# Patient Record
Sex: Male | Born: 1935 | Race: White | Hispanic: No | Marital: Married | Smoking: Former smoker
Health system: Southern US, Community
[De-identification: ages and names within clinical notes are randomized; demographics above are authoritative.]

## PROBLEM LIST (undated history)

## (undated) DIAGNOSIS — K5790 Diverticulosis of intestine, part unspecified, without perforation or abscess without bleeding: Secondary | ICD-10-CM

## (undated) DIAGNOSIS — K635 Polyp of colon: Secondary | ICD-10-CM

## (undated) DIAGNOSIS — I4891 Unspecified atrial fibrillation: Secondary | ICD-10-CM

## (undated) DIAGNOSIS — I442 Atrioventricular block, complete: Secondary | ICD-10-CM

## (undated) DIAGNOSIS — R29818 Other symptoms and signs involving the nervous system: Secondary | ICD-10-CM

## (undated) DIAGNOSIS — I6529 Occlusion and stenosis of unspecified carotid artery: Secondary | ICD-10-CM

## (undated) DIAGNOSIS — R259 Unspecified abnormal involuntary movements: Secondary | ICD-10-CM

## (undated) DIAGNOSIS — R413 Other amnesia: Secondary | ICD-10-CM

## (undated) DIAGNOSIS — I2510 Atherosclerotic heart disease of native coronary artery without angina pectoris: Secondary | ICD-10-CM

## (undated) DIAGNOSIS — K449 Diaphragmatic hernia without obstruction or gangrene: Secondary | ICD-10-CM

## (undated) DIAGNOSIS — E7800 Pure hypercholesterolemia, unspecified: Secondary | ICD-10-CM

## (undated) DIAGNOSIS — N400 Enlarged prostate without lower urinary tract symptoms: Secondary | ICD-10-CM

## (undated) DIAGNOSIS — M1990 Unspecified osteoarthritis, unspecified site: Secondary | ICD-10-CM

## (undated) DIAGNOSIS — IMO0002 Reserved for concepts with insufficient information to code with codable children: Secondary | ICD-10-CM

## (undated) DIAGNOSIS — B029 Zoster without complications: Secondary | ICD-10-CM

## (undated) DIAGNOSIS — G4733 Obstructive sleep apnea (adult) (pediatric): Secondary | ICD-10-CM

## (undated) DIAGNOSIS — K2970 Gastritis, unspecified, without bleeding: Secondary | ICD-10-CM

## (undated) DIAGNOSIS — Z950 Presence of cardiac pacemaker: Secondary | ICD-10-CM

## (undated) DIAGNOSIS — H409 Unspecified glaucoma: Secondary | ICD-10-CM

## (undated) DIAGNOSIS — I255 Ischemic cardiomyopathy: Secondary | ICD-10-CM

## (undated) DIAGNOSIS — D649 Anemia, unspecified: Secondary | ICD-10-CM

## (undated) DIAGNOSIS — J189 Pneumonia, unspecified organism: Secondary | ICD-10-CM

## (undated) DIAGNOSIS — I10 Essential (primary) hypertension: Secondary | ICD-10-CM

## (undated) DIAGNOSIS — I679 Cerebrovascular disease, unspecified: Secondary | ICD-10-CM

## (undated) DIAGNOSIS — C07 Malignant neoplasm of parotid gland: Secondary | ICD-10-CM

## (undated) DIAGNOSIS — IMO0001 Reserved for inherently not codable concepts without codable children: Secondary | ICD-10-CM

## (undated) DIAGNOSIS — F419 Anxiety disorder, unspecified: Secondary | ICD-10-CM

## (undated) HISTORY — PX: HERNIA REPAIR: SHX51

## (undated) HISTORY — DX: Other symptoms and signs involving the nervous system: R29.818

## (undated) HISTORY — DX: Unspecified atrial fibrillation: I48.91

## (undated) HISTORY — PX: PACEMAKER INSERTION: SHX728

## (undated) HISTORY — DX: Cerebrovascular disease, unspecified: I67.9

## (undated) HISTORY — DX: Reserved for inherently not codable concepts without codable children: IMO0001

## (undated) HISTORY — DX: Anxiety disorder, unspecified: F41.9

## (undated) HISTORY — PX: INSERT / REPLACE / REMOVE PACEMAKER: SUR710

## (undated) HISTORY — DX: Obstructive sleep apnea (adult) (pediatric): G47.33

## (undated) HISTORY — DX: Occlusion and stenosis of unspecified carotid artery: I65.29

## (undated) HISTORY — DX: Diaphragmatic hernia without obstruction or gangrene: K44.9

## (undated) HISTORY — DX: Zoster without complications: B02.9

## (undated) HISTORY — DX: Benign prostatic hyperplasia without lower urinary tract symptoms: N40.0

## (undated) HISTORY — DX: Pure hypercholesterolemia, unspecified: E78.00

## (undated) HISTORY — DX: Unspecified abnormal involuntary movements: R25.9

## (undated) HISTORY — PX: HIATAL HERNIA REPAIR: SHX195

## (undated) HISTORY — DX: Anemia, unspecified: D64.9

## (undated) HISTORY — DX: Reserved for concepts with insufficient information to code with codable children: IMO0002

## (undated) HISTORY — DX: Diverticulosis of intestine, part unspecified, without perforation or abscess without bleeding: K57.90

## (undated) HISTORY — DX: Atrioventricular block, complete: I44.2

## (undated) HISTORY — DX: Gastritis, unspecified, without bleeding: K29.70

## (undated) HISTORY — DX: Atherosclerotic heart disease of native coronary artery without angina pectoris: I25.10

## (undated) HISTORY — DX: Ischemic cardiomyopathy: I25.5

## (undated) HISTORY — DX: Polyp of colon: K63.5

## (undated) HISTORY — DX: Other amnesia: R41.3

## (undated) HISTORY — DX: Presence of cardiac pacemaker: Z95.0

## (undated) HISTORY — DX: Unspecified glaucoma: H40.9

## (undated) HISTORY — DX: Unspecified osteoarthritis, unspecified site: M19.90

## (undated) HISTORY — PX: ANAL FISSURE REPAIR: SHX2312

---

## 1998-11-07 DIAGNOSIS — I2510 Atherosclerotic heart disease of native coronary artery without angina pectoris: Secondary | ICD-10-CM

## 1998-11-07 HISTORY — DX: Atherosclerotic heart disease of native coronary artery without angina pectoris: I25.10

## 1999-07-09 HISTORY — PX: CORONARY ARTERY BYPASS GRAFT: SHX141

## 1999-08-03 ENCOUNTER — Encounter: Payer: Self-pay | Admitting: Cardiology

## 1999-08-04 ENCOUNTER — Inpatient Hospital Stay (HOSPITAL_COMMUNITY): Admission: AD | Admit: 1999-08-04 | Discharge: 1999-08-12 | Payer: Self-pay | Admitting: Cardiology

## 1999-08-04 ENCOUNTER — Encounter: Payer: Self-pay | Admitting: Cardiology

## 1999-08-05 ENCOUNTER — Encounter: Payer: Self-pay | Admitting: Cardiology

## 1999-08-06 ENCOUNTER — Encounter: Payer: Self-pay | Admitting: Cardiothoracic Surgery

## 1999-08-07 ENCOUNTER — Encounter: Payer: Self-pay | Admitting: Cardiothoracic Surgery

## 1999-08-08 ENCOUNTER — Encounter: Payer: Self-pay | Admitting: Cardiothoracic Surgery

## 1999-10-25 ENCOUNTER — Inpatient Hospital Stay (HOSPITAL_COMMUNITY): Admission: RE | Admit: 1999-10-25 | Discharge: 1999-10-28 | Payer: Self-pay | Admitting: Internal Medicine

## 1999-10-26 ENCOUNTER — Encounter: Payer: Self-pay | Admitting: Internal Medicine

## 2000-02-02 ENCOUNTER — Other Ambulatory Visit: Admission: RE | Admit: 2000-02-02 | Discharge: 2000-02-02 | Payer: Self-pay | Admitting: Gastroenterology

## 2000-02-02 ENCOUNTER — Encounter (INDEPENDENT_AMBULATORY_CARE_PROVIDER_SITE_OTHER): Payer: Self-pay | Admitting: Specialist

## 2000-02-07 ENCOUNTER — Ambulatory Visit (HOSPITAL_COMMUNITY): Admission: RE | Admit: 2000-02-07 | Discharge: 2000-02-07 | Payer: Self-pay | Admitting: Gastroenterology

## 2000-02-07 ENCOUNTER — Encounter: Payer: Self-pay | Admitting: Gastroenterology

## 2000-05-11 ENCOUNTER — Ambulatory Visit (HOSPITAL_COMMUNITY): Admission: RE | Admit: 2000-05-11 | Discharge: 2000-05-11 | Payer: Self-pay | Admitting: Oncology

## 2002-04-04 ENCOUNTER — Ambulatory Visit (HOSPITAL_BASED_OUTPATIENT_CLINIC_OR_DEPARTMENT_OTHER): Admission: RE | Admit: 2002-04-04 | Discharge: 2002-04-04 | Payer: Self-pay | Admitting: Pulmonary Disease

## 2003-03-20 ENCOUNTER — Ambulatory Visit (HOSPITAL_COMMUNITY): Admission: RE | Admit: 2003-03-20 | Discharge: 2003-03-20 | Payer: Self-pay | Admitting: Pulmonary Disease

## 2003-03-20 ENCOUNTER — Encounter: Payer: Self-pay | Admitting: Pulmonary Disease

## 2003-04-16 ENCOUNTER — Encounter (HOSPITAL_COMMUNITY): Admission: RE | Admit: 2003-04-16 | Discharge: 2003-05-16 | Payer: Self-pay | Admitting: Neurosurgery

## 2003-06-19 ENCOUNTER — Ambulatory Visit (HOSPITAL_COMMUNITY): Admission: RE | Admit: 2003-06-19 | Discharge: 2003-06-19 | Payer: Self-pay | Admitting: *Deleted

## 2003-08-08 HISTORY — PX: CAROTID ENDARTERECTOMY: SUR193

## 2003-08-22 ENCOUNTER — Encounter: Payer: Self-pay | Admitting: Vascular Surgery

## 2003-08-26 ENCOUNTER — Inpatient Hospital Stay (HOSPITAL_COMMUNITY): Admission: RE | Admit: 2003-08-26 | Discharge: 2003-08-27 | Payer: Self-pay | Admitting: Vascular Surgery

## 2003-08-26 ENCOUNTER — Encounter (INDEPENDENT_AMBULATORY_CARE_PROVIDER_SITE_OTHER): Payer: Self-pay | Admitting: Specialist

## 2004-09-16 ENCOUNTER — Ambulatory Visit: Payer: Self-pay | Admitting: Cardiology

## 2004-10-06 ENCOUNTER — Ambulatory Visit: Payer: Self-pay

## 2004-10-14 ENCOUNTER — Ambulatory Visit: Payer: Self-pay | Admitting: Cardiology

## 2004-10-19 ENCOUNTER — Ambulatory Visit (HOSPITAL_COMMUNITY): Admission: RE | Admit: 2004-10-19 | Discharge: 2004-10-19 | Payer: Self-pay | Admitting: Internal Medicine

## 2004-10-19 ENCOUNTER — Ambulatory Visit: Payer: Self-pay | Admitting: Internal Medicine

## 2004-10-26 ENCOUNTER — Ambulatory Visit: Payer: Self-pay | Admitting: Cardiovascular Disease

## 2004-10-26 ENCOUNTER — Ambulatory Visit: Payer: Self-pay

## 2004-10-26 ENCOUNTER — Ambulatory Visit: Payer: Self-pay | Admitting: Internal Medicine

## 2004-11-09 ENCOUNTER — Ambulatory Visit: Payer: Self-pay | Admitting: Pulmonary Disease

## 2004-11-16 ENCOUNTER — Ambulatory Visit: Payer: Self-pay | Admitting: Cardiology

## 2004-12-07 ENCOUNTER — Ambulatory Visit: Payer: Self-pay | Admitting: Cardiology

## 2004-12-10 ENCOUNTER — Ambulatory Visit: Payer: Self-pay | Admitting: Oncology

## 2004-12-15 ENCOUNTER — Ambulatory Visit: Payer: Self-pay

## 2005-01-04 ENCOUNTER — Ambulatory Visit: Payer: Self-pay | Admitting: *Deleted

## 2005-01-12 ENCOUNTER — Ambulatory Visit: Payer: Self-pay | Admitting: Internal Medicine

## 2005-01-13 ENCOUNTER — Ambulatory Visit: Payer: Self-pay | Admitting: Pulmonary Disease

## 2005-01-18 ENCOUNTER — Ambulatory Visit: Payer: Self-pay | Admitting: Pulmonary Disease

## 2005-02-01 ENCOUNTER — Ambulatory Visit: Payer: Self-pay | Admitting: Cardiology

## 2005-03-02 ENCOUNTER — Ambulatory Visit: Payer: Self-pay | Admitting: Cardiology

## 2005-03-30 ENCOUNTER — Ambulatory Visit: Payer: Self-pay | Admitting: *Deleted

## 2005-04-08 ENCOUNTER — Ambulatory Visit: Payer: Self-pay | Admitting: Oncology

## 2005-04-27 ENCOUNTER — Ambulatory Visit: Payer: Self-pay | Admitting: *Deleted

## 2005-05-19 ENCOUNTER — Ambulatory Visit: Payer: Self-pay | Admitting: Cardiology

## 2005-06-16 ENCOUNTER — Ambulatory Visit: Payer: Self-pay | Admitting: Internal Medicine

## 2005-07-07 ENCOUNTER — Ambulatory Visit: Payer: Self-pay | Admitting: Internal Medicine

## 2005-07-12 ENCOUNTER — Ambulatory Visit: Payer: Self-pay | Admitting: Pulmonary Disease

## 2005-07-21 ENCOUNTER — Ambulatory Visit: Payer: Self-pay | Admitting: Internal Medicine

## 2005-07-26 ENCOUNTER — Ambulatory Visit: Payer: Self-pay | Admitting: Gastroenterology

## 2005-08-03 ENCOUNTER — Ambulatory Visit: Payer: Self-pay | Admitting: Gastroenterology

## 2005-08-05 ENCOUNTER — Ambulatory Visit: Payer: Self-pay | Admitting: Oncology

## 2005-08-11 ENCOUNTER — Ambulatory Visit: Payer: Self-pay | Admitting: Gastroenterology

## 2005-08-11 ENCOUNTER — Ambulatory Visit (HOSPITAL_COMMUNITY): Admission: RE | Admit: 2005-08-11 | Discharge: 2005-08-11 | Payer: Self-pay | Admitting: Gastroenterology

## 2005-08-18 ENCOUNTER — Ambulatory Visit: Payer: Self-pay | Admitting: Cardiology

## 2005-08-30 ENCOUNTER — Ambulatory Visit: Payer: Self-pay | Admitting: Internal Medicine

## 2005-09-20 ENCOUNTER — Ambulatory Visit: Payer: Self-pay | Admitting: Internal Medicine

## 2005-09-27 ENCOUNTER — Ambulatory Visit (HOSPITAL_COMMUNITY): Admission: RE | Admit: 2005-09-27 | Discharge: 2005-09-27 | Payer: Self-pay | Admitting: Adult Health

## 2005-09-27 ENCOUNTER — Ambulatory Visit: Payer: Self-pay | Admitting: Pulmonary Disease

## 2005-10-11 ENCOUNTER — Ambulatory Visit: Payer: Self-pay | Admitting: Cardiology

## 2005-11-08 ENCOUNTER — Ambulatory Visit: Payer: Self-pay | Admitting: *Deleted

## 2005-12-06 ENCOUNTER — Ambulatory Visit: Payer: Self-pay | Admitting: Cardiology

## 2005-12-27 ENCOUNTER — Ambulatory Visit: Payer: Self-pay | Admitting: Cardiovascular Disease

## 2006-01-11 ENCOUNTER — Ambulatory Visit: Payer: Self-pay | Admitting: Pulmonary Disease

## 2006-01-12 ENCOUNTER — Ambulatory Visit: Payer: Self-pay | Admitting: Internal Medicine

## 2006-01-24 ENCOUNTER — Ambulatory Visit: Payer: Self-pay | Admitting: Cardiology

## 2006-02-03 ENCOUNTER — Ambulatory Visit: Payer: Self-pay | Admitting: Oncology

## 2006-02-08 ENCOUNTER — Ambulatory Visit: Payer: Self-pay | Admitting: Cardiology

## 2006-02-13 ENCOUNTER — Ambulatory Visit: Payer: Self-pay | Admitting: Gastroenterology

## 2006-02-16 ENCOUNTER — Ambulatory Visit: Admission: RE | Admit: 2006-02-16 | Discharge: 2006-02-16 | Payer: Self-pay | Admitting: Gastroenterology

## 2006-02-21 ENCOUNTER — Ambulatory Visit: Payer: Self-pay | Admitting: Cardiology

## 2006-03-09 ENCOUNTER — Encounter (INDEPENDENT_AMBULATORY_CARE_PROVIDER_SITE_OTHER): Payer: Self-pay | Admitting: *Deleted

## 2006-03-09 ENCOUNTER — Ambulatory Visit (HOSPITAL_COMMUNITY): Admission: RE | Admit: 2006-03-09 | Discharge: 2006-03-09 | Payer: Self-pay | Admitting: Gastroenterology

## 2006-03-15 ENCOUNTER — Ambulatory Visit: Payer: Self-pay | Admitting: Gastroenterology

## 2006-03-21 ENCOUNTER — Ambulatory Visit: Payer: Self-pay | Admitting: Cardiology

## 2006-03-28 ENCOUNTER — Ambulatory Visit: Payer: Self-pay | Admitting: *Deleted

## 2006-04-04 ENCOUNTER — Ambulatory Visit: Payer: Self-pay | Admitting: Gastroenterology

## 2006-04-07 ENCOUNTER — Ambulatory Visit: Payer: Self-pay | Admitting: Cardiology

## 2006-04-14 ENCOUNTER — Ambulatory Visit: Payer: Self-pay | Admitting: Internal Medicine

## 2006-04-28 ENCOUNTER — Ambulatory Visit: Payer: Self-pay | Admitting: Cardiovascular Disease

## 2006-05-17 ENCOUNTER — Ambulatory Visit: Payer: Self-pay | Admitting: Pulmonary Disease

## 2006-05-26 ENCOUNTER — Ambulatory Visit: Payer: Self-pay | Admitting: Cardiology

## 2006-06-07 ENCOUNTER — Ambulatory Visit: Payer: Self-pay | Admitting: Internal Medicine

## 2006-06-15 ENCOUNTER — Ambulatory Visit: Payer: Self-pay

## 2006-06-23 ENCOUNTER — Ambulatory Visit: Payer: Self-pay | Admitting: *Deleted

## 2006-06-28 ENCOUNTER — Ambulatory Visit (HOSPITAL_COMMUNITY): Admission: RE | Admit: 2006-06-28 | Discharge: 2006-06-28 | Payer: Self-pay | Admitting: Internal Medicine

## 2006-06-28 ENCOUNTER — Ambulatory Visit: Payer: Self-pay | Admitting: Internal Medicine

## 2006-07-07 ENCOUNTER — Ambulatory Visit: Payer: Self-pay

## 2006-07-27 ENCOUNTER — Ambulatory Visit: Payer: Self-pay | Admitting: Oncology

## 2006-07-28 ENCOUNTER — Ambulatory Visit: Payer: Self-pay | Admitting: Cardiology

## 2006-07-31 LAB — CBC WITH DIFFERENTIAL/PLATELET
BASO%: 0.4 % (ref 0.0–2.0)
LYMPH%: 25.9 % (ref 14.0–48.0)
MCHC: 34.5 g/dL (ref 32.0–35.9)
MONO#: 0.4 10*3/uL (ref 0.1–0.9)
MONO%: 7.3 % (ref 0.0–13.0)
NEUT#: 3.5 10*3/uL (ref 1.5–6.5)
Platelets: 179 10*3/uL (ref 145–400)
RBC: 4.49 10*6/uL (ref 4.20–5.71)
RDW: 12.8 % (ref 11.2–14.6)
WBC: 5.4 10*3/uL (ref 4.0–10.0)

## 2006-07-31 LAB — FERRITIN: Ferritin: 244 ng/mL (ref 22–322)

## 2006-07-31 LAB — IRON AND TIBC
Iron: 119 ug/dL (ref 42–165)
TIBC: 314 ug/dL (ref 215–435)

## 2006-08-28 ENCOUNTER — Ambulatory Visit: Payer: Self-pay | Admitting: Cardiology

## 2006-09-15 ENCOUNTER — Ambulatory Visit: Payer: Self-pay | Admitting: Cardiology

## 2006-09-20 ENCOUNTER — Ambulatory Visit: Payer: Self-pay | Admitting: Pulmonary Disease

## 2006-10-05 ENCOUNTER — Ambulatory Visit: Payer: Self-pay | Admitting: Pulmonary Disease

## 2006-10-11 ENCOUNTER — Ambulatory Visit: Payer: Self-pay | Admitting: Pulmonary Disease

## 2006-11-06 ENCOUNTER — Ambulatory Visit: Payer: Self-pay | Admitting: Internal Medicine

## 2007-01-01 ENCOUNTER — Ambulatory Visit: Payer: Self-pay | Admitting: Internal Medicine

## 2007-01-19 ENCOUNTER — Ambulatory Visit: Payer: Self-pay | Admitting: Pulmonary Disease

## 2007-01-19 LAB — CONVERTED CEMR LAB
Albumin: 3.9 g/dL (ref 3.5–5.2)
Alkaline Phosphatase: 91 units/L (ref 39–117)
BUN: 14 mg/dL (ref 6–23)
Basophils Absolute: 0 10*3/uL (ref 0.0–0.1)
Cholesterol: 175 mg/dL (ref 0–200)
GFR calc Af Amer: 106 mL/min
GFR calc non Af Amer: 88 mL/min
HDL: 44 mg/dL (ref >39.0)
Hemoglobin: 14.5 g/dL (ref 13.0–17.0)
LDL Cholesterol: 95 mg/dL (ref 0–99)
Lymphocytes Relative: 30.4 % (ref 12.0–46.0)
MCHC: 33.6 g/dL (ref 30.0–36.0)
Monocytes Absolute: 0.7 10*3/uL (ref 0.2–0.7)
Monocytes Relative: 9.7 % (ref 3.0–11.0)
Neutro Abs: 3.9 10*3/uL (ref 1.4–7.7)
Potassium: 4.1 meq/L (ref 3.5–5.1)
Triglycerides: 182 mg/dL — ABNORMAL HIGH (ref 0–149)

## 2007-02-07 ENCOUNTER — Ambulatory Visit: Payer: Self-pay | Admitting: Oncology

## 2007-02-12 LAB — CBC WITH DIFFERENTIAL/PLATELET
BASO%: 1 % (ref 0.0–2.0)
Basophils Absolute: 0.1 10*3/uL (ref 0.0–0.1)
EOS%: 1.3 % (ref 0.0–7.0)
HCT: 43.2 % (ref 38.7–49.9)
HGB: 15 g/dL (ref 13.0–17.1)
LYMPH%: 28.8 % (ref 14.0–48.0)
MCH: 33.1 pg (ref 28.0–33.4)
MCHC: 34.8 g/dL (ref 32.0–35.9)
MCV: 95 fL (ref 81.6–98.0)
MONO%: 8.6 % (ref 0.0–13.0)
NEUT%: 60.3 % (ref 40.0–75.0)

## 2007-02-12 LAB — COMPREHENSIVE METABOLIC PANEL
ALT: 18 U/L (ref 0–53)
AST: 19 U/L (ref 0–37)
CO2: 26 mEq/L (ref 19–32)
Chloride: 102 mEq/L (ref 96–112)
Creatinine, Ser: 0.93 mg/dL (ref 0.40–1.50)
Sodium: 140 mEq/L (ref 135–145)
Total Bilirubin: 0.6 mg/dL (ref 0.3–1.2)
Total Protein: 6.8 g/dL (ref 6.0–8.3)

## 2007-02-12 LAB — IRON AND TIBC: %SAT: 47 % (ref 20–55)

## 2007-02-12 LAB — FERRITIN: Ferritin: 175 ng/mL (ref 22–322)

## 2007-02-16 ENCOUNTER — Ambulatory Visit: Payer: Self-pay | Admitting: Cardiology

## 2007-02-23 ENCOUNTER — Ambulatory Visit: Payer: Self-pay | Admitting: Internal Medicine

## 2007-04-09 ENCOUNTER — Ambulatory Visit: Payer: Self-pay | Admitting: Gastroenterology

## 2007-04-10 ENCOUNTER — Ambulatory Visit: Payer: Self-pay | Admitting: Gastroenterology

## 2007-04-15 ENCOUNTER — Emergency Department (HOSPITAL_COMMUNITY): Admission: EM | Admit: 2007-04-15 | Discharge: 2007-04-15 | Payer: Self-pay | Admitting: Emergency Medicine

## 2007-04-23 ENCOUNTER — Ambulatory Visit: Payer: Self-pay | Admitting: Internal Medicine

## 2007-04-30 ENCOUNTER — Ambulatory Visit: Payer: Self-pay | Admitting: Gastroenterology

## 2007-05-09 ENCOUNTER — Ambulatory Visit (HOSPITAL_COMMUNITY): Admission: RE | Admit: 2007-05-09 | Discharge: 2007-05-09 | Payer: Self-pay | Admitting: Gastroenterology

## 2007-05-15 ENCOUNTER — Ambulatory Visit: Payer: Self-pay | Admitting: Gastroenterology

## 2007-05-23 ENCOUNTER — Ambulatory Visit: Payer: Self-pay | Admitting: Pulmonary Disease

## 2007-05-23 LAB — CONVERTED CEMR LAB
AST: 25 units/L (ref 0–37)
Bilirubin, Direct: 0.1 mg/dL (ref 0.0–0.3)
Chloride: 103 meq/L (ref 96–112)
Cholesterol: 143 mg/dL (ref 0–200)
GFR calc Af Amer: 122 mL/min
GFR calc non Af Amer: 101 mL/min
Glucose, Bld: 113 mg/dL — ABNORMAL HIGH (ref 70–99)
HDL: 39.8 mg/dL (ref >39.0)
LDL Cholesterol: 69 mg/dL (ref 0–99)
Sodium: 141 meq/L (ref 135–145)
Total CHOL/HDL Ratio: 3.6
Triglycerides: 173 mg/dL — ABNORMAL HIGH (ref 0–149)

## 2007-05-28 ENCOUNTER — Ambulatory Visit: Payer: Self-pay | Admitting: Internal Medicine

## 2007-06-05 ENCOUNTER — Ambulatory Visit: Payer: Self-pay | Admitting: Gastroenterology

## 2007-06-12 ENCOUNTER — Ambulatory Visit: Payer: Self-pay | Admitting: Cardiology

## 2007-06-18 ENCOUNTER — Ambulatory Visit: Payer: Self-pay | Admitting: Internal Medicine

## 2007-08-09 ENCOUNTER — Ambulatory Visit: Payer: Self-pay | Admitting: Oncology

## 2007-08-13 ENCOUNTER — Ambulatory Visit: Payer: Self-pay | Admitting: Internal Medicine

## 2007-08-13 LAB — IRON AND TIBC
%SAT: 50 % (ref 20–55)
TIBC: 315 ug/dL (ref 215–435)
UIBC: 159 ug/dL

## 2007-08-13 LAB — FOLATE: Folate: >20 ng/mL

## 2007-08-13 LAB — CBC WITH DIFFERENTIAL/PLATELET
Basophils Absolute: 0 10*3/uL (ref 0.0–0.1)
EOS%: 1.1 % (ref 0.0–7.0)
Eosinophils Absolute: 0.1 10*3/uL (ref 0.0–0.5)
HCT: 40.7 % (ref 38.7–49.9)
HGB: 14.3 g/dL (ref 13.0–17.1)
MONO#: 0.6 10*3/uL (ref 0.1–0.9)
NEUT#: 3.9 10*3/uL (ref 1.5–6.5)
NEUT%: 60.3 % (ref 40.0–75.0)
RDW: 10.8 % — ABNORMAL LOW (ref 11.2–14.6)
WBC: 6.4 10*3/uL (ref 4.0–10.0)
lymph#: 1.9 10*3/uL (ref 0.9–3.3)

## 2007-08-13 LAB — FERRITIN: Ferritin: 192 ng/mL (ref 22–322)

## 2007-09-10 ENCOUNTER — Ambulatory Visit: Payer: Self-pay | Admitting: Pulmonary Disease

## 2007-09-10 LAB — CONVERTED CEMR LAB
Bilirubin Urine: NEGATIVE
Specific Gravity, Urine: 1.01 (ref 1.000–1.03)
Total Protein, Urine: 30 mg/dL — AB
Urine Glucose: NEGATIVE mg/dL
pH: 6.5 (ref 5.0–8.0)

## 2007-09-11 ENCOUNTER — Ambulatory Visit: Payer: Self-pay | Admitting: Cardiology

## 2007-09-24 DIAGNOSIS — I2510 Atherosclerotic heart disease of native coronary artery without angina pectoris: Secondary | ICD-10-CM | POA: Insufficient documentation

## 2007-09-24 DIAGNOSIS — M1990 Unspecified osteoarthritis, unspecified site: Secondary | ICD-10-CM | POA: Insufficient documentation

## 2007-09-24 DIAGNOSIS — E7800 Pure hypercholesterolemia, unspecified: Secondary | ICD-10-CM

## 2007-09-24 DIAGNOSIS — G4733 Obstructive sleep apnea (adult) (pediatric): Secondary | ICD-10-CM

## 2007-09-24 DIAGNOSIS — F411 Generalized anxiety disorder: Secondary | ICD-10-CM | POA: Insufficient documentation

## 2007-09-25 ENCOUNTER — Ambulatory Visit: Payer: Self-pay | Admitting: Pulmonary Disease

## 2007-10-08 ENCOUNTER — Ambulatory Visit: Payer: Self-pay | Admitting: Internal Medicine

## 2007-10-17 ENCOUNTER — Ambulatory Visit: Payer: Self-pay | Admitting: Internal Medicine

## 2007-10-17 ENCOUNTER — Ambulatory Visit: Payer: Self-pay | Admitting: Cardiovascular Disease

## 2007-10-23 ENCOUNTER — Ambulatory Visit: Payer: Self-pay | Admitting: Internal Medicine

## 2007-11-05 ENCOUNTER — Ambulatory Visit: Payer: Self-pay | Admitting: Internal Medicine

## 2007-11-06 ENCOUNTER — Ambulatory Visit: Payer: Self-pay | Admitting: Internal Medicine

## 2007-11-06 ENCOUNTER — Ambulatory Visit: Payer: Self-pay | Admitting: Pulmonary Disease

## 2007-11-07 ENCOUNTER — Ambulatory Visit: Payer: Self-pay | Admitting: Oncology

## 2007-11-08 DIAGNOSIS — K2990 Gastroduodenitis, unspecified, without bleeding: Secondary | ICD-10-CM

## 2007-11-08 DIAGNOSIS — D649 Anemia, unspecified: Secondary | ICD-10-CM

## 2007-11-08 DIAGNOSIS — K2970 Gastritis, unspecified, without bleeding: Secondary | ICD-10-CM | POA: Insufficient documentation

## 2007-11-08 DIAGNOSIS — K5730 Diverticulosis of large intestine without perforation or abscess without bleeding: Secondary | ICD-10-CM | POA: Insufficient documentation

## 2007-11-08 DIAGNOSIS — D126 Benign neoplasm of colon, unspecified: Secondary | ICD-10-CM | POA: Insufficient documentation

## 2007-11-12 LAB — CBC WITH DIFFERENTIAL/PLATELET
BASO%: 0.5 % (ref 0.0–2.0)
EOS%: 1.8 % (ref 0.0–7.0)
HCT: 43.5 % (ref 38.7–49.9)
LYMPH%: 31.6 % (ref 14.0–48.0)
MCH: 32.5 pg (ref 28.0–33.4)
MCHC: 34.2 g/dL (ref 32.0–35.9)
MONO#: 0.6 10*3/uL (ref 0.1–0.9)
NEUT%: 58.7 % (ref 40.0–75.0)
RBC: 4.58 10*6/uL (ref 4.20–5.71)
WBC: 7.7 10*3/uL (ref 4.0–10.0)
lymph#: 2.4 10*3/uL (ref 0.9–3.3)

## 2007-11-12 LAB — FERRITIN: Ferritin: 156 ng/mL (ref 22–322)

## 2007-11-15 ENCOUNTER — Encounter: Payer: Self-pay | Admitting: Pulmonary Disease

## 2007-11-20 ENCOUNTER — Ambulatory Visit: Payer: Self-pay | Admitting: Cardiovascular Disease

## 2007-11-22 ENCOUNTER — Ambulatory Visit: Payer: Self-pay | Admitting: Pulmonary Disease

## 2007-11-26 ENCOUNTER — Telehealth (INDEPENDENT_AMBULATORY_CARE_PROVIDER_SITE_OTHER): Payer: Self-pay | Admitting: *Deleted

## 2007-11-30 ENCOUNTER — Encounter: Payer: Self-pay | Admitting: Pulmonary Disease

## 2007-12-04 ENCOUNTER — Ambulatory Visit: Payer: Self-pay | Admitting: Vascular Surgery

## 2007-12-09 DIAGNOSIS — K449 Diaphragmatic hernia without obstruction or gangrene: Secondary | ICD-10-CM | POA: Insufficient documentation

## 2007-12-09 LAB — CONVERTED CEMR LAB
AST: 26 units/L (ref 0–37)
Bilirubin, Direct: 0.2 mg/dL (ref 0.0–0.3)
CO2: 32 meq/L (ref 19–32)
Eosinophils Absolute: 0.1 10*3/uL (ref 0.0–0.6)
Eosinophils Relative: 1.6 % (ref 0.0–5.0)
GFR calc Af Amer: 106 mL/min
GFR calc non Af Amer: 88 mL/min
Glucose, Bld: 116 mg/dL — ABNORMAL HIGH (ref 70–99)
HCT: 45.6 % (ref 39.0–52.0)
Hemoglobin: 15.4 g/dL (ref 13.0–17.0)
Lymphocytes Relative: 30.7 % (ref 12.0–46.0)
MCV: 97.9 fL (ref 78.0–100.0)
Neutro Abs: 4.2 10*3/uL (ref 1.4–7.7)
Neutrophils Relative %: 57.7 % (ref 43.0–77.0)
PSA: 1.78 ng/mL (ref 0.10–4.00)
Sodium: 139 meq/L (ref 135–145)
Total Protein: 7.6 g/dL (ref 6.0–8.3)
WBC: 7.4 10*3/uL (ref 4.5–10.5)

## 2007-12-18 ENCOUNTER — Ambulatory Visit: Payer: Self-pay | Admitting: Cardiovascular Disease

## 2007-12-21 ENCOUNTER — Ambulatory Visit: Payer: Self-pay | Admitting: Cardiology

## 2007-12-25 ENCOUNTER — Ambulatory Visit: Payer: Self-pay

## 2008-01-15 ENCOUNTER — Ambulatory Visit: Payer: Self-pay | Admitting: Cardiology

## 2008-01-31 ENCOUNTER — Ambulatory Visit: Payer: Self-pay | Admitting: Oncology

## 2008-02-04 LAB — IRON AND TIBC
Iron: 84 ug/dL (ref 42–165)
TIBC: 289 ug/dL (ref 215–435)
UIBC: 205 ug/dL

## 2008-02-04 LAB — CBC WITH DIFFERENTIAL/PLATELET
Basophils Absolute: 0 10*3/uL (ref 0.0–0.1)
EOS%: 1.4 % (ref 0.0–7.0)
HGB: 15.5 g/dL (ref 13.0–17.1)
MCH: 32.6 pg (ref 28.0–33.4)
MCHC: 35.1 g/dL (ref 32.0–35.9)
MCV: 92.9 fL (ref 81.6–98.0)
MONO%: 11 % (ref 0.0–13.0)
RBC: 4.76 10*6/uL (ref 4.20–5.71)
RDW: 13.3 % (ref 11.2–14.6)

## 2008-02-05 ENCOUNTER — Ambulatory Visit: Payer: Self-pay | Admitting: Internal Medicine

## 2008-02-08 ENCOUNTER — Inpatient Hospital Stay (HOSPITAL_COMMUNITY): Admission: EM | Admit: 2008-02-08 | Discharge: 2008-02-09 | Payer: Self-pay | Admitting: Orthopedic Surgery

## 2008-02-09 ENCOUNTER — Encounter: Payer: Self-pay | Admitting: Pulmonary Disease

## 2008-02-09 ENCOUNTER — Encounter: Payer: Self-pay | Admitting: Internal Medicine

## 2008-02-11 ENCOUNTER — Encounter: Payer: Self-pay | Admitting: Pulmonary Disease

## 2008-02-11 ENCOUNTER — Ambulatory Visit: Payer: Self-pay | Admitting: Gastroenterology

## 2008-02-13 ENCOUNTER — Ambulatory Visit (HOSPITAL_COMMUNITY): Admission: RE | Admit: 2008-02-13 | Discharge: 2008-02-13 | Payer: Self-pay | Admitting: Gastroenterology

## 2008-02-13 ENCOUNTER — Encounter: Payer: Self-pay | Admitting: Gastroenterology

## 2008-02-14 ENCOUNTER — Ambulatory Visit: Payer: Self-pay | Admitting: Internal Medicine

## 2008-02-22 ENCOUNTER — Encounter: Payer: Self-pay | Admitting: Pulmonary Disease

## 2008-02-22 ENCOUNTER — Ambulatory Visit: Payer: Self-pay | Admitting: Internal Medicine

## 2008-02-26 ENCOUNTER — Ambulatory Visit: Payer: Self-pay | Admitting: Gastroenterology

## 2008-02-29 ENCOUNTER — Encounter: Payer: Self-pay | Admitting: Gastroenterology

## 2008-02-29 ENCOUNTER — Ambulatory Visit (HOSPITAL_COMMUNITY): Admission: RE | Admit: 2008-02-29 | Discharge: 2008-02-29 | Payer: Self-pay | Admitting: Gastroenterology

## 2008-03-03 ENCOUNTER — Ambulatory Visit: Payer: Self-pay | Admitting: Internal Medicine

## 2008-03-04 ENCOUNTER — Ambulatory Visit: Payer: Self-pay | Admitting: Cardiology

## 2008-03-12 ENCOUNTER — Ambulatory Visit: Payer: Self-pay | Admitting: Internal Medicine

## 2008-04-01 ENCOUNTER — Ambulatory Visit: Payer: Self-pay | Admitting: Cardiology

## 2008-04-15 ENCOUNTER — Ambulatory Visit: Payer: Self-pay | Admitting: Internal Medicine

## 2008-05-05 ENCOUNTER — Telehealth (INDEPENDENT_AMBULATORY_CARE_PROVIDER_SITE_OTHER): Payer: Self-pay | Admitting: *Deleted

## 2008-05-08 ENCOUNTER — Ambulatory Visit: Payer: Self-pay | Admitting: Oncology

## 2008-05-08 ENCOUNTER — Ambulatory Visit: Payer: Self-pay | Admitting: Cardiology

## 2008-05-08 ENCOUNTER — Ambulatory Visit: Payer: Self-pay

## 2008-05-13 ENCOUNTER — Encounter: Payer: Self-pay | Admitting: Pulmonary Disease

## 2008-05-13 LAB — IRON AND TIBC
%SAT: 38 % (ref 20–55)
Iron: 116 ug/dL (ref 42–165)
TIBC: 303 ug/dL (ref 215–435)
UIBC: 187 ug/dL

## 2008-05-13 LAB — CBC WITH DIFFERENTIAL/PLATELET
BASO%: 0.7 % (ref 0.0–2.0)
MCHC: 34.5 g/dL (ref 32.0–35.9)
MONO#: 0.6 10*3/uL (ref 0.1–0.9)
NEUT#: 4.3 10*3/uL (ref 1.5–6.5)
RBC: 4.65 10*6/uL (ref 4.20–5.71)
RDW: 12.1 % (ref 11.2–14.6)
WBC: 6.9 10*3/uL (ref 4.0–10.0)
lymph#: 1.8 10*3/uL (ref 0.9–3.3)

## 2008-05-20 ENCOUNTER — Ambulatory Visit: Payer: Self-pay | Admitting: Vascular Surgery

## 2008-05-21 ENCOUNTER — Ambulatory Visit: Payer: Self-pay | Admitting: Pulmonary Disease

## 2008-05-25 DIAGNOSIS — I679 Cerebrovascular disease, unspecified: Secondary | ICD-10-CM

## 2008-05-25 LAB — CONVERTED CEMR LAB
ALT: 19 units/L (ref 0–53)
Albumin: 3.9 g/dL (ref 3.5–5.2)
BUN: 11 mg/dL (ref 6–23)
Basophils Relative: 0.8 % (ref 0.0–1.0)
CO2: 31 meq/L (ref 19–32)
Calcium: 9.4 mg/dL (ref 8.4–10.5)
Cholesterol: 120 mg/dL (ref 0–200)
Creatinine, Ser: 0.9 mg/dL (ref 0.4–1.5)
GFR calc Af Amer: 106 mL/min
Glucose, Bld: 117 mg/dL — ABNORMAL HIGH (ref 70–99)
HCT: 45 % (ref 39.0–52.0)
Hemoglobin: 15.1 g/dL (ref 13.0–17.0)
Lymphocytes Relative: 25.1 % (ref 12.0–46.0)
Monocytes Absolute: 0.5 10*3/uL (ref 0.1–1.0)
Monocytes Relative: 7.4 % (ref 3.0–12.0)
Neutro Abs: 4.5 10*3/uL (ref 1.4–7.7)
PSA: 1.76 ng/mL (ref 0.10–4.00)
RBC: 4.69 M/uL (ref 4.22–5.81)
RDW: 13 % (ref 11.5–14.6)
Sodium: 141 meq/L (ref 135–145)
TSH: 1.44 microintl units/mL (ref 0.35–5.50)
Total CHOL/HDL Ratio: 2.9
Total Protein: 7 g/dL (ref 6.0–8.3)
Triglycerides: 150 mg/dL — ABNORMAL HIGH (ref 0–149)

## 2008-05-27 ENCOUNTER — Ambulatory Visit (HOSPITAL_COMMUNITY): Admission: RE | Admit: 2008-05-27 | Discharge: 2008-05-27 | Payer: Self-pay | Admitting: Surgery

## 2008-05-29 ENCOUNTER — Ambulatory Visit: Payer: Self-pay | Admitting: Cardiology

## 2008-06-23 ENCOUNTER — Ambulatory Visit: Payer: Self-pay | Admitting: Cardiology

## 2008-06-26 ENCOUNTER — Inpatient Hospital Stay (HOSPITAL_COMMUNITY): Admission: RE | Admit: 2008-06-26 | Discharge: 2008-06-29 | Payer: Self-pay | Admitting: Surgery

## 2008-07-08 ENCOUNTER — Ambulatory Visit: Payer: Self-pay | Admitting: Cardiology

## 2008-07-09 ENCOUNTER — Encounter: Payer: Self-pay | Admitting: Pulmonary Disease

## 2008-07-29 ENCOUNTER — Ambulatory Visit: Payer: Self-pay | Admitting: Cardiology

## 2008-07-31 ENCOUNTER — Ambulatory Visit: Payer: Self-pay | Admitting: Oncology

## 2008-08-07 ENCOUNTER — Encounter: Payer: Self-pay | Admitting: Pulmonary Disease

## 2008-08-07 LAB — FERRITIN: Ferritin: 127 ng/mL (ref 22–322)

## 2008-08-07 LAB — CBC WITH DIFFERENTIAL/PLATELET
Basophils Absolute: 0 10*3/uL (ref 0.0–0.1)
HCT: 41 % (ref 38.7–49.9)
HGB: 14 g/dL (ref 13.0–17.1)
MONO#: 0.6 10*3/uL (ref 0.1–0.9)
NEUT#: 5.4 10*3/uL (ref 1.5–6.5)
NEUT%: 69.5 % (ref 40.0–75.0)
WBC: 7.7 10*3/uL (ref 4.0–10.0)
lymph#: 1.7 10*3/uL (ref 0.9–3.3)

## 2008-08-07 LAB — IRON AND TIBC
%SAT: 41 % (ref 20–55)
TIBC: 282 ug/dL (ref 215–435)
UIBC: 167 ug/dL

## 2008-08-12 ENCOUNTER — Encounter: Payer: Self-pay | Admitting: Pulmonary Disease

## 2008-08-12 ENCOUNTER — Ambulatory Visit: Payer: Self-pay | Admitting: Internal Medicine

## 2008-08-15 ENCOUNTER — Encounter: Payer: Self-pay | Admitting: Pulmonary Disease

## 2008-08-15 ENCOUNTER — Encounter: Payer: Self-pay | Admitting: Gastroenterology

## 2008-08-26 ENCOUNTER — Ambulatory Visit: Payer: Self-pay | Admitting: Internal Medicine

## 2008-09-23 ENCOUNTER — Ambulatory Visit: Payer: Self-pay | Admitting: Cardiovascular Disease

## 2008-10-14 ENCOUNTER — Ambulatory Visit: Payer: Self-pay | Admitting: Cardiology

## 2008-10-14 LAB — CONVERTED CEMR LAB: Prothrombin Time: 77.2 s (ref 10.9–13.3)

## 2008-10-17 ENCOUNTER — Ambulatory Visit: Payer: Self-pay | Admitting: Cardiovascular Disease

## 2008-10-27 ENCOUNTER — Ambulatory Visit: Payer: Self-pay | Admitting: Cardiology

## 2008-11-06 ENCOUNTER — Ambulatory Visit: Payer: Self-pay | Admitting: Oncology

## 2008-11-11 ENCOUNTER — Encounter: Payer: Self-pay | Admitting: Pulmonary Disease

## 2008-11-11 ENCOUNTER — Ambulatory Visit: Payer: Self-pay | Admitting: Internal Medicine

## 2008-11-11 LAB — CBC WITH DIFFERENTIAL/PLATELET
Eosinophils Absolute: 0.1 10*3/uL (ref 0.0–0.5)
MONO#: 0.5 10*3/uL (ref 0.1–0.9)
NEUT#: 4.5 10*3/uL (ref 1.5–6.5)
Platelets: 187 10*3/uL (ref 145–400)
RBC: 4.6 10*6/uL (ref 4.20–5.71)
RDW: 13.2 % (ref 11.2–14.6)
WBC: 7.1 10*3/uL (ref 4.0–10.0)
lymph#: 2 10*3/uL (ref 0.9–3.3)

## 2008-11-11 LAB — FERRITIN: Ferritin: 93 ng/mL (ref 22–322)

## 2008-11-17 ENCOUNTER — Ambulatory Visit: Payer: Self-pay | Admitting: Cardiology

## 2008-11-20 ENCOUNTER — Ambulatory Visit: Payer: Self-pay | Admitting: Pulmonary Disease

## 2008-11-25 ENCOUNTER — Ambulatory Visit: Payer: Self-pay | Admitting: Vascular Surgery

## 2008-11-27 LAB — CONVERTED CEMR LAB
ALT: 20 U/L
AST: 27 U/L
Albumin: 4.1 g/dL
Alkaline Phosphatase: 83 U/L
BUN: 15 mg/dL
Bilirubin, Direct: 0.2 mg/dL
CO2: 32 meq/L
Calcium: 9.6 mg/dL
Chloride: 103 meq/L
Cholesterol: 149 mg/dL
Creatinine, Ser: 1 mg/dL
GFR calc Af Amer: 94 mL/min
GFR calc non Af Amer: 77 mL/min
Glucose, Bld: 102 mg/dL — ABNORMAL HIGH
HDL: 43.6 mg/dL
LDL Cholesterol: 77 mg/dL
PSA: 2.27 ng/mL
Potassium: 4.8 meq/L
Sodium: 141 meq/L
TSH: 1.84 u[IU]/mL
Total Bilirubin: 1.1 mg/dL
Total CHOL/HDL Ratio: 3.4
Total Protein: 7.8 g/dL
Triglycerides: 143 mg/dL
VLDL: 29 mg/dL

## 2008-12-15 ENCOUNTER — Ambulatory Visit: Payer: Self-pay | Admitting: Cardiology

## 2008-12-17 ENCOUNTER — Ambulatory Visit: Payer: Self-pay | Admitting: Pulmonary Disease

## 2008-12-17 LAB — CONVERTED CEMR LAB
Fecal Occult Blood: NEGATIVE
OCCULT 2: NEGATIVE
OCCULT 3: NEGATIVE
OCCULT 4: NEGATIVE

## 2008-12-29 ENCOUNTER — Ambulatory Visit: Payer: Self-pay | Admitting: Cardiology

## 2008-12-29 ENCOUNTER — Encounter: Payer: Self-pay | Admitting: Cardiology

## 2008-12-29 LAB — CONVERTED CEMR LAB
Chloride: 102 meq/L (ref 96–112)
Eosinophils Absolute: 0.1 10*3/uL (ref 0.0–0.7)
GFR calc Af Amer: 106 mL/min
GFR calc non Af Amer: 87 mL/min
HCT: 42.9 % (ref 39.0–52.0)
MCV: 94.3 fL (ref 78.0–100.0)
Monocytes Absolute: 0.6 10*3/uL (ref 0.1–1.0)
Neutrophils Relative %: 60.7 % (ref 43.0–77.0)
Platelets: 176 10*3/uL (ref 150–400)
Potassium: 4 meq/L (ref 3.5–5.1)
RDW: 12.6 % (ref 11.5–14.6)
Sodium: 139 meq/L (ref 135–145)
WBC: 7.5 10*3/uL (ref 4.5–10.5)

## 2008-12-31 ENCOUNTER — Encounter: Payer: Self-pay | Admitting: Pulmonary Disease

## 2009-01-12 ENCOUNTER — Ambulatory Visit: Payer: Self-pay | Admitting: Cardiology

## 2009-01-26 ENCOUNTER — Ambulatory Visit: Payer: Self-pay | Admitting: Cardiology

## 2009-02-05 ENCOUNTER — Ambulatory Visit: Payer: Self-pay | Admitting: Oncology

## 2009-02-10 ENCOUNTER — Encounter: Payer: Self-pay | Admitting: Pulmonary Disease

## 2009-02-10 ENCOUNTER — Ambulatory Visit: Payer: Self-pay | Admitting: Internal Medicine

## 2009-02-10 LAB — CBC WITH DIFFERENTIAL/PLATELET
BASO%: 0.5 % (ref 0.0–2.0)
HCT: 42.7 % (ref 38.4–49.9)
LYMPH%: 30.2 % (ref 14.0–49.0)
MCH: 32.5 pg (ref 27.2–33.4)
MCHC: 34.1 g/dL (ref 32.0–36.0)
MCV: 95.3 fL (ref 79.3–98.0)
MONO#: 0.5 10*3/uL (ref 0.1–0.9)
NEUT%: 60.5 % (ref 39.0–75.0)
Platelets: 183 10*3/uL (ref 140–400)
WBC: 6.8 10*3/uL (ref 4.0–10.3)

## 2009-02-10 LAB — IRON AND TIBC
%SAT: 35 % (ref 20–55)
TIBC: 283 ug/dL (ref 215–435)

## 2009-02-11 ENCOUNTER — Encounter: Payer: Self-pay | Admitting: Pulmonary Disease

## 2009-02-12 ENCOUNTER — Encounter: Payer: Self-pay | Admitting: Pulmonary Disease

## 2009-02-12 ENCOUNTER — Encounter: Payer: Self-pay | Admitting: Cardiology

## 2009-02-20 ENCOUNTER — Ambulatory Visit: Payer: Self-pay | Admitting: Internal Medicine

## 2009-03-20 ENCOUNTER — Ambulatory Visit: Payer: Self-pay | Admitting: Cardiology

## 2009-03-26 ENCOUNTER — Encounter: Payer: Self-pay | Admitting: Pulmonary Disease

## 2009-04-07 ENCOUNTER — Encounter: Payer: Self-pay | Admitting: *Deleted

## 2009-04-17 ENCOUNTER — Ambulatory Visit: Payer: Self-pay | Admitting: Cardiovascular Disease

## 2009-04-17 LAB — CONVERTED CEMR LAB
POC INR: 2.4
Protime: 18.8

## 2009-05-06 ENCOUNTER — Ambulatory Visit: Payer: Self-pay | Admitting: Oncology

## 2009-05-12 ENCOUNTER — Ambulatory Visit: Payer: Self-pay | Admitting: Cardiology

## 2009-05-12 ENCOUNTER — Ambulatory Visit: Payer: Self-pay | Admitting: Internal Medicine

## 2009-05-12 ENCOUNTER — Encounter: Payer: Self-pay | Admitting: Internal Medicine

## 2009-05-12 DIAGNOSIS — I442 Atrioventricular block, complete: Secondary | ICD-10-CM

## 2009-05-12 DIAGNOSIS — I4891 Unspecified atrial fibrillation: Secondary | ICD-10-CM

## 2009-05-12 LAB — CONVERTED CEMR LAB
POC INR: 3.1
Prothrombin Time: 21.4 s

## 2009-05-13 ENCOUNTER — Encounter: Payer: Self-pay | Admitting: *Deleted

## 2009-05-19 ENCOUNTER — Encounter: Payer: Self-pay | Admitting: Pulmonary Disease

## 2009-05-19 LAB — CBC & DIFF AND RETIC
Eosinophils Absolute: 0.1 10*3/uL (ref 0.0–0.5)
HGB: 14.3 g/dL (ref 13.0–17.1)
Immature Retic Fract: 2.5 % (ref 0.00–13.40)
MONO#: 0.5 10*3/uL (ref 0.1–0.9)
NEUT#: 4.6 10*3/uL (ref 1.5–6.5)
RBC: 4.47 10*6/uL (ref 4.20–5.82)
RDW: 12.9 % (ref 11.0–14.6)
Retic %: 1.01 % (ref 0.50–1.60)
Retic Ct Abs: 45.15 10*3/uL (ref 24.10–77.50)
WBC: 7.3 10*3/uL (ref 4.0–10.3)
lymph#: 2.1 10*3/uL (ref 0.9–3.3)

## 2009-05-20 LAB — IRON AND TIBC
Iron: 70 ug/dL (ref 42–165)
UIBC: 204 ug/dL

## 2009-05-26 ENCOUNTER — Ambulatory Visit: Payer: Self-pay | Admitting: Vascular Surgery

## 2009-06-02 ENCOUNTER — Ambulatory Visit: Payer: Self-pay | Admitting: Internal Medicine

## 2009-06-16 ENCOUNTER — Encounter: Payer: Self-pay | Admitting: Cardiology

## 2009-06-18 ENCOUNTER — Ambulatory Visit: Payer: Self-pay | Admitting: Internal Medicine

## 2009-06-18 LAB — CONVERTED CEMR LAB: POC INR: 3

## 2009-07-20 ENCOUNTER — Ambulatory Visit: Payer: Self-pay | Admitting: Cardiology

## 2009-07-20 ENCOUNTER — Ambulatory Visit: Payer: Self-pay | Admitting: Cardiovascular Disease

## 2009-08-07 ENCOUNTER — Ambulatory Visit: Payer: Self-pay | Admitting: Oncology

## 2009-08-11 ENCOUNTER — Ambulatory Visit: Payer: Self-pay | Admitting: Gastroenterology

## 2009-08-11 ENCOUNTER — Encounter: Payer: Self-pay | Admitting: Pulmonary Disease

## 2009-08-11 LAB — CBC WITH DIFFERENTIAL/PLATELET
Basophils Absolute: 0 10*3/uL (ref 0.0–0.1)
EOS%: 1.6 % (ref 0.0–7.0)
HCT: 42.2 % (ref 38.4–49.9)
HGB: 14.6 g/dL (ref 13.0–17.1)
MCH: 33 pg (ref 27.2–33.4)
MONO#: 0.5 10*3/uL (ref 0.1–0.9)
NEUT%: 62.8 % (ref 39.0–75.0)
lymph#: 1.8 10*3/uL (ref 0.9–3.3)

## 2009-08-11 LAB — VITAMIN B12: Vitamin B-12: 666 pg/mL (ref 211–911)

## 2009-08-11 LAB — IRON AND TIBC
%SAT: 39 % (ref 20–55)
TIBC: 277 ug/dL (ref 215–435)

## 2009-08-14 ENCOUNTER — Ambulatory Visit: Payer: Self-pay | Admitting: Pulmonary Disease

## 2009-08-14 DIAGNOSIS — R413 Other amnesia: Secondary | ICD-10-CM | POA: Insufficient documentation

## 2009-08-14 DIAGNOSIS — N401 Enlarged prostate with lower urinary tract symptoms: Secondary | ICD-10-CM | POA: Insufficient documentation

## 2009-08-17 ENCOUNTER — Ambulatory Visit: Payer: Self-pay | Admitting: Internal Medicine

## 2009-08-18 ENCOUNTER — Encounter: Payer: Self-pay | Admitting: Pulmonary Disease

## 2009-08-18 LAB — CONVERTED CEMR LAB
ALT: 22 units/L (ref 0–53)
BUN: 16 mg/dL (ref 6–23)
Bilirubin, Direct: 0.2 mg/dL (ref 0.0–0.3)
Calcium: 9.8 mg/dL (ref 8.4–10.5)
Chloride: 100 meq/L (ref 96–112)
Cholesterol: 183 mg/dL (ref 0–200)
Creatinine, Ser: 1 mg/dL (ref 0.4–1.5)
Eosinophils Absolute: 0.1 10*3/uL (ref 0.0–0.7)
Eosinophils Relative: 1.5 % (ref 0.0–5.0)
HDL: 44.7 mg/dL (ref >39.00)
LDL Cholesterol: 99 mg/dL (ref 0–99)
Lymphocytes Relative: 27 % (ref 12.0–46.0)
MCV: 97.3 fL (ref 78.0–100.0)
Monocytes Absolute: 0.6 10*3/uL (ref 0.1–1.0)
Neutrophils Relative %: 62.8 % (ref 43.0–77.0)
Platelets: 184 10*3/uL (ref 150.0–400.0)
Pro B Natriuretic peptide (BNP): 99 pg/mL (ref 0.0–100.0)
Total Bilirubin: 0.9 mg/dL (ref 0.3–1.2)
Triglycerides: 199 mg/dL — ABNORMAL HIGH (ref 0.0–149.0)
VLDL: 39.8 mg/dL (ref 0.0–40.0)
WBC: 7.7 10*3/uL (ref 4.5–10.5)

## 2009-09-14 ENCOUNTER — Ambulatory Visit: Payer: Self-pay | Admitting: Internal Medicine

## 2009-09-14 LAB — CONVERTED CEMR LAB: POC INR: 2.8

## 2009-10-12 ENCOUNTER — Encounter (INDEPENDENT_AMBULATORY_CARE_PROVIDER_SITE_OTHER): Payer: Self-pay | Admitting: Cardiology

## 2009-10-12 ENCOUNTER — Ambulatory Visit: Payer: Self-pay | Admitting: Cardiology

## 2009-11-04 ENCOUNTER — Ambulatory Visit: Payer: Self-pay | Admitting: Cardiology

## 2009-11-04 ENCOUNTER — Encounter: Payer: Self-pay | Admitting: Internal Medicine

## 2009-11-09 ENCOUNTER — Ambulatory Visit: Payer: Self-pay | Admitting: Cardiology

## 2009-11-09 LAB — CONVERTED CEMR LAB: POC INR: 1.4

## 2009-11-10 ENCOUNTER — Telehealth (INDEPENDENT_AMBULATORY_CARE_PROVIDER_SITE_OTHER): Payer: Self-pay | Admitting: *Deleted

## 2009-11-23 ENCOUNTER — Ambulatory Visit: Payer: Self-pay | Admitting: Cardiology

## 2009-11-23 LAB — CONVERTED CEMR LAB: POC INR: 2

## 2009-12-02 ENCOUNTER — Ambulatory Visit: Payer: Self-pay | Admitting: Vascular Surgery

## 2009-12-16 ENCOUNTER — Ambulatory Visit: Payer: Self-pay | Admitting: Pulmonary Disease

## 2009-12-21 ENCOUNTER — Ambulatory Visit: Payer: Self-pay | Admitting: Cardiology

## 2010-01-18 ENCOUNTER — Ambulatory Visit: Payer: Self-pay | Admitting: Internal Medicine

## 2010-02-02 ENCOUNTER — Ambulatory Visit: Payer: Self-pay | Admitting: Internal Medicine

## 2010-02-09 ENCOUNTER — Telehealth: Payer: Self-pay | Admitting: Pulmonary Disease

## 2010-02-11 ENCOUNTER — Ambulatory Visit: Payer: Self-pay | Admitting: Pulmonary Disease

## 2010-02-11 DIAGNOSIS — B029 Zoster without complications: Secondary | ICD-10-CM | POA: Insufficient documentation

## 2010-02-15 ENCOUNTER — Ambulatory Visit: Payer: Self-pay | Admitting: Cardiovascular Disease

## 2010-02-18 ENCOUNTER — Ambulatory Visit: Payer: Self-pay | Admitting: Oncology

## 2010-02-18 ENCOUNTER — Encounter: Payer: Self-pay | Admitting: Pulmonary Disease

## 2010-02-18 LAB — CBC & DIFF AND RETIC
Basophils Absolute: 0 10*3/uL (ref 0.0–0.1)
Eosinophils Absolute: 0.1 10*3/uL (ref 0.0–0.5)
HCT: 45.3 % (ref 38.4–49.9)
LYMPH%: 17.6 % (ref 14.0–49.0)
MONO#: 0.8 10*3/uL (ref 0.1–0.9)
NEUT#: 9 10*3/uL — ABNORMAL HIGH (ref 1.5–6.5)
NEUT%: 74.4 % (ref 39.0–75.0)
Platelets: 247 10*3/uL (ref 140–400)
WBC: 12.1 10*3/uL — ABNORMAL HIGH (ref 4.0–10.3)

## 2010-02-18 LAB — FERRITIN: Ferritin: 232 ng/mL (ref 22–322)

## 2010-02-18 LAB — IRON AND TIBC
%SAT: 32 % (ref 20–55)
Iron: 89 ug/dL (ref 42–165)
UIBC: 193 ug/dL

## 2010-02-19 ENCOUNTER — Telehealth: Payer: Self-pay | Admitting: Pulmonary Disease

## 2010-03-09 ENCOUNTER — Ambulatory Visit: Payer: Self-pay | Admitting: Cardiology

## 2010-04-06 ENCOUNTER — Ambulatory Visit: Payer: Self-pay | Admitting: Internal Medicine

## 2010-04-08 ENCOUNTER — Ambulatory Visit: Payer: Self-pay | Admitting: Pulmonary Disease

## 2010-04-09 DIAGNOSIS — H6120 Impacted cerumen, unspecified ear: Secondary | ICD-10-CM

## 2010-05-04 ENCOUNTER — Ambulatory Visit: Payer: Self-pay | Admitting: Internal Medicine

## 2010-05-05 ENCOUNTER — Ambulatory Visit: Payer: Self-pay | Admitting: Internal Medicine

## 2010-05-11 ENCOUNTER — Telehealth: Payer: Self-pay | Admitting: Pulmonary Disease

## 2010-05-12 ENCOUNTER — Ambulatory Visit: Payer: Self-pay | Admitting: Internal Medicine

## 2010-05-12 LAB — CONVERTED CEMR LAB: POC INR: 1.9

## 2010-05-31 ENCOUNTER — Ambulatory Visit: Payer: Self-pay | Admitting: Cardiology

## 2010-05-31 LAB — CONVERTED CEMR LAB: POC INR: 2.5

## 2010-06-29 ENCOUNTER — Ambulatory Visit: Payer: Self-pay | Admitting: Internal Medicine

## 2010-06-29 ENCOUNTER — Ambulatory Visit: Payer: Self-pay | Admitting: Cardiovascular Disease

## 2010-06-29 DIAGNOSIS — F341 Dysthymic disorder: Secondary | ICD-10-CM

## 2010-07-01 ENCOUNTER — Ambulatory Visit: Payer: Self-pay | Admitting: Cardiology

## 2010-07-13 ENCOUNTER — Telehealth: Payer: Self-pay | Admitting: Cardiology

## 2010-07-27 ENCOUNTER — Ambulatory Visit: Payer: Self-pay | Admitting: Cardiology

## 2010-07-27 LAB — CONVERTED CEMR LAB: POC INR: 2.7

## 2010-07-28 ENCOUNTER — Ambulatory Visit (HOSPITAL_COMMUNITY): Admission: RE | Admit: 2010-07-28 | Discharge: 2010-07-28 | Payer: Self-pay | Admitting: Cardiology

## 2010-07-28 ENCOUNTER — Encounter: Payer: Self-pay | Admitting: Cardiology

## 2010-07-28 ENCOUNTER — Ambulatory Visit: Payer: Self-pay

## 2010-07-28 ENCOUNTER — Ambulatory Visit: Payer: Self-pay | Admitting: Cardiology

## 2010-08-04 ENCOUNTER — Ambulatory Visit: Payer: Self-pay | Admitting: Internal Medicine

## 2010-08-06 ENCOUNTER — Ambulatory Visit: Payer: Self-pay | Admitting: Oncology

## 2010-08-10 ENCOUNTER — Encounter: Payer: Self-pay | Admitting: Pulmonary Disease

## 2010-08-10 LAB — COMPREHENSIVE METABOLIC PANEL
ALT: 13 U/L (ref 0–53)
Albumin: 4.4 g/dL (ref 3.5–5.2)
CO2: 26 mEq/L (ref 19–32)
Calcium: 9.6 mg/dL (ref 8.4–10.5)
Chloride: 99 mEq/L (ref 96–112)
Potassium: 4.1 mEq/L (ref 3.5–5.3)
Sodium: 137 mEq/L (ref 135–145)
Total Protein: 6.9 g/dL (ref 6.0–8.3)

## 2010-08-10 LAB — CBC & DIFF AND RETIC
Basophils Absolute: 0 10*3/uL (ref 0.0–0.1)
EOS%: 0.8 % (ref 0.0–7.0)
Eosinophils Absolute: 0.1 10*3/uL (ref 0.0–0.5)
HCT: 43.1 % (ref 38.4–49.9)
HGB: 14.5 g/dL (ref 13.0–17.1)
Immature Retic Fract: 4.4 % (ref 0.00–13.40)
MCH: 32.1 pg (ref 27.2–33.4)
NEUT%: 67.8 % (ref 39.0–75.0)
lymph#: 1.8 10*3/uL (ref 0.9–3.3)

## 2010-08-10 LAB — FERRITIN: Ferritin: 136 ng/mL (ref 22–322)

## 2010-08-13 ENCOUNTER — Ambulatory Visit: Payer: Self-pay | Admitting: Pulmonary Disease

## 2010-08-13 LAB — CONVERTED CEMR LAB
Alkaline Phosphatase: 82 units/L (ref 39–117)
Basophils Absolute: 0 10*3/uL (ref 0.0–0.1)
Bilirubin, Direct: 0.2 mg/dL (ref 0.0–0.3)
Calcium: 9.4 mg/dL (ref 8.4–10.5)
GFR calc non Af Amer: 79.81 mL/min (ref >60)
HCT: 44 % (ref 39.0–52.0)
Hemoglobin: 15.2 g/dL (ref 13.0–17.0)
Lymphs Abs: 2.3 10*3/uL (ref 0.7–4.0)
MCHC: 34.5 g/dL (ref 30.0–36.0)
Monocytes Relative: 7.5 % (ref 3.0–12.0)
Neutro Abs: 5.8 10*3/uL (ref 1.4–7.7)
PSA: 1 ng/mL (ref 0.10–4.00)
RDW: 13.2 % (ref 11.5–14.6)
Sodium: 141 meq/L (ref 135–145)
TSH: 2.25 microintl units/mL (ref 0.35–5.50)
Total CHOL/HDL Ratio: 3
VLDL: 39.2 mg/dL (ref 0.0–40.0)

## 2010-08-17 ENCOUNTER — Encounter: Payer: Self-pay | Admitting: Pulmonary Disease

## 2010-08-24 ENCOUNTER — Ambulatory Visit: Payer: Self-pay | Admitting: Cardiology

## 2010-09-07 ENCOUNTER — Telehealth: Payer: Self-pay | Admitting: Pulmonary Disease

## 2010-09-21 ENCOUNTER — Ambulatory Visit: Payer: Self-pay | Admitting: Cardiovascular Disease

## 2010-10-14 ENCOUNTER — Ambulatory Visit: Payer: Self-pay | Admitting: Pulmonary Disease

## 2010-10-19 ENCOUNTER — Ambulatory Visit: Payer: Self-pay | Admitting: Internal Medicine

## 2010-10-29 ENCOUNTER — Encounter: Payer: Self-pay | Admitting: Pulmonary Disease

## 2010-11-03 ENCOUNTER — Encounter: Payer: Self-pay | Admitting: Internal Medicine

## 2010-11-10 ENCOUNTER — Ambulatory Visit: Payer: Self-pay | Admitting: Internal Medicine

## 2010-11-16 ENCOUNTER — Ambulatory Visit: Admission: RE | Admit: 2010-11-16 | Discharge: 2010-11-16 | Payer: Self-pay | Source: Home / Self Care

## 2010-11-23 ENCOUNTER — Encounter (INDEPENDENT_AMBULATORY_CARE_PROVIDER_SITE_OTHER): Payer: Self-pay | Admitting: *Deleted

## 2010-11-27 ENCOUNTER — Encounter: Payer: Self-pay | Admitting: Gastroenterology

## 2010-12-06 ENCOUNTER — Ambulatory Visit
Admission: RE | Admit: 2010-12-06 | Discharge: 2010-12-06 | Payer: Self-pay | Source: Home / Self Care | Attending: Vascular Surgery | Admitting: Vascular Surgery

## 2010-12-06 ENCOUNTER — Ambulatory Visit: Admit: 2010-12-06 | Payer: Self-pay | Admitting: Vascular Surgery

## 2010-12-09 NOTE — Progress Notes (Signed)
Summary: rx  Phone Note Call from Patient Call back at Home Phone [phone]   Caller: Ricky [name] Call For: nadel Reason for Call: Talk to Nurse Summary of Call: Shingles on the right side top to bottom behind ear.  Head swollen there, swelling still there today and really sore.  Shingles drying up bu pt still feels really bad.   [facility] in [place] Initial call taken by: Eugene Gavia,  February 19, 2010 8:46 AM  Follow-up for Phone Call        The pt's wife says the shingles are drying up but she thinks there may be more swelling behind the right ear. The pt did say the pain medication is working for any pain he is having but the area is very sore to the touch. Please advise of any additional recs for this pt. Michel Bickers [facility]  February 19, 2010 9:11 AM  Additional Follow-up for Phone Call Additional follow up Details #1::        per SN---ok for pt to have neurontin 100mg 1 by mouth at bedtime x 10 days then increase to 2 tab at bedtime thereafter.  this has been sent to the pharmacy and pts wife is aware Ricky [name] CMA  February 19, 2010 5:24 PM     New/Updated Medications: NEURONTIN 100 MG CAPS (GABAPENTIN) take one tablet by mouth at bedtime x 10 days then take 2 tabs by mouth at bedtime thereafter Prescriptions: NEURONTIN 100 MG CAPS (GABAPENTIN) take one tablet by mouth at bedtime x 10 days then take 2 tabs by mouth at bedtime thereafter  #60 x 2   Entered by:   Ricky [name] CMA   Authorized by:   Michele Mcalpine MD   Signed by:   Ricky [name] CMA on 02/19/2010   Method used:   Electronically to        [facility] [phone]* (retail)       [address] [place], [place]  [address]       Ph: [phone] or [phone]       Fax: [phone]   RxID:   [id number]

## 2010-12-09 NOTE — Medication Information (Signed)
Summary: rov/tm  Anticoagulant Therapy  Managed by: Bethena Midget, RN, BSN Referring MD: Shawnie Pons MD PCP: [name], MD Supervising MD: Excell Seltzer MD, Casimiro Needle Indication 1: Atrial Fibrillation Indication 2: Gastrointestinal Hemorrhage (ICD-578) Lab Used: [facility] Site: [facility] INR POC 2.6 INR RANGE 2 - 3  Dietary changes: yes       Details: Eating less in general  Health status changes: yes       Details: Has had cold S&S , runny nose and coughing  Bleeding/hemorrhagic complications: yes       Details: Scant amt when he blows his nose   Recent/future hospitalizations: no    Any changes in medication regimen? yes       Details: taking Coricidin   Recent/future dental: no  Any missed doses?: no       Is patient compliant with meds? yes       Allergies: No Known Drug Allergies  Anticoagulation Management History:      The patient is taking warfarin and comes in today for a routine follow up visit.  Positive risk factors for bleeding include an age of 75 years or older.  The bleeding index is 'intermediate risk'.  Positive CHADS2 values include Age > 75 years old.  The start date was 08/08/1999.  His last INR was 8.0 RATIO.  Anticoagulation responsible provider: Excell Seltzer MD, Casimiro Needle.  INR POC: 2.6.  Cuvette Lot#: [id number].  Exp: 10/2011.    Anticoagulation Management Assessment/Plan:      The patient's current anticoagulation dose is Warfarin sodium 5 mg  tabs: as directed by the [facility]....  The target INR is 2.0-3.0.  The next INR is due 10/19/2010.  Anticoagulation instructions were given to patient.  Results were reviewed/authorized by Bethena Midget, RN, BSN.  He was notified by Bethena Midget, RN, BSN.         Prior Anticoagulation Instructions: INR 2.9 Continue 5mg s daily except 2.5mg s on Mondays and Fridays. Recheck in 4 weeks.   Current Anticoagulation Instructions: INR 2.6 Continue 5mg s everyday except 2.5mg s on  Mondays and Fridays. Recheck in 4  weeks.

## 2010-12-09 NOTE — Assessment & Plan Note (Signed)
Summary: Acute NP office visit - bronchitis   Primary Provider/Referring Provider:  [name], MD  CC:  prod cough with white/yellow mucus, wheezing, and DOE x5days - denies f/c/s.  History of Present Illness: 77 y/o WM   ~  [facility] 4/09 w/ [name] related to a partial gastric volvulus- he has a large HH w/ most of the stomach in his chest and [name] plans surgery> 8/09 had laparosopically reduced HH w/ repair of the hiatus & gastropexy to keep it reduced (nissen not done)... he has done well since then...   ~  [name]:  he has done well since his gastric surg... takes Prilosec daily... no new complaints or concerns... he had a follow up w/ [name] Oct09 and his iron malabsorption problem appears very stable- in fact labs from DrG (done Q71mo?) show Hg 14-15 and Fe levels 84-116 (stable)...   ~  Oct10:  wife is very concerned and tearful over memory loss, belligerent/ argumentative behavior, & mood swings... we did MMSE, ordered CT Brain, & will start Aricept... he has had several follow up visits: 1) [name] 7/10- AFib, s/p AV junct ablation for rate control, & pacer- doing satis.Marland KitchenMarland Kitchen 2) [name] 9/10 w/ ASHD, s/p CABG, Cardiomyopathy, & Chol- he decreased the Simvastatin to 40mg /d.Marland KitchenMarland Kitchen  3) [name] 10/10 w/ HH s/p repair, colon polyps, etc- doing well & f/u colon due 2013.   ~  Feb11:  he has mult minor somatic c/o- crick in neck, cramps in legs, etc... Aricept is helping some but wife still having a hard time coping... he had f/u pacer check w/ [name]- stable, and a f/u w/ [name] 12/10- stable, no changes made... he continues to f/u w/ [name]- seen 10/10 for Fe malabsorption- Hg= 14.6, Fe= 107, Ferritin= 120...   ~  February 11, 2010:  called w/ rash right post scalp & neck x several days- some itching, some pain... sounded like Shingles & we called in  ACYCLOVIR 800mg  Qid & Medrol dosepak... exam confirms C2 shingles on the right > given Depo80 & Hydrocodone for Prn use... otherw stable-  wife still very frustrated, he is difficult to manage, but he is asymptomatic- not c/o CP, palpit, dyspnea, etc...     04/08/10--Presents for follow up for shingles. Seen 2 months ago w/ C2 shingles outbreak , tx w/ Acyclovir and medrol dose pack. Rash has resolved. Him and his wife are concerned that the area where the rash was has some noticed puffiness . Has some residual tingling pain is mild and managemble. We talked about possible PHN and tx options however he does not feel this is bad enough for this.  Denies chest pain, , orthopnea, hemoptysis, fever, n/v/d, edema, headache. Ears are stopped up.   October 14, 2010 --Presents for an acute office visit. Complains of prod cough with white/yellow mucus, wheezing, DOE [id number]. OTC not helping. Denies chest pain,  orthopnea, hemoptysis, fever, n/v/d, edema, headache. Wife has same  symptoms. Cough is getting worse.    Medications Prior to Update: 1)  Warfarin Sodium 5 Mg  Tabs (Warfarin Sodium) .... As Directed By [facility].Marland KitchenMarland Kitchen 2)  Lisinopril 10 Mg  Tabs (Lisinopril) .... Take 1 Tab By Mouth Daily 3)  Simvastatin 40 Mg Tabs (Simvastatin) .... Take One Tablet By Mouth Daily At Bedtime 4)  Finasteride 5 Mg Tabs (Finasteride) .... Take 1 Tablet By Mouth Daily 5)  Aricept 10 Mg Tabs (Donepezil Hcl) .... Take 1 Tab By Mouth Once Daily.Marland KitchenMarland Kitchen 6)  Alprazolam 0.25 Mg  Tabs (Alprazolam) .... Take 1/2 To 1 Tab By Mouth Three Times A Day As Needed For Nerves.Marland KitchenMarland Kitchen 7)  Multivitamins   Tabs (Multiple Vitamin) .... Take 1 Tablet By Mouth Once A Day 8)  Mag-Ox 400 400 Mg Tabs (Magnesium Oxide) .Marland Kitchen.. 1 Two Times A Day 9)  Seroquel 25 Mg Tabs (Quetiapine Fumarate) .... Take 1-2 Tabs By Mouth At Bedtime As Directed... 10)  Mucinex Dm 30-600 Mg  Tb12 (Dextromethorphan-Guaifenesin) .... Take 1-2 Tabs By Mouth Two Times A Day As Needed For Congestion...  Allergies (verified): No Known Drug Allergies  Past History:  Past Medical History: Last updated:  08/13/2010 OBSTRUCTIVE SLEEP APNEA (ICD-327.23) ATHEROSCLEROTIC HEART DISEASE (ICD-414.00) CARDIOMYOPATHY, ISCHEMIC S/P CABG (ICD-414.8) ATRIAL FIBRILLATION (ICD-427.31) AV BLOCK, COMPLETE (ICD-426.0) PACEMAKER, PERMANENT DDD STJ (ICD-V45.01) CEREBROVASCULAR DISEASE (ICD-437.9) HYPERCHOLESTEROLEMIA (ICD-272.0) HIATAL HERNIA (ICD-553.3) GASTRITIS (ICD-535.50) DIVERTICULOSIS OF COLON (ICD-562.10) COLONIC POLYPS (ICD-211.3) BENIGN PROSTATIC HYPERTROPHY, WITH OBSTRUCTION (ICD-600.01) DEGENERATIVE JOINT DISEASE (ICD-715.90) MEMORY LOSS (ICD-780.93) ANXIETY (ICD-300.00) ANEMIA (ICD-285.9) SHINGLES (ICD-053.9)  Past Surgical History: Last updated: 08/13/2010 status post pacemaker-[employer]'s S/P CABG x 4 by DrGearhardt 9/00 S/P right carotid endarterectomy 10/04 by DrCDickson S/P Surg for Rectal Fissure Incarcerated stomach with takedown and repair of hiatus and gastropexy  Family History: Last updated: 12/12/2008 Family History of Coronary Artery Disease:   Social History: Last updated: 08/13/2010 Married, wife= [name], 46yrs... 1 Daughter from 1st marriage lives nearby... Tobacco Use - No.  Alcohol Use - no Occupation: Retired Son died 03/2010-accident on tractor  Risk Factors: Smoking Status: quit (08/13/2010)  Review of Systems      See HPI  Vital Signs:  Patient profile:   77 year old male Height:      69 inches Weight:      203.38 pounds BMI:     30.14 O2 Sat:      99 % on Room air Temp:     99.5 degrees F oral Pulse rate:   67 / minute BP sitting:   134 / 60  (left arm) Cuff size:   regular  Vitals Entered By: Boone Master CNA/MA (October 14, 2010 10:33 AM)  O2 Flow:  Room air CC: prod cough with white/yellow mucus, wheezing, DOE x5days - denies f/c/s Is Patient Diabetic? No Comments Medications reviewed with patient Daytime contact number verified with patient. Boone Master CNA/MA  October 14, 2010 10:33 AM    Physical Exam  Additional Exam:   WD, Overwt, 77 y/o WM in NAD... GENERAL:  Alert, pleasant, & cooperative... HEENT:  [place]/AT, EOM-wnl, PERRLA, EACs-clear,  NOSE-clear, THROAT-clear & wnl. NECK:  Supple w/ fairROM; no JVD; right carotid scar, norm impulses w/o bruits; no thyromegaly or nodules palpated; no lymphadenopathy. CHEST:  Coarse BS w/ few exp wheezing  HEART:  Median sternotomy scar, regular rhythm; without murmurs/ rubs/ or gallops heard... ABDOMEN:  Soft & nontender; normal bowel sounds; no organomegaly or masses detected. EXT:  mild arthritic changes; no varicose veins/ +venous insuffic/ tr edema. NEURO:  CN's intact;  no focal neuro deficits detected... DERM:  no rash present along scalp, mild hyperpigmentation, no significant edema noted.        Impression & Recommendations:  Problem # 1:  BRONCHITIS, ACUTE (ICD-466.0) Plan:  Augmentin 875mg  two times a day for 7 days Mucinex DM two times a day as needed cough/congestion Increase fluids and rest  Prednisone taper over next week.  Please contact office for sooner follow up if symptoms do not improve or worsen  follow up. Dr. Kriste Basque  as scheduled and as needed   His updated medication list for this problem includes:    Mucinex Dm 30-600 Mg Tb12 (Dextromethorphan-guaifenesin) .Marland Kitchen... Take 1-2 tabs by mouth two times a day as needed for congestion...    Augmentin 875-125 Mg Tabs (Amoxicillin-pot clavulanate) .Marland Kitchen... 1 by mouth two times a day  Medications Added to Medication List This Visit: 1)  Augmentin 875-125 Mg Tabs (Amoxicillin-pot clavulanate) .Marland Kitchen.. 1 by mouth two times a day 2)  Prednisone 10 Mg Tabs (Prednisone) .... 4 tabs for 2 days, then 3 tabs for 2 days, 2 tabs for 2 days, then 1 tab for 2 days, then stop  Complete Medication List: 1)  Warfarin Sodium 5 Mg Tabs (Warfarin sodium) .... As directed by the coumadin clinic.Marland KitchenMarland Kitchen 2)  Lisinopril 10 Mg Tabs (Lisinopril) .... Take 1 tab by mouth daily 3)  Simvastatin 40 Mg Tabs (Simvastatin) .... Take one  tablet by mouth daily at bedtime 4)  Finasteride 5 Mg Tabs (Finasteride) .... Take 1 tablet by mouth daily 5)  Aricept 10 Mg Tabs (Donepezil hcl) .... Take 1 tab by mouth once daily.Marland KitchenMarland Kitchen 6)  Alprazolam 0.25 Mg Tabs (Alprazolam) .... Take 1/2 to 1 tab by mouth three times a day as needed for nerves.Marland KitchenMarland Kitchen 7)  Multivitamins Tabs (Multiple vitamin) .... Take 1 tablet by mouth once a day 8)  Mag-ox 400 400 Mg Tabs (Magnesium oxide) .Marland Kitchen.. 1 two times a day 9)  Seroquel 25 Mg Tabs (Quetiapine fumarate) .... Take 1-2 tabs by mouth at bedtime as directed... 10)  Mucinex Dm 30-600 Mg Tb12 (Dextromethorphan-guaifenesin) .... Take 1-2 tabs by mouth two times a day as needed for congestion... 11)  Augmentin 875-125 Mg Tabs (Amoxicillin-pot clavulanate) .Marland Kitchen.. 1 by mouth two times a day 12)  Prednisone 10 Mg Tabs (Prednisone) .... 4 tabs for 2 days, then 3 tabs for 2 days, 2 tabs for 2 days, then 1 tab for 2 days, then stop  Patient Instructions: 1)  Augmentin 875mg  two times a day for 7 days 2)  Mucinex DM two times a day as needed cough/congestion 3)  Increase fluids and rest  4)  Prednisone taper over next week.  5)  Please contact office for sooner follow up if symptoms do not improve or worsen  6)  follow up. Dr. Kriste Basque as scheduled and as needed  Prescriptions: PREDNISONE 10 MG TABS (PREDNISONE) 4 tabs for 2 days, then 3 tabs for 2 days, 2 tabs for 2 days, then 1 tab for 2 days, then stop  #20 x 0   Entered and Authorized by:   Rubye Oaks NP   Signed by:   Rubye Oaks NP on 10/14/2010   Method used:   Electronically to        [facility] [phone]* (retail)       [address] [place], [place]  [address]       Ph: [phone] or [phone]       Fax: [phone]   RxID:   [id number] AUGMENTIN 875-125 MG TABS (AMOXICILLIN-POT CLAVULANATE) 1 by mouth two times a day  #14 x 0   Entered and Authorized by:   Rubye Oaks NP   Signed by:   Tammy Parrett NP on  10/14/2010   Method used:   Electronically to        [facility] [phone]* (retail)       [address] [place], [place]  [address]       Ph: [phone] or [phone]       Fax: [phone]   RxID:   [id number]     Appended Document: Orders Update-charge update     Clinical Lists Changes  Orders: Added new Service order of Est. Patient Level IV (57322) - Signed

## 2010-12-09 NOTE — Medication Information (Signed)
Summary: rov/eac  Anticoagulant Therapy  Managed by: Bethena Midget, RN, BSN Referring MD: Shawnie Pons MD PCP: [name], MD Supervising MD: Ladona Ridgel MD, Sharlot Gowda Indication 1: Atrial Fibrillation Indication 2: Gastrointestinal Hemorrhage (ICD-578) Lab Used: [facility]  Site: [facility] INR POC 2.4 INR RANGE 2 - 3  Dietary changes: no    Health status changes: yes       Details: Schillinges flare up  Bleeding/hemorrhagic complications: no    Recent/future hospitalizations: no    Any changes in medication regimen? no    Recent/future dental: no  Any missed doses?: no       Is patient compliant with meds? yes       Allergies: No Known Drug Allergies  Anticoagulation Management History:      The patient is taking warfarin and comes in today for a routine follow up visit.  Positive risk factors for bleeding include an age of 75 years or older.  The bleeding index is 'intermediate risk'.  Positive CHADS2 values include Age > 68 years old.  The start date was 08/08/1999.  His last INR was 8.0 RATIO.  Anticoagulation responsible provider: Ladona Ridgel MD, Sharlot Gowda.  INR POC: 2.4.  Cuvette Lot#: [id number].  Exp: 06/2011.    Anticoagulation Management Assessment/Plan:      The patient's current anticoagulation dose is Warfarin sodium 5 mg  tabs: as directed by the [facility]....  The target INR is 2.0-3.0.  The next INR is due 05/04/2010.  Anticoagulation instructions were given to patient.  Results were reviewed/authorized by Bethena Midget, RN, BSN.  He was notified by Bethena Midget, RN, BSN.         Prior Anticoagulation Instructions: INR 2.1  Continue taking 1/2 tablet on Monday and Friday and 1 tablet all other days.  Return to clinic in 4 weeks.   Current Anticoagulation Instructions: INR 2.4 Continue 5mg s everyday except 2.5mg s on Mondays and Fridays. Recheck in 4 weeks.

## 2010-12-09 NOTE — Progress Notes (Signed)
Summary: Schedule Echo  Phone Note Outgoing Call   Call placed by: Julieta Gutting, RN, BSN,  July 13, 2010 1:43 PM Call placed to: Patient Summary of Call: Lauren Can we schedule this gentleman for a 2D echo.  Tell him his last one was 2007, and I want to assess his valves and function after reviewing his chart.   Pt scheduled for ECHO on 07/28/10 at 1:00. Pt aware of test.  Initial call taken by: Julieta Gutting, RN, BSN,  July 13, 2010 1:49 PM

## 2010-12-09 NOTE — Medication Information (Signed)
Summary: rov/ewj  Anticoagulant Therapy  Managed by: Cloyde Reams, RN, BSN Referring MD: Shawnie Pons MD PCP: [name], MD Supervising MD: [name] MD, Maisie Fus Indication 1: Atrial Fibrillation Indication 2: Gastrointestinal Hemorrhage (ICD-578) Lab Used: [facility] Site: [facility] INR POC 2.5 INR RANGE 2 - 3  Dietary changes: no    Health status changes: no    Bleeding/hemorrhagic complications: no    Recent/future hospitalizations: no    Any changes in medication regimen? no    Recent/future dental: no  Any missed doses?: no       Is patient compliant with meds? yes       Allergies: No Known Drug Allergies  Anticoagulation Management History:      The patient is taking warfarin and comes in today for a routine follow up visit.  Positive risk factors for bleeding include an age of 75 years or older.  The bleeding index is 'intermediate risk'.  Positive CHADS2 values include Age > 51 years old.  The start date was 08/08/1999.  His last INR was 8.0 RATIO.  Anticoagulation responsible provider: [name] MD, Maisie Fus.  INR POC: 2.5.  Cuvette Lot#: [id number].  Exp: 08/2011.    Anticoagulation Management Assessment/Plan:      The patient's current anticoagulation dose is Warfarin sodium 5 mg  tabs: as directed by the [facility]....  The target INR is 2.0-3.0.  The next INR is due 06/29/2010.  Anticoagulation instructions were given to patient.  Results were reviewed/authorized by Cloyde Reams, RN, BSN.  He was notified by Cloyde Reams RN.         Prior Anticoagulation Instructions: INR 1.9  Take 1.5 tablets today, then resume same dosage 1 tablet daily except 1/2 tablet on Mondays and Fridays.  Recheck in 3 weeks.    Current Anticoagulation Instructions: INR 2.5  Continue on same dosage 1 tablet daily except 1/2 tablet on Mondays and Fridays.  Recheck in 4 weeks.

## 2010-12-09 NOTE — Progress Notes (Signed)
Summary: refill on aricept  Phone Note Call from Patient Call back at Home Phone [phone]   Caller: Patient Call For: Keyleigh Manninen Reason for Call: Refill Medication, Talk to Nurse Summary of Call: Patient calling to check status of donepezil refill.  Please call to inform patient. Initial call taken by: Lehman Prom,  September 07, 2010 2:54 PM  Follow-up for Phone Call        refill sent. pt aware.Carron Curie CMA  September 07, 2010 3:42 PM]    Prescriptions: ARICEPT 10 MG TABS (DONEPEZIL HCL) take 1 tab by mouth once daily...  #30 x 6   Entered by:   Carron Curie CMA   Authorized by:   Michele Mcalpine MD   Signed by:   Carron Curie CMA on 09/07/2010   Method used:   Electronically to        [facility] [phone]* (retail)       [address] [place], [place]  [address]       Ph: [phone] or [phone]       Fax: [phone]   RxID:   [id number]

## 2010-12-09 NOTE — Medication Information (Signed)
Summary: rov.cmp  Anticoagulant Therapy  Managed by: Cloyde Reams, RN, BSN Referring MD: Shawnie Pons MD PCP: [name], MD Supervising MD: [name] MD, Maisie Fus Indication 1: Atrial Fibrillation Indication 2: Gastrointestinal Hemorrhage (ICD-578) Lab Used: [facility] Site: [facility] INR POC 1.4 INR RANGE 2 - 3  Dietary changes: yes       Details: Collard greens for New Year.    Health status changes: no    Bleeding/hemorrhagic complications: no    Recent/future hospitalizations: no    Any changes in medication regimen? no    Recent/future dental: no  Any missed doses?: no       Is patient compliant with meds? yes       Current Medications (verified): 1)  Mucinex Dm 30-600 Mg  Tb12 (Dextromethorphan-Guaifenesin) .... Take 1-2 Tabs By Mouth Two Times A Day As Needed For Congestion.Marland KitchenMarland Kitchen 2)  Warfarin Sodium 5 Mg  Tabs (Warfarin Sodium) .... As Directed By [facility].Marland KitchenMarland Kitchen 3)  Lisinopril 10 Mg  Tabs (Lisinopril) .... Take 1 Tab By Mouth Daily 4)  Simvastatin 40 Mg Tabs (Simvastatin) .... Take One Tablet By Mouth Daily At Bedtime 5)  Finasteride 5 Mg Tabs (Finasteride) .... Take 1 Tablet By Mouth Daily 6)  Alprazolam 0.25 Mg  Tabs (Alprazolam) .... Take 1/2 To 1 Tab By Mouth Three Times A Day As Needed For Nerves.Marland KitchenMarland Kitchen 7)  Multivitamins   Tabs (Multiple Vitamin) .... Take 1 Tablet By Mouth Once A Day 8)  Aricept 10 Mg Tabs (Donepezil Hcl) .... Take 1/2 Tab By Mouth Once Daily For 1 Month, Then 1 Tab Daily Thereafter...  Allergies (verified): No Known Drug Allergies  Anticoagulation Management History:      The patient is taking warfarin and comes in today for a routine follow up visit.  Positive risk factors for bleeding include an age of 13 years or older.  The bleeding index is 'intermediate risk'.  Positive CHADS2 values include Age > 75 years old.  The start date was 08/08/1999.  His last INR was 8.0 RATIO.  Anticoagulation responsible provider: [name] MD, Maisie Fus.   INR POC: 1.4.  Cuvette Lot#: [id number].  Exp: 12/2010.    Anticoagulation Management Assessment/Plan:      The patient's current anticoagulation dose is Warfarin sodium 5 mg  tabs: as directed by the [facility]....  The target INR is 2.0-3.0.  The next INR is due 11/23/2009.  Anticoagulation instructions were given to patient.  Results were reviewed/authorized by Cloyde Reams, RN, BSN.  He was notified by Cloyde Reams RN.         Prior Anticoagulation Instructions: INR 2.4  Continue 0.5 tab on each Monday, Wednesday, Friday and 1 tab on all other days.  Recheck in 4 weeks.    Current Anticoagulation Instructions: INR 1.4  Take 1 tablet today and 1.5 tablets tomorrow, then resume same dosage 1 tablet daily except 1/2 tablet on Mondays, Wednesdays, and Fridays.  Recheck in 2 weeks.

## 2010-12-09 NOTE — Assessment & Plan Note (Signed)
Summary: Ricky [name]   Visit Type:  6 months follow up Primary Provider:  [name], MD  CC:  No cardiac complains.  History of Present Illness: He thinks he is doing quite well.  No chest pain.   No shortness of breath.  Shingles bother him.  They have some conflict related to her husbands dementia.  Son recently died in a four wheeler accident.    Current Medications (verified): 1)  Mucinex Dm 30-600 Mg  Tb12 (Dextromethorphan-Guaifenesin) .... Take 1-2 Tabs By Mouth Two Times A Day As Needed For Congestion.Marland KitchenMarland Kitchen 2)  Warfarin Sodium 5 Mg  Tabs (Warfarin Sodium) .... As Directed By [facility].Marland KitchenMarland Kitchen 3)  Lisinopril 10 Mg  Tabs (Lisinopril) .... Take 1 Tab By Mouth Daily 4)  Simvastatin 40 Mg Tabs (Simvastatin) .... Take One Tablet By Mouth Daily At Bedtime 5)  Finasteride 5 Mg Tabs (Finasteride) .... Take 1 Tablet By Mouth Daily 6)  Alprazolam 0.25 Mg  Tabs (Alprazolam) .... Take 1/2 To 1 Tab By Mouth Three Times A Day As Needed For Nerves.Marland KitchenMarland Kitchen 7)  Multivitamins   Tabs (Multiple Vitamin) .... Take 1 Tablet By Mouth Once A Day 8)  Aricept 10 Mg Tabs (Donepezil Hcl) .... Take 1 Tab By Mouth Once Daily.Marland KitchenMarland Kitchen 9)  Mag-Ox 400 400 Mg Tabs (Magnesium Oxide) .Marland Kitchen.. 1 Two Times A Day  Allergies (verified): No Known Drug Allergies  Vital Signs:  Patient profile:   77 year old male Height:      69 inches Weight:      201 pounds BMI:     29.79 Pulse rate:   70 / minute Pulse rhythm:   regular Resp:     18 per minute BP sitting:   112 / 64  (left arm) Cuff size:   large  Vitals Entered By: Vikki Ports (July 01, 2010 10:30 AM)  Physical Exam  General:  Well developed, well nourished, in no acute distress. Head:  normocephalic and atraumatic Eyes:  PERRLA/EOM intact; conjunctiva and lids normal. Lungs:  lungs clear Heart:  paradoxical splitting of S2.  SEM, minimal. Abdomen:  Bowel sounds positive; abdomen soft and non-tender without masses, organomegaly, or hernias noted. No  hepatosplenomegaly.   EKG  Procedure date:  07/01/2010  Findings:      v paced.  Permanent atrial fibrillation.  Echocardiogram  Procedure date:  06/15/2006  Findings:      SUMMARY   -  Overall left ventricular systolic function was mildly decreased.         Left ventricular wall thickness was at the upper limits of         normal. There was mild flattening of the interventricular         septum during systole and diastole. There was mild         dyssynergic motion of the interventricular septum, consistent         with a conduction abnormality or paced rhythm.   -  The aortic valve was mildly calcified. There was lower normal         aortic valve leaflet excursion.   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 There was mild to moderate mitral annular  calcification. There was lower normal mitral valve leaflet         excursion. Mitral regurgitation grade was 1+ on a scale of 0         to 4+.   -  Right ventricular size was at the upper limits of normal. The         estimated peak right ventricular systolic pressure was mild         to moderately increased. There was the appearance of a         catheter or pacing wire in the right ventricle.   -  Tricuspid regurgitation grade was 2+ on a scale of 0 to 4+.    ---------------------------------------------------------------   Prepared and Electronically Authenticated by   Dionicio Stall M.D.  [facility] Specifications Following MD:  Sherryl Manges, MD     PPM Vendor:  [facility]     PPM Model Number:  [phone]     PPM Serial Number:  [id number] PPM DOI:  10/19/2004     PPM Implanting MD:  Everardo Beals. Juanda Chance, MD  Lead 1    Location: RA     DOI: 02/20/1997     Model #: 1342T     Serial #: [id number]     Status: active Lead 2    Location: RV     DOI: 02/20/1997     Model  #: 1342T     Serial #: [id number]     Status: active  Magnet Response Rate:  BOL 98.6 ERI  86.3  Indications:  PAF; CHB AV node ablation   Explantation Comments:  TTM's with Mednet  PPM Follow Up Pacer Dependent:  Yes      Episodes Coumadin:  Yes  Parameters Mode:  DDIR     Lower Rate Limit:  70     Upper Rate Limit:  130 Paced AV Delay:  170     Sensed AV Delay:  150  Impression & Recommendations:  Problem # 1:  CARDIOMYOPATHY, ISCHEMIC S/P CABG (ICD-414.8) Stable at present.  Prior CABG.  See most recent echo study.  Stable at present.  On minimal meds. Will repeat echo if patient agreeable.  His updated medication list for this problem includes:    Warfarin Sodium 5 Mg Tabs (Warfarin sodium) .Marland Kitchen... As directed by the coumadin clinic...    Lisinopril 10 Mg Tabs (Lisinopril) .Marland Kitchen... Take 1 tab by mouth daily  Orders: EKG w/ Interpretation (93000)  His updated medication list for this problem includes:    Warfarin Sodium 5 Mg Tabs (Warfarin sodium) .Marland Kitchen... As directed by the coumadin clinic...    Lisinopril 10 Mg Tabs (Lisinopril) .Marland Kitchen... Take 1 tab by mouth daily  Problem # 2:  ATRIAL FIBRILLATION (ICD-427.31)  appropriate anticoagulation.  Permanent.  Pacer per SK.  His updated medication list for this problem includes:    Warfarin Sodium 5 Mg Tabs (Warfarin sodium) .Marland Kitchen... As directed by the coumadin clinic...  Orders: EKG w/ Interpretation (93000)  His updated medication list for this problem includes:    Warfarin Sodium 5 Mg Tabs (Warfarin sodium) .Marland Kitchen... As directed by the coumadin clinic...  Problem # 3:  AV BLOCK, COMPLETE (ICD-426.0)  status post PPM.  His updated medication list for this problem includes:    Warfarin Sodium 5 Mg Tabs (Warfarin sodium) .Marland Kitchen... As directed by the coumadin clinic...    Lisinopril 10 Mg Tabs (Lisinopril) .Marland Kitchen... Take 1 tab by mouth daily  His updated medication list for this problem includes:  Warfarin Sodium 5 Mg Tabs (Warfarin sodium)  .Marland Kitchen... As directed by the coumadin clinic...    Lisinopril 10 Mg Tabs (Lisinopril) .Marland Kitchen... Take 1 tab by mouth daily  Problem # 4:  HYPERCHOLESTEROLEMIA (ICD-272.0) Followed by Dr. Kriste Basque.  His updated medication list for this problem includes:    Simvastatin 40 Mg Tabs (Simvastatin) .Marland Kitchen... Take one tablet by mouth daily at bedtime  Patient Instructions: 1)  Your physician recommends that you continue on your current medications as directed. Please refer to the Current Medication list given to you today. 2)  Your physician wants you to follow-up in: 6 months  You will receive a reminder letter in the mail two months in advance. If you don't receive a letter, please call our office to schedule the follow-up appointment.

## 2010-12-09 NOTE — Letter (Signed)
Summary: Appointment - Reschedule  [facility]  [address] [place] [address]   Phone: [phone]  Fax: [phone]     November 23, 2010 MRN: [id number]   Ricky [name] [address] [place], [place]  [address]   Dear Ricky [name],   Due to a change in our office schedule, your appointment on                       at                has been changed to                                          .  It is very important that we reach you to reschedule this appointment. We look forward to participating in your health care needs. Please contact us at the number listed above at your earliest convenience to reschedule this appointment.     Sincerely,  Glass blower/designer

## 2010-12-09 NOTE — Cardiovascular Report (Signed)
Summary: TTM   TTM   Imported By: Roderic Ovens 06/01/2010 15:58:24  _____________________________________________________________________  External Attachment:    Type:   Image     Comment:   External Document
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
## 2010-12-09 NOTE — Progress Notes (Signed)
Summary: headache/ rash/ chills x 1 wk  Phone Note Call from Patient Call back at Home Phone [phone]   Caller: Spouse Call For: nadel Summary of Call: pt's spouse states that pt has a rash on back of his head- also has had a headache for "about a week" . has a fever (not known how high) and chills as well w/ runny nose. i asked if he had been outside in the weeds, eetc. spouse states that pt has been "out in the garden". spouse says that the rash is red/ bumpy (not wet or oozing". spouse has been out of town and is just now hearing about all of this from pt. pt requests to be seen by sn only. note: spouse elmon shader has an appt w/ sn this thursday am. Ilda Basset is [facility] in [place] Initial call taken by: Tivis Ringer, CNA,  February 09, 2010 2:28 PM  Follow-up for Phone Call        Marliss Czar, SN has an opening tomorrow at [facility] and 1030 on Thursday.  which date would you prefer? Boone Master CNA  February 09, 2010 3:14 PM    called and spoke with pts wife---she stated that she has been giving him tylenol and his temp was 100 right before she called.  i made pt appt for thursday at 10:30 but his wife would like to know what to do for him in the meantime.  please advisel  thanks Randell Loop CMA  February 09, 2010 3:49 PM   Additional Follow-up for Phone Call Additional follow up Details #1::        per SN---acyclovir 500mg   #28  1 by mouth four times daily---ok for medrol dosepak  #1  take as directed--clean with mild soapy water and use the tylenol advil as needed for pain.---keep appt for thursday Randell Loop CMA  February 09, 2010 4:28 PM   pt advised and rx sent. Carron Curie CMA  February 09, 2010 4:41 PM     New/Updated Medications: ACYCLOVIR 800 MG TABS (ACYCLOVIR) Take 1 tablet by mouth four times a day MEDROL (PAK) 4 MG TABS (METHYLPREDNISOLONE) as directed Prescriptions: MEDROL (PAK) 4 MG TABS (METHYLPREDNISOLONE) as directed  #1 pak x 0   Entered by:   Carron Curie CMA  Authorized by:   Michele Mcalpine MD   Signed by:   Carron Curie CMA on 02/09/2010   Method used:   Electronically to        [id number] (retail)       [address] [place], [place]  [address]       Ph: [phone] or [phone]       Fax: [phone]   RxID:   [id number] ACYCLOVIR 800 MG TABS (ACYCLOVIR) Take 1 tablet by mouth four times a day  #28 x 0   Entered by:   Carron Curie CMA   Authorized by:   Michele Mcalpine MD   Signed by:   Carron Curie CMA on 02/09/2010   Method used:   Electronically to        [phone]* (retail)       [address] [place] [address] [place], [place]  [address]       Ph: [phone] or [phone]  Fax: [phone]   RxID:   [id number]

## 2010-12-09 NOTE — Medication Information (Signed)
Summary: rov/ewj  Anticoagulant Therapy  Managed by: Cloyde Reams, RN, BSN Referring MD: Shawnie Pons MD PCP: [name], MD Supervising MD: Jens Som MD, Arlys Ramere Indication 1: Atrial Fibrillation Indication 2: Gastrointestinal Hemorrhage (ICD-578) Lab Used: [facility] Site: [facility] INR POC 2.0 INR RANGE 2 - 3  Dietary changes: no    Health status changes: no    Bleeding/hemorrhagic complications: no    Recent/future hospitalizations: no    Any changes in medication regimen? no    Recent/future dental: no  Any missed doses?: no       Is patient compliant with meds? yes       Allergies (verified): No Known Drug Allergies  Anticoagulation Management History:      The patient is taking warfarin and comes in today for a routine follow up visit.  Positive risk factors for bleeding include an age of 75 years or older.  The bleeding index is 'intermediate risk'.  Positive CHADS2 values include Age > 75 years old.  The start date was 08/08/1999.  His last INR was 8.0 RATIO.  Anticoagulation responsible provider: Jens Som MD, Arlys Rosaire.  INR POC: 2.0.  Cuvette Lot#: [id number].  Exp: 02/2011.    Anticoagulation Management Assessment/Plan:      The patient's current anticoagulation dose is Warfarin sodium 5 mg  tabs: as directed by the [facility]....  The target INR is 2.0-3.0.  The next INR is due 12/21/2009.  Anticoagulation instructions were given to patient.  Results were reviewed/authorized by Cloyde Reams, RN, BSN.  He was notified by Cloyde Reams RN.         Prior Anticoagulation Instructions: INR 1.4  Take 1 tablet today and 1.5 tablets tomorrow, then resume same dosage 1 tablet daily except 1/2 tablet on Mondays, Wednesdays, and Fridays.  Recheck in 2 weeks.      Current Anticoagulation Instructions: INR 2.0  Take 1 tablet tonight then resume same dosage 1 tablet daily except 1/2 tablet on Mondays, Wednesdays, and Fridays.  Recheck in 4 weeks.

## 2010-12-09 NOTE — Medication Information (Signed)
Summary: rov/tm  Anticoagulant Therapy  Managed by: Elaina Pattee, PharmD Referring MD: Shawnie Pons MD PCP: [name], MD Supervising MD: Gala Romney MD, Reuel Boom Indication 1: Atrial Fibrillation Indication 2: Gastrointestinal Hemorrhage (ICD-578) Lab Used: [facility] Site: [facility] INR POC 1.2 INR RANGE 2 - 3  Dietary changes: no    Health status changes: yes       Details: Still has the shingles.  Bleeding/hemorrhagic complications: no    Recent/future hospitalizations: no    Any changes in medication regimen? yes       Details: Has been taking an OTC for cramps. Will call back with the name today.  Recent/future dental: no  Any missed doses?: no       Is patient compliant with meds? yes       Allergies: No Known Drug Allergies  Anticoagulation Management History:      The patient is taking warfarin and comes in today for a routine follow up visit.  Positive risk factors for bleeding include an age of 33 years or older.  The bleeding index is 'intermediate risk'.  Positive CHADS2 values include Age > 22 years old.  The start date was 08/08/1999.  His last INR was 8.0 RATIO.  Anticoagulation responsible provider: Reuben Knoblock MD, Reuel Boom.  INR POC: 1.2.  Cuvette Lot#: [id number].  Exp: 06/2011.    Anticoagulation Management Assessment/Plan:      The patient's current anticoagulation dose is Warfarin sodium 5 mg  tabs: as directed by the [facility]....  The target INR is 2.0-3.0.  The next INR is due 05/12/2010.  Anticoagulation instructions were given to patient.  Results were reviewed/authorized by Elaina Pattee, PharmD.  He was notified by Elaina Pattee, PharmD.         Prior Anticoagulation Instructions: INR 2.4 Continue 5mg s everyday except 2.5mg s on Mondays and Fridays. Recheck in 4 weeks.   Current Anticoagulation Instructions: INR 1.2. Take 1.5 tablets today and tomorrow, then take 1 tablet daily except 0.5 tablet Mon and Fri. Recheck in 7-10 days.

## 2010-12-09 NOTE — Medication Information (Signed)
Summary: Ricky [name]  Anticoagulant Therapy  Managed by: Cloyde Reams, RN, BSN Referring MD: Shawnie Pons MD PCP: [name], MD Supervising MD: Clifton James MD, Cristal Deer Indication 1: Atrial Fibrillation Indication 2: Gastrointestinal Hemorrhage (ICD-578) Lab Used: [facility]  Site: [facility] INR POC 1.9 INR RANGE 2 - 3  Dietary changes: yes       Details: Eaten less salads.    Health status changes: yes       Details: Dx with shingles last week.    Bleeding/hemorrhagic complications: no    Recent/future hospitalizations: no    Any changes in medication regimen? yes       Details: Methylprednisolone dose pak completed today.Continue on Acyclovir 800mg  qid, tomorrow is last dose.  Vicodin 5/500 prn pain.   Recent/future dental: no  Any missed doses?: no       Is patient compliant with meds? yes       Allergies (verified): No Known Drug Allergies  Anticoagulation Management History:      The patient is taking warfarin and comes in today for a routine follow up visit.  Positive risk factors for bleeding include an age of 35 years or older.  The bleeding index is 'intermediate risk'.  Positive CHADS2 values include Age > 75 years old.  The start date was 08/08/1999.  His last INR was 8.0 RATIO.  Anticoagulation responsible provider: Clifton James MD, Cristal Deer.  INR POC: 1.9.  Cuvette Lot#: [id number].  Exp: 03/2011.    Anticoagulation Management Assessment/Plan:      The patient's current anticoagulation dose is Warfarin sodium 5 mg  tabs: as directed by the [facility]....  The target INR is 2.0-3.0.  The next INR is due 03/08/2010.  Anticoagulation instructions were given to patient.  Results were reviewed/authorized by Cloyde Reams, RN, BSN.  He was notified by Cloyde Reams RN.         Prior Anticoagulation Instructions: INR 1.8  Take 1 tablet today, then resume same dosage 1 tablet daily except 1/2 tablet on Mondays, Wednesdays, and Fridays.  Recheck in 3-4 weeks.     Current Anticoagulation Instructions: INR 1.9  Start taking 1 tablet daily except 1/2 tablet on Mondays and Fridays. Recheck in 3 weeks.

## 2010-12-09 NOTE — Letter (Signed)
Summary: [facility]  [facility]   Imported By: Sherian Rein 09/10/2010 10:08:32  _____________________________________________________________________  External Attachment:    Type:   Image     Comment:   External Document

## 2010-12-09 NOTE — Medication Information (Signed)
Summary: Ricky [name]  Anticoagulant Therapy  Managed by: Cloyde Reams, RN, BSN Referring MD: Shawnie Pons MD PCP: [name], MD Supervising MD: Eden Emms MD, Theron Arista Indication 1: Atrial Fibrillation Indication 2: Gastrointestinal Hemorrhage (ICD-578) Lab Used: [facility] Site: [facility] INR POC 2.3 INR RANGE 2 - 3  Dietary changes: no    Health status changes: no    Bleeding/hemorrhagic complications: no    Recent/future hospitalizations: no    Any changes in medication regimen? no    Recent/future dental: no  Any missed doses?: no       Is patient compliant with meds? yes       Allergies: No Known Drug Allergies  Anticoagulation Management History:      The patient is taking warfarin and comes in today for a routine follow up visit.  Positive risk factors for bleeding include an age of 75 years or older.  The bleeding index is 'intermediate risk'.  Positive CHADS2 values include Age > 75 years old.  The start date was 08/08/1999.  His last INR was 8.0 RATIO.  Anticoagulation responsible Dannika Hilgeman: Eden Emms MD, Theron Arista.  INR POC: 2.3.  Cuvette Lot#: [id number].  Exp: 08/2011.    Anticoagulation Management Assessment/Plan:      The patient's current anticoagulation dose is Warfarin sodium 5 mg  tabs: as directed by the Coumadin Clinic....  The target INR is 2.0-3.0.  The next INR is due 07/27/2010.  Anticoagulation instructions were given to patient.  Results were reviewed/authorized by Cloyde Reams, RN, BSN.  He was notified by Liana Gerold, PharmD Candidate.         Prior Anticoagulation Instructions: INR 2.5  Continue on same dosage 1 tablet daily except 1/2 tablet on Mondays and Fridays.  Recheck in 4 weeks.    Current Anticoagulation Instructions: INR 2.3  Continue 1 tablet daily except 1/2 tablet Mon and Fri.  Return to clinic in 4 weeks.

## 2010-12-09 NOTE — Assessment & Plan Note (Signed)
Summary: 4 months/apc   Primary Care Provider:  [name], MD  CC:  4 month ROV & review of mult medical problems....  History of Present Illness: 77 y/o WM here for a follow up visit... he has multiple medical problems as noted below...    ~  [facility] 02/08/08 w/ [name] related to a partial gastric volvulus- he has a large HH w/ most of the stomach in his chest and [name] plans surgery> 8/09 had laparosopically reduced HH w/ repair of the hiatus & gastropexy to keep it reduced (nissen not done)... he has done well since then...   ~  November 20, 2008:  he has done well since his gastric surg... takes Prilosec daily... no new complaints or concerns... he had a follow up w/ [name] Oct09 and his iron malabsorption problem appears very stable- in fact labs from DrG (done Q79mo?) show Hg 14-15 and Fe levels 84-116 (stable)...    ~  August 14, 2009:  wife is very concerned and tearful over memory loss, belligerent/ argumentative behavior, & mood swings... we did MMSE, ordered CT Brain, & will start Aricept... he has had several follow up visits: 1) [name] 7/10- AFib, s/p AV junct ablation for rate control, & pacer- doing satis.Marland KitchenMarland Kitchen 2) [name] 9/10 w/ ASHD, s/p CABG, Cardiomyopathy, & Chol- he decreased the Simvastatin to 40mg /d.Marland KitchenMarland Kitchen  3) [name] 10/10 w/ HH s/p repair, colon polyps, etc- doing well & f/u colon due 2013.   ~  December 17, 2009:  he has mult minor somatic c/o- crick in neck, cramps in legs, etc... Aricept is helping some but wife still having a hard time coping... he had f/u pacer check w/ [name]- stable, and a f/u w/ [name] 12/10- stable, no changes made... he continues to f/u w/ [name]- seen 10/10 for Fe malabsorption- Hg= 14.6, Fe= 107, Ferritin= 120...    Current Problem List:  OBSTRUCTIVE SLEEP APNEA (ICD-327.23) - sleep study 5/03 showed RDI=27 w/ desat to 86% & mod snoring... he refused CPAP Rx, and denies daytime hypersomnolence... apparently snoring doesn't  disturb wife & pt rests adeq but wakes daily at 4-5AM, "I sleep better in my recliner in front of the TV"... he does not want further intervention.  ATHEROSCLEROTIC HEART DISEASE (ICD-414.00) - on LISINOPRIL 10mg /d... followed by [name] Graciela Husbands for Cardiology... s/p CABG 9/00 and AFlutter ablation w/ pacer in 1998...  ~  12/10:  f/u by [name]- stable, no changes made...  ATRIAL FIBRILLATION, HX OF (ICD-V12.59) - on COUMADIN and followed in the Coumadin Clinic... he has a pacemaker for complete heart block & continues w/ regular pacer checks per Cards.  CEREBROVASCULAR DISEASE (ICD-437.9) - on ASA 81mg /d... he had a TIA in 2004 w/ carotid eval showing high grade right carotid obstruction... s/p right CAE by DrCDickson 10/04... CDopplers per [facility]...  ~  CDoppler 1/09 showed 60-79% left ICA stenosis, & normal right ICA- s/p surg.  ~  CDoppler 1/10 showed  patent right ICA with no evidence of restenosis, 60-79% stenosis of the left ICA.  ~  CT Brain 10/10 showed atrophy & sm vessel dis, NAD.  ~  CDoppler 1/11 showed patent right CAE site with no ICA stenosis, 60%-79% stenosis of the left ICA (no change from 7/10).  HYPERCHOLESTEROLEMIA (ICD-272.0) - on SIMVASTATIN 80mg /d + low chol diet...  ~  FLP 7/08 on Lip40 showed TChol 143, TG 173, HDL 40, LDL 69  ~  FLP 1/09 showed TChol 176, TG 155, HDL 42, LDL 103... Lipitor changed to  Simva80 for $$$.  ~  FLP 7/09 on Simva80 showed TChol 120, TG 150, HDL 42, LDL 48... rec- keep same.  ~  FLP 1/10 on Simva80 showed TChol 149, TG 143, HDL 44, LDL 77  ~  9/10: [name] decr his Simva80 to Simva40- wife states "the government cut it back"...  ~  FLP 10/10 on Simva40 showed TChol 183, TG 199, HDL 45, LDL 99  GASTRITIS (ICD-535.50) - on OMEPRAZOLE 20mg /d... prev treated for HPylori in 2007...he has a large HH and last EGD 6/08 by Dorris Singh revealed an esoph stricture- dilated... hosp 4/09 w/ part gastric volvulus and most of stomach in chest... EGD showed  massive HH, no lesions seen... Aug09 [name] did laparosopically reduced HH w/ repair of the hiatus & gastropexy to keep it reduced (Nissen not done).  DIVERTICULOSIS OF COLON (ICD-562.10) & COLONIC POLYPS (ICD-211.3) - 3mm adenomatous polyp removed from ascending colon in 2001... last colonoscopy by [facility] 4/04 w/ divertics + polyp... he's been eval for subseq rectal bleeding w/ fissure dx'd and Rx w/ botox 7/08 by [name]... he uses PROCTOCORT Cream Prn...  BENIGN PROSTATIC HYPERTROPHY, WITH OBSTRUCTION (ICD-600.01) - eval by DrPeterson 2010 for obstructive symptoms and hematuria (related to his coumadin)... neg cysto & started on AVODART 5mg /d...   DEGENERATIVE JOINT DISEASE (ICD-715.90)  MEMORY LOSS (ICD-780.93) - 10/10 OV wife voiced concern regarding memory loss & personality change- more argumentative, belligerent etc... MMSE showed changes and we decided to Rx w/ ARICEPT 10mg /d...  ANXIETY (ICD-300.00) - he's on ALPRAZOLAM 0.25mg  as needed.  Hx of ANEMIA (ICD-285.9) - he has been eval by [name] w/ an iron malabsorption anemia (bone marrow in 2001 w/ absent iron stores)... prev Rx w/ parenteral iron infusions... Blood counts, Fe, Ferritin levels checked frequently by [name] and all good.  ~  labs 10/10 by [name] showed Hg= 14.6, Fe= 107, Ferritin= 120...  Health Maintenance  ~  Immunizations:  had PNEUMOVAX 2007... received 2010 Flu shot 10/8...   Allergies (verified): No Known Drug Allergies  Comments:  Nurse/Medical Assistant: The patient's medications and allergies were reviewed with the patient and were updated in the Medication and Allergy Lists.  Past History:  Past Medical History:  OBSTRUCTIVE SLEEP APNEA (ICD-327.23) ATHEROSCLEROTIC HEART DISEASE (ICD-414.00) CARDIOMYOPATHY, ISCHEMIC S/P CABG (ICD-414.8) ATRIAL FIBRILLATION (ICD-427.31) Atrial flutter status post ablation AV BLOCK, COMPLETE (ICD-426.0) PACEMAKER, PERMANENT DDD STJ  (ICD-V45.01) CEREBROVASCULAR DISEASE (ICD-437.9) HYPERCHOLESTEROLEMIA (ICD-272.0) HIATAL HERNIA (ICD-553.3) GASTRITIS (ICD-535.50) DIVERTICULOSIS OF COLON (ICD-562.10) COLONIC POLYPS (ICD-211.3) BENIGN PROSTATIC HYPERTROPHY, WITH OBSTRUCTION (ICD-600.01) DEGENERATIVE JOINT DISEASE (ICD-715.90) MEMORY LOSS (ICD-780.93) ANXIETY (ICD-300.00) ANEMIA (ICD-285.9)  Past Surgical History: status post pacemaker-[employer] S/P CABG x 4 by DrGearhardt 9/00 S/P right carotid endarterectomy 10/04 by DrCDickson S/P Surg for Rectal Fissure Incarcerated stomach with takedown and repair of hiatus and gastropexy  Family History: Reviewed history from 12/12/2008 and no changes required. Family History of Coronary Artery Disease:   Social History: Reviewed history from 08/14/2009 and no changes required. Married, wife= [name], 24yrs... 1 Daughter from 1st marriage lives nearby... Tobacco Use - No.  Alcohol Use - no Occupation: Retired  Review of Systems      See HPI       The patient complains of dyspnea on exertion.  The patient denies anorexia, fever, weight loss, weight gain, vision loss, decreased hearing, hoarseness, chest pain, syncope, peripheral edema, prolonged cough, headaches, hemoptysis, abdominal pain, melena, hematochezia, severe indigestion/heartburn, hematuria, incontinence, muscle weakness, suspicious skin lesions, transient blindness, difficulty walking, depression, unusual weight change, abnormal  bleeding, enlarged lymph nodes, and angioedema.    Vital Signs:  Patient profile:   77 year old male Height:      69 inches Weight:      209.50 pounds O2 Sat:      97 % on Room air Temp:     98.2 degrees F oral Pulse rate:   82 / minute BP sitting:   140 / 80  (left arm) Cuff size:   regular  Vitals Entered By: Randell Loop CMA (December 16, 2009 10:37 AM)  O2 Sat at Rest %:  97 O2 Flow:  Room air CC: 4 month ROV & review of mult medical problems... Is Patient Diabetic?  No Pain Assessment Patient in pain? no      Comments no changes in meds today   Physical Exam  Additional Exam:  WD, Overwt, 77 y/o WM in NAD... GENERAL:  Alert, pleasant & cooperative... HEENT:  [place]/AT, EOM-wnl, PERRLA, EACs-clear, TMs-wnl, NOSE-clear, THROAT-clear & wnl. NECK:  Supple w/ fairROM; no JVD; right carotid scar, norm impulses w/o bruits; no thyromegaly or nodules palpated; no lymphadenopathy. CHEST:  Clear to P & A; without wheezes/ rales/ or rhonchi... HEART:  Median sternotomy scar, regular rhythm; without murmurs/ rubs/ or gallops heard... ABDOMEN:  Soft & nontender; normal bowel sounds; no organomegaly or masses detected. EXT:  mild arthritic changes; no varicose veins/ +venous insuffic/ tr edema. NEURO:  CN's intact;  no focal neuro deficits detected... DERM:  No lesions noted; no rash etc...  MMSE (08/14/09):  Date= Oct 8th, 2010; oriented x3;  Pres= Obama, VP= ?, prev Pres= ?;  3/3 objects immed & 3/3 after distraction;  serial 7's= 100 93 86 ?;  WORLD backwards= ?;  proverbs= concrete interpret.  MMSE (12/16/09):  Improved> VP= Biden, PrevPres= Bush, Serial 7's= 100, 93, 59no79, 71; WORLD backwards= DLROD     MISC. Report  Procedure date:  12/16/2009  Findings:      DATA REVIEWED:   ~  Notes from [name] 11/04/09 & previous...  ~  Note from [name] 08/18/09...  ~  Prev EMR notes and lab summary...    Impression & Recommendations:  Problem # 1:  OBSTRUCTIVE SLEEP APNEA (ICD-327.23) Denies problems...  Problem # 2:  ATHEROSCLEROTIC HEART DISEASE (ICD-414.00) Followed by [name]... continue his meds... His updated medication list for this problem includes:    Lisinopril 10 Mg Tabs (Lisinopril) .Marland Kitchen... Take 1 tab by mouth daily  Problem # 3:  CEREBROVASCULAR DISEASE (ICD-437.9) Followed by [facility] w/ CDopplers 1/11- no change... continue Coumadin, ASA 81mg ...  Problem # 4:  HYPERCHOLESTEROLEMIA (ICD-272.0) Stable on Simva40 per  [name]... His updated medication list for this problem includes:    Simvastatin 40 Mg Tabs (Simvastatin) .Marland Kitchen... Take one tablet by mouth daily at bedtime  Problem # 5:  GASTRITIS (ICD-535.50) GI is stable-  continue Rx.  Problem # 6:  BENIGN PROSTATIC HYPERTROPHY, WITH OBSTRUCTION (ICD-600.01) Followed by drPeterson-  stable... His updated medication list for this problem includes:    Finasteride 5 Mg Tabs (Finasteride) .Marland Kitchen... Take 1 tablet by mouth daily  Problem # 7:  MEMORY LOSS (ICD-780.93) Improved on the Aricept... contuinue Rx...  Problem # 8:  ANEMIA (ICD-285.9) Blood counts have been stable...  Complete Medication List: 1)  Mucinex Dm 30-600 Mg Tb12 (Dextromethorphan-guaifenesin) .... Take 1-2 tabs by mouth two times a day as needed for congestion.Marland KitchenMarland Kitchen 2)  Warfarin Sodium 5 Mg Tabs (Warfarin sodium) .... As directed by the coumadin clinic.Marland KitchenMarland Kitchen 3)  Lisinopril 10  Mg Tabs (Lisinopril) .... Take 1 tab by mouth daily 4)  Simvastatin 40 Mg Tabs (Simvastatin) .... Take one tablet by mouth daily at bedtime 5)  Finasteride 5 Mg Tabs (Finasteride) .... Take 1 tablet by mouth daily 6)  Alprazolam 0.25 Mg Tabs (Alprazolam) .... Take 1/2 to 1 tab by mouth three times a day as needed for nerves.Marland KitchenMarland Kitchen 7)  Multivitamins Tabs (Multiple vitamin) .... Take 1 tablet by mouth once a day 8)  Aricept 10 Mg Tabs (Donepezil hcl) .... Take 1/2 tab by mouth once daily for 1 month, then 1 tab daily thereafter...  Patient Instructions: 1)  Today we updated your med list- see below.... 2)  Continue your current meds the same for now... 3)  For your Cricks and Cramps- try the Bananas, Mustard, Tonic water as discussed... 4)  Call for any problems.Marland KitchenMarland Kitchen 5)  Please schedule a follow-up appointment in 6 months, & we will plan FASTINg blood work at that time.Marland KitchenMarland Kitchen

## 2010-12-09 NOTE — Medication Information (Signed)
Summary: rov/ewj  Anticoagulant Therapy  Managed by: Bethena Midget, RN, BSN Referring MD: Shawnie Pons MD PCP: [name], MD Supervising MD: Myrtis Ser MD, Tinnie Gens Indication 1: Atrial Fibrillation Indication 2: Gastrointestinal Hemorrhage (ICD-578) Lab Used: [facility] Site: [facility] INR POC 2.9 INR RANGE 2 - 3  Dietary changes: no    Health status changes: no    Bleeding/hemorrhagic complications: no    Recent/future hospitalizations: no    Any changes in medication regimen? yes       Details: new med Seroquel 25mg s 1-2 pills Qhs   Recent/future dental: no  Any missed doses?: no       Is patient compliant with meds? yes       Allergies: No Known Drug Allergies  Anticoagulation Management History:      The patient is taking warfarin and comes in today for a routine follow up visit.  Positive risk factors for bleeding include an age of 37 years or older.  The bleeding index is 'intermediate risk'.  Positive CHADS2 values include Age > 72 years old.  The start date was 08/08/1999.  His last INR was 8.0 RATIO.  Anticoagulation responsible provider: Myrtis Ser MD, Tinnie Gens.  INR POC: 2.9.  Cuvette Lot#: [id number].  Exp: 09/2011.    Anticoagulation Management Assessment/Plan:      The patient's current anticoagulation dose is Warfarin sodium 5 mg  tabs: as directed by the Coumadin Clinic....  The target INR is 2.0-3.0.  The next INR is due 09/21/2010.  Anticoagulation instructions were given to patient.  Results were reviewed/authorized by Bethena Midget, RN, BSN.  He was notified by Bethena Midget, RN, BSN.         Prior Anticoagulation Instructions: INR 2.7  Continue 1 tablet daily except 1/2 tablet on Mondays and Fridays.  Recheck in 4 weeks.    Current Anticoagulation Instructions: INR 2.9 Continue 5mg s daily except 2.5mg s on Mondays and Fridays. Recheck in 4 weeks.

## 2010-12-09 NOTE — Medication Information (Signed)
Summary: rov/sp  Anticoagulant Therapy  Managed by: Cloyde Reams, RN, BSN Referring MD: Shawnie Pons MD PCP: [name], MD Supervising MD: Daleen Squibb MD, Maisie Fus Indication 1: Atrial Fibrillation Indication 2: Gastrointestinal Hemorrhage (ICD-578) Lab Used: [facility] Site: [facility] INR POC 2.7 INR RANGE 2 - 3  Dietary changes: no    Health status changes: no    Bleeding/hemorrhagic complications: no    Recent/future hospitalizations: no    Any changes in medication regimen? yes       Details: OTC allergy med.   Recent/future dental: no  Any missed doses?: no       Is patient compliant with meds? yes       Allergies: No Known Drug Allergies  Anticoagulation Management History:      The patient is taking warfarin and comes in today for a routine follow up visit.  Positive risk factors for bleeding include an age of 75 years or older.  The bleeding index is 'intermediate risk'.  Positive CHADS2 values include Age > 68 years old.  The start date was 08/08/1999.  His last INR was 8.0 RATIO.  Anticoagulation responsible provider: Daleen Squibb MD, Maisie Fus.  INR POC: 2.7.  Cuvette Lot#: [id number].  Exp: 09/2011.    Anticoagulation Management Assessment/Plan:      The patient's current anticoagulation dose is Warfarin sodium 5 mg  tabs: as directed by the Coumadin Clinic....  The target INR is 2.0-3.0.  The next INR is due 08/24/2010.  Anticoagulation instructions were given to patient.  Results were reviewed/authorized by Cloyde Reams, RN, BSN.  He was notified by Cloyde Reams RN.         Prior Anticoagulation Instructions: INR 2.3  Continue 1 tablet daily except 1/2 tablet Mon and Fri.  Return to clinic in 4 weeks.  Current Anticoagulation Instructions: INR 2.7  Continue 1 tablet daily except 1/2 tablet on Mondays and Fridays.  Recheck in 4 weeks.

## 2010-12-09 NOTE — Cardiovascular Report (Signed)
Summary: TTM   TTM   Imported By: Roderic Ovens 02/19/2010 16:02:23  _____________________________________________________________________  External Attachment:    Type:   Image     Comment:   External Document
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
## 2010-12-09 NOTE — Cardiovascular Report (Signed)
Summary: TTM   TTM   Imported By: Roderic Ovens 12/01/2010 13:36:23  _____________________________________________________________________  External Attachment:    Type:   Image     Comment:   External Document

## 2010-12-09 NOTE — Progress Notes (Signed)
Summary: PACER CHECK QUESTION  Phone Note Call from Patient   Caller: Patient Reason for Call: Talk to Nurse Summary of Call: REQUEST TO SPEAK TO SOMEONE IN PACER CLINIC, GOT A CALL FROM [employer] STATING THEY WOULD CALL TOMORROW FOR PACER CHECK AND SHE STATES WE WERE TO CANCEL Initial call taken by: Migdalia Dk,  November 10, 2009 12:20 PM  Follow-up for Phone Call        [employer] notified to reschedule TTM for April, wife aware. Follow-up by: Altha Harm, LPN,  November 11, 2009 8:01 AM

## 2010-12-09 NOTE — Medication Information (Signed)
Summary: rov/mwb  Anticoagulant Therapy  Managed by: Cloyde Reams, RN, BSN Referring MD: Shawnie Pons MD PCP: [name], MD Supervising MD: Gala Romney MD, Reuel Boom Indication 1: Atrial Fibrillation Indication 2: Gastrointestinal Hemorrhage (ICD-578) Lab Used: [facility] Site: [facility] INR POC 3.0 INR RANGE 2 - 3  Dietary changes: no    Health status changes: no    Bleeding/hemorrhagic complications: no    Recent/future hospitalizations: no    Any changes in medication regimen? no    Recent/future dental: no  Any missed doses?: no       Is patient compliant with meds? yes       Allergies: No Known Drug Allergies  Anticoagulation Management History:      Positive risk factors for bleeding include an age of 75 years or older.  The bleeding index is 'intermediate risk'.  Positive CHADS2 values include Age > 7 years old.  The start date was 08/08/1999.  His last INR was 8.0 RATIO.  Anticoagulation responsible provider: Joelie Schou MD, Reuel Boom.  INR POC: 3.0.  Cuvette Lot#: [id number].  Exp: 12/2011.    Anticoagulation Management Assessment/Plan:      The patient's current anticoagulation dose is Warfarin sodium 5 mg  tabs: as directed by the Coumadin Clinic....  The target INR is 2.0-3.0.  The next INR is due 12/14/2010.  Anticoagulation instructions were given to patient.  Results were reviewed/authorized by Cloyde Reams, RN, BSN.  He was notified by Stephannie Peters, PharmD Candidate .         Prior Anticoagulation Instructions: INR 3.0 The patient is to continue with the same dose of coumadin.  This dosage includes:  Take 1/2 tablet (2.5mg ) on Mon and Fri Take 1 tabblet (5mg ) on the rest of the days Recheck INR in 4 weeks   Current Anticoagulation Instructions: INR 3.0  Coumadin 5 mg tablets - Continue 1 tablet every day except 1/2 tablet on Mondays and Fridays

## 2010-12-09 NOTE — Assessment & Plan Note (Signed)
Summary: rash/fever/headache/la   Primary Care Provider:  [name], MD  CC:  2 month ROV & add-on for rash....  History of Present Illness: 77 y/o WM here for a follow up visit... he has multiple medical problems as noted below...    ~  [facility] 4/09 w/ [name] related to a partial gastric volvulus- he has a large HH w/ most of the stomach in his chest and [name] plans surgery> 8/09 had laparosopically reduced HH w/ repair of the hiatus & gastropexy to keep it reduced (nissen not done)... he has done well since then...   ~  [name]:  he has done well since his gastric surg... takes Prilosec daily... no new complaints or concerns... he had a follow up w/ [name] Oct09 and his iron malabsorption problem appears very stable- in fact labs from DrG (done Q36mo?) show Hg 14-15 and Fe levels 84-116 (stable)...   ~  Oct10:  wife is very concerned and tearful over memory loss, belligerent/ argumentative behavior, & mood swings... we did MMSE, ordered CT Brain, & will start Aricept... he has had several follow up visits: 1) [name] 7/10- AFib, s/p AV junct ablation for rate control, & pacer- doing satis.Marland KitchenMarland Kitchen 2) [name] 9/10 w/ ASHD, s/p CABG, Cardiomyopathy, & Chol- he decreased the Simvastatin to 40mg /d.Marland KitchenMarland Kitchen  3) [name] 10/10 w/ HH s/p repair, colon polyps, etc- doing well & f/u colon due 2013.   ~  Feb11:  he has mult minor somatic c/o- crick in neck, cramps in legs, etc... Aricept is helping some but wife still having a hard time coping... he had f/u pacer check w/ [name]- stable, and a f/u w/ [name] 12/10- stable, no changes made... he continues to f/u w/ [name]- seen 10/10 for Fe malabsorption- Hg= 14.6, Fe= 107, Ferritin= 120...   ~  February 11, 2010:  called w/ rash right post scalp & neck x several days- some itching, some pain... sounded like Shingles & we called in  ACYCLOVIR 800mg  Qid & Medrol dosepak... exam confirms C2 shingles on the right > given Depo80 & Hydrocodone for Prn use...  otherw stable- wife still very frustrated, he is difficult to manage, but he is asymptomatic- not c/o CP, palpit, dyspnea, etc...   Current Problem List:  OBSTRUCTIVE SLEEP APNEA (ICD-327.23) - sleep study 5/03 showed RDI=27 w/ desat to 86% & mod snoring... he refused CPAP Rx, and denies daytime hypersomnolence... apparently snoring doesn't disturb wife & pt rests adeq but wakes daily at 4-5AM, "I sleep better in my recliner in front of the TV"... he does not want further intervention.  ATHEROSCLEROTIC HEART DISEASE (ICD-414.00) - on LISINOPRIL 10mg /d... followed by [name] Graciela Husbands for Cardiology... s/p CABG 9/00 and AFlutter ablation w/ pacer in 1998...  ~  12/10:  f/u by [name]- stable, no changes made...  ATRIAL FIBRILLATION, HX OF (ICD-V12.59) - on COUMADIN and followed in the [facility]madin [facility]... he has a pacemaker for complete heart block & continues w/ regular pacer checks per Cards.  CEREBROVASCULAR DISEASE (ICD-437.9) - on ASA 81mg /d... he had a TIA in 2004 w/ carotid eval showing high grade right carotid obstruction... s/p right CAE by DrCDickson 10/04... CDopplers per [facility]...  ~  CDoppler 1/09 showed 60-79% left ICA stenosis, & normal right ICA- s/p surg.  ~  CDoppler 1/10 showed  patent right ICA with no evidence of restenosis, 60-79% stenosis of the left ICA.  ~  CT Brain 10/10 showed atrophy & sm vessel dis, NAD.  ~  CDoppler 1/11 showed  patent right CAE site with no ICA stenosis, 60%-79% stenosis of the left ICA (no change from 7/10).  HYPERCHOLESTEROLEMIA (ICD-272.0) - on SIMVASTATIN 80mg /d + low chol diet...  ~  FLP 7/08 on Lip40 showed TChol 143, TG 173, HDL 40, LDL 69  ~  FLP 1/09 showed TChol 176, TG 155, HDL 42, LDL 103... Lipitor changed to Simva80 for $$$.  ~  FLP 7/09 on Simva80 showed TChol 120, TG 150, HDL 42, LDL 48... rec- keep same.  ~  FLP 1/10 on Simva80 showed TChol 149, TG 143, HDL 44, LDL 77  ~  9/10: [name] decr his Simva80 to Simva40- wife states "the  government cut it back"...  ~  FLP 10/10 on Simva40 showed TChol 183, TG 199, HDL 45, LDL 99  GASTRITIS (ICD-535.50) - on OMEPRAZOLE 20mg /d... prev treated for HPylori in 2007...he has a large HH and last EGD 6/08 by Dorris Singh revealed an esoph stricture- dilated... hosp 4/09 w/ part gastric volvulus and most of stomach in chest... EGD showed massive HH, no lesions seen... Aug09 [name] did laparosopically reduced HH w/ repair of the hiatus & gastropexy to keep it reduced (Nissen not done).  DIVERTICULOSIS OF COLON (ICD-562.10) & COLONIC POLYPS (ICD-211.3) - 3mm adenomatous polyp removed from ascending colon in 2001... last colonoscopy by [facility] 4/04 w/ divertics + polyp... he's been eval for subseq rectal bleeding w/ fissure dx'd and Rx w/ botox 7/08 by [name]... he uses PROCTOCORT Cream Prn...  BENIGN PROSTATIC HYPERTROPHY, WITH OBSTRUCTION (ICD-600.01) - eval by DrPeterson 2010 for obstructive symptoms and hematuria (related to his coumadin)... neg cysto & started on AVODART 5mg /d...   DEGENERATIVE JOINT DISEASE (ICD-715.90)  MEMORY LOSS (ICD-780.93) - 10/10 OV wife voiced concern regarding memory loss & personality change- more argumentative, belligerent etc... MMSE showed changes and we decided to Rx w/ ARICEPT 10mg /d...  ANXIETY (ICD-300.00) - he's on ALPRAZOLAM 0.25mg  as needed.  Hx of ANEMIA (ICD-285.9) - he has been eval by [name] w/ an iron malabsorption anemia (bone marrow in 2001 w/ absent iron stores)... prev Rx w/ parenteral iron infusions... Blood counts, Fe, Ferritin levels checked frequently by [name] and all good.  ~  labs 10/10 by [name] showed Hg= 14.6, Fe= 107, Ferritin= 120...  SHINGLES (ICD-053.9) - presented 4/11 w/ right C2 shingles> Rx w/ ACYCLOVIR, MEDROL, VICODIN... we discussed Shingles Vaccine as well & they will think about it...  Health Maintenance  ~  Immunizations:  had PNEUMOVAX 2007... received 2010 Flu shot 10/8...   Allergies  (verified): No Known Drug Allergies  Past History:  Past Medical History:  OBSTRUCTIVE SLEEP APNEA (ICD-327.23) ATHEROSCLEROTIC HEART DISEASE (ICD-414.00) CARDIOMYOPATHY, ISCHEMIC S/P CABG (ICD-414.8) ATRIAL FIBRILLATION (ICD-427.31) AV BLOCK, COMPLETE (ICD-426.0) PACEMAKER, PERMANENT DDD STJ (ICD-V45.01) CEREBROVASCULAR DISEASE (ICD-437.9) HYPERCHOLESTEROLEMIA (ICD-272.0) HIATAL HERNIA (ICD-553.3) GASTRITIS (ICD-535.50) DIVERTICULOSIS OF COLON (ICD-562.10) COLONIC POLYPS (ICD-211.3) BENIGN PROSTATIC HYPERTROPHY, WITH OBSTRUCTION (ICD-600.01) DEGENERATIVE JOINT DISEASE (ICD-715.90) MEMORY LOSS (ICD-780.93) ANXIETY (ICD-300.00) ANEMIA (ICD-285.9) SHINGLES (ICD-053.9)  Past Surgical History: status post pacemaker-[employer] S/P CABG x 4 by DrGearhardt 9/00 S/P right carotid endarterectomy 10/04 by DrCDickson S/P Surg for Rectal Fissure Incarcerated stomach with takedown and repair of hiatus and gastropexy  Family History: Reviewed history from 12/12/2008 and no changes required. Family History of Coronary Artery Disease:   Social History: Reviewed history from 08/14/2009 and no changes required. Married, wife= [name], 28yrs... 1 Daughter from 1st marriage lives nearby... Tobacco Use - No.  Alcohol Use - no Occupation: Retired  Review of Systems  See HPI       The patient complains of dyspnea on exertion.  The patient denies anorexia, fever, weight loss, weight gain, vision loss, decreased hearing, hoarseness, chest pain, syncope, peripheral edema, prolonged cough, headaches, hemoptysis, abdominal pain, melena, hematochezia, severe indigestion/heartburn, hematuria, incontinence, muscle weakness, suspicious skin lesions, transient blindness, difficulty walking, depression, unusual weight change, abnormal bleeding, enlarged lymph nodes, and angioedema.    Vital Signs:  Patient profile:   77 year old male Height:      69 inches Weight:      205.13 pounds BMI:      30.40 O2 Sat:      96 % on Room air Temp:     99.3 degrees F oral Pulse rate:   75 / minute BP sitting:   140 / 68  (left arm) Cuff size:   regular  Vitals Entered By: Randell Loop CMA (February 11, 2010 10:31 AM)  O2 Sat at Rest %:  96 O2 Flow:  Room air CC: 2 month ROV & add-on for rash... Is Patient Diabetic? No Pain Assessment Patient in pain? yes      Comments pt brought all meds today--no changes noted   Physical Exam  Additional Exam:  WD, Overwt, 77 y/o WM in NAD... GENERAL:  Alert, pleasant, & cooperative... HEENT:  [place]/AT, EOM-wnl, PERRLA, EACs-clear, TMs-wnl, NOSE-clear, THROAT-clear & wnl. NECK:  Supple w/ fairROM; no JVD; right carotid scar, norm impulses w/o bruits; no thyromegaly or nodules palpated; no lymphadenopathy. CHEST:  Clear to P & A; without wheezes/ rales/ or rhonchi... HEART:  Median sternotomy scar, regular rhythm; without murmurs/ rubs/ or gallops heard... ABDOMEN:  Soft & nontender; normal bowel sounds; no organomegaly or masses detected. EXT:  mild arthritic changes; no varicose veins/ +venous insuffic/ tr edema. NEURO:  CN's intact;  no focal neuro deficits detected... DERM:  excoriated blisters c/o shingles over the right post scapl & neck> C2 distrib...  MMSE (08/14/09):  Date= Oct 8th, 2010; oriented x3;  Pres= Obama, VP= ?, prev Pres= ?;  3/3 objects immed & 3/3 after distraction;  serial 7's= 100 93 86 ?;  WORLD backwards= ?;  proverbs= concrete interpret.  MMSE (12/16/09):  Improved> VP= Biden, PrevPres= Bush, Serial 7's= 100, 93, 59no79, 71; WORLD backwards= DLROD.    Impression & Recommendations:  Problem # 1:  SHINGLES (ICD-053.9) Right C2 shingles>> Rx w/ Acyclovir 800mg  Qid, Depo80, Medrol Dosepak, Vicodin 1/2 to 1 Q4-6H as needed for pain. Orders: Depo- Medrol 80mg  (J1040) Admin of Therapeutic Inj  intramuscular or subcutaneous ([id number])  Problem # 2:  CARDIOMYOPATHY, ISCHEMIC S/P CABG (ICD-414.8) Followed by [name]... no angina,  continue meds. His updated medication list for this problem includes:    Lisinopril 10 Mg Tabs (Lisinopril) .Marland Kitchen... Take 1 tab by mouth daily  Problem # 3:  AV BLOCK, COMPLETE (ICD-426.0) He has AFib on Coumadin, AVBlock w/ Pacer- followed by [name]...  Problem # 4:  CEREBROVASCULAR DISEASE (ICD-437.9) S/P right CAE w/ dopplers followed by [facility]- DrDickson...  Problem # 5:  HYPERCHOLESTEROLEMIA (ICD-272.0) Stable on the Simva40... His updated medication list for this problem includes:    Simvastatin 40 Mg Tabs (Simvastatin) .Marland Kitchen... Take one tablet by mouth daily at bedtime  Problem # 6:  HIATAL HERNIA (ICD-553.3) GI followed by Dorris Singh-  continue current meds.  Problem # 7:  OTHER MEDICAL PROBLEMS AS NOTED>>>  Complete Medication List: 1)  Mucinex Dm 30-600 Mg Tb12 (Dextromethorphan-guaifenesin) .... Take 1-2 tabs by mouth two times a day  as needed for congestion.Marland KitchenMarland Kitchen 2)  Warfarin Sodium 5 Mg Tabs (Warfarin sodium) .... As directed by the coumadin clinic.Marland KitchenMarland Kitchen 3)  Lisinopril 10 Mg Tabs (Lisinopril) .... Take 1 tab by mouth daily 4)  Simvastatin 40 Mg Tabs (Simvastatin) .... Take one tablet by mouth daily at bedtime 5)  Finasteride 5 Mg Tabs (Finasteride) .... Take 1 tablet by mouth daily 6)  Alprazolam 0.25 Mg Tabs (Alprazolam) .... Take 1/2 to 1 tab by mouth three times a day as needed for nerves.Marland KitchenMarland Kitchen 7)  Multivitamins Tabs (Multiple vitamin) .... Take 1 tablet by mouth once a day 8)  Aricept 10 Mg Tabs (Donepezil hcl) .... Take 1 tab by mouth once daily.Marland KitchenMarland Kitchen 9)  Acyclovir 800 Mg Tabs (Acyclovir) .... Take 1 tablet by mouth four times a day 10)  Medrol (pak) 4 Mg Tabs (Methylprednisolone) .... As directed 11)  Vicodin 5-500 Mg Tabs (Hydrocodone-acetaminophen) .... Take 1/2 to 1 tab by mouth every 4-6 h as needed for pain...  Other Orders: Prescription Created Electronically [phone])  Patient Instructions: 1)  Today we updated your med list- see below.... 2)  Continue your current meds the  same... 3)  Today we wrote a new perscription for generic Vicodin (Hydrocodone) to use for the post-shingles pain.Marland KitchenMarland Kitchen 4)  This should dry-up & resolve over the next several weeks.Marland KitchenMarland Kitchen 5)  Call for any problems.Marland KitchenMarland Kitchen 6)  Please schedule a follow-up appointment in 6 months, sooner as needed... Prescriptions: VICODIN 5-500 MG TABS (HYDROCODONE-ACETAMINOPHEN) take 1/2 to 1 tab by mouth every 4-6 H as needed for PAIN...  #50 x 2   Entered and Authorized by:   Michele Mcalpine MD   Signed by:   Michele Mcalpine MD on 02/11/2010   Method used:   Print then Give to Patient   RxID:   [id number]    Medication Administration  Injection # 1:    Medication: Depo- Medrol 80mg     Diagnosis: SHINGLES (ICD-053.9)    Route: IM    Site: RUOQ gluteus    Exp Date: 09/2012    Lot #: [id number]    Mfr: Pharmacia    Patient tolerated injection without complications    Given by: Randell Loop CMA (February 11, 2010 12:10 PM)  Orders Added: 1)  Prescription Created Electronically [G8553] 2)  Est. Patient Level IV [02725] 3)  Depo- Medrol 80mg  [J1040] 4)  Admin of Therapeutic Inj  intramuscular or subcutaneous [36644]

## 2010-12-09 NOTE — Medication Information (Signed)
Summary: rov/ewj  Anticoagulant Therapy  Managed by: Eda Keys, PharmD Referring MD: Shawnie Pons MD PCP: [name], MD Supervising MD: Shirlee Latch MD, Neiko Trivedi Indication 1: Atrial Fibrillation Indication 2: Gastrointestinal Hemorrhage (ICD-578) Lab Used: [facility] Site: [facility] INR POC 2.2 INR RANGE 2 - 3  Dietary changes: no    Health status changes: no    Bleeding/hemorrhagic complications: no    Recent/future hospitalizations: no    Any changes in medication regimen? no    Recent/future dental: no  Any missed doses?: no       Is patient compliant with meds? yes       Allergies: No Known Drug Allergies  Anticoagulation Management History:      The patient is taking warfarin and comes in today for a routine follow up visit.  Positive risk factors for bleeding include an age of 70 years or older.  The bleeding index is 'intermediate risk'.  Positive CHADS2 values include Age > 106 years old.  The start date was 08/08/1999.  His last INR was 8.0 RATIO.  Anticoagulation responsible provider: Shirlee Latch MD, Rylynne Schicker.  INR POC: 2.2.  Cuvette Lot#: [id number].  Exp: 02/2011.    Anticoagulation Management Assessment/Plan:      The patient's current anticoagulation dose is Warfarin sodium 5 mg  tabs: as directed by the Coumadin Clinic....  The target INR is 2.0-3.0.  The next INR is due 01/18/2010.  Anticoagulation instructions were given to patient.  Results were reviewed/authorized by Eda Keys, PharmD.  He was notified by Eda Keys.         Prior Anticoagulation Instructions: INR 2.0  Take 1 tablet tonight then resume same dosage 1 tablet daily except 1/2 tablet on Mondays, Wednesdays, and Fridays.  Recheck in 4 weeks.    Current Anticoagulation Instructions: INR 2.2  Continue current dosing schedule.  Take 1/2 tablet on Monday, Wednesday, and Friday, and take 1 tablet all other days. Return to clinic in 4 weeks.

## 2010-12-09 NOTE — Medication Information (Signed)
Summary: rov/tm  Anticoagulant Therapy  Managed by: Bethena Midget, RN, BSN Referring MD: Shawnie Pons MD PCP: [name], MD Supervising MD: Excell Seltzer MD, Casimiro Needle Indication 1: Atrial Fibrillation Indication 2: Gastrointestinal Hemorrhage (ICD-578) Lab Used: [facility]  Site: [facility] INR RANGE 2 - 3  Dietary changes: no    Health status changes: no    Bleeding/hemorrhagic complications: no    Recent/future hospitalizations: no    Any changes in medication regimen? yes       Details: Augmentin, prednison for 7 days  Recent/future dental: no  Any missed doses?: no       Is patient compliant with meds? yes       Allergies: No Known Drug Allergies  Anticoagulation Management History:      Positive risk factors for bleeding include an age of 75 years or older.  The bleeding index is 'intermediate risk'.  Positive CHADS2 values include Age > 88 years old.  The start date was 08/08/1999.  His last INR was 8.0 RATIO.  Anticoagulation responsible provider: Excell Seltzer MD, Casimiro Needle.  Cuvette Lot#: [id number].  Exp: 11/2011.    Anticoagulation Management Assessment/Plan:      The patient's current anticoagulation dose is Warfarin sodium 5 mg  tabs: as directed by the Coumadin Clinic....  The target INR is 2.0-3.0.  The next INR is due 11/16/2010.  Anticoagulation instructions were given to patient.  Results were reviewed/authorized by Bethena Midget, RN, BSN.         Prior Anticoagulation Instructions: INR 2.6 Continue 5mg s everyday except 2.5mg s on  Mondays and Fridays. Recheck in 4 weeks.   Current Anticoagulation Instructions: INR 3.0 The patient is to continue with the same dose of coumadin.  This dosage includes:  Take 1/2 tablet (2.5mg ) on Mon and Fri Take 1 tabblet (5mg ) on the rest of the days Recheck INR in 4 weeks

## 2010-12-09 NOTE — Consult Note (Signed)
Summary: [facility]  [facility]   Imported By: Sherian Rein 11/19/2010 13:46:52  _____________________________________________________________________  External Attachment:    Type:   Image     Comment:   External Document

## 2010-12-09 NOTE — Assessment & Plan Note (Signed)
Summary: 6 months/fasting/apc   Primary Care Provider:  [name], MD  CC:  6 month ROV & review of mult medical problems....  History of Present Illness: 77 y/o WM here for a follow up visit... he has multiple medical problems as noted below...    ~  [facility] 4/09 w/ [name] related to a partial gastric volvulus- he has a large HH w/ most of the stomach in his chest and [name] plans surgery> 8/09 had laparosopically reduced HH w/ repair of the hiatus & gastropexy to keep it reduced (nissen not done)...   ~  [name]:  he has done well since his gastric surg... takes Prilosec daily... no new complaints or concerns... he had a follow up w/ [name] Oct09 and his iron malabsorption problem appears very stable- in fact labs from DrG (done Q21mo?) show Hg 14-15 and Fe levels 84-116 (stable)...   ~  Oct10:  wife is very concerned and tearful over memory loss, belligerent/ argumentative behavior, & mood swings... we did MMSE, ordered CT Brain, & will start Aricept... he has had several follow up visits: 1) [name] 7/10- AFib, s/p AV junct ablation for rate control, & pacer- doing satis.Marland KitchenMarland Kitchen 2) [name] 9/10 w/ ASHD, s/p CABG, Cardiomyopathy, & Chol- he decreased the Simvastatin to 40mg /d.Marland KitchenMarland Kitchen  3) [name] 10/10 w/ HH s/p repair, colon polyps, etc- doing well & f/u colon due 2013.   ~  Feb11:  he has mult minor somatic c/o- crick in neck, cramps in legs, etc... Aricept is helping some but wife still having a hard time coping... he had f/u pacer check w/ [name]- stable, and a f/u w/ [name] 12/10- stable, no changes made... he continues to f/u w/ [name]- seen 10/10 for Fe malabsorption- Hg= 14.6, Fe= 107, Ferritin= 120...   ~  February 11, 2010:  called w/ rash right post scalp & neck x several days- some itching, some pain... sounded like Shingles & we called in  ACYCLOVIR 800mg  Qid & Medrol dosepak... exam confirms C2 shingles on the right > given Depo80 & Hydrocodone for Prn use... otherw stable- wife  still very frustrated, he is difficult to manage, but he is asymptomatic- not c/o CP, palpit, dyspnea, etc...   ~  August 13, 2010:  he had f/u appts w/ [name] & [name]- stable cardiac, 2DEcho w/ mild LVH (see report), [name] had lengthy discussion & rec antidepressant Rx but clearly he is demented & wife's afraid of his aggression & belligerence> rec- Neuro eval & trial Seroquel in the interim... f/u fasting labs (all essent WNL), & OK Flu shot...   Current Problem List:  OBSTRUCTIVE SLEEP APNEA (ICD-327.23) - sleep study 5/03 showed RDI=27 w/ desat to 86% & mod snoring... he refused CPAP Rx, and denies daytime hypersomnolence... apparently snoring doesn't disturb wife & pt rests adeq but wakes daily at 4-5AM, "I sleep better in my recliner in front of the TV"... he does not want further intervention.  ATHEROSCLEROTIC HEART DISEASE (ICD-414.00) & CARDIOMYOPATHY, ISCHEMIC S/P CABG (ICD-414.8) - on LISINOPRIL 10mg /d... followed by [name] Graciela Husbands for Cardiology... s/p CABG 9/00 and AFlutter ablation w/ pacer in 1998...  ~  8/11:  f/u by [name]- stable, no changes made...  ~  2DEcho 9/11 showed mild LVH, EF=55% w/ dyssynergy c/w RV pacing, biatrial enlargem, mild RV dil & pulm HTN.  ATRIAL FIBRILLATION, HX OF (ICD-V12.59), AV BLOCK, COMPLETE (ICD-426.0), & PACEMAKER, PERMANENT DDD STJ (ICD-V45.01) - on COUMADIN and followed in the Coumadin [facility]... he has a pacemaker  for complete heart block & continues w/ regular pacer checks per Cards.  CEREBROVASCULAR DISEASE (ICD-437.9) - on ASA 81mg /d... he had a TIA in 2004 w/ carotid eval showing high grade right carotid obstruction... s/p right CAE by DrCDickson 10/04... CDopplers per [facility]...  ~  CDoppler 1/09 showed 60-79% left ICA stenosis, & normal right ICA- s/p surg.  ~  CDoppler 1/10 showed  patent right ICA with no evidence of restenosis, 60-79% stenosis of the left ICA.  ~  CT Brain 10/10 showed atrophy & sm vessel dis, NAD.  ~  CDoppler  1/11 showed patent right CAE site with no ICA stenosis, 60%-79% stenosis of the left ICA (no change from 7/10).  HYPERCHOLESTEROLEMIA (ICD-272.0) - on SIMVASTATIN 80mg /d + low chol diet...  ~  FLP 7/08 on Lip40 showed TChol 143, TG 173, HDL 40, LDL 69  ~  FLP 1/09 showed TChol 176, TG 155, HDL 42, LDL 103... Lipitor changed to Simva80 for $$$.  ~  FLP 7/09 on Simva80 showed TChol 120, TG 150, HDL 42, LDL 48... rec- keep same.  ~  FLP 1/10 on Simva80 showed TChol 149, TG 143, HDL 44, LDL 77  ~  9/10: [name] decr his Simva80 to Simva40- wife states "the government cut it back"...  ~  FLP 10/10 on Simva40 showed TChol 183, TG 199, HDL 45, LDL 99  ~  FLP 10/11 on Simva40 showed TChol 160, TG 196, HDL 54, LDL 67  GASTRITIS (ICD-535.50) - on OMEPRAZOLE 20mg /d... prev treated for HPylori in 2007...he has a large HH and last EGD 6/08 by Dorris Singh revealed an esoph stricture- dilated... hosp 4/09 w/ part gastric volvulus and most of stomach in chest... EGD showed massive HH, no lesions seen... Aug09 [name] did laparosopically reduced HH w/ repair of the hiatus & gastropexy to keep it reduced (Nissen not done).  DIVERTICULOSIS OF COLON (ICD-562.10) & COLONIC POLYPS (ICD-211.3) - 3mm adenomatous polyp removed from ascending colon in 2001... last colonoscopy by [facility] 4/04 w/ divertics + polyp... he's been eval for subseq rectal bleeding w/ fissure dx'd and Rx w/ botox 7/08 by [name]... he uses PROCTOCORT Cream Prn...  BENIGN PROSTATIC HYPERTROPHY, WITH OBSTRUCTION (ICD-600.01) - eval by DrPeterson 2010 for obstructive symptoms and hematuria (related to his coumadin)... neg cysto & started on AVODART 5mg /d...  ~  labs 10/11 showed PSA= 1.00  DEGENERATIVE JOINT DISEASE (ICD-715.90)  MEMORY LOSS (ICD-780.93) - 10/10 OV wife voiced concern regarding memory loss & personality change- more argumentative, belligerent etc... MMSE showed changes and we decided to Rx w/ ARICEPT 10mg /d...  ANXIETY  (ICD-300.00) - he's on ALPRAZOLAM 0.25mg  as needed.  Hx of ANEMIA (ICD-285.9) - he has been eval by [name] w/ an iron malabsorption anemia (bone marrow in 2001 w/ absent iron stores)... prev Rx w/ parenteral iron infusions... Blood counts, Fe, Ferritin levels checked frequently by [name] and all good.  ~  labs 10/10 by [name] showed Hg= 14.6, Fe= 107, Ferritin= 120...  ~  labs here 10/11 showed Hg= 15.2, MCV= 97  SHINGLES (ICD-053.9) - presented 4/11 w/ right C2 shingles> Rx w/ ACYCLOVIR, MEDROL, VICODIN... we discussed Shingles Vaccine as well & they will think about it...  Health Maintenance  ~  Immunizations:  had PNEUMOVAX 2007... received 2010 Flu shot 10/8...   Preventive Screening-Counseling & Management  Alcohol-Tobacco     Smoking Status: quit     Year Quit: 02/1997  Comments: smoked 1-2 packs per day  Allergies (verified): No Known Drug Allergies  Comments:  Nurse/Medical Assistant: The patient's medications and allergies were reviewed with the patient and were updated in the Medication and Allergy Lists.  Past History:  Past Medical History: OBSTRUCTIVE SLEEP APNEA (ICD-327.23) ATHEROSCLEROTIC HEART DISEASE (ICD-414.00) CARDIOMYOPATHY, ISCHEMIC S/P CABG (ICD-414.8) ATRIAL FIBRILLATION (ICD-427.31) AV BLOCK, COMPLETE (ICD-426.0) PACEMAKER, PERMANENT DDD STJ (ICD-V45.01) CEREBROVASCULAR DISEASE (ICD-437.9) HYPERCHOLESTEROLEMIA (ICD-272.0) HIATAL HERNIA (ICD-553.3) GASTRITIS (ICD-535.50) DIVERTICULOSIS OF COLON (ICD-562.10) COLONIC POLYPS (ICD-211.3) BENIGN PROSTATIC HYPERTROPHY, WITH OBSTRUCTION (ICD-600.01) DEGENERATIVE JOINT DISEASE (ICD-715.90) MEMORY LOSS (ICD-780.93) ANXIETY (ICD-300.00) ANEMIA (ICD-285.9) SHINGLES (ICD-053.9)  Past Surgical History: status post pacemaker-[employer]'s S/P CABG x 4 by DrGearhardt 9/00 S/P right carotid endarterectomy 10/04 by DrCDickson S/P Surg for Rectal Fissure Incarcerated stomach with  takedown and repair of hiatus and gastropexy  Family History: Reviewed history from 12/12/2008 and no changes required. Family History of Coronary Artery Disease:   Social History: Reviewed history from 04/08/2010 and no changes required. Married, wife= [name], 51yrs... 1 Daughter from 1st marriage lives nearby... Tobacco Use - No.  Alcohol Use - no Occupation: Retired Son died 03/2010-accident on tractor Smoking Status:  quit  Review of Systems      See HPI       For his part, Weidinger is mostly asymptomatic; but as noted he has a mild dementia...  Vital Signs:  Patient profile:   77 year old male Height:      69 inches Weight:      203.25 pounds BMI:     30.12 O2 Sat:      97 % on Room air Temp:     98.3 degrees F oral Pulse rate:   74 / minute BP sitting:   150 / 82  (left arm) Cuff size:   regular  Vitals Entered By: Randell Loop CMA (August 13, 2010 9:16 AM)  O2 Sat at Rest %:  97 O2 Flow:  Room air CC: 6 month ROV & review of mult medical problems... Is Patient Diabetic? No Pain Assessment Patient in pain? no      Comments no changes in meds today   Physical Exam  Additional Exam:  WD, Overwt, 77 y/o WM in NAD... GENERAL:  Alert, pleasant, & cooperative... HEENT:  [place]/AT, EOM-wnl, PERRLA, EACs-clear, TMs-wnl, NOSE-clear, THROAT-clear & wnl. NECK:  Supple w/ fairROM; no JVD; right carotid scar, norm impulses w/o bruits; no thyromegaly or nodules palpated; no lymphadenopathy. CHEST:  Clear to P & A; without wheezes/ rales/ or rhonchi... HEART:  Median sternotomy scar, regular rhythm; without murmurs/ rubs/ or gallops heard... ABDOMEN:  Soft & nontender; normal bowel sounds; no organomegaly or masses detected. EXT:  mild arthritic changes; no varicose veins/ +venous insuffic/ tr edema. NEURO:  CN's intact;  no focal neuro deficits detected... DERM:  excoriated blisters c/o shingles over the right post scapl & neck> C2 distrib...  (MMSE (08/14/09):  Date= Oct  8th, 2010; oriented x3;  Pres= Obama, VP= ?, prev Pres= ?;  3/3 objects immed & 3/3 after distraction;  serial 7's= 100 93 86 ?;  WORLD backwards= ?;  proverbs= concrete interpret) (MMSE (12/16/09):  Improved> VP= Biden, PrevPres= Bush, Serial 7's= 100, 93, 59no79, 71; WORLD backwards= DLROD)    MISC. Report  Procedure date:  08/13/2010  Findings:      BMP (METABOL)   Sodium                    141 mEq/L  135-145   Potassium                 4.7 mEq/L                   3.5-5.1   Chloride                  102 mEq/L                   96-112   Carbon Dioxide            32 mEq/L                    19-32   Glucose              [H]  100 mg/dL                   14-78   BUN                       17 mg/dL                    2-95   Creatinine                1.0 mg/dL                   6.2-1.3   Calcium                   9.4 mg/dL                   0.8-65.7   GFR                       79.81 mL/min                >60  Hepatic/Liver Function Panel (HEPATIC)   Total Bilirubin           0.8 mg/dL                   8.4-6.9   Direct Bilirubin          0.2 mg/dL                   6.2-9.5   Alkaline Phosphatase      82 U/L                      39-117   AST                       25 U/L                      0-37   ALT                       18 U/L                      0-53   Total Protein             7.1 g/dL                    2.8-4.1   Albumin                   4.0 g/dL  3.5-5.2  CBC Platelet w/Diff (CBCD)   White Cell Count          8.8 K/uL                    4.5-10.5   Red Cell Count            4.56 Mil/uL                 4.22-5.81   Hemoglobin                15.2 g/dL                   04.5-40.9   Hematocrit                44.0 %                      39.0-52.0   MCV                       96.7 fl                     78.0-100.0   Platelet Count            185.0 K/uL                  150.0-400.0   Neutrophil %              65.2 %                      43.0-77.0    Lymphocyte %              25.5 %                      12.0-46.0   Monocyte %                7.5 %                       3.0-12.0   Eosinophils%              1.4 %                       0.0-5.0   Basophils %               0.4 %                       0.0-3.0  Comments:      Lipid Panel (LIPID)   Cholesterol               160 mg/dL                   8-119   Triglycerides        [H]  196.0 mg/dL                 1.4-782.9   HDL                       56.21 mg/dL                 >30.86   LDL Cholesterol           67 mg/dL  0-99   TSH (TSH)   FastTSH                   2.25 uIU/mL                 0.35-5.50  Prostate Specific Antigen (PSA)   PSA-Hyb                   1.00 ng/mL                  0.10-4.00   Impression & Recommendations:  Problem # 1:  ATHEROSCLEROTIC HEART DISEASE (ICD-414.00) Followed by [name] for Cards>  stable, same meds. His updated medication list for this problem includes:    Lisinopril 10 Mg Tabs (Lisinopril) .Marland Kitchen... Take 1 tab by mouth daily  Orders: TLB-BMP (Basic Metabolic Panel-BMET) (80048-METABOL) TLB-Hepatic/Liver Function Pnl (80076-HEPATIC) TLB-CBC Platelet - w/Differential (85025-CBCD) TLB-Lipid Panel (80061-LIPID) TLB-TSH (Thyroid Stimulating Hormone) (84443-TSH) TLB-PSA (Prostate Specific Antigen) (84153-PSA)  Problem # 2:  ATRIAL FIBRILLATION (ICD-427.31) Followed by [name] for EP>  stable, same meds. His updated medication list for this problem includes:    Warfarin Sodium 5 Mg Tabs (Warfarin sodium) .Marland Kitchen... As directed by the coumadin clinic...  Problem # 3:  CEREBROVASCULAR DISEASE (ICD-437.9) Followed by DrCDickson>  stable, same meds.  Problem # 4:  HYPERCHOLESTEROLEMIA (ICD-272.0) Stable on Simva80 & tol well... His updated medication list for this problem includes:    Simvastatin 40 Mg Tabs (Simvastatin) .Marland Kitchen... Take one tablet by mouth daily at bedtime  Problem # 5:  HIATAL HERNIA (ICD-553.3) GI is stable>  same  meds. His updated medication list for this problem includes:    Mag-ox 400 400 Mg Tabs (Magnesium oxide) .Marland Kitchen... 1 two times a day  Problem # 6:  MEMORY LOSS (ICD-780.93) He has a dementia & wife has prob coping w/ his behavioral changes> belligerant, argumentative, & angry- she is scared & no help from other family members... we discussed Rx w/ Seroquel 25mg  1-2 Qhs & appt w/ Neurology. Orders: Neurology Referral (Neuro)  Problem # 7:  OTHER MEDICAL ISSUES AS NOTED>>>  Complete Medication List: 1)  Mucinex Dm 30-600 Mg Tb12 (Dextromethorphan-guaifenesin) .... Take 1-2 tabs by mouth two times a day as needed for congestion.Marland KitchenMarland Kitchen 2)  Warfarin Sodium 5 Mg Tabs (Warfarin sodium) .... As directed by the coumadin clinic.Marland KitchenMarland Kitchen 3)  Lisinopril 10 Mg Tabs (Lisinopril) .... Take 1 tab by mouth daily 4)  Simvastatin 40 Mg Tabs (Simvastatin) .... Take one tablet by mouth daily at bedtime 5)  Finasteride 5 Mg Tabs (Finasteride) .... Take 1 tablet by mouth daily 6)  Aricept 10 Mg Tabs (Donepezil hcl) .... Take 1 tab by mouth once daily.Marland KitchenMarland Kitchen 7)  Alprazolam 0.25 Mg Tabs (Alprazolam) .... Take 1/2 to 1 tab by mouth three times a day as needed for nerves... 8)  Multivitamins Tabs (Multiple vitamin) .... Take 1 tablet by mouth once a day 9)  Mag-ox 400 400 Mg Tabs (Magnesium oxide) .Marland Kitchen.. 1 two times a day 10)  Seroquel 25 Mg Tabs (Quetiapine fumarate) .... Take 1-2 tabs by mouth at bedtime as directed...  Other Orders: Flu Vaccine 6yrs + MEDICARE PATIENTS (U9811) Administration Flu vaccine - MCR (B1478)  Patient Instructions: 1)  Today we updated your med list- see below.... 2)  We decided to add SEROQUEL 1-2 tabs at bedtime to help you rest better... 3)  We will arrange for a NEUROLOGY appt to check your memory.Marland KitchenMarland Kitchen 4)  Today we did your follow up  FASTING blood work... please call the "phone tree" in a few days for your lab results.Marland KitchenMarland Kitchen 5)  We also gave you the 2011 Flu shot ... 6)  Call for any questions.Marland KitchenMarland Kitchen 7)   Please schedule a follow-up appointment in 6 months. Prescriptions: SEROQUEL 25 MG TABS (QUETIAPINE FUMARATE) take 1-2 tabs by mouth at bedtime as directed...  #60 x 6   Entered and Authorized by:   Michele Mcalpine MD   Signed by:   Michele Mcalpine MD on 08/13/2010   Method used:   Print then Give to Patient   RxID:   [id number]    Flu Vaccine Consent Questions     Do you have a history of severe allergic reactions to this vaccine? no    Any prior history of allergic reactions to egg and/or gelatin? no    Do you have a sensitivity to the preservative Thimersol? no    Do you have a past history of Guillan-Barre Syndrome? no    Do you currently have an acute febrile illness? no    Have you ever had a severe reaction to latex? no    Vaccine information given and explained to patient? yes    Are you currently pregnant? no    Lot Number:[id number]   Exp Date:05/07/2011   Site Given  Left Deltoid IMflu   Randell Loop CMA  August 13, 2010 10:36 AM

## 2010-12-09 NOTE — Medication Information (Signed)
Summary: rov/ewj  Anticoagulant Therapy  Managed by: Eda Keys, PharmD Referring MD: Shawnie Pons MD PCP: [name], MD Supervising MD: Daleen Squibb MD, Maisie Fus Indication 1: Atrial Fibrillation Indication 2: Gastrointestinal Hemorrhage (ICD-578) Lab Used: [facility] Site: [facility] INR POC 2.1 INR RANGE 2 - 3  Dietary changes: no    Health status changes: no    Bleeding/hemorrhagic complications: no    Recent/future hospitalizations: no    Any changes in medication regimen? yes       Details: Pt has been on a course of meds for shingles meds  Recent/future dental: no  Any missed doses?: no       Is patient compliant with meds? yes       Current Medications (verified): 1)  Mucinex Dm 30-600 Mg  Tb12 (Dextromethorphan-Guaifenesin) .... Take 1-2 Tabs By Mouth Two Times A Day As Needed For Congestion.Marland KitchenMarland Kitchen 2)  Warfarin Sodium 5 Mg  Tabs (Warfarin Sodium) .... As Directed By The Coumadin Clinic.Marland KitchenMarland Kitchen 3)  Lisinopril 10 Mg  Tabs (Lisinopril) .... Take 1 Tab By Mouth Daily 4)  Simvastatin 40 Mg Tabs (Simvastatin) .... Take One Tablet By Mouth Daily At Bedtime 5)  Finasteride 5 Mg Tabs (Finasteride) .... Take 1 Tablet By Mouth Daily 6)  Alprazolam 0.25 Mg  Tabs (Alprazolam) .... Take 1/2 To 1 Tab By Mouth Three Times A Day As Needed For Nerves.Marland KitchenMarland Kitchen 7)  Multivitamins   Tabs (Multiple Vitamin) .... Take 1 Tablet By Mouth Once A Day 8)  Aricept 10 Mg Tabs (Donepezil Hcl) .... Take 1 Tab By Mouth Once Daily.Marland KitchenMarland Kitchen 9)  Acyclovir 800 Mg Tabs (Acyclovir) .... Take 1 Tablet By Mouth Four Times A Day 10)  Medrol (Pak) 4 Mg Tabs (Methylprednisolone) .... As Directed 11)  Vicodin 5-500 Mg Tabs (Hydrocodone-Acetaminophen) .... Take 1/2 To 1 Tab By Mouth Every 4-6 H As Needed For Pain... 12)  Neurontin 100 Mg Caps (Gabapentin) .... Take One Tablet By Mouth At Bedtime X 10 Days Then Take 2 Tabs By Mouth At Bedtime Thereafter  Allergies (verified): No Known Drug Allergies  Anticoagulation  Management History:      The patient is taking warfarin and comes in today for a routine follow up visit.  Positive risk factors for bleeding include an age of 75 years or older.  The bleeding index is 'intermediate risk'.  Positive CHADS2 values include Age > 75 years old.  The start date was 08/08/1999.  His last INR was 8.0 RATIO.  Anticoagulation responsible provider: Daleen Squibb MD, Maisie Fus.  INR POC: 2.1.  Cuvette Lot#: [id number].  Exp: 04/2011.    Anticoagulation Management Assessment/Plan:      The patient's current anticoagulation dose is Warfarin sodium 5 mg  tabs: as directed by the Coumadin Clinic....  The target INR is 2.0-3.0.  The next INR is due 04/06/2010.  Anticoagulation instructions were given to patient.  Results were reviewed/authorized by Eda Keys, PharmD.  He was notified by Eda Keys.         Prior Anticoagulation Instructions: INR 1.9  Start taking 1 tablet daily except 1/2 tablet on Mondays and Fridays. Recheck in 3 weeks.      Current Anticoagulation Instructions: INR 2.1  Continue taking 1/2 tablet on Monday and Friday and 1 tablet all other days.  Return to clinic in 4 weeks.

## 2010-12-09 NOTE — Medication Information (Signed)
Summary: rov/cb  Anticoagulant Therapy  Managed by: Cloyde Reams, RN, BSN Referring MD: Shawnie Pons MD PCP: [name], MD Supervising MD: Johney Frame MD, Fayrene Fearing Indication 1: Atrial Fibrillation Indication 2: Gastrointestinal Hemorrhage (ICD-578) Lab Used: [facility] Site: [facility] INR POC 1.9 INR RANGE 2 - 3  Dietary changes: no    Health status changes: no    Bleeding/hemorrhagic complications: no    Recent/future hospitalizations: no    Any changes in medication regimen? yes       Details: Magnesium oxide 400mg  bid.  Recent/future dental: no  Any missed doses?: no       Is patient compliant with meds? yes       Allergies: No Known Drug Allergies  Anticoagulation Management History:      The patient is taking warfarin and comes in today for a routine follow up visit.  Positive risk factors for bleeding include an age of 75 years or older.  The bleeding index is 'intermediate risk'.  Positive CHADS2 values include Age > 93 years old.  The start date was 08/08/1999.  His last INR was 8.0 RATIO.  Anticoagulation responsible provider: Shah Insley MD, Fayrene Fearing.  INR POC: 1.9.  Exp: 06/2011.    Anticoagulation Management Assessment/Plan:      The patient's current anticoagulation dose is Warfarin sodium 5 mg  tabs: as directed by the Coumadin Clinic....  The target INR is 2.0-3.0.  The next INR is due 05/31/2010.  Anticoagulation instructions were given to patient.  Results were reviewed/authorized by Cloyde Reams, RN, BSN.  He was notified by Cloyde Reams RN.         Prior Anticoagulation Instructions: INR 1.2. Take 1.5 tablets today and tomorrow, then take 1 tablet daily except 0.5 tablet Mon and Fri. Recheck in 7-10 days.  Current Anticoagulation Instructions: INR 1.9  Take 1.5 tablets today, then resume same dosage 1 tablet daily except 1/2 tablet on Mondays and Fridays.  Recheck in 3 weeks.

## 2010-12-09 NOTE — Assessment & Plan Note (Signed)
Summary: NP follow up - shingles   Primary Provider/Referring Provider:  [name], MD  CC:  f/u shingles. .  History of Present Illness: 77 y/o WM here for a follow up visit... he has multiple medical problems as noted below...    ~  [name] 4/09 w/ [name] related to a partial gastric volvulus- he has a large HH w/ most of the stomach in his chest and [name] plans surgery> 8/09 had laparosopically reduced HH w/ repair of the hiatus & gastropexy to keep it reduced (nissen not done)... he has done well since then...   ~  [name]:  he has done well since his gastric surg... takes Prilosec daily... no new complaints or concerns... he had a follow up w/ [name] Oct09 and his iron malabsorption problem appears very stable- in fact labs from DrG (done Q53mo?) show Hg 14-15 and Fe levels 84-116 (stable)...   ~  Oct10:  wife is very concerned and tearful over memory loss, belligerent/ argumentative behavior, & mood swings... we did MMSE, ordered CT Brain, & will start Aricept... he has had several follow up visits: 1) [name] 7/10- AFib, s/p AV junct ablation for rate control, & pacer- doing satis.Marland KitchenMarland Kitchen 2) [name] 9/10 w/ ASHD, s/p CABG, Cardiomyopathy, & Chol- he decreased the Simvastatin to 40mg /d..Marland Kitchen  3) [name] 10/10 w/ HH s/p repair, colon polyps, etc- doing well & f/u colon due 2013.   ~  Feb11:  he has mult minor somatic c/o- crick in neck, cramps in legs, etc... Aricept is helping some but wife still having a hard time coping... he had f/u pacer check w/ [name]- stable, and a f/u w/ [name] 12/10- stable, no changes made... he continues to f/u w/ [name]- seen 10/10 for Fe malabsorption- Hg= 14.6, Fe= 107, Ferritin= 120...   ~  February 11, 2010:  called w/ rash right post scalp & neck x several days- some itching, some pain... sounded like Shingles & we called in  ACYCLOVIR 800mg  Qid & Medrol dosepak... exam confirms C2 shingles on the right > given Depo80 & Hydrocodone for Prn use... otherw  stable- wife still very frustrated, he is difficult to manage, but he is asymptomatic- not c/o CP, palpit, dyspnea, etc...     04/08/10--Presents for follow up for shingles. Seen 2 months ago w/ C2 shingles outbreak , tx w/ Acyclovir and medrol dose pack. Rash has resolved. Him and his wife are concerned that the area where the rash was has some noticed puffiness . Has some residual tingling pain is mild and managemble. We talked about possible PHN and tx options however he does not feel this is bad enough for this.  Denies chest pain, , orthopnea, hemoptysis, fever, n/v/d, edema, headache. Ears are stopped up.   Preventive Screening-Counseling & Management  Alcohol-Tobacco     Smoking Status: never  Medications Prior to Update: 1)  Mucinex Dm 30-600 Mg  Tb12 (Dextromethorphan-Guaifenesin) .... Take 1-2 Tabs By Mouth Two Times A Day As Needed For Congestion.Marland KitchenMarland Kitchen 2)  Warfarin Sodium 5 Mg  Tabs (Warfarin Sodium) .... As Directed By [facility].Marland KitchenMarland Kitchen 3)  Lisinopril 10 Mg  Tabs (Lisinopril) .... Take 1 Tab By Mouth Daily 4)  Simvastatin 40 Mg Tabs (Simvastatin) .... Take One Tablet By Mouth Daily At Bedtime 5)  Finasteride 5 Mg Tabs (Finasteride) .... Take 1 Tablet By Mouth Daily 6)  Alprazolam 0.25 Mg  Tabs (Alprazolam) .... Take 1/2 To 1 Tab By Mouth Three Times A Day As Needed For Nerves.Marland KitchenMarland Kitchen  7)  Multivitamins   Tabs (Multiple Vitamin) .... Take 1 Tablet By Mouth Once A Day 8)  Aricept 10 Mg Tabs (Donepezil Hcl) .... Take 1 Tab By Mouth Once Daily.Marland KitchenMarland Kitchen 9)  Acyclovir 800 Mg Tabs (Acyclovir) .... Take 1 Tablet By Mouth Four Times A Day 10)  Medrol (Pak) 4 Mg Tabs (Methylprednisolone) .... As Directed 11)  Vicodin 5-500 Mg Tabs (Hydrocodone-Acetaminophen) .... Take 1/2 To 1 Tab By Mouth Every 4-6 H As Needed For Pain... 12)  Neurontin 100 Mg Caps (Gabapentin) .... Take One Tablet By Mouth At Bedtime X 10 Days Then Take 2 Tabs By Mouth At Bedtime Thereafter  Allergies (verified): No Known Drug  Allergies  Past History:  Past Medical History: Last updated: 02/11/2010  OBSTRUCTIVE SLEEP APNEA (ICD-327.23) ATHEROSCLEROTIC HEART DISEASE (ICD-414.00) CARDIOMYOPATHY, ISCHEMIC S/P CABG (ICD-414.8) ATRIAL FIBRILLATION (ICD-427.31) AV BLOCK, COMPLETE (ICD-426.0) PACEMAKER, PERMANENT DDD STJ (ICD-V45.01) CEREBROVASCULAR DISEASE (ICD-437.9) HYPERCHOLESTEROLEMIA (ICD-272.0) HIATAL HERNIA (ICD-553.3) GASTRITIS (ICD-535.50) DIVERTICULOSIS OF COLON (ICD-562.10) COLONIC POLYPS (ICD-211.3) BENIGN PROSTATIC HYPERTROPHY, WITH OBSTRUCTION (ICD-600.01) DEGENERATIVE JOINT DISEASE (ICD-715.90) MEMORY LOSS (ICD-780.93) ANXIETY (ICD-300.00) ANEMIA (ICD-285.9) SHINGLES (ICD-053.9)  Past Surgical History: Last updated: 02/11/2010 status post pacemaker-[employer]'s S/P CABG x 4 by DrGearhardt 9/00 S/P right carotid endarterectomy 10/04 by DrCDickson S/P Surg for Rectal Fissure Incarcerated stomach with takedown and repair of hiatus and gastropexy  Family History: Last updated: 12/12/2008 Family History of Coronary Artery Disease:   Social History: Last updated: 04/08/2010 Married, wife= [name], 19yrs... 1 Daughter from 1st marriage lives nearby... Tobacco Use - No.  Alcohol Use - no Occupation: Retired Son died 03/2010-accident on tractor.   Risk Factors: Smoking Status: never (04/08/2010)  Social History: Married, wife= [name], 29yrs... 1 Daughter from 1st marriage lives nearby... Tobacco Use - No.  Alcohol Use - no Occupation: Retired Son died 03/2010-accident on tractor.   Review of Systems      See HPI  Vital Signs:  Patient profile:   77 year old male Height:      69 inches Weight:      197 pounds BMI:     29.20 O2 Sat:      98 % on Room air Temp:     99.9 degrees F oral Pulse rate:   73 / minute BP sitting:   108 / 64  (left arm) Cuff size:   regular  Vitals Entered By: Boone Master CNA/MA (April 08, 2010 12:10 PM)  O2 Flow:  Room air CC: f/u shingles.  Is  Patient Diabetic? No Comments Medications reviewed with patient Daytime contact number verified with patient. Boone Master CNA/MA  April 08, 2010 12:10 PM    Physical Exam  Additional Exam:  WD, Overwt, 77 y/o WM in NAD... GENERAL:  Alert, pleasant, & cooperative... HEENT:  [place]/AT, EOM-wnl, PERRLA, EACs-clear, TMs-mild bilateral cerumen impactions, NOSE-clear, THROAT-clear & wnl. NECK:  Supple w/ fairROM; no JVD; right carotid scar, norm impulses w/o bruits; no thyromegaly or nodules palpated; no lymphadenopathy. CHEST:  Clear to P & A; without wheezes/ rales/ or rhonchi... HEART:  Median sternotomy scar, regular rhythm; without murmurs/ rubs/ or gallops heard... ABDOMEN:  Soft & nontender; normal bowel sounds; no organomegaly or masses detected. EXT:  mild arthritic changes; no varicose veins/ +venous insuffic/ tr edema. NEURO:  CN's intact;  no focal neuro deficits detected... DERM:  no rash present along scalp, mild hyperpigmentation, no significant edema noted.        Impression & Recommendations:  Problem # 1:  SHINGLES (ICD-053.9)  Resolving shingles w/ no significant pain.  REC:  tylenol as needed  no further tx needed.  if pain increases consider lyrica  Orders: Est. Patient Level III (50539)  Problem # 2:  CERUMEN IMPACTION, BILATERAL (ICD-380.4)  bilateral ear irrigation w/ cerumen extraction tolerated well.   Orders: Est. Patient Level III (76734)  Complete Medication List: 1)  Mucinex Dm 30-600 Mg Tb12 (Dextromethorphan-guaifenesin) .... Take 1-2 tabs by mouth two times a day as needed for congestion.Marland KitchenMarland Kitchen 2)  Warfarin Sodium 5 Mg Tabs (Warfarin sodium) .... As directed by the coumadin clinic.Marland KitchenMarland Kitchen 3)  Lisinopril 10 Mg Tabs (Lisinopril) .... Take 1 tab by mouth daily 4)  Simvastatin 40 Mg Tabs (Simvastatin) .... Take one tablet by mouth daily at bedtime 5)  Finasteride 5 Mg Tabs (Finasteride) .... Take 1 tablet by mouth daily 6)  Alprazolam 0.25 Mg Tabs  (Alprazolam) .... Take 1/2 to 1 tab by mouth three times a day as needed for nerves.Marland KitchenMarland Kitchen 7)  Multivitamins Tabs (Multiple vitamin) .... Take 1 tablet by mouth once a day 8)  Aricept 10 Mg Tabs (Donepezil hcl) .... Take 1 tab by mouth once daily.Marland KitchenMarland Kitchen 9)  Acyclovir 800 Mg Tabs (Acyclovir) .... Take 1 tablet by mouth four times a day 10)  Vicodin 5-500 Mg Tabs (Hydrocodone-acetaminophen) .... Take 1/2 to 1 tab by mouth every 4-6 h as needed for pain... 11)  Neurontin 100 Mg Caps (Gabapentin) .... Take one tablet by mouth at bedtime x 10 days then take 2 tabs by mouth at bedtime thereafter  Patient Instructions: 1)  Continue on current regimen.  2)  Debrox as needed for earwax.  3)   Please contact office for sooner follow up if symptoms do not improve or worsen

## 2010-12-09 NOTE — Medication Information (Signed)
Summary: rov/eac  Anticoagulant Therapy  Managed by: Cloyde Reams, RN, BSN Referring MD: Shawnie Pons MD PCP: [name], MD Supervising MD: Tenny Craw MD, Gunnar Fusi Indication 1: Atrial Fibrillation Indication 2: Gastrointestinal Hemorrhage (ICD-578) Lab Used: [facility] Site: [facility] INR POC 1.8 INR RANGE 2 - 3  Dietary changes: yes       Details: Incr intake of salads.  Health status changes: no    Bleeding/hemorrhagic complications: no    Recent/future hospitalizations: no    Any changes in medication regimen? no    Recent/future dental: no  Any missed doses?: no       Is patient compliant with meds? yes       Allergies: No Known Drug Allergies  Anticoagulation Management History:      The patient is taking warfarin and comes in today for a routine follow up visit.  Positive risk factors for bleeding include an age of 75 years or older.  The bleeding index is 'intermediate risk'.  Positive CHADS2 values include Age > 45 years old.  The start date was 08/08/1999.  His last INR was 8.0 RATIO.  Anticoagulation responsible provider: Tenny Craw MD, Gunnar Fusi.  INR POC: 1.8.  Cuvette Lot#: [id number].  Exp: 03/2011.    Anticoagulation Management Assessment/Plan:      The patient's current anticoagulation dose is Warfarin sodium 5 mg  tabs: as directed by the [facility]....  The target INR is 2.0-3.0.  The next INR is due 02/15/2010.  Anticoagulation instructions were given to patient.  Results were reviewed/authorized by Cloyde Reams, RN, BSN.  He was notified by Cloyde Reams RN.         Prior Anticoagulation Instructions: INR 2.2  Continue current dosing schedule.  Take 1/2 tablet on Monday, Wednesday, and Friday, and take 1 tablet all other days. Return to clinic in 4 weeks.  Current Anticoagulation Instructions: INR 1.8  Take 1 tablet today, then resume same dosage 1 tablet daily except 1/2 tablet on Mondays, Wednesdays, and Fridays.  Recheck in 3-4 weeks.

## 2010-12-09 NOTE — Cardiovascular Report (Signed)
Summary: Office Visit   Office Visit   Imported By: Roderic Ovens 11/19/2009 16:13:35  _____________________________________________________________________  External Attachment:    Type:   Image     Comment:   External Document

## 2010-12-09 NOTE — Cardiovascular Report (Signed)
Summary: Office Visit   Office Visit   Imported By: Roderic Ovens 06/30/2010 15:48:10  _____________________________________________________________________  External Attachment:    Type:   Image     Comment:   External Document

## 2010-12-09 NOTE — Cardiovascular Report (Signed)
Summary: TTM   TTM   Imported By: Roderic Ovens 09/03/2010 15:18:45  _____________________________________________________________________  External Attachment:    Type:   Image     Comment:   External Document

## 2010-12-14 ENCOUNTER — Encounter: Payer: Self-pay | Admitting: Cardiovascular Disease

## 2010-12-14 ENCOUNTER — Encounter (INDEPENDENT_AMBULATORY_CARE_PROVIDER_SITE_OTHER): Payer: MEDICARE

## 2010-12-14 DIAGNOSIS — I4891 Unspecified atrial fibrillation: Secondary | ICD-10-CM

## 2010-12-14 DIAGNOSIS — Z7901 Long term (current) use of anticoagulants: Secondary | ICD-10-CM

## 2010-12-15 NOTE — Procedures (Unsigned)
CAROTID DUPLEX EXAM  INDICATION:  Followup carotid disease.  HISTORY: Diabetes:  No. Cardiac:  No. Hypertension:  Yes. Smoking:  Quit. Previous Surgery:  Right CEA 2004. CV History: Amaurosis Fugax No, Paresthesias No, Hemiparesis No                                      RIGHT             LEFT Brachial systolic pressure:         122               130 Brachial Doppler waveforms:         WNL               WNL Vertebral direction of flow:        Antegrade         Abnormal antegrade DUPLEX VELOCITIES (cm/sec) CCA peak systolic                   97                102 ECA peak systolic                   157               112 ICA peak systolic                   55                313 ICA end diastolic                   13                74 PLAQUE MORPHOLOGY:                                    Heterogeneous PLAQUE AMOUNT:                      NA                Moderate PLAQUE LOCATION:                                      CCA/ICA/ECA  IMPRESSION: 1. Widely patent right carotid endarterectomy. 2. 60% to 79% left internal carotid artery stenosis. 3. Bilateral antegrade vertebral arteries, however, the left is highly     resistant. 4. Stable findings when compared to the exam of 12/02/2009.  ___________________________________________ Di Kindle. Edilia Bo, M.D.  LT/MEDQ  D:  12/06/2010  T:  12/06/2010  Job:  [id number]

## 2010-12-20 DIAGNOSIS — I4891 Unspecified atrial fibrillation: Secondary | ICD-10-CM

## 2010-12-20 DIAGNOSIS — Z7901 Long term (current) use of anticoagulants: Secondary | ICD-10-CM | POA: Insufficient documentation

## 2010-12-20 DIAGNOSIS — I679 Cerebrovascular disease, unspecified: Secondary | ICD-10-CM

## 2010-12-21 ENCOUNTER — Encounter: Payer: Self-pay | Admitting: Pulmonary Disease

## 2010-12-23 NOTE — Medication Information (Signed)
Summary: [facility]  Anticoagulant Therapy  Managed by: Cloyde Reams, RN, BSN Referring MD: Shawnie Pons MD PCP: [name], MD Supervising MD: Gala Romney MD, Reuel Boom Indication 1: Atrial Fibrillation Indication 2: Gastrointestinal Hemorrhage (ICD-578) Lab Used: [facility] Site: [facility] INR POC 2.4 INR RANGE 2 - 3  Dietary changes: no    Health status changes: no    Bleeding/hemorrhagic complications: no    Recent/future hospitalizations: no    Any changes in medication regimen? no    Recent/future dental: no  Any missed doses?: no       Is patient compliant with meds? yes       Allergies: No Known Drug Allergies  Anticoagulation Management History:      The patient is taking warfarin and comes in today for a routine follow up visit.  Positive risk factors for bleeding include an age of 79 years or older.  The bleeding index is 'intermediate risk'.  Positive CHADS2 values include Age > 93 years old.  The start date was 08/08/1999.  His last INR was 8.0 RATIO.  Anticoagulation responsible provider: Bensimhon MD, Reuel Boom.  INR POC: 2.4.  Cuvette Lot#: [id number].  Exp: 12/2011.    Anticoagulation Management Assessment/Plan:      The patient's current anticoagulation dose is Warfarin sodium 5 mg  tabs: as directed by the [facility]....  The target INR is 2.0-3.0.  The next INR is due 01/11/2011.  Anticoagulation instructions were given to patient.  Results were reviewed/authorized by Cloyde Reams, RN, BSN.  He was notified by Cloyde Reams, RN, BSN.         Prior Anticoagulation Instructions: INR 3.0  Coumadin 5 mg tablets - Continue 1 tablet every day except 1/2 tablet on Mondays and Fridays   Current Anticoagulation Instructions: INR 2.4  Continue on same dosage 1 tablet daily except 1/2 tablet on Mondays and Fridays.  Recheck in 4 weeks.

## 2011-01-04 NOTE — Consult Note (Signed)
Summary: [facility]  [facility]   Imported By: Sherian Rein 12/28/2010 11:35:39  _____________________________________________________________________  External Attachment:    Type:   Image     Comment:   External Document

## 2011-01-11 ENCOUNTER — Encounter (INDEPENDENT_AMBULATORY_CARE_PROVIDER_SITE_OTHER): Payer: Medicare Other

## 2011-01-11 ENCOUNTER — Encounter: Payer: Self-pay | Admitting: Cardiology

## 2011-01-11 DIAGNOSIS — Z7901 Long term (current) use of anticoagulants: Secondary | ICD-10-CM

## 2011-01-11 DIAGNOSIS — I4891 Unspecified atrial fibrillation: Secondary | ICD-10-CM

## 2011-01-17 ENCOUNTER — Encounter (INDEPENDENT_AMBULATORY_CARE_PROVIDER_SITE_OTHER): Payer: Medicare Other

## 2011-01-17 ENCOUNTER — Encounter: Payer: Self-pay | Admitting: Internal Medicine

## 2011-01-17 DIAGNOSIS — I442 Atrioventricular block, complete: Secondary | ICD-10-CM

## 2011-01-18 NOTE — Medication Information (Signed)
Summary: rov/tm  Anticoagulant Therapy  Managed by: Cloyde Reams, RN, BSN Referring MD: Shawnie Pons MD PCP: [name], MD Supervising MD: Antoine Poche MD, Fayrene Fearing Indication 1: Atrial Fibrillation Indication 2: Gastrointestinal Hemorrhage (ICD-578) Lab Used: [facility] Site: [facility] INR POC 2.8 INR RANGE 2 - 3  Dietary changes: no    Health status changes: no    Bleeding/hemorrhagic complications: no    Recent/future hospitalizations: no    Any changes in medication regimen? no    Recent/future dental: no  Any missed doses?: no       Is patient compliant with meds? yes       Allergies: No Known Drug Allergies  Anticoagulation Management History:      The patient is taking warfarin and comes in today for a routine follow up visit.  Positive risk factors for bleeding include an age of 75 years or older.  The bleeding index is 'intermediate risk'.  Positive CHADS2 values include Age > 25 years old.  The start date was 08/08/1999.  His last INR was 8.0 RATIO.  Anticoagulation responsible provider: Antoine Poche MD, Fayrene Fearing.  INR POC: 2.8.  Cuvette Lot#: [id number].  Exp: 11/2011.    Anticoagulation Management Assessment/Plan:      The patient's current anticoagulation dose is Warfarin sodium 5 mg  tabs: as directed by the [facility] Clinic....  The target INR is 2.0-3.0.  The next INR is due 02/08/2011.  Anticoagulation instructions were given to patient.  Results were reviewed/authorized by Cloyde Reams, RN, BSN.  He was notified by Cloyde Reams RN.         Prior Anticoagulation Instructions: INR 2.4  Continue on same dosage 1 tablet daily except 1/2 tablet on Mondays and Fridays.  Recheck in 4 weeks.    Current Anticoagulation Instructions: INR 2.8  Continue on same dosage 1 tablet daily except 1/2 tablet on Mondays and Fridays.  Recheck in 4 weeks.

## 2011-01-25 NOTE — Cardiovascular Report (Signed)
Summary: Office Visit   Office Visit   Imported By: Roderic Ovens 01/21/2011 16:06:00  _____________________________________________________________________  External Attachment:    Type:   Image     Comment:   External Document

## 2011-01-25 NOTE — Procedures (Signed)
Summary: PACER CHECK/ST.JUDE / R/S DUE TO EPIC/TMJ/nw   Current Medications (verified): 1)  Warfarin Sodium 5 Mg  Tabs (Warfarin Sodium) .... As Directed By The [facility].Marland KitchenMarland Kitchen 2)  Lisinopril 10 Mg  Tabs (Lisinopril) .... Take 1 Tab By Mouth Daily 3)  Simvastatin 40 Mg Tabs (Simvastatin) .... Take One Tablet By Mouth Daily At Bedtime 4)  Finasteride 5 Mg Tabs (Finasteride) .... Take 1 Tablet By Mouth Daily 5)  Aricept 10 Mg Tabs (Donepezil Hcl) .... Take 1 Tab By Mouth Once Daily.Marland KitchenMarland Kitchen 6)  Alprazolam 0.25 Mg  Tabs (Alprazolam) .... Take 1/2 To 1 Tab By Mouth Three Times A Day As Needed For Nerves.Marland KitchenMarland Kitchen 7)  Multivitamins   Tabs (Multiple Vitamin) .... Take 1 Tablet By Mouth Once A Day 8)  Mag-Ox 400 400 Mg Tabs (Magnesium Oxide) .Marland Kitchen.. 1 Two Times A Day 9)  Seroquel 25 Mg Tabs (Quetiapine Fumarate) .... Take 1-2 Tabs By Mouth At Bedtime As Directed... 10)  Mucinex Dm 30-600 Mg  Tb12 (Dextromethorphan-Guaifenesin) .... Take 1-2 Tabs By Mouth Two Times A Day As Needed For Congestion...  Allergies (verified): No Known Drug Allergies   PPM Specifications Following MD:  Sherryl Manges, MD     PPM Vendor:  [employer] Jude     PPM Model Number:  [id number]     PPM Serial Number:  [id number] PPM DOI:  10/19/2004     PPM Implanting MD:  Everardo Beals. Juanda Chance, MD  Lead 1    Location: [facility]     DOI: 02/20/1997     Model #: [id number]     Serial #: [id number]     Status: active Lead 2    Location: [facility]     DOI: 02/20/1997     Model #: 1342T     Serial #: [id number]     Status: active  Magnet Response Rate:  BOL 98.6 ERI  86.3  Indications:  PAF; CHB AV node ablation   Explantation Comments:  TTM's with [employer]  PPM Follow Up Pacer Dependent:  Yes      Episodes Coumadin:  Yes  Parameters Mode:  DDIR     Lower Rate Limit:  70     Upper Rate Limit:  130 Paced AV Delay:  170     Sensed AV Delay:  150 Tech Comments:  See PaceArt

## 2011-02-08 ENCOUNTER — Ambulatory Visit (INDEPENDENT_AMBULATORY_CARE_PROVIDER_SITE_OTHER): Payer: Medicare Other | Admitting: *Deleted

## 2011-02-08 DIAGNOSIS — I4891 Unspecified atrial fibrillation: Secondary | ICD-10-CM

## 2011-02-08 DIAGNOSIS — Z7901 Long term (current) use of anticoagulants: Secondary | ICD-10-CM

## 2011-02-08 DIAGNOSIS — I679 Cerebrovascular disease, unspecified: Secondary | ICD-10-CM

## 2011-02-08 NOTE — Patient Instructions (Signed)
Continue on same dosage 1 tablet daily except 1/2 tablet on Mondays and Fridays.  Recheck in 4 weeks 

## 2011-02-10 ENCOUNTER — Encounter: Payer: Self-pay | Admitting: Pulmonary Disease

## 2011-02-14 ENCOUNTER — Other Ambulatory Visit (INDEPENDENT_AMBULATORY_CARE_PROVIDER_SITE_OTHER): Payer: Medicare Other

## 2011-02-14 ENCOUNTER — Encounter: Payer: Self-pay | Admitting: Pulmonary Disease

## 2011-02-14 ENCOUNTER — Ambulatory Visit (INDEPENDENT_AMBULATORY_CARE_PROVIDER_SITE_OTHER): Payer: Medicare Other | Admitting: Pulmonary Disease

## 2011-02-14 DIAGNOSIS — I2510 Atherosclerotic heart disease of native coronary artery without angina pectoris: Secondary | ICD-10-CM

## 2011-02-14 DIAGNOSIS — D649 Anemia, unspecified: Secondary | ICD-10-CM

## 2011-02-14 DIAGNOSIS — E7800 Pure hypercholesterolemia, unspecified: Secondary | ICD-10-CM

## 2011-02-14 DIAGNOSIS — I679 Cerebrovascular disease, unspecified: Secondary | ICD-10-CM

## 2011-02-14 DIAGNOSIS — I4891 Unspecified atrial fibrillation: Secondary | ICD-10-CM

## 2011-02-14 DIAGNOSIS — R413 Other amnesia: Secondary | ICD-10-CM

## 2011-02-14 DIAGNOSIS — N401 Enlarged prostate with lower urinary tract symptoms: Secondary | ICD-10-CM

## 2011-02-14 LAB — LIPID PANEL
LDL Cholesterol: 76 mg/dL (ref 0–99)
Total CHOL/HDL Ratio: 3
Triglycerides: 156 mg/dL — ABNORMAL HIGH (ref 0.0–149.0)

## 2011-02-14 LAB — CBC WITH DIFFERENTIAL/PLATELET
Basophils Relative: 0.2 % (ref 0.0–3.0)
Eosinophils Relative: 1.4 % (ref 0.0–5.0)
Lymphocytes Relative: 21.8 % (ref 12.0–46.0)
MCV: 97.1 fl (ref 78.0–100.0)
Monocytes Absolute: 0.6 10*3/uL (ref 0.1–1.0)
Monocytes Relative: 6.2 % (ref 3.0–12.0)
Neutrophils Relative %: 70.4 % (ref 43.0–77.0)
RBC: 4.39 Mil/uL (ref 4.22–5.81)
WBC: 9.5 10*3/uL (ref 4.5–10.5)

## 2011-02-14 LAB — HEPATIC FUNCTION PANEL
ALT: 16 U/L (ref 0–53)
AST: 21 U/L (ref 0–37)
Alkaline Phosphatase: 67 U/L (ref 39–117)
Bilirubin, Direct: 0.1 mg/dL (ref 0.0–0.3)
Total Protein: 6.8 g/dL (ref 6.0–8.3)

## 2011-02-14 LAB — BASIC METABOLIC PANEL
Chloride: 100 mEq/L (ref 96–112)
Creatinine, Ser: 1 mg/dL (ref 0.4–1.5)
Potassium: 4.5 mEq/L (ref 3.5–5.1)

## 2011-02-14 MED ORDER — DARIFENACIN HYDROBROMIDE ER 7.5 MG PO TB24: 7.5000 mg | ORAL_TABLET | Freq: Every day | ORAL | Status: DC

## 2011-02-14 MED ORDER — LISINOPRIL 10 MG PO TABS: 10.0000 mg | ORAL_TABLET | Freq: Every day | ORAL | Status: DC

## 2011-02-14 MED ORDER — AZELASTINE HCL 0.15 % NA SOLN: NASAL | Status: DC

## 2011-02-14 MED ORDER — QUETIAPINE FUMARATE 50 MG PO TABS: 50.0000 mg | ORAL_TABLET | Freq: Every day | ORAL | Status: DC

## 2011-02-14 MED ORDER — DONEPEZIL HCL 10 MG PO TABS: 10.0000 mg | ORAL_TABLET | Freq: Every evening | ORAL | Status: DC | PRN

## 2011-02-14 NOTE — Patient Instructions (Signed)
Today we up dated your meds in EPIC...    We refilled the meds you requested & we increased the Seroquel to 50mg  at bedtime...    We decided to try ASTEPRO nasal spray for your runny nose, and ENABLEX one tab at bedtime for your bladder symptoms to see if we can keep you "dry"...  Today we did your follow up fasting blood work...    Please call the [facility] in a few days for your results...    Dial [id number] & when prompted enter your patient number followed by the # symbol...    Your patient number is:  [id number]  Call for any questions... Let's plan a follow up visit in 6 months.Marland KitchenMarland Kitchen

## 2011-02-14 NOTE — Progress Notes (Signed)
Subjective:    Patient ID: Ricky [name], male    DOB: [birth year], 77 y.o.   MRN: [id number]  HPI 77 y/o WM here for a follow up visit... he has multiple medical problems including:  OSA;  CAD- s/p CABG;  Hx AFib/ CHB/ Pacer;  Cerebrovasc dis- s/p RtCAE;  Hyperchol;  HH/ Gastritis/ hx gastric volvulus w/ surg2009;  Divertics/ colon polyps;  BPH w/ boo;  DJD;  Early dementia w/ personality changes;  Hx anemia w/ Fe malaborption- resolved...  ~  August 13, 2010:  he had f/u appts w/ [name] & [name]- stable cardiac, 2DEcho w/ mild LVH (see report), [name] had lengthy discussion & rec antidepressant Rx but clearly he is demented & wife's afraid of his aggression & belligerence> rec- Neuro eval & trial Seroquel in the interim... f/u fasting labs (all essent WNL), & OK Flu shot...  ~  February 14, 2011:  55mo ROV & doing satis- he has no real complaints mostly due to his dementia, wife notes that he has some incontinence esp in AM & he is freq "wet" so we discussed trial of Enablex 7.5mg  to OB;  Also not resting at night & wife is still having a rough time w/ his behavior & requesting incr Seroquel to 50mg  Qhs;  He had decr hearing assoc w/ bilat cerumen impactions- removed by Gayla Medicus 2/12 & improved;  Finally he notes runny nose as well & while wife thinks it's not that bad- he wants Rx & we wrote for Astepro Prn use...      Problem List:  OBSTRUCTIVE SLEEP APNEA (ICD-327.23) - sleep study 5/03 showed RDI=27 w/ desat to 86% & mod snoring... he refused CPAP Rx, and denies daytime hypersomnolence... apparently snoring doesn't disturb wife & pt rests adeq but wakes daily at 4-5AM, "I sleep better in my recliner in front of the TV"... he does not want further intervention.  ATHEROSCLEROTIC HEART DISEASE (ICD-414.00) & CARDIOMYOPATHY, ISCHEMIC S/P CABG (ICD-414.8) - on LISINOPRIL 10mg /d; followed by [name] Graciela Husbands for Cardiology...  ~  s/p CABG 9/00 and AFlutter ablation w/ pacer in 1998... ~  8/11:  f/u  by [name]- stable, no changes made... ~  2DEcho 9/11 showed mild LVH, EF=55% w/ dyssynergy c/w RV pacing, biatrial enlargem, mild RV dil & pulm HTN.  ATRIAL FIBRILLATION, HX OF (ICD-V12.59), AV BLOCK, COMPLETE (ICD-426.0), & PACEMAKER, PERMANENT DDD STJ (ICD-V45.01) - on COUMADIN and followed [facility]... he has a pacemaker for complete heart block & continues w/ regular pacer checks per Cards.  CEREBROVASCULAR DISEASE (ICD-437.9) - on ASA 81mg /d... he had a TIA in 2004 w/ carotid eval showing high grade right carotid obstruction... s/p right CAE by DrCDickson 10/04... CDopplers per [facility]... ~  CDoppler 1/09 showed 60-79% left ICA stenosis, & normal right ICA- s/p surg. ~  CDoppler 1/10 showed  patent right ICA with no evidence of restenosis, 60-79% stenosis of the left ICA. ~  CT Brain 10/10 showed atrophy & sm vessel dis, NAD. ~  CDoppler 1/11 showed patent right CAE site with no ICA stenosis, 60%-79% stenosis of the left ICA (no change from 7/10).  HYPERCHOLESTEROLEMIA (ICD-272.0) - on SIMVASTATIN 40mg /d + low chol diet... ~  FLP 7/08 on Lip40 showed TChol 143, TG 173, HDL 40, LDL 69 ~  FLP 1/09 showed TChol 176, TG 155, HDL 42, LDL 103... Lipitor changed to Simva80 for $$$. ~  FLP 7/09 on Simva80 showed TChol 120, TG 150, HDL 42, LDL 48... rec- keep  same. ~  FLP 1/10 on Simva80 showed TChol 149, TG 143, HDL 44, LDL 77 ~  9/10: [name] decr his Simva80 to Simva40- wife states "the government cut it back"... ~  FLP 10/10 on Simva40 showed TChol 183, TG 199, HDL 45, LDL 99 ~  FLP 10/11 on Simva40 showed TChol 160, TG 196, HDL 54, LDL 67 ~  FLP 4/12 on Simva40 showed TChol 153, TG 156, HDL 46, LDL 76  GASTRITIS (ICD-535.50) - on OMEPRAZOLE 20mg /d... prev treated for HPylori in 2007...he has a large HH and last EGD 6/08 by Dorris Singh revealed an esoph stricture- dilated... hosp 4/09 w/ part gastric volvulus and most of stomach in chest... EGD showed massive HH, no lesions seen...  Aug09 [name] did laparosopically reduced HH w/ repair of the hiatus & gastropexy to keep it reduced (Nissen not done).  DIVERTICULOSIS OF COLON (ICD-562.10) & COLONIC POLYPS (ICD-211.3) - 3mm adenomatous polyp removed from ascending colon in 2001... last colonoscopy by [facility] 4/04 w/ divertics + polyp... he's been eval for subseq rectal bleeding w/ fissure dx'd and Rx w/ botox 7/08 by [name]... he uses PROCTOCORT Cream Prn...  BENIGN PROSTATIC HYPERTROPHY, WITH OBSTRUCTION (ICD-600.01) - eval by DrPeterson 2010 for obstructive symptoms and hematuria (related to his coumadin)... neg cysto & started on AVODART 5mg /d... ~  labs 10/11 showed PSA= 1.00 ~   4/12:  new overactive bladder symptoms & we decided to try Enablex Qhs...   DEGENERATIVE JOINT DISEASE (ICD-715.90)  MEMORY LOSS (ICD-780.93) - 10/10 OV wife voiced concern regarding memory loss & personality change- more argumentative, belligerent etc... MMSE showed changes and we decided to Rx w/ ARICEPT 10mg /d... ~  12/11:  He had eval DrDohmeier> note reviewed, she agreed w/ Aricept, consider Namenda; she checked EEG- neg, non focal...  ANXIETY (ICD-300.00) - he's on ALPRAZOLAM 0.25mg  as needed.  Hx of ANEMIA (ICD-285.9) - he has been eval by [name] w/ an iron malabsorption anemia (bone marrow in 2001 w/ absent iron stores)... prev Rx w/ parenteral iron infusions... ~  labs 10/10 by [name] showed Hg= 14.6, Fe= 107, Ferritin= 120... ~  labs here 10/11 showed Hg= 15.2, MCV= 97 > hasn't required IV Fe in over 65yrs & [name] signed off. ~  Labs here 4/12 showed Hg= 14.4  SHINGLES (ICD-053.9) - presented 4/11 w/ right C2 shingles> Rx w/ ACYCLOVIR, MEDROL, VICODIN... we discussed Shingles Vaccine as well & they will think about it...  Health Maintenance ~  Immunizations:  had PNEUMOVAX 2007... received 2010 Flu shot 10/8...   Past Surgical History  Procedure Date  . Pacemaker insertion   . Coronary artery bypass  graft     x4 by Dr. Sena Hitch 9/00  . Carotid endarterectomy     right 10/04 Dr. Edilia Bo  . Anal fissure repair   . Hiatal hernia repair     incarcerated stomach with takedown and repair of hiatus and gastropexy    Outpatient Encounter Prescriptions as of 02/14/2011  Medication Sig Dispense Refill  . acetaminophen (TYLENOL) 500 MG tablet Take 500 mg by mouth daily.        Marland Kitchen ALPRAZolam (XANAX) 0.25 MG tablet 1/2 tablet to 1 tablet by mouth three times a day as needed for nerves.       Marland Kitchen dextromethorphan-guaiFENesin (MUCINEX DM) 30-600 MG per 12 hr tablet Take 1 tablet by mouth every 12 (twelve) hours.        . Diphenhydramine-APAP, sleep, (TYLENOL PM EXTRA STRENGTH PO) Take 1 tablet by mouth at bedtime as  needed.        . donepezil (ARICEPT) 10 MG tablet Take 10 mg by mouth at bedtime as needed.        . finasteride (PROSCAR) 5 MG tablet Take 5 mg by mouth daily.        Marland Kitchen lisinopril (PRINIVIL,ZESTRIL) 10 MG tablet Take 10 mg by mouth daily.        . magnesium oxide (MAG-OX) 400 MG tablet Take 400 mg by mouth 2 (two) times daily.        . Multiple Vitamins-Minerals (MULTIVITAMIN WITH MINERALS) tablet Take 1 tablet by mouth daily.        . QUEtiapine (SEROQUEL) 25 MG tablet Take 25 mg by mouth at bedtime.        . simvastatin (ZOCOR) 40 MG tablet Take 40 mg by mouth at bedtime.        Marland Kitchen warfarin (COUMADIN) 5 MG tablet Take by mouth as directed.          No Known Allergies   Review of Systems       See HPI - all other systems neg except as noted...       For his part, Ricky [name] is mostly asymptomatic; but as noted he has a mild dementia...  (MMSE (08/14/09):  Date= Oct 8th, 2010; oriented x3;  Pres= Obama, VP= ?, prev Pres= ?;  3/3 objects immed & 3/3 after distraction;  serial 7's= 100 93 86 ?;  WORLD backwards= ?;  proverbs= concrete interpret)  (MMSE (12/16/09):  Improved> VP= Biden, PrevPres= Bush, Serial 7's= 100, 93, 59no79, 71; WORLD backwards= DLROD)   Objective:   Physical  Exam     WD, Overwt, 77 y/o WM in NAD... GENERAL:  Alert, pleasant, & cooperative... HEENT:  [place]/AT, EOM-wnl, PERRLA, EACs-clear, TMs-wnl, NOSE-clear, THROAT-clear & wnl. NECK:  Supple w/ fairROM; no JVD; right carotid scar, norm impulses w/o bruits; no thyromegaly or nodules palpated; no lymphadenopathy. CHEST:  Clear to P & A; without wheezes/ rales/ or rhonchi... HEART:  Median sternotomy scar, regular rhythm; without murmurs/ rubs/ or gallops heard... ABDOMEN:  Soft & nontender; normal bowel sounds; no organomegaly or masses detected. EXT:  mild arthritic changes; no varicose veins/ +venous insuffic/ tr edema. NEURO:  CN's intact;  no focal neuro deficits detected... DERM:  excoriated blisters c/o shingles over the right post scapl & neck> C2 distrib...   Assessment & Plan:            CAD>  Followed by Dr [name] & stable;  He denies angina, palpit, SOB, dizzy, syncope, edmea, etc...  PACER>  He CHB & PAF;  Followed by [name] & stable, doing well...  CEREBROVASC Dis>  On ASA 81mg /d & Coumadin via the CC;  He denies all cerebral ischemic symptoms...  CHOL>  He remains stable on the Simva40...  GI>  Stable w/o new complaints or concerns...  GU>  As noted- he has some symptoms suggesting overactive bladder & we decided to try Enablex 7.5mg /d...  Dementia>  eval by DrDohmeier reviewed... Continue current therapy Aricept, Seroquel, Alpraz...  Anemia>  Prev Fe malabsorption prob but Hg stable x yrs & hasn't required any parenteral Fe.Marland KitchenMarland Kitchen

## 2011-02-19 ENCOUNTER — Encounter: Payer: Self-pay | Admitting: Pulmonary Disease

## 2011-03-08 ENCOUNTER — Ambulatory Visit (INDEPENDENT_AMBULATORY_CARE_PROVIDER_SITE_OTHER): Payer: Medicare Other | Admitting: *Deleted

## 2011-03-08 DIAGNOSIS — I679 Cerebrovascular disease, unspecified: Secondary | ICD-10-CM

## 2011-03-08 DIAGNOSIS — Z7901 Long term (current) use of anticoagulants: Secondary | ICD-10-CM

## 2011-03-08 DIAGNOSIS — I4891 Unspecified atrial fibrillation: Secondary | ICD-10-CM

## 2011-03-22 NOTE — H&P (Signed)
NAME:  EBIN, PALAZZI NO.:  [id number]   MEDICAL RECORD NO.:  [id number]          PATIENT TYPE:  EMS   LOCATION:  [facility]JO                         FACILITY:  [facility]   PHYSICIAN:  Iva Boop, MD,FACGDATE OF BIRTH:  1931-07-04   DATE OF ADMISSION:  02/08/2008  DATE OF DISCHARGE:                              HISTORY & PHYSICAL   ADDENDUM TO HISTORY AND PHYSICAL   Regarding his left lower extremity lesion, this will need to be looked  at as an outpatient most likely.  We will defer any referrals there to  Dr. Kriste Basque.  I am not sure what it is, it could be some type of an early  skin malignancy, it is firm, it is not tender.      Iva Boop, MD,FACG  Electronically Signed     CEG/MEDQ  D:  02/08/2008  T:  02/08/2008  Job:  [id number]   cc:   [name], MD  Dr. Arlyce Dice (which one)?

## 2011-03-22 NOTE — Assessment & Plan Note (Signed)
[facility]                             PULMONARY OFFICE NOTE   BRACK, SHADDOCK                         MRN:          [id number]  DATE:09/10/2007                            DOB:          12-14-1930    HISTORY OF PRESENT ILLNESS:  The patient is a 77 year old white male  patient of Dr. Jodelle Green who has a known history of hypertension,  hyperlipidemia, and gastroesophageal reflux who presents today for an  acute office visit.  The patient complains that over the past 2 weeks he  has had lower back pain that has increased with movement, is quite sore  to touch on the right lower lumbosacral region.  The patient denies any  known injury, radicular symptoms, abdominal pain, chest pain, shortness  of breath.  The patient reports that he has had previous episodes  similar to this in the past with muscle spasms.  The patient also has  noticed over the last several weeks he has had some difficulty with  urination.  This does seem to be slowly progressive in nature with some  mild intermittent dysuria.  The patient did notice that he has a small  irritation along the distal tip of his penis.  The patient is  uncircumcised.  The patient denies any hematuria, discharge, testicle  pain, or swelling.  The patient is married; however, he is not sexually  active.  He has a history of erectile dysfunction due to underlying  cardiac issues.  This has been chronic for 12 years.   PAST MEDICAL HISTORY:  Reviewed.   CURRENT MEDICATIONS:  Reviewed.   PHYSICAL EXAMINATION:  GENERAL:  The patient is a pleasant male in no  acute distress.  VITAL SIGNS:  He is afebrile with stable vital signs.  O2 saturation is  96% on room air.  HEENT:  Unremarkable.  NECK:  Supple without cervical adenopathy.  No JVD.  LUNGS:  Lungs sounds are diminished at the bases.  Otherwise clear.  CARDIAC:  Regular rate.  ABDOMEN:  Soft and nontender.  EXTREMITIES:  Warm without any calf  tenderness, cyanosis, clubbing, or  edema.  Moves all extremities well.  Equal strength of the upper and  lower extremities.  Negative straight leg raises.  DTRs are 2+  throughout.  Along the right lumbosacral area, the patient has  significant tenderness with reproducible symptoms with movement.  No  ecchymosis, deformity is noted.  SKIN:  Without rash.  GENITALIA:  Well-descended testes.  No palpable hernia noted.  The  patient has a small excoriation around the distal tip of the penis along  skin folds.  It seems to be without any discharge noted.  Note, the  patient is uncircumcised.   IMPRESSION AND PLAN:  1. Right lumbosacral back pain.  Suspect is musculoskeletal in nature.      The patient is to begin Motrin 600 mg b.i.d. with food x5 days.      Note, the patient is on chronic Coumadin therapy.  We will need to      monitor closely, and the patient is  to report if has any episodes      of bleeding.  The patient is to use Skelaxin 800 mg up to 3 times a      day as needed.  Vicodin one-half to whole up to every 6 hours as      needed.  The patient aware of sedating effect.  The patient is      alternating ice and heat.  If his symptoms do not improve in the      next 1-2 weeks, he may need referral to orthopedist.  The patient      will follow back up at that time or sooner if symptoms do not      improve or worsen.  2. Mild dysuria and urine hesitancy.  Will check a urinalysis today to      rule out possible underlying infection.  Will followup clinically.  3. Small excoriation along the distal tip of the penis.  Suspect      excoriation from irritation from skin fold from uncircumcision.      The patient is advised on genital hygiene and is advised to use      some Neosporin along the site.  If symptoms do not improve, will      need referral to a urologist.      Rubye Oaks, NP  Electronically Signed      [name], MD  Electronically Signed   TP/MedQ   DD: 09/10/2007  DT: 09/11/2007  Job #: [id number]

## 2011-03-22 NOTE — Consult Note (Signed)
NAME:  Ricky [name], Ricky [name] NO.:  [id number]   MEDICAL RECORD NO.:  [id number]          PATIENT TYPE:  INP   LOCATION:  5151                         FACILITY:  [facility]   PHYSICIAN:  Sandria Bales. Ezzard Standing, M.D.  DATE OF BIRTH:  [birth year]   DATE OF CONSULTATION:  DATE OF DISCHARGE:                                 CONSULTATION   Date of Consultation??   REFERRING PHYSICIAN:  Iva Boop, MD,FACG   REASON FOR CONSULTATION:  Large hiatal hernia.   HISTORY OF ILLNESS:  Ricky [name] is a 77 year old white male who is a  patient of Dr. [name] and has seen Dr. Shawnie Pons from the  cardiac standpoint in the past.   He had 2 bouts of epigastric pain, nausea and vomiting on Tuesday, the  31st of March, 2009, the second one on Friday, the 3rd of April, 2009.  He came to the [facility] Emergency Room where he was admitted by Dr.  Stan Head.  He may have had some vague epigastric symptoms over the  last several months, but it is hard to tell how much these are related  to the hiatel henia.  There was concern when Dr. Leone Payor was initially  consulted for the coffee ground emesis with a risk of him bleeding.  Dr. Leone Payor did an upper endoscopy and saw a majority of the patient's  stomach in his chest.  On endoscopy, he saw no reason for blood loss.  He was no ulcer, no ischemia, and no bleeding.   However, I spoke with Dr. Leone Payor later and he thought probably it was  just old food, not actually blood in his vomitus.   Ricky [name] was seen by Dr. Sheron Nightingale around 2000 for the hiatel hernia.  But at that time the patient had just had a CABG and the decision was  made to do nothing.  The patient has done well for 8 to 9 years.  He  denied any history of peptic ulcer disease though apparently he has had  some esophageal dilatations in the past by Dr. Arlyce Dice.  He has had no  history of liver disease, pancreatic disease, or colon disease.  He  actually has an appointment I think  to see Dr. Arlyce Dice in the next week  or so to consider another colonoscopy.   ALLERGIES:  He has an allergy to PENICILLIN.   CURRENT MEDICATIONS:  1. Hydrobromide 20 mg  daily.  2. Alprazolam 0.5 mg daily.  3. Prilosec over-the-counter.  4. Mucinex.  5. Lisinopril 10 mg daily.  6. Simvastatin 80 mg daily.  7. Coumadin 5 mg daily.   REVIEW OF SYSTEMS:  NEUROLOGIC:  He denies any history of stroke or  seizure, but he did have a left carotid endarterectomy by Dr. Edilia Bo.  CARDIAC:  He has had a 4 vessel CABG by Dr. Tyrone Sage in 2000, most  recently revised in 2005 by Dr. Graciela Husbands.  He apparently has atrial  fibrillation. This is why he is on chronic Coumadin.  His initial PT,  however, showed an INR of 1.5  and a PT of 18.  PULMONARY:  He has a history of obstructive sleep apnea, but apparently  does not want to wear a mask.  He was tested about 3 years ago for this.  He quit smoking about 1998.  GASTROINTESTINAL:  See History of Present Illness.  He saw Dr. Wenda Low in approximately 2000-20001 for the hiatal hernia.  I think  because he was recovering from his cardiac and his long term prognosis  was so unknown at that time, there was no decision for surgery, but he  is not going back to see Dr. Daphine Deutscher with any further discussion of a  hiatal hernia that was known of at least in 2000.  UROLOGIC:  No evidence of kidney stones or kidney infections.  MUSCULOSKELETAL:  He has had bilateral inguinal hernia repairs.  He says  he thinks he has a recurrence on his left side.  He had an appendectomy  as a child.   His wife is at his bedside.   PHYSICAL EXAM:  His temperature is 97.6, pulse 76, blood pressure  175/102.  He is a well-nourished, pleasant white male, alert and cooperative with  physical exam.  HEENT:  Unremarkable.  He has a scar on his left neck consistent with  his carotid endarterectomy.  I felt no thyroid mass.  He has a pacemaker in his subclavian area.  LUNGS:  Clear  to auscultation and symmetric.  HEART:  Regular, rate and rhythm without murmur or rub.  ABDOMEN:  Soft.  He is moderately obese.  He has no tenderness, no  guarding, no rebound, no abdominal mass.  He may have an early left inguinal hernia.  I did do an exam over this  in supine position only.  EXTREMITIES:  He has a little inflamed or cyst on his left calf  laterally.  He has good strength in the upper and lower extremities.   LABS:  Hemoglobin 12, hematocrit 38, white blood count 8200.  Again, his  PT was 18, INR of 1.5.  His sodium was 141, potassium 3.8, chloride of  98.  His creatinine is 0.93.  His albumin is 4.2.  His lipase is 41.   IMPRESSION:  1. The patient has a large hiatal hernia seen both on upper endoscopy      by Dr. Leone Payor and by chest x-ray.  I spoke with Dr. Leone Payor and I      think the patient is safe was to be discharged home.  There is no evidence of any ischemia or ulcer within the hiatal hernia.  I think that the decision for surgery with require a long term  discussion with Ricky [name] about the pros and cons of hiatal hernia  repair.  He is somewhat ambivalent about the surgery.  The risks include  bleeding, infection, perforation of the bowel, failure of the hiatal  hernia repair and some persistence of symptoms even after hiatal hernia  repair.  The benefits would be a probable improvement in the symptoms  that he has had and possibly reducing the risk of some type of acute  obstruction.  I think he would be best served with an upper GI to document the  anatomy, a CT scan to make sure there is no other extraluminal problem  to worry about, and to have Dr. [name] or one of his Cardiology  partners see him for cardiac clearance for major abdominal surgery.  1. Atrial fibrillation, known coronary artery disease:  He needs  to      obtain cardiac clearance prior to any major surgery.  2. On Coumadin for atrial fibrillation.  3. Status post left carotid  endarterectomy that he has done well with.  4. Probable recurrent left inguinal hernia.  5. Infected cyst, left calf.  6. Obstructive sleep apnea, but not on CPAP or refuses CPAP.  7. History of hypercholesterolemia.  8. History of anxiety.      Sandria Bales. Ezzard Standing, M.D.  Electronically Signed     DHN/MEDQ  D:  02/09/2008  T:  02/09/2008  Job:  [id number]   cc:   Iva Boop, MD,FACG  Barbette Hair. Arlyce Dice, MD,FACG  [name], MD  [name]. [name], MD, [facility]  [place]. Daphine Deutscher, MD

## 2011-03-22 NOTE — Procedures (Signed)
CAROTID DUPLEX EXAM   INDICATION:  Follow up carotid artery disease.   HISTORY:  Diabetes:  No  Cardiac:  CAD  Hypertension:  Yes  Smoking:  Quit  Previous Surgery:  Right carotid endarterectomy in 2004.  CV History:  No  Amaurosis Fugax:  No.  Paresthesias:  No.  Hemiparesis:  No.                                       RIGHT             LEFT  Brachial systolic pressure:         136               142  Brachial Doppler waveforms:         Within normal limits.               Within normal limits.  Vertebral direction of flow:        Antegrade.        Antegrade.  DUPLEX VELOCITIES (cm/sec)  CCA peak systolic                   111               104  ECA peak systolic                   161               133  ICA peak systolic                   66                252  ICA end diastolic                   17                63  PLAQUE MORPHOLOGY:                  None              Heterogeneous.  PLAQUE AMOUNT:                      None              Moderate to  severe.  PLAQUE LOCATION:                    None              CCA, BIF, ICA,  ECA.   IMPRESSION:  Patent right internal carotid artery with no evidence of  restenosis.  60-79% stenosis of the left internal carotid artery.       ___________________________________________  Quita Skye Hart [place], M.D.   AC/MEDQ  D:  11/25/2008  T:  11/25/2008  Job:  [id number]

## 2011-03-22 NOTE — Procedures (Signed)
CAROTID DUPLEX EXAM   INDICATION:  Followup known carotid artery disease.   HISTORY:  Diabetes:  No.  Cardiac:  Coronary artery disease.  Hypertension:  Yes.  Smoking:  No.  Previous Surgery:  Right carotid endarterectomy in 2004.  CV History:  No.  Amaurosis Fugax No, Paresthesias No, Hemiparesis No                                       RIGHT             LEFT  Brachial systolic pressure:         140               130  Brachial Doppler waveforms:         Biphasic          Biphasic  Vertebral direction of flow:        Antegrade         Antegrade  DUPLEX VELOCITIES (cm/sec)  CCA peak systolic                   99                100  ECA peak systolic                   170               157  ICA peak systolic                   59                220  ICA end diastolic                   None              47  PLAQUE MORPHOLOGY:                  None              Calcified  PLAQUE AMOUNT:                      None              Moderate  PLAQUE LOCATION:                    None              ICA, ECA and CCA   IMPRESSION:  60-79% stenosis noted in the left ICA.  Normal carotid  duplex noted in the right ICA.  Status post right carotid  endarterectomy.  Antegrade bilateral vertebral arteries.   ___________________________________________  Di Kindle. Edilia Bo, M.D.   MG/MEDQ  D:  12/04/2007  T:  12/05/2007  Job:  [id number]

## 2011-03-22 NOTE — Assessment & Plan Note (Signed)
 HEALTHCARE                         GASTROENTEROLOGY OFFICE NOTE   TIGHE, GITTO                         MRN:          [id number]  DATE:02/11/2008                            DOB:          11-12-1930    PROBLEM:  Nausea and vomiting.   Ricky [name] was discharged 2 days ago from [facility] after  getting admitted for protracted nausea and vomiting.  He underwent upper  endoscopy and it was felt that he has a partial gastric volvulus.  Most  of his stomach is in his chest.  No bleeding was seen.  Ricky [name] has  had several episodes of vomiting.  He has minimal to no chest pain.   OTHER GASTROINTESTINAL ISSUES:  1. Include iron - deficiency anemia.  2. Adenomatous colon polyps.  Last colonoscopy was in 2004.   OTHER MEDICAL PROBLEMS:  1. Coronary artery disease - status post CABG.  2. Atrial fibrillation.  Patient is on Coumadin.  3. CVA with a carotid artery stenosis - status post carotid      endarterectomy.  4. Sleep apnea.   PHYSICAL EXAMINATION:  Pulse 72.  Blood pressure 136/70.  Weight 214.  He has 1+ pedal edema.  HEENT:  EOMI.  PERRLA.  Sclerae are anicteric.  Conjunctivae are pink.  NECK:  Supple without thyromegaly, adenopathy or carotid bruits.  CHEST:  Clear to auscultation and percussion without adventitious  sounds.  CARDIAC:  Regular rhythm; normal S1 S2.  There are no murmurs, gallops  or rubs.  ABDOMEN:  Bowel sounds are normoactive.  Abdomen is soft, nontender and  nondistended.  There are no abdominal masses, tenderness, splenic  enlargement or hepatomegaly.  EXTREMITIES:  Full range of motion.  No cyanosis, clubbing or edema.  RECTAL:  Deferred   IMPRESSION:  1. Intermittent nausea and vomiting.  I suspect that he may have a      transient partial gastric volvulus.  2. History of colon polyps.  3. Iron-deficiency anemia.  4. Coronary artery disease.  5. Status post cerebrovascular accident.  6. Atrial  fibrillation - on Coumadin   RECOMMENDATION:  Upper GI series.     Barbette Hair. Arlyce Dice, MD,FACG  Electronically Signed    RDK/MedQ  DD: 02/11/2008  DT: 02/11/2008  Job #: [id number]

## 2011-03-22 NOTE — Assessment & Plan Note (Signed)
[facility]                            CARDIOLOGY OFFICE NOTE   Ricky, [name]                         MRN:          [id number]  DATE:12/29/2008                            DOB:          1930-12-10    Ricky [name] is in for a year followup.  In general, he is doing pretty  well.  He has no specific complaints at the present time.  He denies any  ongoing chest pain.  The patient has had some recent laboratories with  an LDL of 77 with a total cholesterol 149 and triglycerides of 143.  His  liver functions have been unremarkable, and his renal function has been  intact.  He has also had followup with the vascular surgeons and has 60-  79% stenosis of the left internal carotid and patent right internal  carotid without evidence of restenosis.   Current medications include:  1. Alprazolam 0.25 daily.  2. Multivitamin.  3. Mucinex.  4. Lisinopril 10 daily.  5. Simvastatin 80 mg daily.  6. Coumadin 5 daily.  7. Metoclopramide daily.   On physical examination, he is alert and oriented in no distress.  In  general, he is his usual relatively jovial self.  Blood pressure is  126/80, the pulse is 69.  The lung fields actually are quite clear.  The  cardiac rhythm currently is regular.  He has trace lower extremity  edema.   His underlying rhythm appears to be atrial fibrillation with ventricular  pacing.  He has complete heart block and is pacer dependent.  He is in  DDDR mode with low and high rates of 70 and 130.  He is ventricular  pacing 100% of the time and atrial pacing about 67% of the time.   IMPRESSION:  1. Coronary artery disease status post coronary artery bypass surgery      in September 2000.  2. Hypercholesterolemia on lipid-lowering therapy.  3. History of atrial fibrillation, paroxysmal, on chronic Coumadin      anticoagulation.  Carotid surgery status post carotid      endarterectomy with residual stenosis followed by vein and  vascular      specialists.   PLAN:  1. Return to clinic in 1 year.  2. CBC, basic metabolic profile.  3. Continued followup in [facility].     [name]. [name], MD, [facility]  Electronically Signed    TDS/MedQ  DD: 01/18/2009  DT: 01/19/2009  Job #: [id number]   cc:   [name], MD

## 2011-03-22 NOTE — Procedures (Signed)
CAROTID DUPLEX EXAM   INDICATION:  Followup evaluation of known carotid artery disease.   HISTORY:  Diabetes:  No.  Cardiac:  CAD.  Hypertension:  Yes.  Smoking:  Quit.  Previous Surgery:  Right carotid endarterectomy in 2004.  CV History:  No.  Amaurosis Fugax No, Paresthesias No, Hemiparesis No                                       RIGHT             LEFT  Brachial systolic pressure:         156               152  Brachial Doppler waveforms:         WNL               WNL  Vertebral direction of flow:        Antegrade         Antegrade  DUPLEX VELOCITIES (cm/sec)  CCA peak systolic                   132               118  ECA peak systolic                   168               128  ICA peak systolic                   86                274  ICA end diastolic                   24                56  PLAQUE MORPHOLOGY:                  None              Calcified  PLAQUE AMOUNT:                      None              Moderate  PLAQUE LOCATION:                    None              Bif/ICA/ECA   IMPRESSION:  1. Patent right internal carotid artery with no evidence of      restenosis.  2. 60-79% left internal carotid artery stenosis.        ___________________________________________  Quita Skye Hart [place], M.D.   AC/MEDQ  D:  05/26/2009  T:  05/27/2009  Job:  [id number]

## 2011-03-22 NOTE — Assessment & Plan Note (Signed)
[facility]                         GASTROENTEROLOGY OFFICE NOTE   GARNET, OVERFIELD                         MRN:          [id number]  DATE:04/30/2007                            DOB:          Jul 28, 1931    PROBLEMS:  1. Vomiting.  2. Rectal pain.   Mr. Brickle has returned for reevaluation.  Upper endoscopy demonstrated a  distal esophageal stricture, which was dilated to 18 mm.  He no longer  has dysphagia but he has had two occasions of postprandial vomiting of  undigested food.  This occurred 2-3 hours after eating.  Rectal pain  continues.  With defecation he has sharp pain and a small amount of  bright red blood per rectum.  He has been taking Anusol suppositories  and Lotrimin cream to the perianal area.  A gastric emptying scan in  April 2007 demonstrated borderline delayed gastric emptying.   PHYSICAL EXAMINATION:  VITAL SIGNS:  On exam, pulse 76, blood pressure  140/78, weight 215.  RECTAL:  I believe there is a fissure located approximately 1 o'clock  although exam was limited due to pain.   IMPRESSION:  1. Postprandial nausea.  He may have mild gastroparesis.  2. Probable symptomatic anal fissure.   RECOMMENDATIONS:  1. A trial of Reglan 10 mg one-half hour a.c. and h.s.  2. Anoscopy while sedated and sigmoidoscopy.  If it is determined that      he has a fissure, then I will inject it with Botox.  If problems      seem to be hemorrhoids, then I will be prepared to do band      ligation.     Barbette Hair. Arlyce Dice, MD,FACG  Electronically Signed    RDK/MedQ  DD: 04/30/2007  DT: 05/01/2007  Job #: [id number]

## 2011-03-22 NOTE — Assessment & Plan Note (Signed)
[facility]                         GASTROENTEROLOGY OFFICE NOTE   [name], [name]                         MRN:          [id number]  DATE:02/26/2008                            DOB:          1931/08/23    PROBLEM:  History of colon polyps.   HISTORY:  [name] has returned to set up a colonoscopy.  He has a  history of colon polyps and was last examined five years ago.  He was  seen by Dr. Molli Hazard B. Daphine Deutscher, who has scheduled a repair of his  paraesophageal hiatal hernia.  It is noteworthy that he is on Coumadin  for his atrial fibrillation.   PHYSICAL EXAMINATION:  VITAL SIGNS:  Pulse 76, blood pressure 126/62,  weight 208 pounds.   IMPRESSION:  1. History of colon polyps.  2. Intermittent gastric volvulus possible.  3. Paraesophageal hiatal hernia.  4. Atrial fibrillation, on Coumadin.   RECOMMENDATIONS:  Followup colonoscopy.  [name] wishes to do this  before his surgery.  I will schedule him accordingly.     Barbette Hair. Arlyce Dice, MD,FACG  Electronically Signed    RDK/MedQ  DD: 02/26/2008  DT: 02/26/2008  Job #: [id number]

## 2011-03-22 NOTE — Op Note (Signed)
NAME:  Ricky [name], Ricky [name] NO.:  [id number]   MEDICAL RECORD NO.:  [id number]          PATIENT TYPE:  INP   LOCATION:  0003                         FACILITY:  [facility]   PHYSICIAN:  Thornton Park. Daphine Deutscher, MD  DATE OF BIRTH:  1931-04-22   DATE OF PROCEDURE:  06/26/2008  DATE OF DISCHARGE:                               OPERATIVE REPORT   PREOPERATIVE INDICATIONS:  Large type 3 mixed hiatal hernia, with  stomach in chest and evidence of organoaxial volvulus on upper  gastrointestinal.   POSTOPERATIVE DIAGNOSIS:  Large type 3 mixed hiatal hernia, with stomach  in chest and evidence of organoaxial volvulus on upper gastrointestinal.   PROCEDURE:  Takedown of incarcerated stomach from chest, repair of  diaphragm with pledgeted suture closure, and gastropexy.   SURGEON:  Thornton Park. Daphine Deutscher, M.D.   ASSISTANT:  Alfonse Ras, M.D.   ANESTHESIA:  General endotracheal.   DESCRIPTION OF PROCEDURE:  Mr. Decelles was taken to room #1 on Thursday  June 26, 2008 and given general anesthesia.  The abdomen was prepped  with Techni-Care and draped sterilely.  I entered the abdomen through  the left upper quadrant using OptiVu 0-degree 11/12 trocar.  Once  insufflation was achieved, I placed standard trocars, including an upper  midline 5 mm to retract the liver.  The stomach was almost entirely in  the chest.  We grasped it.  I was fortunate to find the sac interface  and began teasing the sac out of the chest.  I did this almost in its  entirety.  It took a while, and then eventually I had cleared out both  the right and left crus, and then posteriorly I went under to see the  left crus and then was able to get a Penrose drain around this.  Holding  this in place, I then completed the dissection of the sac from the chest  and reduction of the esophagus into the abdomen.  We went ahead, and Dr.  Colin Benton passed the endoscope, and we verified the anatomy including the  esophagus and the  abdomen, and no evidence of perforation or  intraesophageal injury.  I felt that with the complexity of his hernia  and the absence of reflux symptoms preoperatively, I elected to go ahead  and repair this hiatus posteriorly with 3 pledgeted sutures using the  Endo stitch, and once these were tied it was nice and snug, and then  anteriorly I tagged the periesophageal tissue to the anterior crus on  the left.  The stomach was then pexed down along the anterolateral  margin of the greater curvature to the anterior abdominal wall, slightly  to the left of midline, with the sutures being placed on the stomach and  then pulled up with a suture passer and tied below the skin.  Tisseel  was also applied to  these areas as I let out gas in the abdomen.  Everything appeared to be  in order.  The wounds were then injected with Marcaine and closed with 4-  0 Vicryl, Benzoin, and Steri-Strips.  The  patient seemed to tolerate the  procedure well and was taken to the recovery room in satisfactory  condition.      Thornton Park Daphine Deutscher, MD  Electronically Signed     MBM/MEDQ  D:  06/26/2008  T:  06/26/2008  Job:  [id number]   cc:   [name], MD  [address]  [place]  [place] [address]   [name]. [name], MD, [facility]  [address]  [address]  [place]  [place] [address]   Barbette Hair. Arlyce Dice, MD,FACG  [address]  [place]  [place] [address]

## 2011-03-22 NOTE — Procedures (Signed)
CAROTID DUPLEX EXAM   INDICATION:  Follow up right carotid endarterectomy.   HISTORY:  Diabetes:  No.  Cardiac:  CAD.  Hypertension:  Yes.  Smoking:  No.  Previous Surgery:  Right carotid endarterectomy in 2004.  CV History:  No.  Amaurosis Fugax No, Paresthesias No, Hemiparesis No                                       RIGHT             LEFT  Brachial systolic pressure:         146               136  Brachial Doppler waveforms:         Normal            Normal  Vertebral direction of flow:        Antegrade         Antegrade  DUPLEX VELOCITIES (cm/sec)  CCA peak systolic                   115               127  ECA peak systolic                   118               126  ICA peak systolic                   69                245  ICA end diastolic                   17                57  PLAQUE MORPHOLOGY:                  None              Mixed  PLAQUE AMOUNT:                      Non               Moderate  PLAQUE LOCATION:                    None              ICA/ECA/CCA   IMPRESSION:  1. Patent right carotid endarterectomy site with no evidence of      stenosis.  2. Stenosis of the left internal carotid artery 60-79%.  3. No significant change noted from the previous exam on 12/04/2007.   ___________________________________________  Di Kindle. Edilia Bo, M.D.   CH/MEDQ  D:  05/20/2008  T:  05/20/2008  Job:  [id number]

## 2011-03-22 NOTE — H&P (Signed)
NAME:  NOAL, ABSHIER NO.:  [id number]   MEDICAL RECORD NO.:  [id number]          PATIENT TYPE:  EMS   LOCATION:  MAJO                         FACILITY:  [facility]   PHYSICIAN:  Iva Boop, MD,FACGDATE OF BIRTH:  1931-09-28   DATE OF ADMISSION:  02/08/2008  DATE OF DISCHARGE:                              HISTORY & PHYSICAL   CHIEF COMPLAINT:  Vomiting.   HISTORY:  A 77 year old white man with a history of gastroesophageal  reflux disease and esophageal stricture.  He is followed by Dr. Melvia Heaps.  His last dilation was in June 2008.  He was in his usual state  of health over the past 3-4 months.  He has had some intermittent  episodes of regurgitation and vomiting.  Tuesday of this week which is  now 3 days ago, he had vomiting of dark gastric contents.  Apparently,  he called our office and was advised to come, but decided not to.  The  symptoms remitted until today when he had recurrent problems.  Both  times it occurred after eating meals.  He is not having dysphagia or  anything to suggest a food impaction.  He is not really having pain.  He  is vomiting up dark coffee ground-like material.  He has regurgitated it  more so in front of me during the exam.  It sounds like he may have had  problems with vomiting off and on before.  Particularly in the last 3-4  months, he has had some spells, but he denies any dark gastric contents  like now.  There is really no blood.  He moved his bowels today, though  he does suffer from some constipation.  Regarding previous vomiting  spells, I see from the computer that in April 2007 he had borderline  mild delayed gastric emptying on a gastric emptying study ordered by Dr.  Arlyce Dice with indications of dysphagia, bloating and vomiting.  He does  not report any melena or hematochezia.  He has been given Zofran and  Reglan in the emergency department with some benefit, though certainly  not complete at this point.   In  the emergency room because of concern about GI bleeding, he has been  given vitamin K as well as FFP.  His INR is actually 1.5.  That was  prior to intervention.   MEDICATIONS:  1. Citalopram.  2. Hydrobromide 20 mg daily.  3. Alprazolam 0.5 mg daily.  4. Proctocream HC 2.5% p.r.n.  5. Prilosec OTC 1 daily.  6. Previously on Nexium, this change is made to save dollars.  7. Multivitamins.  8. Mucinex.  9. Lisinopril 10 mg daily.  10.Simvastatin 80 mg daily.  11.Warfarin sodium 5 mg daily.   DRUG ALLERGIES:  PENICILLIN.   PAST MEDICAL HISTORY:  1. Obstructive sleep apnea.  2. History of atrial fibrillation/flutter status post permanent      pacemaker followed by Dr. [name] and Dr. Graciela Husbands.  3. Dyslipidemia.  4. Hiatal hernia.  5. Esophageal stricture.  6. Colonic diverticulosis.  7. Diminutive colonic polyps 2004.  I do  not have the pathology.  8. Degenerative joint disease.  9. Anxiety.  10.History of anemia.  11.Cerebrovascular disease with carotid artery stenosis status post      right carotid endarterectomy.  He has high-grade stenosis at the      endarterectomy site at this time.  12.Anal fissure status post Botox injection July 2008.   PAST SURGICAL HISTORY:  Also notable for coronary bypass grafting 2000.   FAMILY HISTORY:  No coronary artery disease.  No family history of colon  cancer reported.  His mother died due to diabetes and complications  there of.  Father had transient ischemic attacks.   SOCIAL HISTORY:  He lives in [place].  He is retired.  He used  to work for [employer].  He is a former smoker.  He does not  use alcohol or drugs.   REVIEW OF SYSTEMS:  He has a new lesion on the left lower extremity.  He  has had some rhinorrhea problems off and on for several weeks as well as  sneezing, question allergies.  He denies NSAID use or meds other than  what are described as best I can tell.  He does have joint pains at  times.  All  other systems appear negative at this time.   PHYSICAL EXAMINATION:  GENERAL:  Reveals a chronically ill elderly white  man.  He is not complaining of any pain at this time.  VITAL SIGNS:  Temperature 97.9, blood pressure 168/83, pulse 78.  HEENT:  The eyes are anicteric.  Pupils are round and reactive to light.  Mouth free of lesions.  NECK:  Supple, carotid endarterectomy scar noted.  I hear no bruits.  LUNGS:  Clear.  BACK:  No CVA tenderness.  HEART:  S1-S2, paced.  No rubs, murmurs or gallops are heard.  Appears  to be regular.  ABDOMEN:  Soft.  Bowel sounds are diminished.  It is nontender.  There  is no succussion splash.  There are no bruits.  There is no  organomegaly.  No cervical, supraclavicular, inguinal adenopathy  detected.  LOWER EXTREMITIES:  Free of edema.  There is a small dime-sized, raised,  red lesion on the left lower extremity that is firm and somewhat scaly.  It is a few centimeters above the lateral malleolus.  NEUROLOGIC:  Cranial nerves II-XII appear intact.  He is alert and  oriented x3.   LABORATORY DATA:  White blood cell count of 11.1, hemoglobin 15.5,  hematocrit 46, platelets 218, lipase is normal.  His CMET is normal  except for a glucose of 167.  His pro-time INR is 1.5.   ASSESSMENT:  A 77 year old white man with known history of  gastroesophageal reflux disease and esophageal stricture.  He is having  recurrent vomiting and regurgitation.  Etiology of this is not entirely  clear to me at this time.  He does have dark coffee ground-like material  and it has been tested for hemoccult or Gastroccult perhaps, but I do  not think he is having a GI bleed based upon all of the available data.  Etiology of this is not really clear at this time.  It does not seem  like a viral syndrome.  It does not seem like a food impaction or any  problems such as that.  Though there was a history of hiatal hernia,  this is not reported on his last EGD of June 2008  in which he underwent  Savary dilation.  He  has had upper endoscopy and dilation in September  2006, October 2006, May 2007 and June 2008.  He is not really having  much in the way of dysphagia at this time if at all.  He has received vitamin K and FFP though before it was found that his  INR was normal.   PLAN:  1. Admit the patient to the hospital.  2. IV hydration.  3. Antiemetics.  He has been tried on Zofran and Reglan.  I will try      to add some IV Ativan since he takes Xanax.  Perhaps there could be      some benzodiazepine withdrawal affect given his vomiting.      Sometimes Ativan can help with nausea and vomiting as well.  4. Likely plan for upper GI endoscopy tomorrow pending review of acute      abdominal series.  We will follow up his labs in the morning.      Iva Boop, MD,FACG  Electronically Signed     CEG/MEDQ  D:  02/08/2008  T:  02/09/2008  Job:  [id number]   cc:   Barbette Hair. Arlyce Dice, MD,FACG  [name], MD

## 2011-03-22 NOTE — Assessment & Plan Note (Signed)
[facility]                         GASTROENTEROLOGY OFFICE NOTE   Ricky [name], Ricky [name]                         MRN:          [id number]  DATE:04/09/2007                            DOB:          08/10/1931    PROBLEM LIST:  1. Dysphagia.  2. Rectal bleeding.   REASON FOR RETURN:  Ricky [name] has returned for reevaluation.  He has  dysphagia to solids.  This causes chest discomfort which is relieved by  vomiting.  He has history of an esophageal stricture that was last  dilated 13 months ago.  He remains on omeprazole 20 mg a day.  He is  also complaining of rectal bleeding consisting of bright red blood on  the toilet tissue.  He has rectal soreness with defecation.  He was last  colonoscoped in April 2004 where diverticulosis and adenomatous polyps  were removed.   MEDICATIONS:  1. Altace.  2. Nexium.  3. Lipitor.  4. Citalopram .  5. Alprazolam.  6. Mucinex.  7. Omeprazole.  8. MiraLax.   PHYSICAL EXAMINATION:  VITAL SIGNS:  Pulse 84, blood pressure 136/70,  weight 216.  RECTAL:  He has a reddened, slightly excoriated rash in the perianal  area.  There are no external rectal masses or hemorrhoids.   IMPRESSION:  1. Recurrent esophageal stricture.  2. Probable symptomatic hemorrhoids.  3. Perirectal rash.   RECOMMENDATIONS:  1. Repeated endoscopy with Savory dilatation.  2. Continue Nexium.  Omeprazole can be discontinued.  3. Anusol HC suppositories.  4. Lotrimin 1% cream to the rectal area.     Barbette Hair. Arlyce Dice, MD,FACG  Electronically Signed   RDK/MedQ  DD: 04/09/2007  DT: 04/10/2007  Job #: [id number]

## 2011-03-22 NOTE — Procedures (Signed)
CAROTID DUPLEX EXAM   INDICATION:  Carotid disease.   HISTORY:  Diabetes:  No.  Cardiac:  CAD.  Hypertension:  Yes.  Smoking:  No.  Previous Surgery:  Right carotid endarterectomy in 2004.  CV History:  Currently asymptomatic.  Amaurosis Fugax No, Paresthesias No, Hemiparesis No                                       RIGHT             LEFT  Brachial systolic pressure:         146               142  Brachial Doppler waveforms:         Normal            Normal  Vertebral direction of flow:        Antegrade         Antegrade /  resistive  DUPLEX VELOCITIES (cm/sec)  CCA peak systolic                   122               105  ECA peak systolic                   142               84  ICA peak systolic                   81                263  ICA end diastolic                   21                60  PLAQUE MORPHOLOGY:                                    Mixed  PLAQUE AMOUNT:                      None              Moderate  PLAQUE LOCATION:                                      ICA / ECA / CCA   IMPRESSION:  1. Patent right carotid endarterectomy site with no right internal      carotid artery stenosis.  2. 60%-79% stenosis of the left internal carotid artery.  3. No significant change noted when compared to the previous exam on      05/26/2009.   ___________________________________________  Quita Skye. Hart [place], M.D.   CH/MEDQ  D:  12/03/2009  T:  12/03/2009  Job:  [id number]

## 2011-03-22 NOTE — Discharge Summary (Signed)
NAME:  MADOX, CORKINS NO.:  [id number]   MEDICAL RECORD NO.:  [id number]          PATIENT TYPE:  INP   LOCATION:  1511                         FACILITY:  [facility]   PHYSICIAN:  Thornton Park. Daphine Deutscher, MD  DATE OF BIRTH:  [birth year]   DATE OF ADMISSION:  06/26/2008  DATE OF DISCHARGE:  06/29/2008                               DISCHARGE SUMMARY   ADMITTING DIAGNOSIS:  Large hiatal hernia, persistent vomiting with  hematemesis.   PROCEDURE:  Laparoscopic takedown of incarcerated stomach with repair of  hiatus and gastropexy.   COURSE IN THE HOSPITAL:  Mr. Altair Appenzeller is a 77 year old man with type 3  mixed hiatal hernia and mentioned above.  This procedure was performed.  He did well but did have some problems with urinary retention.  An upper  GI showed some swelling of his distal esophagus but no leak.  He was  doing better and voiding and subsequently was ready for discharge home  on postop day #3 to began with the [facility] the following day and  a follow with Dr. Daphine Deutscher in 7 to 10 days.  He is given Roxicet elixir to  take for pain.      Thornton Park Daphine Deutscher, MD  Electronically Signed     MBM/MEDQ  D:  07/30/2008  T:  07/30/2008  Job:  [id number]   cc:   Barbette Hair. Arlyce Dice, MD,FACG  [address]  [place]  [place] [address]   [name], MD  [address]  [place]  [place] [address]   [name]. [name], MD, [facility]  [address]  [address]  [place]  [place] [address]

## 2011-03-22 NOTE — Assessment & Plan Note (Signed)
[facility]                         GASTROENTEROLOGY OFFICE NOTE   Ricky [name], Ricky [name]                         MRN:          [id number]  DATE:06/05/2007                            DOB:          1931-09-10    PROBLEM:  Anal fissure roots.   Ricky [name] has returned for ongoing evaluation.  He underwent Botox  injection of his anal fissure on May 09, 2007.  He reports improvement  in his symptoms.  He has occasional rectal stinging and minimal  bleeding.  Dysphagia and vomiting are no longer problems.  Altogether he  is feeling fairly well.  He complained of a possible left inguinal  hernia.   PHYSICAL EXAMINATION:  Pulse 60, blood pressure 132/80, weight 214.  ABDOMEN:  There are no frank hernias.   IMPRESSION:  Anal fissure - minimally symptomatic following Botox  injection.   RECOMMENDATIONS:  Continue warm soaks and stool softeners.     Barbette Hair. Arlyce Dice, MD,FACG  Electronically Signed    RDK/MedQ  DD: 06/05/2007  DT: 06/05/2007  Job #: [id number]

## 2011-03-22 NOTE — Letter (Signed)
December 21, 2007    [name], MD  [address]  [place], [place] [address]   RE:  Ricky, [name]  MRN:  [id number]  /  DOB:  [birth year]   Dear Lorin Picket,   I had the pleasure of seeing your nice patient,  Ricky [name] in the  office today in a followup  visit.  Cardiac-wise he seems to be getting  along reasonably well.  Overall, he has not been having any chest pain.  He has had recent followup  studies at the [facility] for his carotid disease.  As you know, he has had a carotid endarterectomy, and has now high grade  stenosis at the carotid endarterectomy site.  He does have a left ICA 60-  79% stenosis read by Woodfin Ganja.  His vertebral flow was antegrade  bilaterally.  He has had a pacer replacement 3 years ago.  He has  chronic edema in the right lower extremities.  I have seen his recent  laboratory studies and from what I can gather looking at a computerized  medical record you have increased his simvastatin recently from 40 mg to  80 mg.  His wife is not quite certain of his medications.   Today on examination he is alert and oriented, in no distress.  He has  had absolutely no chest pain whatsoever.  The lung fields are clear to  auscultation and percussion.  The cardiac rhythm is regular.  His pacer  site looks good.  The patient's extremities reveal about 2+ edema of the  right lower extremity and 1+ in the left.  His recent EKG in December  did show AV pacing with a magnet.   To summarize, he is stable, he will continue his current medical  regimen, aggressive lipid management managed by your office is  warranted.  Follow up with vascular surgery is also warranted given his  carotid disease.  Finally, he will have pacer followup  in the EP clinic  in July.  He is followed by telephone.  We have ordered a Doppler of the right  lower extremity because of his chronic edema, but this is likely related  to his harvest site.  Should he develop any problems in the interim,  please do not hesitate to let me know.  I appreciate the opportunity of  sharing his care.    Sincerely,      [name]. [name], MD, [facility]  Electronically Signed    TDS/MedQ  DD: 12/21/2007  DT: 12/22/2007  Job #: [id number]

## 2011-03-22 NOTE — Discharge Summary (Signed)
NAME:  Ricky [name], Ricky [name] NO.:  [id number]   MEDICAL RECORD NO.:  [id number]          PATIENT TYPE:  INP   LOCATION:  5151                         FACILITY:  [facility]   PHYSICIAN:  Iva Boop, MD,FACGDATE OF BIRTH:  02/07/1931   DATE OF ADMISSION:  02/08/2008  DATE OF DISCHARGE:  02/09/2008                               DISCHARGE SUMMARY   DISCHARGE DIAGNOSES:  1. Persistent recurrent vomiting caused by hiatal hernia and presumed      partial volvulus of the stomach.  2. Obstructive sleep apnea.  3. Dyslipidemia.  4. Anxiety.  5. History of anemia, sounds like iron deficiency, followed by Dr.      Cheree Ditto for treatment with intermittent iron infusions.  6. Cerebrovascular disease with carotid artery stenosis, status post      right carotid endarterectomy and a high-grade stenosis at the      endarterectomy site.  7. History of anal fissure.  8. History of atrial fibrillation/flutter, status post permanent      pacemaker followed by Dr. [name] and Dr. Graciela Husbands.  9. Dyslipidemia.  10.History of colonic polyps, I do not have the pathology.  11.History of a coronary artery bypass grafting.   See my history and physical of February 08, 2008, for history of the  patient.  He had been having recurrent vomiting and regurgitation of  dark material.  It was thought to be a GI bleed in the emergency room.  He was actually given vitamin K and FFP before it was found that his INR  was 1.5 at the time of presentation.   HOSPITAL COURSE:  The patient was admitted, he felt better by morning.  An endoscopy was performed that showed most of his stomach and the chest  consistent with a large hiatus hernia, as was also seen on chest x-ray.  There was no evidence for ischemia or ulceration.  Because of the  worsening history, I had general surgery to see him and Dr. Ovidio Kin  evaluated him and thought that the patient did not need to stay, but  needed a workup that included an  upper GI series, CT of the chest and  abdomen, and a cardiac evaluation prior to any surgery.  It turns out,  the patient saw Dr. Luretha Murphy years ago after his coronary artery  bypass grafting, at the suggestion of Dr. Tyrone Sage.   PROCEDURES:  Esophagogastroduodenoscopy.   CONSULTATIONS:  General surgery,  Dr. Ezzard Standing.   DISCHARGE MEDICATIONS:  1. Prilosec 20 mg daily.  2. Simvastatin 80 mg daily.  3. Citalopram 20 mg daily.  4. Xanax 0.25 mg daily.  5. Lisinopril 10 mg daily.  6. Mucinex daily.  7. Multivitamin daily.  8. Warfarin sodium 5 mg as directed.  He will need to contact the      Coumadin Clinic for further advice on that.   He will be discharged on warfarin 10 mg nightly for 2  nights and he is  to contact the [facility]madin Clinic for further directions.  After that I  will have him switch to 5 mg daily  in case he cannot reach them.  He  should have an INR checked within a week.   FOLLOW UP:  He actually has appointments with Dr. Cephas Darby and  Dr. Melvia Heaps scheduled for February 11, 2008, and he should keep those.  At the time of Dr. Marzetta Board appointment, we can then schedule an upper  GI series and CT of the chest and abdomen per Dr. Allene Pyo  recommendations.  He should also have a consultation with Dr. [name]  regarding cardiac clearance and then it would be reasonable for him to  see Dr. Daphine Deutscher in followup per Dr. Ezzard Standing.  He is also found to have a  left inguinal hernia and he has a cyst on his left calf that Dr. Ezzard Standing  did not feel was significant.   It is clear that the patient has high risk for any surgery, though he  certainly may have recurrent symptoms like this.  He has not had a full  volvulus or torsion to lead to a ischemia or anything life-threatening.  This is an overall difficult problem to solve.   ACTIVITY:  As tolerated at discharge.      Iva Boop, MD,FACG  Electronically Signed     CEG/MEDQ  D:  02/09/2008  T:   02/10/2008  Job:  [id number]   cc:   Barbette Hair. Arlyce Dice, MD,FACG  [name], MD  Thornton Park Daphine Deutscher, MD  [name]. [name], MD, [facility]  Di Kindle. Edilia Bo, M.D.

## 2011-03-25 NOTE — Op Note (Signed)
NAME:  Ricky [name], Ricky [name] NO.:  [id number]   MEDICAL RECORD NO.:  [id number]          PATIENT TYPE:  OIB   LOCATION:  6527                         FACILITY:  [facility]   PHYSICIAN:  Duke Salvia, M.D.  DATE OF BIRTH:  25-Mar-1931   DATE OF PROCEDURE:  10/19/2004  DATE OF DISCHARGE:                                 OPERATIVE REPORT   PREOPERATIVE DIAGNOSIS:  Atrial fibrillation, paroxysmal with a prior  history of AV junction ablation, prior pacemaker implantation now at end of  life.   POSTOPERATIVE DIAGNOSIS:  Atrial fibrillation, paroxysmal with a prior  history of AV junction ablation, prior pacemaker implantation now at end of  life.   PROCEDURE:  Dual chamber pacemaker replacement.   Following obtaining informed consent, the patient was brought to the  electrophysiology laboratory and placed on the fluoroscopic table in supine  position.  After routine prep and drape of the left upper chest, lidocaine  was infiltrated in the prepectoral subclavicular region.  An incision was  made and carried down to the level of the prepectoral fascia and the  pacemaker pocket using sharp dissection.  The pocket was opened.  With great  care, the leads were freed up.  The device was explanted.  Interrogation of  the previously implanted atrial 1342TE pacemaker lead, serial number  [id number], demonstrated a P wave of 2.3 with impedance of 519, threshold 1  volt at 0.5, and the previously implanted ventricular [employer] lead model  1346T 58 cm length, serial number [id number], demonstrated no intrinsic  ventricular rhythm and impedance of 583 with threshold of less than or equal  to 1 volt at 0.5 milliseconds.  The leads were then attached to a [employer]. Jude  Identity ADX XL pulse generator, model [id number], serial number [id number].  The  pocket was copiously irrigated with antibiotic containing saline solution.  Hemostasis was assured.  The leads and pulse generator were then placed in  the  pocket, the wound was closed in three layers in a normal fashion.  The  wound was washed, dried, and a Benzoin and Steri-Strip dressing was applied.  Needle counts, sponge counts, and instrument counts were correct at the end  of the procedure according to the staff.  The patient tolerated the  procedure without apparent complications.       SCK/MEDQ  D:  10/19/2004  T:  10/19/2004  Job:  [id number]

## 2011-03-25 NOTE — Assessment & Plan Note (Signed)
[facility]                             PULMONARY OFFICE NOTE   Ricky [name], Ricky [name]                         MRN:          [id number]  DATE:10/05/2006                            DOB:          [birth year]    HISTORY OF PRESENT ILLNESS:  The patient is a 77 year old white male  patient of Dr. Jodelle Green who has a known history of bronchitis,  arteriosclerotic heart disease and hyperlipidemia who presents for acute  office visit complaining of a one day history of nasal congestion and  cough.  The patient denies any problems with sputum, fever, chest pain,  orthopnea, PND or leg swelling.  Past medical history is reviewed.  Current medications are reviewed.   PHYSICAL EXAMINATION:  GENERAL:  The patient is a pleasant male in no  acute distress.  VITAL SIGNS:  Stable.  HEENT:  Nasal mucosa with some mild redness, nontender sinuses,  posterior pharynx is clear.  NECK:  Supple without adenopathy.  LUNGS:  Sounds are clear.  CARDIAC:  Regular rate.  ABDOMEN:  Soft, benign.  EXTREMITIES:  Warm without any edema.   IMPRESSION/PLAN:  Upper respiratory infection suspected viral in nature.  Patient to use Mucinex DM b.i.d.  Endal HD #8 ounces 1-2 teaspoons q.4-6  h p.r.n., #8 ounces without refills given.  The patient is aware of  sedating effect.  The patient was given a prescription for Omnicef x5  days to have on hold in case symptoms worsen over the next 5-7 days.  Otherwise, the patient can discard this prescription, suspect that this  is mostly viral in nature.      Rubye Oaks, NP  Electronically Signed      [name], MD  Electronically Signed   TP/MedQ  DD: 10/05/2006  DT: 10/06/2006  Job #: [id number]

## 2011-03-25 NOTE — Assessment & Plan Note (Signed)
[facility]                            CARDIOLOGY OFFICE NOTE   Ricky, [name]                         MRN:          [id number]  DATE:02/16/2007                            DOB:          05/16/1931    Ricky [name] is in for a followup.  He has been seen by Dr. Kriste Basque and does  have sleep apnea.  The patient has complete heart block and is status  post ablation.  He had a pacemaker placed.  He underwent  echocardiography in August of 2007, at that time showed flattening of  the intraventricular septum with dyssynergic motion but only mildly  decreased overall LV function.  There was mild calcification of the  aortic valve with low normal deformity.  There was 1+ mitral  regurgitation with some deformity of the mitral apparatus.  There was a  mild degree of tricuspid regurgitation.  The patient does not have chest  pain.   MEDICATIONS:  1. Altace 5 mg daily.  2. Nexium 40 mg daily.  3. Lipitor 40 mg daily.  4. Citalopram 20 mg daily.  5. Multivitamin daily.  6. RELY study drug.  7. Tylenol Arthritis.  8. Alprazolam 0.5 p.r.n.  9. Mucinex b.i.d.  10.Omeprazole 20 mg daily.  11.Polyethylene Glycol.  12.He is taking a trial medication.   EXAMINATION:  The blood pressure is 122/70, pulse 75.  LUNG FIELDS:  Clear.  CARDIAC RHYTHM:  Regular.  There is a soft systolic ejection murmur.  There is trace lower extremity edema.   The EKG reveals some intraventricular pacing.   IMPRESSION:  1. Coronary artery disease status post coronary artery bypass graft      surgery.  2. History of status post flutter ablation.  3. Hypercholesterolemia, on lipid-lowering therapy, with last LDL      cholesterol 95 done by Dr. Kriste Basque.  4. Sleep apnea, managed by Dr. Kriste Basque.   PLAN:  Return to clinic in 6 months.     [name]. [name], MD, [facility]  Electronically Signed    TDS/MedQ  DD: 03/01/2007  DT: 03/01/2007  Job #: [id number]

## 2011-03-25 NOTE — H&P (Signed)
NAME:  COBURN, KNAUS NO.:  [id number]   MEDICAL RECORD NO.:  [id number]          PATIENT TYPE:  OIB   LOCATION:  2899                         FACILITY:  [facility]   PHYSICIAN:  Duke Salvia, M.D.  DATE OF BIRTH:  1930/12/23   DATE OF ADMISSION:  10/19/2004  DATE OF DISCHARGE:                                HISTORY & PHYSICAL   PRIMARY CAREGIVER:  [name], M.D. [facility].   CARDIOLOGIST:  [name]. [name], M.D. [facility]   ELECTROPHYSIOLOGIST:  Duke Salvia, M.D.   ALLERGIES:  No known drug allergies.   PRESENTING CIRCUMSTANCE:  I am here to have my battery changed.   HISTORY OF PRESENT ILLNESS:  [name] is a 77 year old male who  presents with a pacemaker which is a Trilogy pacemaker by [employer]. [employer] which is  at the elective replacement indicator.  He is here for a generator change.  He has a history of severe three-vessel coronary artery disease, and he is  status post coronary artery bypass graft surgery in September 2000.  He also  has a history of hypertension, dyslipidemia, extra-cranial cerebrovascular  occlusive disease, and his latest surgery is a right carotid endarterectomy,  done October 2004.  His pacemaker was implanted in the setting of paroxysmal  atrial fibrillation.  The patient, at that time, was status post A-V node  ablation with implantation of pacemaker.  The patient is on chronic  Coumadin.  The pacemaker implantation is a Trilogy and done in 1998.  The  patient said it was done because his heart went fast and it went slow.  According to the office note, December 2004, he has normal left ventricular  function.  It is not known whether he has had a post coronary artery bypass  graft surgery catheterization and there is no record of a recent  echocardiogram.  The patient is currently not having chest pain, dyspnea on  exertion or at rest.  He says he had syncope but nothing since the pacemaker  was placed.  No presyncope, no  dizziness, no amaurosis, no unilateral  weakness.  Per the patient and the wife, there has been no recent stress  study or echocardiogram.   CARDIAC RISK FACTORS:  1.  Hypertension.  2.  Dyslipidemia.   CARDIAC RISK EQUIVALENTS:  1.  Extra-cranial cerebrovascular disease.  2.  Known coronary artery disease.   Because a recent echocardiogram is not available, structural heart disease  is not known.  Also the original cardiac catheterization is not available.   MEDICATIONS:  1.  Coumadin, per Coumadin clinic.  2.  Lipitor 20 mg at bedtime daily.  3.  Altace 5 mg daily.  4.  Celexa 20 mg daily.  5.  Ranitidine 150 mg twice daily.  6.  Celebrex 200 mg daily.   SOCIAL HISTORY:  The patient lives in [place] about 6 miles [place] with his wife.  He is retired.  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  He does not smoke tobacco products, having quit in 1998.  He does  not use alcoholic beverages or recreational drugs.   FAMILY HISTORY:  His mother died secondary to complications of diabetes.  She was a double amputee.  Father is deceased and has a history of transient  ischemic attacks.  He has one sister living.  She has blood.  No other  brothers.   REVIEW OF SYSTEMS:  The patient denies fevers, chills, night sweats.  He  does say that he has been gaining weight but this is probably secondary to  dietary indiscretion.  No adenopathy.  HEENT:  No nasal discharge.  No  epistaxis.  No voice change.  No vertigo.  INTEGUMENT:  No rashes or non-  healing ulcerations or lesions.  CARDIOPULMONARY:  The patient is not having  chest pain either at rest or exertion.  No shortness of breath.  No dyspnea  on exertion.  No orthopnea.  No paroxysmal nocturnal dyspnea.  He does have  edema but it effects the right leg only.  This is a leg which is status post  saphenous vein graft harvesting.  The patient has a history of syncope but  this was before the pacer was placed.   No claudication.  No palpitations.  UROGENITAL:  No frequency, dysuria, urgency at stream.  NEURO/PSYCHIATRIC:  No weakness, depression, or anxiety.  GI:  No history of melena or internal  bleeding.  No peptic ulcer disease.  He does have a hiatal hernia with GERD.  ENDOCRINE:  Thyroid status unknown.  The patient does not give a history of  heat or cold intolerance.  MUSCULOSKELETAL:  The patient takes Celebrex for  scattered polyarthropathy.  All other systems negative.   PHYSICAL EXAMINATION:  GENERAL:  This is an alert oriented male in no acute  distress.  Quite jocular on examination.  VITAL SIGNS:  Temperature is 97.8, pulse is 78 and regular, blood pressure  188/88, respirations are 20, oxygen saturation 97% on room air.  HEENT:  Atraumatic, normocephalic.  Eyes, pupils are equal, round and  reactive to light.  Extraocular movements are intact.  Sclerae are clear.  Nares without discharge.  NECK:  Supple.  There is a well healed cicatrix on the right side.  There  are bilateral carotid bruits which are soft.  No jugular venous distention.  No cervical lymphadenopathy.  HEART:  Regular rate and rhythm.  S1 S2 auscultated.  No murmur.  LUNGS:  Clear to auscultation and percussion bilaterally.  There is a well  healed midline sternotomy incision.  ABDOMEN:  Soft, nondistended.  Bowel sounds are present.  Abdominal aorta is  non-pulsatile, non-expansile.  No rebound or guarding.  EXTREMITIES:  Show no evidence of clubbing, cyanosis.  There is mild edema  to the right lower extremity maybe 1+.  Radial pulses 4/4 bilaterally,  dorsalis pedis 2/4 on the right , 4/4 on the left.  There is a well healed  saphenous vein graft harvest along the medial portion of the right lower  extremity which is well healed.  MUSCULOSKELETAL:  Shows no joint deformity, effusions, or kyphosis.  NEUROLOGIC:  Cranial nerves II-XII are grossly intact.  No neurologic  deficits.  All laboratories are pending.   They were taken today and it is a B-MET, CBC,  PT INR.   IMPRESSION:  1.  Admit with Trilogy permanent pacemaker, add elective replacement      indicator.  2.  Severe three-vessel coronary artery disease, status post coronary artery      bypass graft surgery  in September 2000.  3.  History of paroxysmal atrial fibrillation on chronic Coumadin.  4.  Status post A-V node ablation with a Trilogy pacemaker implanted in      1998.  5.  Extra-cranial cerebrovascular occlusive disease, status post right      carotid endarterectomy, October 2004.  The left internal carotid artery      has a 40-60% stenosis.  6.  Hypertension.  7.  Dyslipidemia.  8.  Gastroesophageal reflux disease/hiatal hernia.  9.  The wife says the patient has also a chronic left sided inguinal hernia.      The patient is status post bilateral herniorrhaphies about 20 years ago.   The patient presents for a generator change out today, Dr. Sherryl Manges.  I  would also recommend the patient have some imaging studies while he is here  and maybe be scheduled for outpatient Cardiolite.  Certainly a 2D  echocardiogram is not over loading the system for this gentleman.       GM/MEDQ  D:  10/19/2004  T:  10/19/2004  Job:  [id number]

## 2011-03-25 NOTE — Assessment & Plan Note (Signed)
[facility]                           ELECTROPHYSIOLOGY OFFICE NOTE   HOLBERT, CAPLES                         MRN:          [id number]  DATE:06/07/2006                            DOB:          June 20, 1931    Ricky [name] is seen.  He has a history of paroxysmal atrial arrhythmias,  sinus node dysfunction with prior pacemaker implantation.  He is status post  AV ablation.  He is doing quite well.  He does have some problems getting  around but other than that he has no significant complaints.   Medications are reviewed and are unchanged.   PHYSICAL EXAMINATION:  VITAL SIGNS:  His blood pressure is 140/78, pulse 76.  LUNGS:  Clear.  HEART:  Heart sounds were regular.  EXTREMITIES:  Without edema.   Interrogation with [employer]. Jude Identity Pulse generator sensed a P wave of 2.5  with impedence of 49 and threshold of 0.75 to 0.4.  There was no intrinsic  ventricular arrhythmia, impedence of 436 and threshold of 0.5 to 0.4.  No  evidence of significant ventricular high-rate episodes was identified.  Atrial fibrillation occurred periodically.   IMPRESSION:  1.  Complete heart block status post AV ablation.  2.  Paroxysmal atrial fibrillation.  3.  Status post pacer for the above.  4.  Orthostatic lightheadedness.  5.  Progressive exercise intolerance.   Ricky [name] is doing quite well.  I am concerned about his exercise  intolerance and because he is AV paced 100% of the time we will plan to  check an echo to look at his left ventricular function.   He will also be looked at for the rocket trial.                                   Duke Salvia, MD, [facility]   SCK/MedQ  DD:  06/07/2006  DT:  06/08/2006  Job #:  [id number]

## 2011-03-25 NOTE — Discharge Summary (Signed)
NAME:  Ricky [name], Ricky [name]                            ACCOUNT NO.:  [id number]   MEDICAL RECORD NO.:  [id number]                   PATIENT TYPE:  INP   LOCATION:  3311                                 FACILITY:  [facility]   PHYSICIAN:  Ricky [name], M.D.        DATE OF BIRTH:  Jul 24, 1931   DATE OF ADMISSION:  08/26/2003  DATE OF DISCHARGE:  08/27/2003                                 DISCHARGE SUMMARY   ADMIT DIAGNOSES:  Greater than 80% right carotid stenosis.   PAST MEDICAL HISTORY:  1. Hypertension.  2. Hypercholesterolemia.  3. Status post pacemaker placement and is on Coumadin for this reason.  4. The patient denies any history of diabetes, previous myocardial     infarction, or history of congestive heart failure.  5. Bilateral carotid artery disease.   ALLERGIES:  No known drug allergies.   DISCHARGE DIAGNOSES:  80% right carotid artery stenosis status post right  carotid endarterectomy.   BRIEF HISTORY:  This is a 77 year old male that Ricky [name], M.D.  has been following for his bilateral carotid artery disease.  The patient  came in for routine follow-up with carotid duplex scan.  The patient denied  any history of stroke, TIAs, expressive or receptive aphasia, and amaurosis  fugax.  The patient denies tobacco use.  The patient denies chest pain and  any significant dyspnea on exertion.  The patient denied problems with  dizziness, blackouts, headaches, or seizures.  The carotid duplex scan shows  that his right carotid stenosis has progressed to greater than 80%.  On the  left side he has a 60-79% stenosis.  Due to the progression of the stenosis  in the right carotid artery, Ricky [name], M.D. recommendation was  for the patient to undergo a right carotid endarterectomy in order to lower  his risk of future stroke.   HOSPITAL COURSE:  The patient was admitted and taken to the OR on August 26, 2003 for right carotid endarterectomy with Dacron  patch angioplasty.  The patient tolerated procedure well.  The patient was hemodynamically  stable immediately postoperatively and was extubated without problem.  The  patient woke up from anesthesia neurologically intact.  The patient's  hospital course remained uncomplicated.  On date of discharge the patient  was without complaint and he was alert.  The patient was afebrile and vital  signs were stable.  On physical examination cardiac examination was regular  rate and rhythm.  Lungs were decreased breath sounds in bilateral bases.  Abdomen was soft, nontender, nondistended.  Bowel sounds were present.  The  patient has not had a bowel movement since surgery.  Neurologically the  patient is intact.  The incision was clean, dry, and intact.  There was no  edema or hematoma present.  The patient was able to eat and ambulate without  problem.  The patient is felt to be stable for discharge at this  time.   LABORATORIES:  CBC on August 27, 2003:  White count 7.2, hemoglobin 10.8,  hematocrit 32.3, platelets 194,000.  BMP on August 27, 2003:  Sodium 135,  potassium 4.5, BUN 18, creatinine 1.0, glucose 126.   CONDITION ON DISCHARGE:  Improved.   DISCHARGE MEDICATIONS:  1. The patient is to resume his home medications which include Celebrex 20     mg one p.o. daily.  2. Lipitor 20 mg one p.o. daily.  3. Coumadin 5 mg one p.o. daily.  4. Prevacid 30 mg one p.o. daily.  5. Altace 5 mg one p.o. daily.  6. Celexa 20 mg one p.o. daily.  7. Colace 100 mg two tablets p.o. daily.   PAIN MANAGEMENT:  Tylox one to two tablets q.4-6h. p.r.n. pain.   DISCHARGE INSTRUCTIONS:  The patient will receive written instructions on  activity level, diet, wound care, and his follow-up date with Ricky Kindle.  Edilia [name], M.D. is on Wednesday, September 17, 2003 at 9:10 a.m.  The patient  is to call [facility] office with any problems or questions.      Ricky Leisure, PA                      Ricky Kindle.  Edilia [name], M.D.    AY/MEDQ  D:  08/27/2003  T:  08/27/2003  Job:  [id number]   cc:   Ricky [name], M.D. [facility]   Ricky [name], M.D.

## 2011-03-25 NOTE — H&P (Signed)
NAME:  Ricky [name], Ricky [name] NO.:  [id number]   MEDICAL RECORD NO.:  [id number]                   PATIENT TYPE:  INP   LOCATION:                                       FACILITY:  [facility]   PHYSICIAN:  Ricky [name], M.D.        DATE OF BIRTH:  December 30, 1930   DATE OF ADMISSION:  08/26/2003  DATE OF DISCHARGE:                                HISTORY & PHYSICAL   REASON FOR ADMISSION:  Greater than 80% right carotid stenosis.   HISTORY OF PRESENT ILLNESS:  This is a pleasant 77 year old gentleman who I  have been following with bilateral carotid disease.  He came in for a  routine follow-up with carotid Duplex scan.  Since I had seen him last in  April of this year, he had denied any history of stroke, TIA's, expressive  or receptive aphasia, or amaurosis fugax.   PAST MEDICAL HISTORY:  Significant for hypertension and  hypercholesterolemia.  In addition, he has had a pacemaker placed in the  past and is on Coumadin for this reason.  He denies any history of diabetes,  history of previous myocardial infarction, or history of congestive heart  failure.   FAMILY HISTORY:  There is no history of premature cardiovascular disease.   SOCIAL HISTORY:  He is married.  He does not use tobacco.   ALLERGIES:  No known drug allergies.   MEDICATIONS:  1. Celebrex 20 mg p.o. daily.  2. Lipitor 20 mg p.o. daily.  3. Coumadin 5 mg p.o. daily.  4. Prevacid 30 mg p.o. daily.  5. Altace 5 mg p.o. daily.  6. Celexa 20 mg p.o. daily.  7. Colace 100 mg p.o. daily.   REVIEW OF SYSTEMS:  The patient has had some weight gain recently.  He  weighs 215 pounds and is 6 feet tall.  He has had no problems with his  appetite and no problems with generalized weakness.  He denies any history  of chest pain, chest pressure, or significant dyspnea on exertion.  He has  had no problems with palpitations. He has had no problems with bronchitis,  asthma, or wheezing. He has had no  recent change in his bowel habits.  He  does have a history of a hiatal hernia.  He denies any dysuria or frequency.  He has had no claudication, rest pain, or nonhealing ulcers. He denies any  history of DVT or phlebitis.  He has had no problems with dizziness,  blackouts, headaches, or seizures.  He has no bleeding problems or clotting  disorders that he is aware of. He has no significant arthritis or joint  pain.  He denies any problems with depression.   PHYSICAL EXAMINATION:  VITAL SIGNS: Blood pressure 138/60, heart rate is 88.  He has bilateral carotid bruits.  LUNGS:  Clear bilaterally to auscultation.  HEART:  He has a regular rate and rhythm.  ABDOMEN:  Soft and nontender.  He has palpable femoral and pedal pulses.  NEUROLOGY:  Nonfocal.   His carotid Duplex scan shows that his right carotid stenosis has now  progressed to greater than 80%. Peak systolic velocity is 370 cm/second with  an end diastolic velocity of 103 cm/second.  On the left side, he has a 60  to 79% stenosis.  Both vertebral arteries are patent with normally directed  flow.   Given the progression of his stenosis in the right coronary artery, I have  recommended right carotid endarterectomy in order to lower his risk of  future stroke.  We have discussed the indications of the procedure and the  potential complications including, but not limited to bleeding, stroke  (perioperative risk of 1 to 2%), MI, nerve injury, or other unpredictable  medical problems. All of his questions were answered and he is agreeable to  proceed. Of note, we will stop his Coumadin prior to his surgery and then  restart that postoperatively.                                                Ricky [name], M.D.    CSD/MEDQ  D:  08/20/2003  T:  08/20/2003  Job:  [id number]   cc:   Ricky [name], M.D. [facility]   Ricky [name], M.D.

## 2011-03-25 NOTE — Op Note (Signed)
NAME:  Ricky [name], Ricky [name]                            ACCOUNT NO.:  [id number]   MEDICAL RECORD NO.:  [id number]                   PATIENT TYPE:  INP   LOCATION:  2889                                 FACILITY:  [facility]   PHYSICIAN:  Di Kindle. Edilia Bo, M.D.        DATE OF BIRTH:  1930/12/14   DATE OF PROCEDURE:  08/26/2003  DATE OF DISCHARGE:                                 OPERATIVE REPORT   PREOPERATIVE DIAGNOSIS:  Asymptomatic greater than 80% right carotid  stenosis.   POSTOPERATIVE DIAGNOSIS:  Asymptomatic greater than 80% right carotid  stenosis.   PROCEDURE:  Right carotid endarterectomy with Dacron patch angioplasty.   ANESTHESIA:  General.   SURGEON:  Di Kindle. Edilia Bo, M.D.   ASSISTANT:  Jerold Coombe, P.A.   INDICATIONS FOR PROCEDURE:  This is a 77 year old gentleman  who I have been  following  with bilateral carotid stenoses which are asymptomatic. The  stenosis on the right side progressed to greater than 80% and a right  carotid endarterectomy was recommended in order to lower his risks of future  stroke. The procedure and potential complications were discussed  with the  patient in the office preoperatively. All of his questions were answered and  he was agreeable to proceed.   DESCRIPTION OF PROCEDURE:  The patient was taken to the operating room and  an arterial line was placed by anesthesia. He received a general anesthetic.  The right neck  was prepped and draped in the usual sterile fashion.   An incision was made along the anterior border of the sternocleidomastoid  and the dissection was carried down to the common carotid artery which was  dissected free and controlled with a Remel tourniquet. The facial vein was  divided between 2-0 silk ties and then  the internal carotid artery was  controlled. Of noted the plaque extended very high up the internal carotid  artery. The external carotid artery was controlled with a blue vessel loop.   The  patient was then heparinized with 10,000 units of IV heparin. Clamps  were then placed on the internal, then the external, then the common carotid  artery. A longitudinal arteriotomy was made in the common carotid artery and  this was extended up proximally. The plaque extended very high up, but I was  able to place a 12 shunt into the internal carotid artery backbled and then  placed into the common carotid artery.   Next the endarterectomy plane was established proximally  and the plaque was  sharply divided. Eversion endarterectomy was performed to the external  carotid artery. Distally as the plaque went very high, it was felt that the  only way to safely get this out was to do it without the shunt; therefore  the shunt was removed and the vessel was clamped. Distally there was a nice  taper in the plaque and no tacking sutures were required. The vessel was  then irrigated with copious amounts of heparin and Dextran and all loose  debris was removed.   The artery was closed with a Dacron patch. It was sewn using 2 continuous 6-  0 Prolene sutures. Prior to completing the patch closure, the vessels were  backbled and flushed appropriately, then the anastomosis was completed. The  flow was reestablished, first to the external carotid artery and then to the  internal carotid artery. At the completion there was a good pulse distal  to  the patch and a good Doppler signal. Hemostasis was achieved in the wounds.   The heparin was partially reversed with the Protamine. The wound was closed  with a deep layer of 3-0 Vicryl. The platysma was closed with running 3-0  Vicryl. The skin was closed with a 4-0 subcuticular stitch. A sterile  dressing was applied.   The patient tolerated the procedure well and was transferred to the recovery  room in satisfactory condition. All needle  and sponge counts were correct.                                               Di Kindle. Edilia Bo,  M.D.    CSD/MEDQ  D:  08/26/2003  T:  08/26/2003  Job:  [id number]   cc:   [name], M.D. [facility]   Maisie Fus D. [name], M.D.

## 2011-04-05 ENCOUNTER — Ambulatory Visit (INDEPENDENT_AMBULATORY_CARE_PROVIDER_SITE_OTHER): Payer: Medicare Other | Admitting: *Deleted

## 2011-04-05 DIAGNOSIS — Z7901 Long term (current) use of anticoagulants: Secondary | ICD-10-CM

## 2011-04-05 DIAGNOSIS — I679 Cerebrovascular disease, unspecified: Secondary | ICD-10-CM

## 2011-04-05 DIAGNOSIS — I4891 Unspecified atrial fibrillation: Secondary | ICD-10-CM

## 2011-04-21 ENCOUNTER — Telehealth: Payer: Self-pay | Admitting: *Deleted

## 2011-04-21 NOTE — Telephone Encounter (Signed)
Thanked wife for calling with med change. Informed her that this med will not interact with coumadin.

## 2011-04-22 ENCOUNTER — Other Ambulatory Visit: Payer: Self-pay | Admitting: *Deleted

## 2011-04-22 MED ORDER — WARFARIN SODIUM 5 MG PO TABS: ORAL_TABLET | ORAL | Status: DC

## 2011-04-25 ENCOUNTER — Telehealth: Payer: Self-pay | Admitting: Pulmonary Disease

## 2011-04-25 NOTE — Telephone Encounter (Signed)
Pt needs an appt.  LMOM TCB x1.

## 2011-04-26 ENCOUNTER — Telehealth: Payer: Self-pay | Admitting: Pulmonary Disease

## 2011-04-26 NOTE — Telephone Encounter (Signed)
Error/dup message ° °

## 2011-04-26 NOTE — Telephone Encounter (Signed)
Patient returning call.

## 2011-04-26 NOTE — Telephone Encounter (Signed)
Spoke with pt wife and advised that the pt needs an appt. i offered an appt this afternoon with TP but she states that is too late in the PM.  TP is out of office tomorrow and SN does not have any appts. Pt then requests Thursday, so appt set for Thursday at 9:45am. I advised if he develops a fever or anything changes to go to urgent care or ER near their home. Carron Curie, CMA

## 2011-04-28 ENCOUNTER — Encounter: Payer: Self-pay | Admitting: Adult Health

## 2011-04-28 ENCOUNTER — Ambulatory Visit (INDEPENDENT_AMBULATORY_CARE_PROVIDER_SITE_OTHER): Payer: Medicare Other | Admitting: Adult Health

## 2011-04-28 DIAGNOSIS — T148 Other injury of unspecified body region: Secondary | ICD-10-CM

## 2011-04-28 DIAGNOSIS — J209 Acute bronchitis, unspecified: Secondary | ICD-10-CM

## 2011-04-28 DIAGNOSIS — W57XXXA Bitten or stung by nonvenomous insect and other nonvenomous arthropods, initial encounter: Secondary | ICD-10-CM

## 2011-04-28 DIAGNOSIS — T148XXA Other injury of unspecified body region, initial encounter: Secondary | ICD-10-CM

## 2011-04-28 MED ORDER — DOXYCYCLINE HYCLATE 100 MG PO TABS: 100.0000 mg | ORAL_TABLET | Freq: Two times a day (BID) | ORAL | Status: AC

## 2011-04-28 NOTE — Assessment & Plan Note (Signed)
Tick bite w/ attached tick removed 04/15/11  Plan;  Doxycycline x 7 days  Please contact office for sooner follow up if symptoms do not improve or worsen or seek emergency care

## 2011-04-28 NOTE — Assessment & Plan Note (Signed)
Flare   Plan  Doxycycline 100mg  Twice daily  For 7 days  Mucinex DM Twice daily  As needed  Cough/congestion

## 2011-04-28 NOTE — Progress Notes (Signed)
Subjective:    Patient ID: Ricky [name], male    DOB: March 01, 1931, 77 y.o.   MRN: [id number]  HPI 77 y/o WM here for a follow up visit... he has multiple medical problems including:  OSA;  CAD- s/p CABG;  Hx AFib/ CHB/ Pacer;  Cerebrovasc dis- s/p RtCAE;  Hyperchol;  HH/ Gastritis/ hx gastric volvulus w/ surg2009;  Divertics/ colon polyps;  BPH w/ boo;  DJD;  Early dementia w/ personality changes;  Hx anemia w/ Fe malaborption- resolved...  ~  August 13, 2010:  he had f/u appts w/ [name] & [name]- stable cardiac, 2DEcho w/ mild LVH (see report), [name] had lengthy discussion & rec antidepressant Rx but clearly he is demented & wife's afraid of his aggression & belligerence> rec- Neuro eval & trial Seroquel in the interim... f/u fasting labs (all essent WNL), & OK Flu shot...  ~  February 14, 2011:  26mo ROV & doing satis- he has no real complaints mostly due to his dementia, wife notes that he has some incontinence esp in AM & he is freq "wet" so we discussed trial of Enablex 7.5mg  to OB;  Also not resting at night & wife is still having a rough time w/ his behavior & requesting incr Seroquel to 50mg  Qhs;  He had decr hearing assoc w/ bilat cerumen impactions- removed by Gayla Medicus 2/12 & improved;  Finally he notes runny nose as well & while wife thinks it's not that bad- he wants Rx & we wrote for Astepro Prn use...  ~04/28/11 Acute OV  Pt presents for a work in visit. Found a tick along left lower ankle on 04/15/11. Tick was attached. Entire tick was removed. It was very small -dark black . Has had a small red spot since it was removed . NO drainage, rash , joint pain or fever.   Also has increased cough/congestion for last several weeks. Worse since it has been hot outside -temps >90 outside. Mucus is thick yellow. No fever or hemoptysis . No OTC used.        Problem List:  OBSTRUCTIVE SLEEP APNEA (ICD-327.23) - sleep study 5/03 showed RDI=27 w/ desat to 86% & mod snoring... he refused CPAP Rx, and  denies daytime hypersomnolence... apparently snoring doesn't disturb wife & pt rests adeq but wakes daily at 4-5AM, "I sleep better in my recliner in front of the TV"... he does not want further intervention.  ATHEROSCLEROTIC HEART DISEASE (ICD-414.00) & CARDIOMYOPATHY, ISCHEMIC S/P CABG (ICD-414.8) - on LISINOPRIL 10mg /d; followed by [name] Graciela Husbands for Cardiology...  ~  s/p CABG 9/00 and AFlutter ablation w/ pacer in 1998... ~  8/11:  f/u by [name]- stable, no changes made... ~  2DEcho 9/11 showed mild LVH, EF=55% w/ dyssynergy c/w RV pacing, biatrial enlargem, mild RV dil & pulm HTN.  ATRIAL FIBRILLATION, HX OF (ICD-V12.59), AV BLOCK, COMPLETE (ICD-426.0), & PACEMAKER, PERMANENT DDD STJ (ICD-V45.01) - on COUMADIN and followed in the [facility]... he has a pacemaker for complete heart block & continues w/ regular pacer checks per Cards.  CEREBROVASCULAR DISEASE (ICD-437.9) - on ASA 81mg /d... he had a TIA in 2004 w/ carotid eval showing high grade right carotid obstruction... s/p right CAE by DrCDickson 10/04... CDopplers per [facility]... ~  CDoppler 1/09 showed 60-79% left ICA stenosis, & normal right ICA- s/p surg. ~  CDoppler 1/10 showed  patent right ICA with no evidence of restenosis, 60-79% stenosis of the left ICA. ~  CT Brain 10/10 showed atrophy & sm  vessel dis, NAD. ~  CDoppler 1/11 showed patent right CAE site with no ICA stenosis, 60%-79% stenosis of the left ICA (no change from 7/10).  HYPERCHOLESTEROLEMIA (ICD-272.0) - on SIMVASTATIN 40mg /d + low chol diet... ~  FLP 7/08 on Lip40 showed TChol 143, TG 173, HDL 40, LDL 69 ~  FLP 1/09 showed TChol 176, TG 155, HDL 42, LDL 103... Lipitor changed to Simva80 for $$$. ~  FLP 7/09 on Simva80 showed TChol 120, TG 150, HDL 42, LDL 48... rec- keep same. ~  FLP 1/10 on Simva80 showed TChol 149, TG 143, HDL 44, LDL 77 ~  9/10: [name] decr his Simva80 to Simva40- wife states "the government cut it back"... ~  FLP 10/10 on Simva40 showed  TChol 183, TG 199, HDL 45, LDL 99 ~  FLP 10/11 on Simva40 showed TChol 160, TG 196, HDL 54, LDL 67 ~  FLP 4/12 on Simva40 showed TChol 153, TG 156, HDL 46, LDL 76  GASTRITIS (ICD-535.50) - on OMEPRAZOLE 20mg /d... prev treated for HPylori in 2007...he has a large HH and last EGD 6/08 by Dorris Singh revealed an esoph stricture- dilated... hosp 4/09 w/ part gastric volvulus and most of stomach in chest... EGD showed massive HH, no lesions seen... Aug09 [name] did laparosopically reduced HH w/ repair of the hiatus & gastropexy to keep it reduced (Nissen not done).  DIVERTICULOSIS OF COLON (ICD-562.10) & COLONIC POLYPS (ICD-211.3) - 3mm adenomatous polyp removed from ascending colon in 2001... last colonoscopy by [facility] 4/04 w/ divertics + polyp... he's been eval for subseq rectal bleeding w/ fissure dx'd and Rx w/ botox 7/08 by [name]... he uses PROCTOCORT Cream Prn...  BENIGN PROSTATIC HYPERTROPHY, WITH OBSTRUCTION (ICD-600.01) - eval by DrPeterson 2010 for obstructive symptoms and hematuria (related to his coumadin)... neg cysto & started on AVODART 5mg /d... ~  labs 10/11 showed PSA= 1.00 ~   4/12:  new overactive bladder symptoms & we decided to try Enablex Qhs...   DEGENERATIVE JOINT DISEASE (ICD-715.90)  MEMORY LOSS (ICD-780.93) - 10/10 OV wife voiced concern regarding memory loss & personality change- more argumentative, belligerent etc... MMSE showed changes and we decided to Rx w/ ARICEPT 10mg /d... ~  12/11:  He had eval DrDohmeier> note reviewed, she agreed w/ Aricept, consider Namenda; she checked EEG- neg, non focal...  ANXIETY (ICD-300.00) - he's on ALPRAZOLAM 0.25mg  as needed.  Hx of ANEMIA (ICD-285.9) - he has been eval by [name] w/ an iron malabsorption anemia (bone marrow in 2001 w/ absent iron stores)... prev Rx w/ parenteral iron infusions... ~  labs 10/10 by [name] showed Hg= 14.6, Fe= 107, Ferritin= 120... ~  labs here 10/11 showed Hg= 15.2, MCV= 97 > hasn't  required IV Fe in over 71yrs & [name] signed off. ~  Labs here 4/12 showed Hg= 14.4  SHINGLES (ICD-053.9) - presented 4/11 w/ right C2 shingles> Rx w/ ACYCLOVIR, MEDROL, VICODIN... we discussed Shingles Vaccine as well & they will think about it...  Health Maintenance ~  Immunizations:  had PNEUMOVAX 2007... received 2010 Flu shot 10/8...     Review of Systems Constitutional:   No  weight loss, night sweats,  Fevers, chills,  +fatigue, or  lassitude.  HEENT:   No headaches,  Difficulty swallowing,  Tooth/dental problems, or  Sore throat,                No sneezing, itching, ear ache, nasal congestion, post nasal drip,   CV:  No chest pain,  Orthopnea, PND, swelling in lower extremities, anasarca,  dizziness, palpitations, syncope.   GI  No heartburn, indigestion, abdominal pain, nausea, vomiting, diarrhea, change in bowel habits, loss of appetite, bloody stools.   Resp:  No coughing up of blood.    No chest wall deformity  Skin: no rash or lesions.  GU: no dysuria, change in color of urine, no urgency or frequency.  No flank pain, no hematuria   MS:  No joint pain or swelling.  No decreased range of motion.    Psych:  No change in mood or affect. No depression or anxiety.           Objective:   Physical Exam GEN: A/Ox3; pleasant , NAD, elderly   HEENT:  [place]/AT,  EACs-clear, TMs-wnl, NOSE-clear, THROAT-clear, no lesions, no postnasal drip or exudate noted.   NECK:  Supple w/ fair ROM; no JVD; normal carotid impulses w/o bruits; no thyromegaly or nodules palpated; no lymphadenopathy.  RESP  Coarse BS w/ no wheezing no accessory muscle use, no dullness to percussion  CARD:  RRR, no m/r/g  , no peripheral edema, pulses intact, no cyanosis or clubbing.  GI:   Soft & nt; nml bowel sounds; no organomegaly or masses detected.  Musco: Warm bil, no deformities or joint swelling noted.   Neuro: alert, no focal deficits noted.    Skin: Warm, along ;left lower medial  ankle with small circular reddened area <0.25cm , no induration center slightly dark red/purple nontender           Assessment & Plan:

## 2011-04-28 NOTE — Patient Instructions (Signed)
Doxycyline 100mg   Twice daily  For 7 days  Mucinex DM Twice daily  As needed  Cough/congestion  Wash area daily with soap and water, pat dry , call back if does not improve or worsens Please contact office for sooner follow up if symptoms do not improve or worsen or seek emergency care  follow up as planned

## 2011-05-03 ENCOUNTER — Ambulatory Visit (INDEPENDENT_AMBULATORY_CARE_PROVIDER_SITE_OTHER): Payer: Medicare Other | Admitting: *Deleted

## 2011-05-03 DIAGNOSIS — I679 Cerebrovascular disease, unspecified: Secondary | ICD-10-CM

## 2011-05-03 DIAGNOSIS — I4891 Unspecified atrial fibrillation: Secondary | ICD-10-CM

## 2011-05-03 DIAGNOSIS — Z7901 Long term (current) use of anticoagulants: Secondary | ICD-10-CM

## 2011-05-03 LAB — POCT INR: INR: 2.3

## 2011-05-05 ENCOUNTER — Encounter: Payer: Self-pay | Admitting: Internal Medicine

## 2011-05-05 ENCOUNTER — Telehealth: Payer: Self-pay | Admitting: Pulmonary Disease

## 2011-05-05 DIAGNOSIS — I4891 Unspecified atrial fibrillation: Secondary | ICD-10-CM

## 2011-05-05 MED ORDER — ALPRAZOLAM 0.25 MG PO TABS: ORAL_TABLET | ORAL | Status: DC

## 2011-05-05 NOTE — Telephone Encounter (Signed)
Called the rx for the alprazolam into the pharamcy.  Called and spoke with pts wife and she is aware of rx called to the pharmacy

## 2011-05-16 ENCOUNTER — Telehealth: Payer: Self-pay | Admitting: Pulmonary Disease

## 2011-05-16 DIAGNOSIS — F411 Generalized anxiety disorder: Secondary | ICD-10-CM

## 2011-05-16 MED ORDER — SIMVASTATIN 40 MG PO TABS: 40.0000 mg | ORAL_TABLET | Freq: Every day | ORAL | Status: DC

## 2011-05-16 NOTE — Telephone Encounter (Signed)
Pt needing Simvastatin called in to [facility] in [place]. RX sent. Spouse aware.

## 2011-05-31 ENCOUNTER — Ambulatory Visit (INDEPENDENT_AMBULATORY_CARE_PROVIDER_SITE_OTHER): Payer: Medicare Other | Admitting: *Deleted

## 2011-05-31 DIAGNOSIS — I679 Cerebrovascular disease, unspecified: Secondary | ICD-10-CM

## 2011-05-31 DIAGNOSIS — Z7901 Long term (current) use of anticoagulants: Secondary | ICD-10-CM

## 2011-05-31 DIAGNOSIS — I4891 Unspecified atrial fibrillation: Secondary | ICD-10-CM

## 2011-06-20 ENCOUNTER — Encounter: Payer: Self-pay | Admitting: Cardiology

## 2011-06-20 ENCOUNTER — Ambulatory Visit (INDEPENDENT_AMBULATORY_CARE_PROVIDER_SITE_OTHER): Payer: Medicare Other | Admitting: Cardiology

## 2011-06-20 DIAGNOSIS — E785 Hyperlipidemia, unspecified: Secondary | ICD-10-CM

## 2011-06-20 DIAGNOSIS — I2510 Atherosclerotic heart disease of native coronary artery without angina pectoris: Secondary | ICD-10-CM

## 2011-06-20 DIAGNOSIS — I4891 Unspecified atrial fibrillation: Secondary | ICD-10-CM

## 2011-06-20 DIAGNOSIS — Z79899 Other long term (current) drug therapy: Secondary | ICD-10-CM

## 2011-06-20 DIAGNOSIS — E7800 Pure hypercholesterolemia, unspecified: Secondary | ICD-10-CM

## 2011-06-20 LAB — HEPATIC FUNCTION PANEL
ALT: 17 U/L (ref 0–53)
AST: 27 U/L (ref 0–37)
Alkaline Phosphatase: 73 U/L (ref 39–117)
Bilirubin, Direct: 0.1 mg/dL (ref 0.0–0.3)
Total Bilirubin: 0.6 mg/dL (ref 0.3–1.2)

## 2011-06-20 LAB — LIPID PANEL: Total CHOL/HDL Ratio: 3

## 2011-06-20 NOTE — Patient Instructions (Signed)
Your physician recommends that you schedule a follow-up appointment in: YEAR WITH  DR [name]  Your physician recommends that you continue on your current medications as directed. Please refer to the Current Medication list given to you today.  Your physician recommends that you return for lab work in: TODAY LIPID AND LIVER DX 272.4 V58.69

## 2011-06-20 NOTE — Progress Notes (Signed)
HPI:  [name] is in for follow up. He remains stable.  Not too much issue at present.  He is not very active according to his wife.  His hips and tail bother him, which is why he claims he does not do much.  No chest pain.  His wife says his memory is progressively worse, but that Ricky [name] has helped.    Current Outpatient Prescriptions  Medication Sig Dispense Refill  . acetaminophen (TYLENOL) 500 MG tablet Take 500 mg by mouth daily.        Marland Kitchen ALPRAZolam (XANAX) 0.25 MG tablet 1/2 tablet to 1 tablet by mouth three times a day as needed for nerves.  90 tablet  5  . darifenacin (ENABLEX) 7.5 MG 24 hr tablet Take 7.5 mg by mouth daily.        Marland Kitchen dextromethorphan-guaiFENesin (MUCINEX DM) 30-600 MG per 12 hr tablet Take 1 tablet by mouth every 12 (twelve) hours.        . Diphenhydramine-APAP, sleep, (TYLENOL PM EXTRA STRENGTH PO) Take 1 tablet by mouth at bedtime as needed.        . donepezil (ARICEPT) 10 MG tablet Take 1 tablet (10 mg total) by mouth at bedtime as needed.  30 tablet  11  . finasteride (PROSCAR) 5 MG tablet Take 5 mg by mouth daily.        Marland Kitchen lisinopril (PRINIVIL,ZESTRIL) 10 MG tablet Take 1 tablet (10 mg total) by mouth daily.  30 tablet  11  . magnesium oxide (MAG-OX) 400 MG tablet Take 400 mg by mouth 2 (two) times daily.        . memantine (NAMENDA) 5 MG tablet Take 5 mg by mouth. 5mg  daily for 1st week, 5mg s BID for 2nd week, on 04/23/11 3rd week he will take 10mg s in AM and 5mg s in PM, and 4th week 10mg s BID.       . Multiple Vitamins-Minerals (MULTIVITAMIN WITH MINERALS) tablet Take 1 tablet by mouth daily.        . QUEtiapine (SEROQUEL) 50 MG tablet Take 1 tablet (50 mg total) by mouth at bedtime.  30 tablet  6  . simvastatin (ZOCOR) 40 MG tablet Take 1 tablet (40 mg total) by mouth at bedtime.  30 tablet  5  . warfarin (COUMADIN) 5 MG tablet Take as directed by Anticoagulation clinic   30 tablet  3    No Known Allergies  Past Medical History  Diagnosis Date  . Obstructive  sleep apnea   . Atherosclerotic heart disease   . Ischemic cardiomyopathy   . Atrial fibrillation   . AV block, complete   . Pacemaker     permanent DDD STJ  . Cerebrovascular disease   . Hypercholesterolemia   . Hiatal hernia   . Gastritis   . Diverticulosis   . Colonic polyp   . BPH (benign prostatic hypertrophy)   . DJD (degenerative joint disease)   . Memory loss   . Anxiety   . Anemia   . Shingles     Past Surgical History  Procedure Date  . Pacemaker insertion   . Coronary artery bypass graft     x4 by Dr. Sena Hitch 9/00  . Carotid endarterectomy     right 10/04 Dr. Edilia Bo  . Anal fissure repair   . Hiatal hernia repair     incarcerated stomach with takedown and repair of hiatus and gastropexy    Family History  Problem Relation Age of Onset  . Coronary artery disease  Other     History   Social History  . Marital Status: Married    Spouse Name: Ricky [name]    Number of Children: 1  . Years of Education: N/A   Occupational History  . retired   .     Social History Main Topics  . Smoking status: Former Smoker -- 1.5 packs/day for 50 years    Types: Cigarettes    Quit date: 11/08/1995  . Smokeless tobacco: Never Used  . Alcohol Use: No     quit drinking back in 03/27/1976  . Drug Use: No  . Sexually Active: Not on file   Other Topics Concern  . Not on file   Social History Narrative   Married, wife Ricky [name], 61yrs1 daughter from 1st marriage who lives nearbySon died 27-Mar-2010 in tractor accident.    ROS: Please see the HPI.  All other systems reviewed and negative.  PHYSICAL EXAM:  BP 148/88  Pulse 70  Resp 16  Ht 6' (1.829 m)  Wt 205 lb (92.987 kg)  BMI 27.80 kg/m2  General: Well developed, well nourished, in no acute distress. Head:  Normocephalic and atraumatic. Neck: no JVD Lungs: Clear to auscultation and percussion. Heart: Normal S1.  Paradoxical S2.  No murmur, rubs or gallops.  Abdomen:  Normal bowel sounds; soft; non tender; no  organomegaly Pulses: Pulses normal in all 4 extremities. Extremities: No clubbing or cyanosis. No edema. Neurologic: Alert and oriented x 3.  EKG:  V pacing.  Underlying atrial fibrillation.   ASSESSMENT AND PLAN:                               HPI:  Current Outpatient Prescriptions  Medication Sig Dispense Refill  . acetaminophen (TYLENOL) 500 MG tablet Take 500 mg by mouth daily.        Marland Kitchen ALPRAZolam (XANAX) 0.25 MG tablet 1/2 tablet to 1 tablet by mouth three times a day as needed for nerves.  90 tablet  5  . darifenacin (ENABLEX) 7.5 MG 24 hr tablet Take 7.5 mg by mouth daily.        Marland Kitchen dextromethorphan-guaiFENesin (MUCINEX DM) 30-600 MG per 12 hr tablet Take 1 tablet by mouth every 12 (twelve) hours.        . Diphenhydramine-APAP, sleep, (TYLENOL PM EXTRA STRENGTH PO) Take 1 tablet by mouth at bedtime as needed.        . donepezil (ARICEPT) 10 MG tablet Take 1 tablet (10 mg total) by mouth at bedtime as needed.  30 tablet  11  . finasteride (PROSCAR) 5 MG tablet Take 5 mg by mouth daily.        Marland Kitchen lisinopril (PRINIVIL,ZESTRIL) 10 MG tablet Take 1 tablet (10 mg total) by mouth daily.  30 tablet  11  . magnesium oxide (MAG-OX) 400 MG tablet Take 400 mg by mouth 2 (two) times daily.        . memantine (NAMENDA) 5 MG tablet Take 5 mg by mouth. 5mg  daily for 1st week, 5mg s BID for 2nd week, on 04/23/11 3rd week he will take 10mg s in AM and 5mg s in PM, and 4th week 10mg s BID.       . Multiple Vitamins-Minerals (MULTIVITAMIN WITH MINERALS) tablet Take 1 tablet by mouth daily.        . QUEtiapine (SEROQUEL) 50 MG tablet Take 1 tablet (50 mg total) by mouth at bedtime.  30 tablet  6  . simvastatin (ZOCOR)  40 MG tablet Take 1 tablet (40 mg total) by mouth at bedtime.  30 tablet  5  . warfarin (COUMADIN) 5 MG tablet Take as directed by Anticoagulation clinic   30 tablet  3    No Known Allergies  Past Medical History  Diagnosis Date  . Obstructive sleep apnea   .  Atherosclerotic heart disease   . Ischemic cardiomyopathy   . Atrial fibrillation   . AV block, complete   . Pacemaker     permanent DDD STJ  . Cerebrovascular disease   . Hypercholesterolemia   . Hiatal hernia   . Gastritis   . Diverticulosis   . Colonic polyp   . BPH (benign prostatic hypertrophy)   . DJD (degenerative joint disease)   . Memory loss   . Anxiety   . Anemia   . Shingles     Past Surgical History  Procedure Date  . Pacemaker insertion   . Coronary artery bypass graft     x4 by Dr. Sena Hitch 9/00  . Carotid endarterectomy     right 10/04 Dr. Edilia Bo  . Anal fissure repair   . Hiatal hernia repair     incarcerated stomach with takedown and repair of hiatus and gastropexy    Family History  Problem Relation Age of Onset  . Coronary artery disease Other     History   Social History  . Marital Status: Married    Spouse Name: Ricky [name]    Number of Children: 1  . Years of Education: N/A   Occupational History  . retired   .     Social History Main Topics  . Smoking status: Former Smoker -- 1.5 packs/day for 50 years    Types: Cigarettes    Quit date: 11/08/1995  . Smokeless tobacco: Never Used  . Alcohol Use: No     quit drinking back in 1976/03/25  . Drug Use: No  . Sexually Active: Not on file   Other Topics Concern  . Not on file   Social History Narrative   Married, wife Ricky [name], 71yrs1 daughter from 1st marriage who lives nearbySon died Mar 25, 2010 in tractor accident.    ROS: Please see the HPI.  All other systems reviewed and negative.  PHYSICAL EXAM:  BP 148/88  Pulse 70  Resp 16  Ht 6' (1.829 m)  Wt 205 lb (92.987 kg)  BMI 27.80 kg/m2  General: Well developed, well nourished, in no acute distress. Head:  Normocephalic and atraumatic. Neck: no JVD Lungs: Clear to auscultation and percussion. Heart: Normal S1 and S2.  No murmur, rubs or gallops.  Abdomen:  Normal bowel sounds; soft; non tender; no organomegaly Pulses: Pulses normal in  all 4 extremities. Extremities: No clubbing or cyanosis. No edema. Neurologic: Alert and oriented x 3.  EKG:  V pacing, rate 70. Underlying atrial fibrillation..   ASSESSMENT AND PLAN:

## 2011-06-20 NOTE — Progress Notes (Signed)
   Patient ID: Ricky [name], male    DOB: [birth year], 77 y.o.   MRN: [id number]  HPI    Review of Systems    Physical Exam

## 2011-06-24 ENCOUNTER — Ambulatory Visit (INDEPENDENT_AMBULATORY_CARE_PROVIDER_SITE_OTHER): Payer: Medicare Other | Admitting: Urology

## 2011-06-24 DIAGNOSIS — N401 Enlarged prostate with lower urinary tract symptoms: Secondary | ICD-10-CM

## 2011-06-24 DIAGNOSIS — N3941 Urge incontinence: Secondary | ICD-10-CM

## 2011-06-24 DIAGNOSIS — R3129 Other microscopic hematuria: Secondary | ICD-10-CM

## 2011-06-27 DIAGNOSIS — I2510 Atherosclerotic heart disease of native coronary artery without angina pectoris: Secondary | ICD-10-CM | POA: Insufficient documentation

## 2011-06-27 NOTE — Assessment & Plan Note (Signed)
Appropriate time to check lipid and liver.  Will go ahead and get them done.

## 2011-06-27 NOTE — Assessment & Plan Note (Signed)
Remains on warfarin for stroke prophylaxis.  Doing well with stable rate, and backup pacing.

## 2011-06-27 NOTE — Assessment & Plan Note (Signed)
Prior bypass and not currently with symptoms.

## 2011-06-28 ENCOUNTER — Other Ambulatory Visit: Payer: Self-pay | Admitting: Dermatology

## 2011-06-28 ENCOUNTER — Ambulatory Visit (INDEPENDENT_AMBULATORY_CARE_PROVIDER_SITE_OTHER): Payer: Medicare Other | Admitting: *Deleted

## 2011-06-28 DIAGNOSIS — I4891 Unspecified atrial fibrillation: Secondary | ICD-10-CM

## 2011-06-28 DIAGNOSIS — I679 Cerebrovascular disease, unspecified: Secondary | ICD-10-CM

## 2011-06-28 DIAGNOSIS — Z7901 Long term (current) use of anticoagulants: Secondary | ICD-10-CM

## 2011-06-28 LAB — POCT INR: INR: 2.5

## 2011-07-26 ENCOUNTER — Ambulatory Visit (INDEPENDENT_AMBULATORY_CARE_PROVIDER_SITE_OTHER): Payer: Medicare Other | Admitting: *Deleted

## 2011-07-26 DIAGNOSIS — I679 Cerebrovascular disease, unspecified: Secondary | ICD-10-CM

## 2011-07-26 DIAGNOSIS — I4891 Unspecified atrial fibrillation: Secondary | ICD-10-CM

## 2011-07-26 DIAGNOSIS — Z7901 Long term (current) use of anticoagulants: Secondary | ICD-10-CM

## 2011-07-27 ENCOUNTER — Other Ambulatory Visit: Payer: Self-pay | Admitting: Pulmonary Disease

## 2011-07-27 DIAGNOSIS — N401 Enlarged prostate with lower urinary tract symptoms: Secondary | ICD-10-CM

## 2011-07-27 DIAGNOSIS — E7800 Pure hypercholesterolemia, unspecified: Secondary | ICD-10-CM

## 2011-07-27 DIAGNOSIS — D649 Anemia, unspecified: Secondary | ICD-10-CM

## 2011-07-27 DIAGNOSIS — D126 Benign neoplasm of colon, unspecified: Secondary | ICD-10-CM

## 2011-08-02 LAB — PROTIME-INR: INR: 1.5

## 2011-08-02 LAB — OCCULT BLOOD X 1 CARD TO LAB, STOOL: Fecal Occult Bld: POSITIVE

## 2011-08-02 LAB — COMPREHENSIVE METABOLIC PANEL
AST: 26
BUN: 15
CO2: 31
Calcium: 9.7
Chloride: 98
Creatinine, Ser: 0.93
GFR calc Af Amer: >60
GFR calc non Af Amer: >60
Glucose, Bld: 167 — ABNORMAL HIGH
Total Bilirubin: 0.8

## 2011-08-02 LAB — SAMPLE TO BLOOD BANK

## 2011-08-02 LAB — CBC
HCT: 46.2
MCHC: 33.6
MCV: 94.6
Platelets: 180
RBC: 4.89
WBC: 11.1 — ABNORMAL HIGH
WBC: 8.2

## 2011-08-02 LAB — DIFFERENTIAL
Basophils Absolute: 0.1
Eosinophils Relative: 0
Lymphocytes Relative: 14
Lymphs Abs: 1.6
Neutro Abs: 9 — ABNORMAL HIGH
Neutrophils Relative %: 81 — ABNORMAL HIGH

## 2011-08-04 ENCOUNTER — Encounter: Payer: Self-pay | Admitting: Internal Medicine

## 2011-08-04 DIAGNOSIS — I4891 Unspecified atrial fibrillation: Secondary | ICD-10-CM

## 2011-08-05 LAB — APTT: aPTT: 42 — ABNORMAL HIGH

## 2011-08-05 LAB — PROTIME-INR: Prothrombin Time: 33.4 — ABNORMAL HIGH

## 2011-08-16 ENCOUNTER — Ambulatory Visit: Payer: Medicare Other | Admitting: Pulmonary Disease

## 2011-08-23 ENCOUNTER — Ambulatory Visit (INDEPENDENT_AMBULATORY_CARE_PROVIDER_SITE_OTHER): Payer: Medicare Other | Admitting: *Deleted

## 2011-08-23 DIAGNOSIS — Z7901 Long term (current) use of anticoagulants: Secondary | ICD-10-CM

## 2011-08-23 DIAGNOSIS — I679 Cerebrovascular disease, unspecified: Secondary | ICD-10-CM

## 2011-08-23 DIAGNOSIS — I4891 Unspecified atrial fibrillation: Secondary | ICD-10-CM

## 2011-08-23 LAB — POCT INR: INR: 1.8

## 2011-08-31 ENCOUNTER — Ambulatory Visit (INDEPENDENT_AMBULATORY_CARE_PROVIDER_SITE_OTHER): Payer: Medicare Other | Admitting: Internal Medicine

## 2011-08-31 ENCOUNTER — Encounter: Payer: Self-pay | Admitting: Internal Medicine

## 2011-08-31 DIAGNOSIS — Z950 Presence of cardiac pacemaker: Secondary | ICD-10-CM | POA: Insufficient documentation

## 2011-08-31 DIAGNOSIS — I442 Atrioventricular block, complete: Secondary | ICD-10-CM

## 2011-08-31 DIAGNOSIS — I2510 Atherosclerotic heart disease of native coronary artery without angina pectoris: Secondary | ICD-10-CM

## 2011-08-31 DIAGNOSIS — I4891 Unspecified atrial fibrillation: Secondary | ICD-10-CM

## 2011-08-31 LAB — PACEMAKER DEVICE OBSERVATION
AL IMPEDENCE PM: 567 Ohm
BAMS-0001: 180 {beats}/min
BATTERY VOLTAGE: 2.78 V
VENTRICULAR PACING PM: 99

## 2011-08-31 NOTE — Assessment & Plan Note (Signed)
Permanent; on warfarin 

## 2011-08-31 NOTE — Assessment & Plan Note (Signed)
Stable post pacing 

## 2011-08-31 NOTE — Progress Notes (Signed)
HPI  Ricky [name] is a 77 y.o. male seen in followup for coronary artery disease. He status post CABG. He also has atrial arrhythmias and  permanent atrial flutter ablation. He is status post AV junction ablation and pacemaker implantation.   The patient denies chest pain, shortness of breath, nocturnal dyspnea, orthopnea or peripheral edema.  There have been no palpitations, lightheadedness or syncope.   His wife has hurt her hip    Echocardiogram  Procedure date: 06/15/2006  Findings:  SUMMARY  - Overall left ventricular systolic function was mildly decreased.   Past Medical History  Diagnosis Date  . Obstructive sleep apnea   . Atherosclerotic heart disease   . Ischemic cardiomyopathy   . Atrial fibrillation   . AV block, complete   . Pacemaker     permanent DDD STJ  . Cerebrovascular disease   . Hypercholesterolemia   . Hiatal hernia   . Gastritis   . Diverticulosis   . Colonic polyp   . BPH (benign prostatic hypertrophy)   . DJD (degenerative joint disease)   . Memory loss   . Anxiety   . Anemia   . Shingles     Past Surgical History  Procedure Date  . Pacemaker insertion   . Coronary artery bypass graft     x4 by Dr. Sena Hitch 9/00  . Carotid endarterectomy     right 10/04 Dr. Edilia Bo  . Anal fissure repair   . Hiatal hernia repair     incarcerated stomach with takedown and repair of hiatus and gastropexy    Current Outpatient Prescriptions  Medication Sig Dispense Refill  . acetaminophen (TYLENOL) 500 MG tablet Take 500 mg by mouth daily.        Marland Kitchen ALPRAZolam (XANAX) 0.25 MG tablet 1/2 tablet to 1 tablet by mouth three times a day as needed for nerves.  90 tablet  5  . darifenacin (ENABLEX) 7.5 MG 24 hr tablet Take 7.5 mg by mouth daily.        Marland Kitchen dextromethorphan-guaiFENesin (MUCINEX DM) 30-600 MG per 12 hr tablet Take 1 tablet by mouth every 12 (twelve) hours.        . Diphenhydramine-APAP, sleep, (TYLENOL PM EXTRA STRENGTH PO) Take 1 tablet by mouth  at bedtime as needed.        . donepezil (ARICEPT) 10 MG tablet Take 1 tablet (10 mg total) by mouth at bedtime as needed.  30 tablet  11  . finasteride (PROSCAR) 5 MG tablet Take 5 mg by mouth daily.        Marland Kitchen lisinopril (PRINIVIL,ZESTRIL) 10 MG tablet Take 1 tablet (10 mg total) by mouth daily.  30 tablet  11  . magnesium oxide (MAG-OX) 400 MG tablet Take 400 mg by mouth 2 (two) times daily.        . memantine (NAMENDA) 5 MG tablet Take 5 mg by mouth. 5mg  daily for 1st week, 5mg s BID for 2nd week, on 04/23/11 3rd week he will take 10mg s in AM and 5mg s in PM, and 4th week 10mg s BID.       . Multiple Vitamins-Minerals (MULTIVITAMIN WITH MINERALS) tablet Take 1 tablet by mouth daily.        . QUEtiapine (SEROQUEL) 50 MG tablet Take 1 tablet (50 mg total) by mouth at bedtime.  30 tablet  6  . simvastatin (ZOCOR) 40 MG tablet Take 1 tablet (40 mg total) by mouth at bedtime.  30 tablet  5  . warfarin (COUMADIN) 5 MG tablet  Take as directed by Anticoagulation clinic   30 tablet  3    No Known Allergies  Review of Systems negative except from HPI and PMH  Physical Exam Well developed and well nourished in no acute distress HENT normal E scleral and icterus clear Neck Supple;scar JVP6-8 cm carotids brisk and full Clear to ausculation Regular rate and rhythm, no murmurs gallops or rub Soft with active bowel sounds No clubbing cyanosis and edema Alert and oriented, grossly normal motor and sensory function Skin Warm and Dry   Assessment and  Plan

## 2011-08-31 NOTE — Assessment & Plan Note (Signed)
The patient's device was interrogated.  The information was reviewed. No changes were made in the programming.    

## 2011-08-31 NOTE — Assessment & Plan Note (Signed)
Stable post bypass with out chest pain

## 2011-09-07 ENCOUNTER — Encounter: Payer: Self-pay | Admitting: Pulmonary Disease

## 2011-09-07 ENCOUNTER — Other Ambulatory Visit (INDEPENDENT_AMBULATORY_CARE_PROVIDER_SITE_OTHER): Payer: Medicare Other

## 2011-09-07 ENCOUNTER — Other Ambulatory Visit: Payer: Self-pay | Admitting: Pulmonary Disease

## 2011-09-07 ENCOUNTER — Ambulatory Visit (INDEPENDENT_AMBULATORY_CARE_PROVIDER_SITE_OTHER): Payer: Medicare Other | Admitting: Pulmonary Disease

## 2011-09-07 DIAGNOSIS — Z125 Encounter for screening for malignant neoplasm of prostate: Secondary | ICD-10-CM

## 2011-09-07 DIAGNOSIS — I679 Cerebrovascular disease, unspecified: Secondary | ICD-10-CM

## 2011-09-07 DIAGNOSIS — E7800 Pure hypercholesterolemia, unspecified: Secondary | ICD-10-CM

## 2011-09-07 DIAGNOSIS — I2510 Atherosclerotic heart disease of native coronary artery without angina pectoris: Secondary | ICD-10-CM

## 2011-09-07 DIAGNOSIS — F411 Generalized anxiety disorder: Secondary | ICD-10-CM

## 2011-09-07 DIAGNOSIS — Z23 Encounter for immunization: Secondary | ICD-10-CM

## 2011-09-07 DIAGNOSIS — M1990 Unspecified osteoarthritis, unspecified site: Secondary | ICD-10-CM

## 2011-09-07 DIAGNOSIS — I4891 Unspecified atrial fibrillation: Secondary | ICD-10-CM

## 2011-09-07 DIAGNOSIS — K449 Diaphragmatic hernia without obstruction or gangrene: Secondary | ICD-10-CM

## 2011-09-07 DIAGNOSIS — N401 Enlarged prostate with lower urinary tract symptoms: Secondary | ICD-10-CM

## 2011-09-07 DIAGNOSIS — Z950 Presence of cardiac pacemaker: Secondary | ICD-10-CM

## 2011-09-07 DIAGNOSIS — R413 Other amnesia: Secondary | ICD-10-CM

## 2011-09-07 DIAGNOSIS — D649 Anemia, unspecified: Secondary | ICD-10-CM

## 2011-09-07 DIAGNOSIS — D126 Benign neoplasm of colon, unspecified: Secondary | ICD-10-CM

## 2011-09-07 LAB — CBC WITH DIFFERENTIAL/PLATELET
Basophils Absolute: 0 10*3/uL (ref 0.0–0.1)
Eosinophils Absolute: 0.1 10*3/uL (ref 0.0–0.7)
Lymphocytes Relative: 32.8 % (ref 12.0–46.0)
MCHC: 33.8 g/dL (ref 30.0–36.0)
Monocytes Absolute: 0.6 10*3/uL (ref 0.1–1.0)
Neutrophils Relative %: 57.4 % (ref 43.0–77.0)
Platelets: 180 10*3/uL (ref 150.0–400.0)
RBC: 4.61 Mil/uL (ref 4.22–5.81)
RDW: 13.2 % (ref 11.5–14.6)

## 2011-09-07 LAB — BASIC METABOLIC PANEL
BUN: 15 mg/dL (ref 6–23)
CO2: 28 mEq/L (ref 19–32)
Calcium: 10.1 mg/dL (ref 8.4–10.5)
Creatinine, Ser: 1.2 mg/dL (ref 0.4–1.5)
Glucose, Bld: 99 mg/dL (ref 70–99)

## 2011-09-07 LAB — LIPID PANEL
Cholesterol: 174 mg/dL (ref 0–200)
Total CHOL/HDL Ratio: 4
Triglycerides: 217 mg/dL — ABNORMAL HIGH (ref 0.0–149.0)

## 2011-09-07 LAB — LDL CHOLESTEROL, DIRECT: Direct LDL: 102.1 mg/dL

## 2011-09-07 LAB — PSA: PSA: 0.91 ng/mL (ref 0.10–4.00)

## 2011-09-07 MED ORDER — QUETIAPINE FUMARATE 50 MG PO TABS: 50.0000 mg | ORAL_TABLET | Freq: Every day | ORAL | Status: DC

## 2011-09-07 NOTE — Patient Instructions (Addendum)
Today we updated your med list in our EPIC system...    Continue your current medications the same...  We gave you the 2012 Flu vaccine today...  Don't forget to elevate the head of your bed about 6"..    And don't eat or drink much after dinnertime in the evening...  Call for any questions...  Let's plan a follow up eval in 6 months.Marland KitchenMarland Kitchen

## 2011-09-18 ENCOUNTER — Encounter: Payer: Self-pay | Admitting: Pulmonary Disease

## 2011-09-18 NOTE — Progress Notes (Signed)
Subjective:    Patient ID: Ricky [name], male    DOB: 22-Mar-1931, 77 y.o.   MRN: [id number]  HPI  77 y/o WM here for a follow up visit... he has multiple medical problems including:  OSA;  CAD- s/p CABG;  Hx AFib/ CHB/ Pacer;  Cerebrovasc dis- s/p RtCAE;  Hyperchol;  HH/ Gastritis/ hx gastric volvulus w/ surg2009;  Divertics/ colon polyps;  BPH w/ boo;  DJD;  Early dementia w/ personality changes;  Hx anemia w/ Fe malaborption- resolved...  ~  August 13, 2010:  he had f/u appts w/ [name] & [name]- stable cardiac, 2DEcho w/ mild LVH (see report), [name] had lengthy discussion & rec antidepressant Rx but clearly he is demented & wife's afraid of his aggression & belligerence> rec- Neuro eval & trial Seroquel in the interim... f/u fasting labs (all essent WNL), & OK Flu shot...  ~  February 14, 2011:  85mo ROV & doing satis- he has no real complaints mostly due to his dementia (seen by DrDohmeier 12/11 & agreed w/ Aricept Rx), wife notes that he has some incontinence esp in AM & he is freq "wet" so we discussed trial of Enablex 7.5mg  to OB;  Also not resting at night & wife is still having a rough time w/ his behavior & requesting incr Seroquel to 50mg  Qhs;  He had decr hearing assoc w/ bilat cerumen impactions- removed by Gayla Medicus 2/12 & improved;  Finally he notes runny nose as well & while wife thinks it's not that bad- he wants Rx & we wrote for Astepro Prn use...  ~  September 07, 2011:  85mo ROV     CAD, s/p CABG> on Lisinopril10; BP= 124/80 & he is largely asymptomatic- denies CP, palpit, SOB, edema, etc...    AFib, AVBlock, Pacer> on Coumadin via CC; [name] checked pacer 10/12 & working well, no changes made...    Cerebrovasc dis w/ right CAE 2004> he has a known 60-79% left ICA stenosis & gets CDopplers yearly (stable x several yrs); stable w/o cerebral ischemic symptoms...    Hyperlipid> on Simva40 + diet efforts; FLP shows Chol ok but TG sl elev (see below); reviewed low fat diet recs...  GI> HH, Gastritis, Divertics, Polyps> on OTC Prilosec 20mg  as needed; treated for HPylori 2007; known massive HH s/p lap-reduced HH, repair of the hiatus & gastropexy 2009 by DrMartin...    BPH & OB> on Proscar5 & Enablex7.5; seen by DrWrenn 8/12 & stable, no changes made, f/u 43yr...    DJD> he uses Tylenol for pain as needed; he has no specific complaints...    Dementia> he has a senile dementia w/ behavioral changes that are really hard on the wife; on Aricept10 & Namenda10Bid; followed by DrDohmeier for Neuro, last seen 6/12 & Namenda added...    Anxiety/ Agitation> on Alprazolam 0.25mg  Prn and Seroquel 50mg  Qhs...        Problem List:  OBSTRUCTIVE SLEEP APNEA (ICD-327.23) - sleep study 5/03 showed RDI=27 w/ desat to 86% & mod snoring... he refused CPAP Rx, and denies daytime hypersomnolence... apparently snoring doesn't disturb wife & pt rests adeq but wakes daily at 4-5AM, "I sleep better in my recliner in front of the TV"... he does not want further intervention.  ATHEROSCLEROTIC HEART DISEASE (ICD-414.00) & CARDIOMYOPATHY, ISCHEMIC S/P CABG (ICD-414.8) - on LISINOPRIL 10mg /d; followed by [name] Graciela Husbands for Cardiology...  ~  s/p CABG 9/00 and AFlutter ablation w/ pacer in 1998... ~  8/11:  f/u by  [name]- stable, no changes made... ~  2DEcho 9/11 showed mild LVH, EF=55% w/ dyssynergy c/w RV pacing, biatrial enlargem, mild RV dil & pulm HTN. ~  He continues f/u w/ [name]-8/12 & [name]-10/12; CAD, AFib, AVjunctional ablation & pacemaker, on Coumadin; stable at last checks 7 no changes made...  ATRIAL FIBRILLATION, HX OF (ICD-V12.59), AV BLOCK, COMPLETE (ICD-426.0), & PACEMAKER, PERMANENT DDD STJ (ICD-V45.01) - on COUMADIN and followed in the [facility]... he has a pacemaker for complete heart block & continues w/ regular pacer checks per Cards.  CEREBROVASCULAR DISEASE (ICD-437.9) - on ASA 81mg /d... he had a TIA in 2004 w/ carotid eval showing high grade right carotid  obstruction... s/p right CAE by DrCDickson 10/04... CDopplers per [facility]... ~  CDoppler 1/09 showed 60-79% left ICA stenosis, & normal right ICA- s/p surg. ~  CDoppler 1/10 showed  patent right ICA with no evidence of restenosis, 60-79% stenosis of the left ICA. ~  CT Brain 10/10 showed atrophy & sm vessel dis, NAD. ~  CDoppler 1/11 showed patent right CAE site with no ICA stenosis, 60%-79% stenosis of the left ICA (no change from 7/10).  HYPERCHOLESTEROLEMIA (ICD-272.0) - on SIMVASTATIN 40mg /d + low chol diet... ~  FLP 7/08 on Lip40 showed TChol 143, TG 173, HDL 40, LDL 69 ~  FLP 1/09 showed TChol 176, TG 155, HDL 42, LDL 103... Lipitor changed to Simva80 for $$$. ~  FLP 7/09 on Simva80 showed TChol 120, TG 150, HDL 42, LDL 48... rec- keep same. ~  FLP 1/10 on Simva80 showed TChol 149, TG 143, HDL 44, LDL 77 ~  9/10: [name] decr his Simva80 to Simva40- wife states "the government cut it back"... ~  FLP 10/10 on Simva40 showed TChol 183, TG 199, HDL 45, LDL 99 ~  FLP 10/11 on Simva40 showed TChol 160, TG 196, HDL 54, LDL 67 ~  FLP 4/12 on Simva40 showed TChol 153, TG 156, HDL 46, LDL 76 ~  FLP 10/12 on Simva40 showed TChol 174, TG 217, HDL 49, LDL 102  GASTRITIS (ICD-535.50) - on OMEPRAZOLE 20mg /d... prev treated for HPylori in 2007...he has a large HH and last EGD 6/08 by Dorris Singh revealed an esoph stricture- dilated... hosp 4/09 w/ part gastric volvulus and most of stomach in chest... EGD showed massive HH, no lesions seen... Aug09 [name] did laparosopically reduced HH w/ repair of the hiatus & gastropexy to keep it reduced (Nissen not done).  DIVERTICULOSIS OF COLON (ICD-562.10) & COLONIC POLYPS (ICD-211.3) - 3mm adenomatous polyp removed from ascending colon in 2001... last colonoscopy by [facility] 4/04 w/ divertics + polyp... he's been eval for subseq rectal bleeding w/ fissure dx'd and Rx w/ botox 7/08 by [name]... he uses PROCTOCORT Cream Prn...  BENIGN PROSTATIC HYPERTROPHY, WITH  OBSTRUCTION (ICD-600.01) - eval by DrPeterson 2010 for obstructive symptoms and hematuria (related to his coumadin)... neg cysto & started on PROSCAR 5mg /d... ~  labs 10/11 showed PSA= 1.00 ~   4/12:  new overactive bladder symptoms & we decided to try Enablex Qhs...  ~  8/12:  He had Urology f/u DrWrenn- doing well on FINASTERIDE 5mg /d & ENABLEX 7.5mg /d; continue same... ~  Labs here 10/12 showed PSA= 0.91  DEGENERATIVE JOINT DISEASE (ICD-715.90)  MEMORY LOSS (ICD-780.93) - 10/10 OV wife voiced concern regarding memory loss & personality change- more argumentative, belligerent etc... MMSE showed changes and we decided to Rx w/ ARICEPT 10mg /d... ~  12/11:  He had eval DrDohmeier> note reviewed, she agreed w/ Aricept, consider Namenda; she checked  EEG- neg, non focal... ~  6/12:  F/u by Neurology w/ continued dementia & behavioral issues; they rec adding NAMENDA 10mg Bid...  ANXIETY (ICD-300.00) - he's on ALPRAZOLAM 0.25mg  as needed.  Hx of ANEMIA (ICD-285.9) - he has been eval by [name] w/ an iron malabsorption anemia (bone marrow in 2001 w/ absent iron stores)... prev Rx w/ parenteral iron infusions... ~  labs 10/10 by [name] showed Hg= 14.6, Fe= 107, Ferritin= 120... ~  labs here 10/11 showed Hg= 15.2, MCV= 97 > hasn't required IV Fe in over 16yrs & [name] signed off. ~  Labs here 4/12 showed Hg= 14.4 ~  Labs here 10/12 showed Hg= 15.1  SHINGLES (ICD-053.9) - presented 4/11 w/ right C2 shingles> Rx w/ ACYCLOVIR, MEDROL, VICODIN... we discussed Shingles Vaccine as well & they will think about it...  Health Maintenance ~  Immunizations:  had PNEUMOVAX 2007... received 2010 Flu shot 10/8...   Past Surgical History  Procedure Date  . Pacemaker insertion   . Coronary artery bypass graft     x4 by Dr. Sena Hitch 9/00  . Carotid endarterectomy     right 10/04 Dr. Edilia Bo  . Anal fissure repair   . Hiatal hernia repair     incarcerated stomach with takedown and repair  of hiatus and gastropexy    Outpatient Encounter Prescriptions as of 09/07/2011  Medication Sig Dispense Refill  . acetaminophen (TYLENOL) 500 MG tablet Take 500 mg by mouth daily.        Marland Kitchen ALPRAZolam (XANAX) 0.25 MG tablet 1/2 tablet to 1 tablet by mouth three times a day as needed for nerves.  90 tablet  5  . darifenacin (ENABLEX) 7.5 MG 24 hr tablet Take 7.5 mg by mouth daily.        Marland Kitchen dextromethorphan-guaiFENesin (MUCINEX DM) 30-600 MG per 12 hr tablet Take 1 tablet by mouth every 12 (twelve) hours.        . Diphenhydramine-APAP, sleep, (TYLENOL PM EXTRA STRENGTH PO) Take 1 tablet by mouth at bedtime as needed.        . donepezil (ARICEPT) 10 MG tablet Take 1 tablet (10 mg total) by mouth at bedtime as needed.  30 tablet  11  . finasteride (PROSCAR) 5 MG tablet Take 5 mg by mouth daily.        Marland Kitchen lisinopril (PRINIVIL,ZESTRIL) 10 MG tablet Take 1 tablet (10 mg total) by mouth daily.  30 tablet  11  . magnesium gluconate (MAGONATE) 500 MG tablet Take 500 mg by mouth 2 (two) times daily.        . memantine (NAMENDA) 5 MG tablet Take 5 mg by mouth. 5mg  daily for 1st week, 5mg s BID for 2nd week, on 04/23/11 3rd week he will take 10mg s in AM and 5mg s in PM, and 4th week 10mg s BID.       . Multiple Vitamins-Minerals (MULTIVITAMIN WITH MINERALS) tablet Take 1 tablet by mouth daily.        . QUEtiapine (SEROQUEL) 50 MG tablet Take 1 tablet (50 mg total) by mouth at bedtime.  30 tablet  5  . simvastatin (ZOCOR) 40 MG tablet Take 1 tablet (40 mg total) by mouth at bedtime.  30 tablet  5  . warfarin (COUMADIN) 5 MG tablet Take as directed by Anticoagulation clinic   30 tablet  3  . DISCONTD: QUEtiapine (SEROQUEL) 50 MG tablet Take 1 tablet (50 mg total) by mouth at bedtime.  30 tablet  6  . DISCONTD: magnesium oxide (MAG-OX) 400 MG  tablet Take 400 mg by mouth 2 (two) times daily.          No Known Allergies   Current Medications, Allergies, Past Medical History, Past Surgical History, Family  History, and Social History were reviewed in [employer] record.    Review of Systems       See HPI - all other systems neg except as noted... For his part, Ricky [name] is mostly asymptomatic; but as noted he has a mild dementia... (MMSE (08/14/09):  Date= Oct 8th, 2010; oriented x3;  Pres= Obama, VP= ?, prev Pres= ?;  3/3 objects immed & 3/3 after distraction;  serial 7's= 100 93 86 ?;  WORLD backwards= ?;  proverbs= concrete interpret) (MMSE (12/16/09):  Improved> VP= Biden, PrevPres= Bush, Serial 7's= 100, 93, 59no79, 71; WORLD backwards= DLROD)   Objective:   Physical Exam     WD, Overwt, 77 y/o WM in NAD... GENERAL:  Alert, pleasant, & cooperative... HEENT:  [place]/AT, EOM-wnl, PERRLA, EACs-clear, TMs-wnl, NOSE-clear, THROAT-clear & wnl. NECK:  Supple w/ fairROM; no JVD; right carotid scar, norm impulses w/o bruits; no thyromegaly or nodules palpated; no lymphadenopathy. CHEST:  Clear to P & A; without wheezes/ rales/ or rhonchi... HEART:  Median sternotomy scar, regular rhythm; without murmurs/ rubs/ or gallops heard... ABDOMEN:  Soft & nontender; normal bowel sounds; no organomegaly or masses detected. EXT:  mild arthritic changes; no varicose veins/ +venous insuffic/ tr edema. NEURO:  CN's intact;  no focal neuro deficits detected... DERM:  excoriated blisters c/o shingles over the right post scapl & neck> C2 distrib...  RADIOLOGY DATA:  Reviewed in the EPIC EMR & discussed w/ the patient...  LABORATORY DATA:  Reviewed in the EPIC EMR & discussed w/ the patient...   Assessment & Plan:            CAD>  Followed by Dr [name] & stable;  He denies angina, palpit, SOB, dizzy, syncope, edmea, etc...  PACER>  He CHB & PAF;  Followed by [name] & stable, doing well; he remains on Coumadin via the CC...  CEREBROVASC Dis>  On ASA 81mg /d & Coumadin via the CC;  He denies all cerebral ischemic symptoms...  CHOL>  He remains stable on the Simva40; but needs a  better low fat diet & wt reduction...  GI>  Stable w/o new complaints or concerns...  GU>  Followed by DrWrenn & stable on Finasteride + Enablex...  Dementia>  eval by DrDohmeier reviewed... Continue current therapy Aricept, Namenda, Seroquel, Alpraz...  Anemia>  Prev Fe malabsorption prob but Hg stable x yrs & hasn't required any parenteral Fe.Marland KitchenMarland Kitchen

## 2011-09-20 ENCOUNTER — Ambulatory Visit (INDEPENDENT_AMBULATORY_CARE_PROVIDER_SITE_OTHER): Payer: Medicare Other | Admitting: *Deleted

## 2011-09-20 DIAGNOSIS — Z7901 Long term (current) use of anticoagulants: Secondary | ICD-10-CM

## 2011-09-20 DIAGNOSIS — I4891 Unspecified atrial fibrillation: Secondary | ICD-10-CM

## 2011-09-20 DIAGNOSIS — I679 Cerebrovascular disease, unspecified: Secondary | ICD-10-CM

## 2011-09-20 LAB — POCT INR: INR: 2.5

## 2011-10-06 ENCOUNTER — Other Ambulatory Visit: Payer: Self-pay | Admitting: Cardiology

## 2011-10-14 ENCOUNTER — Telehealth: Payer: Self-pay | Admitting: Pulmonary Disease

## 2011-10-14 DIAGNOSIS — E7800 Pure hypercholesterolemia, unspecified: Secondary | ICD-10-CM

## 2011-10-14 DIAGNOSIS — I4891 Unspecified atrial fibrillation: Secondary | ICD-10-CM

## 2011-10-14 DIAGNOSIS — I2510 Atherosclerotic heart disease of native coronary artery without angina pectoris: Secondary | ICD-10-CM

## 2011-10-14 DIAGNOSIS — E785 Hyperlipidemia, unspecified: Secondary | ICD-10-CM

## 2011-10-14 DIAGNOSIS — Z79899 Other long term (current) drug therapy: Secondary | ICD-10-CM

## 2011-10-14 MED ORDER — DARIFENACIN HYDROBROMIDE ER 7.5 MG PO TB24: 7.5000 mg | ORAL_TABLET | Freq: Every day | ORAL | Status: DC

## 2011-10-14 NOTE — Telephone Encounter (Signed)
Called and spoke with pts wife and she is aware of rx that has been sent to the pharamcy

## 2011-10-18 ENCOUNTER — Ambulatory Visit (INDEPENDENT_AMBULATORY_CARE_PROVIDER_SITE_OTHER): Payer: Medicare Other | Admitting: *Deleted

## 2011-10-18 DIAGNOSIS — I4891 Unspecified atrial fibrillation: Secondary | ICD-10-CM

## 2011-10-18 DIAGNOSIS — I679 Cerebrovascular disease, unspecified: Secondary | ICD-10-CM

## 2011-10-18 DIAGNOSIS — Z7901 Long term (current) use of anticoagulants: Secondary | ICD-10-CM

## 2011-11-03 ENCOUNTER — Encounter: Payer: Self-pay | Admitting: Internal Medicine

## 2011-11-03 DIAGNOSIS — I495 Sick sinus syndrome: Secondary | ICD-10-CM

## 2011-11-15 ENCOUNTER — Ambulatory Visit (INDEPENDENT_AMBULATORY_CARE_PROVIDER_SITE_OTHER): Payer: Medicare Other | Admitting: *Deleted

## 2011-11-15 DIAGNOSIS — I679 Cerebrovascular disease, unspecified: Secondary | ICD-10-CM

## 2011-11-15 DIAGNOSIS — Z7901 Long term (current) use of anticoagulants: Secondary | ICD-10-CM

## 2011-11-15 DIAGNOSIS — I4891 Unspecified atrial fibrillation: Secondary | ICD-10-CM

## 2011-11-22 ENCOUNTER — Telehealth: Payer: Self-pay | Admitting: Pulmonary Disease

## 2011-11-22 MED ORDER — SIMVASTATIN 40 MG PO TABS: 40.0000 mg | ORAL_TABLET | Freq: Every day | ORAL | Status: DC

## 2011-11-22 NOTE — Telephone Encounter (Signed)
Spoke with pt's spouse and she states pt needs a refill on simvastatin. Rx was sent to pharm and nothing further needed per spouse.

## 2011-11-24 ENCOUNTER — Other Ambulatory Visit: Payer: Self-pay | Admitting: *Deleted

## 2011-11-24 MED ORDER — SIMVASTATIN 40 MG PO TABS: 40.0000 mg | ORAL_TABLET | Freq: Every day | ORAL | Status: DC

## 2011-11-30 ENCOUNTER — Telehealth: Payer: Self-pay | Admitting: Pulmonary Disease

## 2011-11-30 MED ORDER — ALPRAZOLAM 0.25 MG PO TABS: ORAL_TABLET | ORAL | Status: DC

## 2011-11-30 NOTE — Telephone Encounter (Signed)
I spoke with pt and he states he needs his alprazolam rx sent to [facility] in [place] bc his current rx has expired. I have called this into the pharmacy and pt aware. Nothing further was needed

## 2011-12-13 ENCOUNTER — Ambulatory Visit (INDEPENDENT_AMBULATORY_CARE_PROVIDER_SITE_OTHER): Payer: Medicare Other

## 2011-12-13 DIAGNOSIS — I679 Cerebrovascular disease, unspecified: Secondary | ICD-10-CM

## 2011-12-13 DIAGNOSIS — Z7901 Long term (current) use of anticoagulants: Secondary | ICD-10-CM

## 2011-12-13 DIAGNOSIS — I4891 Unspecified atrial fibrillation: Secondary | ICD-10-CM

## 2011-12-21 ENCOUNTER — Telehealth: Payer: Self-pay | Admitting: Pulmonary Disease

## 2011-12-21 MED ORDER — FESOTERODINE FUMARATE ER 8 MG PO TB24: 8.0000 mg | ORAL_TABLET | Freq: Every day | ORAL | Status: DC

## 2011-12-21 NOTE — Telephone Encounter (Signed)
Spoke with Ricky [name] and notified of recs per SN. She verbalized understanding and rx was sent to pharm. Nothing further needed.

## 2011-12-21 NOTE — Telephone Encounter (Signed)
Per SN---let the pt know that they will not cover the enabelex   Change to toviaz  8mg    1 daily.   This med is just as good.  thanks

## 2011-12-21 NOTE — Telephone Encounter (Signed)
I spoke with the [facility] pharmacy and was advised pt's insurance does not want to cover pt's enablex. She stated we can either initiate a PA at [phone] or pt can be switched to oxybutyn ER, toviaz, or vesicare. Please advise Dr. Kriste Basque, thanks  No Known Allergies

## 2012-01-10 ENCOUNTER — Ambulatory Visit (INDEPENDENT_AMBULATORY_CARE_PROVIDER_SITE_OTHER): Payer: Medicare Other

## 2012-01-10 DIAGNOSIS — I679 Cerebrovascular disease, unspecified: Secondary | ICD-10-CM

## 2012-01-10 DIAGNOSIS — Z7901 Long term (current) use of anticoagulants: Secondary | ICD-10-CM

## 2012-01-10 DIAGNOSIS — I4891 Unspecified atrial fibrillation: Secondary | ICD-10-CM

## 2012-01-18 ENCOUNTER — Other Ambulatory Visit: Payer: Self-pay | Admitting: Cardiology

## 2012-02-02 ENCOUNTER — Encounter: Payer: Self-pay | Admitting: Internal Medicine

## 2012-02-02 DIAGNOSIS — I4891 Unspecified atrial fibrillation: Secondary | ICD-10-CM

## 2012-02-06 ENCOUNTER — Other Ambulatory Visit: Payer: Self-pay | Admitting: Cardiology

## 2012-02-07 ENCOUNTER — Ambulatory Visit (INDEPENDENT_AMBULATORY_CARE_PROVIDER_SITE_OTHER): Payer: Medicare Other | Admitting: *Deleted

## 2012-02-07 DIAGNOSIS — Z7901 Long term (current) use of anticoagulants: Secondary | ICD-10-CM

## 2012-02-07 DIAGNOSIS — I4891 Unspecified atrial fibrillation: Secondary | ICD-10-CM

## 2012-02-07 DIAGNOSIS — I679 Cerebrovascular disease, unspecified: Secondary | ICD-10-CM

## 2012-02-17 ENCOUNTER — Ambulatory Visit (INDEPENDENT_AMBULATORY_CARE_PROVIDER_SITE_OTHER): Payer: Medicare Other | Admitting: *Deleted

## 2012-02-17 DIAGNOSIS — Z7901 Long term (current) use of anticoagulants: Secondary | ICD-10-CM

## 2012-02-17 DIAGNOSIS — I679 Cerebrovascular disease, unspecified: Secondary | ICD-10-CM

## 2012-02-17 DIAGNOSIS — I4891 Unspecified atrial fibrillation: Secondary | ICD-10-CM

## 2012-02-17 LAB — POCT INR: INR: 2.6

## 2012-02-29 ENCOUNTER — Other Ambulatory Visit: Payer: Self-pay | Admitting: *Deleted

## 2012-02-29 MED ORDER — LISINOPRIL 10 MG PO TABS: 10.0000 mg | ORAL_TABLET | Freq: Every day | ORAL | Status: DC

## 2012-03-07 ENCOUNTER — Ambulatory Visit (INDEPENDENT_AMBULATORY_CARE_PROVIDER_SITE_OTHER): Payer: Medicare Other | Admitting: Pulmonary Disease

## 2012-03-07 ENCOUNTER — Encounter: Payer: Self-pay | Admitting: Pulmonary Disease

## 2012-03-07 ENCOUNTER — Ambulatory Visit (INDEPENDENT_AMBULATORY_CARE_PROVIDER_SITE_OTHER): Payer: Medicare Other | Admitting: *Deleted

## 2012-03-07 VITALS — BP 136/88 | HR 69 | Temp 97.1°F | Ht 69.0 in | Wt 214.8 lb

## 2012-03-07 DIAGNOSIS — I442 Atrioventricular block, complete: Secondary | ICD-10-CM

## 2012-03-07 DIAGNOSIS — D649 Anemia, unspecified: Secondary | ICD-10-CM

## 2012-03-07 DIAGNOSIS — M1990 Unspecified osteoarthritis, unspecified site: Secondary | ICD-10-CM

## 2012-03-07 DIAGNOSIS — E7800 Pure hypercholesterolemia, unspecified: Secondary | ICD-10-CM

## 2012-03-07 DIAGNOSIS — I679 Cerebrovascular disease, unspecified: Secondary | ICD-10-CM

## 2012-03-07 DIAGNOSIS — K2990 Gastroduodenitis, unspecified, without bleeding: Secondary | ICD-10-CM

## 2012-03-07 DIAGNOSIS — Z7901 Long term (current) use of anticoagulants: Secondary | ICD-10-CM

## 2012-03-07 DIAGNOSIS — K2970 Gastritis, unspecified, without bleeding: Secondary | ICD-10-CM

## 2012-03-07 DIAGNOSIS — I2510 Atherosclerotic heart disease of native coronary artery without angina pectoris: Secondary | ICD-10-CM

## 2012-03-07 DIAGNOSIS — K5730 Diverticulosis of large intestine without perforation or abscess without bleeding: Secondary | ICD-10-CM

## 2012-03-07 DIAGNOSIS — N401 Enlarged prostate with lower urinary tract symptoms: Secondary | ICD-10-CM

## 2012-03-07 DIAGNOSIS — Z950 Presence of cardiac pacemaker: Secondary | ICD-10-CM

## 2012-03-07 DIAGNOSIS — I4891 Unspecified atrial fibrillation: Secondary | ICD-10-CM

## 2012-03-07 DIAGNOSIS — R413 Other amnesia: Secondary | ICD-10-CM

## 2012-03-07 LAB — POCT INR: INR: 4.6

## 2012-03-07 NOTE — Patient Instructions (Signed)
Today we updated your med list in our EPIC system...    Continue your current medications the same...  Call for any problems...  Let's plan a follow up visit in 6 months w/ fasting blood work at that time.Marland KitchenMarland Kitchen

## 2012-03-11 ENCOUNTER — Encounter: Payer: Self-pay | Admitting: Pulmonary Disease

## 2012-03-11 NOTE — Progress Notes (Signed)
Subjective:    Patient ID: Ricky [name], male    DOB: Mar 06, 1931, 78 y.o.   MRN: [id number]  HPI 78 y/o WM here for a follow up visit... he has multiple medical problems including:  OSA;  CAD- s/p CABG;  Hx AFib/ CHB/ Pacer;  Cerebrovasc dis- s/p RtCAE;  Hyperchol;  HH/ Gastritis/ hx gastric volvulus w/ surg2009;  Divertics/ colon polyps;  BPH w/ boo;  DJD;  Early dementia w/ personality changes;  Hx anemia w/ Fe malaborption- resolved...  ~  February 14, 2011:  59mo ROV & doing satis- he has no real complaints mostly due to his dementia (seen by DrDohmeier 12/11 & agreed w/ Aricept Rx), wife notes that he has some incontinence esp in AM & he is freq "wet" so we discussed trial of Enablex 7.5mg  for OB;  Also not resting at night & wife is still having a rough time w/ his behavior & requesting incr Seroquel to 50mg  Qhs;  He had decr hearing assoc w/ bilat cerumen impactions- removed by Gayla Medicus 2/12 & improved;  Finally he notes runny nose as well & while wife thinks it's not that bad- he wants Rx & we wrote for Astepro Prn use...  ~  September 07, 2011:  59mo ROV     CAD, s/p CABG> on Lisinopril10; BP= 124/80 & he is largely asymptomatic- denies CP, palpit, SOB, edema, etc...    AFib, AVBlock, Pacer> on Coumadin via CC; [name] checked pacer 10/12 & working well, no changes made...    Cerebrovasc dis w/ right CAE 2004> he has a known 60-79% left ICA stenosis & gets CDopplers yearly (stable x several yrs); stable w/o cerebral ischemic symptoms...    Hyperlipid> on Simva40 + diet efforts; FLP shows Chol ok but TG sl elev (see below); reviewed low fat diet recs...    GI> HH, Gastritis, Divertics, Polyps> on OTC Prilosec 20mg  as needed; treated for HPylori 2007; known massive HH s/p lap-reduced HH, repair of the hiatus & gastropexy 2009 by DrMartin...    BPH & OB> on Proscar5 & Enablex7.5; seen by DrWrenn 8/12 & stable, no changes made, f/u 16yr...    DJD> he uses Tylenol for pain as needed; he has no specific  complaints...    Dementia> he has a senile dementia w/ behavioral changes that are really hard on the wife; on Aricept10 & Namenda10Bid; followed by DrDohmeier for Neuro, last seen 6/12 & Namenda added...    Anxiety/ Agitation> on Alprazolam 0.25mg  Prn and Seroquel 50mg  Qhs...   ~  Mar 07, 2012:  59mo ROV & [name] has no complaints; he continues regular appts in the coumadin clinic & well regulated;  [facility] sent a memo stating that his BP was ~180/90 when they checked him but he is 136/88 today & has been well controlled on his Lisinopril 10mg /d, we decided to continue same...  We reviewed his problems, meds, XRays, & Labs>  See prob list below>>   Problem List:    << PROBLEM LIST UPDATED 03/07/12 >>  OBSTRUCTIVE SLEEP APNEA (ICD-327.23) - sleep study 5/03 showed RDI=27 w/ desat to 86% & mod snoring... he refused CPAP Rx, and denies daytime hypersomnolence... apparently snoring doesn't disturb wife & pt rests adeq but wakes daily at 4-5AM, "I sleep better in my recliner in front of the TV"... he does not want further intervention.  ATHEROSCLEROTIC HEART DISEASE (ICD-414.00) & CARDIOMYOPATHY, ISCHEMIC S/P CABG (ICD-414.8) - on LISINOPRIL 10mg /d; followed by [name] Graciela Husbands for Cardiology...  ~  s/p CABG 9/00 and AFlutter ablation w/ pacer in 1998... ~  8/11:  f/u by [name]- stable, no changes made... ~  2DEcho 9/11 showed mild LVH, EF=55% w/ dyssynergy c/w RV pacing, biatrial enlargem, mild RV dil & pulm HTN. ~  He continues f/u w/ [name]- 8/12 & [name]-10/12; CAD, AFib, AVjunctional ablation & pacemaker, on Coumadin; stable at last checks & no changes made...  ATRIAL FIBRILLATION, HX OF (ICD-V12.59), AV BLOCK, COMPLETE (ICD-426.0), & PACEMAKER, PERMANENT DDD STJ (ICD-V45.01) - on COUMADIN and followed in the Coumadin Clinic... he has a pacemaker for complete heart block & continues w/ regular pacer checks per Cards.  CEREBROVASCULAR DISEASE (ICD-437.9) - on ASA 81mg /d... he had a TIA in 2004 w/  carotid eval showing high grade right carotid obstruction... s/p right CAE by DrCDickson 10/04... CDopplers per [facility]... ~  CDoppler 1/09 showed 60-79% left ICA stenosis, & normal right ICA- s/p surg. ~  CDoppler 1/10 showed  patent right ICA with no evidence of restenosis, 60-79% stenosis of the left ICA. ~  CT Brain 10/10 showed atrophy & sm vessel dis, NAD. ~  CDoppler 1/11 showed patent right CAE site with no ICA stenosis, 60%-79% stenosis of the left ICA (no change from 7/10).  HYPERCHOLESTEROLEMIA (ICD-272.0) - on SIMVASTATIN 40mg /d + low chol diet... ~  FLP 7/08 on Lip40 showed TChol 143, TG 173, HDL 40, LDL 69 ~  FLP 1/09 showed TChol 176, TG 155, HDL 42, LDL 103... Lipitor changed to Simva80 for $$$. ~  FLP 7/09 on Simva80 showed TChol 120, TG 150, HDL 42, LDL 48... rec- keep same. ~  FLP 1/10 on Simva80 showed TChol 149, TG 143, HDL 44, LDL 77 ~  9/10: [name] decr his Simva80 to Simva40- wife states "the government cut it back"... ~  FLP 10/10 on Simva40 showed TChol 183, TG 199, HDL 45, LDL 99 ~  FLP 10/11 on Simva40 showed TChol 160, TG 196, HDL 54, LDL 67 ~  FLP 4/12 on Simva40 showed TChol 153, TG 156, HDL 46, LDL 76 ~  FLP 10/12 on Simva40 showed TChol 174, TG 217, HDL 49, LDL 102  GASTRITIS (ICD-535.50) - on OMEPRAZOLE 20mg /d... prev treated for HPylori in 2007...he has a large HH and last EGD 6/08 by Dorris Singh revealed an esoph stricture- dilated... hosp 4/09 w/ part gastric volvulus and most of stomach in chest... EGD showed massive HH, no lesions seen... Aug09 [name] did laparosopically reduced HH w/ repair of the hiatus & gastropexy to keep it reduced (Nissen not done).  DIVERTICULOSIS OF COLON (ICD-562.10) & COLONIC POLYPS (ICD-211.3) - 3mm adenomatous polyp removed from ascending colon in 2001... last colonoscopy by [facility] 4/04 w/ divertics + polyp... he's been eval for subseq rectal bleeding w/ fissure dx'd and Rx w/ botox 7/08 by [name]... he uses PROCTOCORT Cream  Prn...  BENIGN PROSTATIC HYPERTROPHY, WITH OBSTRUCTION (ICD-600.01) - eval by DrPeterson 2010 for obstructive symptoms and hematuria (related to his coumadin)... neg cysto & started on PROSCAR 5mg /d... ~  labs 10/11 showed PSA= 1.00 ~   4/12:  new overactive bladder symptoms & we decided to try Enablex Qhs...  ~  8/12:  He had Urology f/u DrWrenn- doing well on FINASTERIDE 5mg /d & ENABLEX 7.5mg /d; continue same... ~  Labs here 10/12 showed PSA= 0.91  DEGENERATIVE JOINT DISEASE (ICD-715.90)  MEMORY LOSS (ICD-780.93) - 10/10 OV wife voiced concern regarding memory loss & personality change- more argumentative, belligerent etc... MMSE showed changes and we decided to Rx w/ ARICEPT 10mg /d... ~  10/11:  We added SEROQUEL 25mg  Qhs due to increased belligerence & set up Neuro referral... ~  12/11:  He had eval DrDohmeier> note reviewed, she agreed w/ Aricept, consider Namenda; she checked EEG- neg, non focal... ~  6/12:  f/u by Neurology w/ continued dementia & behavioral issues; we increased Seroquel to 50mg  & they rec adding NAMENDA 10mg Bid...  ANXIETY (ICD-300.00) - he's on ALPRAZOLAM 0.25mg  as needed.  Hx of ANEMIA (ICD-285.9) - he has been eval by [name] w/ an iron malabsorption anemia (bone marrow in 2001 w/ absent iron stores)... prev Rx w/ parenteral iron infusions... ~  labs 10/10 by [name] showed Hg= 14.6, Fe= 107, Ferritin= 120... ~  labs here 10/11 showed Hg= 15.2, MCV= 97 > hasn't required IV Fe in over 41yrs & [name] signed off. ~  Labs here 4/12 showed Hg= 14.4 ~  Labs here 10/12 showed Hg= 15.1  SHINGLES (ICD-053.9) - presented 4/11 w/ right C2 shingles> Rx w/ ACYCLOVIR, MEDROL, VICODIN... we discussed Shingles Vaccine as well & they will think about it...  Health Maintenance ~  Immunizations:  had PNEUMOVAX 2007... received 2010 Flu shot 10/8...   Past Surgical History  Procedure Date  . Pacemaker insertion   . Coronary artery bypass graft     x4 by Dr.  Sena Hitch 9/00  . Carotid endarterectomy     right 10/04 Dr. Edilia Bo  . Anal fissure repair   . Hiatal hernia repair     incarcerated stomach with takedown and repair of hiatus and gastropexy    Outpatient Encounter Prescriptions as of 03/07/2012  Medication Sig Dispense Refill  . acetaminophen (TYLENOL) 500 MG tablet Take 500 mg by mouth daily. As needed      . ALPRAZolam (XANAX) 0.25 MG tablet 1/2 tablet to 1 tablet by mouth three times a day as needed for nerves.  90 tablet  5  . dextromethorphan-guaiFENesin (MUCINEX DM) 30-600 MG per 12 hr tablet Take 1 tablet by mouth every 12 (twelve) hours.        . Diphenhydramine-APAP, sleep, (TYLENOL PM EXTRA STRENGTH PO) Take 1 tablet by mouth at bedtime as needed.        . donepezil (ARICEPT) 10 MG tablet Take 1 tablet (10 mg total) by mouth at bedtime as needed.  30 tablet  11  . Fesoterodine Fumarate (TOVIAZ) 8 MG TB24 Take 1 tablet (8 mg total) by mouth daily.  30 tablet  11  . finasteride (PROSCAR) 5 MG tablet Take 5 mg by mouth daily.        Marland Kitchen lisinopril (PRINIVIL,ZESTRIL) 10 MG tablet Take 1 tablet (10 mg total) by mouth daily.  30 tablet  11  . magnesium gluconate (MAGONATE) 500 MG tablet Take 500 mg by mouth 2 (two) times daily.        . memantine (NAMENDA) 10 MG tablet Take 10 mg by mouth 2 (two) times daily.      . Multiple Vitamins-Minerals (MULTIVITAMIN WITH MINERALS) tablet Take 1 tablet by mouth daily.        . QUEtiapine (SEROQUEL) 50 MG tablet Take 1 tablet (50 mg total) by mouth at bedtime.  30 tablet  5  . simvastatin (ZOCOR) 40 MG tablet Take 1 tablet (40 mg total) by mouth at bedtime.  30 tablet  11  . warfarin (COUMADIN) 5 MG tablet TAKE 1 TABLET DAILY AS INSTRUCTED BY ANTICOAGULATION CLINIC  35 tablet  3  . darifenacin (ENABLEX) 7.5 MG 24 hr tablet Take 1 tablet (7.5 mg  total) by mouth at bedtime.  30 tablet  6  . DISCONTD: darifenacin (ENABLEX) 7.5 MG 24 hr tablet Take 1 tablet (7.5 mg total) by mouth daily.  30 tablet  11    . DISCONTD: memantine (NAMENDA) 5 MG tablet Take 5 mg by mouth. 5mg  daily for 1st week, 5mg s BID for 2nd week, on 04/23/11 3rd week he will take 10mg s in AM and 5mg s in PM, and 4th week 10mg s BID.       Marland Kitchen DISCONTD: warfarin (COUMADIN) 5 MG tablet Take as directed by Coumadin  clinic  30 tablet  3    No Known Allergies   Current Medications, Allergies, Past Medical History, Past Surgical History, Family History, and Social History were reviewed in [employer] record.    Review of Systems       See HPI - all other systems neg except as noted... For his part, Koral is mostly asymptomatic; but as noted he has a mild dementia... (MMSE (08/14/09):  Date= Oct 8th, 2010; oriented x3;  Pres= Obama, VP= ?, prev Pres= ?;  3/3 objects immed & 3/3 after distraction;  serial 7's= 100 93 86 ?;  WORLD backwards= ?;  proverbs= concrete interpret) (MMSE (12/16/09):  Improved> VP= Biden, PrevPres= Bush, Serial 7's= 100, 93, 59no79, 71; WORLD backwards= DLROD)   Objective:   Physical Exam     WD, Overwt, 78 y/o WM in NAD... GENERAL:  Alert, pleasant, & cooperative... HEENT:  [place]/AT, EOM-wnl, PERRLA, EACs-clear, TMs-wnl, NOSE-clear, THROAT-clear & wnl. NECK:  Supple w/ fairROM; no JVD; right carotid scar, norm impulses w/o bruits; no thyromegaly or nodules palpated; no lymphadenopathy. CHEST:  Clear to P & A; without wheezes/ rales/ or rhonchi... HEART:  Median sternotomy scar, regular rhythm; without murmurs/ rubs/ or gallops heard... ABDOMEN:  Soft & nontender; normal bowel sounds; no organomegaly or masses detected. EXT:  mild arthritic changes; no varicose veins/ +venous insuffic/ tr edema. NEURO:  CN's intact;  no focal neuro deficits detected... DERM:  excoriated blisters c/o shingles over the right post scapl & neck> C2 distrib...  RADIOLOGY DATA:  Reviewed in the EPIC EMR & discussed w/ the patient...  LABORATORY DATA:  Reviewed in the EPIC EMR & discussed w/ the  patient...   Assessment & Plan:            CAD>  Followed by Dr [name] & stable;  He denies angina, palpit, SOB, dizzy, syncope, edmea, etc...  PACER>  He CHB & PAF;  Followed by [name] & stable, doing well; he remains on Coumadin via the CC...  CEREBROVASC Dis>  On ASA 81mg /d & Coumadin via the CC;  He denies all cerebral ischemic symptoms...  CHOL>  He remains stable on the Simva40; but needs a better low fat diet & wt reduction...  GI>  Stable w/o new complaints or concerns...  GU>  Followed by DrWrenn & stable on Finasteride + Enablex...  Dementia>  eval by DrDohmeier reviewed... Continue current therapy Aricept, Namenda, Seroquel, Alpraz...  Anemia>  Prev Fe malabsorption prob but Hg stable x yrs & hasn't required any parenteral Fe...   Patient's Medications  New Prescriptions   No medications on file  Previous Medications   ACETAMINOPHEN (TYLENOL) 500 MG TABLET    Take 500 mg by mouth daily. As needed   ALPRAZOLAM (XANAX) 0.25 MG TABLET    1/2 tablet to 1 tablet by mouth three times a day as needed for nerves.   DARIFENACIN (ENABLEX) 7.5 MG 24  HR TABLET    Take 1 tablet (7.5 mg total) by mouth at bedtime.   DEXTROMETHORPHAN-GUAIFENESIN (MUCINEX DM) 30-600 MG PER 12 HR TABLET    Take 1 tablet by mouth every 12 (twelve) hours.     DIPHENHYDRAMINE-APAP, SLEEP, (TYLENOL PM EXTRA STRENGTH PO)    Take 1 tablet by mouth at bedtime as needed.     DONEPEZIL (ARICEPT) 10 MG TABLET    Take 1 tablet (10 mg total) by mouth at bedtime as needed.   FESOTERODINE FUMARATE (TOVIAZ) 8 MG TB24    Take 1 tablet (8 mg total) by mouth daily.   FINASTERIDE (PROSCAR) 5 MG TABLET    Take 5 mg by mouth daily.     LISINOPRIL (PRINIVIL,ZESTRIL) 10 MG TABLET    Take 1 tablet (10 mg total) by mouth daily.   MAGNESIUM GLUCONATE (MAGONATE) 500 MG TABLET    Take 500 mg by mouth 2 (two) times daily.     MEMANTINE (NAMENDA) 10 MG TABLET    Take 10 mg by mouth 2 (two) times daily.   MULTIPLE  VITAMINS-MINERALS (MULTIVITAMIN WITH MINERALS) TABLET    Take 1 tablet by mouth daily.     QUETIAPINE (SEROQUEL) 50 MG TABLET    Take 1 tablet (50 mg total) by mouth at bedtime.   SIMVASTATIN (ZOCOR) 40 MG TABLET    Take 1 tablet (40 mg total) by mouth at bedtime.   WARFARIN (COUMADIN) 5 MG TABLET    TAKE 1 TABLET DAILY AS INSTRUCTED BY ANTICOAGULATION CLINIC  Modified Medications   No medications on file  Discontinued Medications   DARIFENACIN (ENABLEX) 7.5 MG 24 HR TABLET    Take 1 tablet (7.5 mg total) by mouth daily.   MEMANTINE (NAMENDA) 5 MG TABLET    Take 5 mg by mouth. 5mg  daily for 1st week, 5mg s BID for 2nd week, on 04/23/11 3rd week he will take 10mg s in AM and 5mg s in PM, and 4th week 10mg s BID.    WARFARIN (COUMADIN) 5 MG TABLET    Take as directed by Coumadin  clinic

## 2012-03-12 ENCOUNTER — Telehealth: Payer: Self-pay | Admitting: Pulmonary Disease

## 2012-03-12 MED ORDER — AZITHROMYCIN 250 MG PO TABS: ORAL_TABLET | ORAL | Status: AC

## 2012-03-12 NOTE — Telephone Encounter (Signed)
Zpack take as directed.  mucinex dm Twice daily  As needed   Fluids and rest  tylenol As needed   Please contact office for sooner follow up if symptoms do not improve or worsen or seek emergency care

## 2012-03-12 NOTE — Telephone Encounter (Signed)
Called and spoke with pts wife about her lab results and she stated that the pt has lots of congestion--with cough.  Not sure what color it is.  This has been going on since Saturday.  Fever today around 99 earlier this am.  pts wife is requesting recs from SN.  She is aware that no openings aval.  SN please advise.  Thanks  No Known Allergies

## 2012-03-12 NOTE — Telephone Encounter (Signed)
Called and spoke with pts wife and she is aware of zpak sent to the pharmacy.  She is aware that the zpak has been sent to the pts pharmacy and is aware that the pt will need to take mucinex dm twice daily as needed, fluids and rest, tylenol as needed and she will call back if pt is not getting better for appt.

## 2012-03-14 ENCOUNTER — Telehealth: Payer: Self-pay | Admitting: Pulmonary Disease

## 2012-03-14 MED ORDER — DONEPEZIL HCL 10 MG PO TABS: 10.0000 mg | ORAL_TABLET | Freq: Every evening | ORAL | Status: DC | PRN

## 2012-03-14 NOTE — Telephone Encounter (Signed)
Ricky [name] notified that refill for Aricept was sent to [facility] in [place].

## 2012-03-16 ENCOUNTER — Telehealth: Payer: Self-pay | Admitting: Pulmonary Disease

## 2012-03-16 MED ORDER — AMOXICILLIN-POT CLAVULANATE 875-125 MG PO TABS: 1.0000 | ORAL_TABLET | Freq: Two times a day (BID) | ORAL | Status: AC

## 2012-03-16 NOTE — Telephone Encounter (Signed)
Called, spoke with pt's wife.  I informed her of below per Dr. Kriste Basque.  She verbalized understanding of this and is aware rx will be sent to [facility].  She will call back pt's symptoms do not improve or worsen.   Dr. Kriste Basque, when trying to send levaquin rx, I received 2 override msgs: 1.  Severe interaction between levaquin and coumadin.  Msg reads:  Hypoprothrombinemic effects of anticoagulants may be increased by quinolones.  Although the literature is conflicting within and between quinolones, bleeding has been reported in a number of cases.  Dosage reduction of the anticoagulant may be required. 2.  Major interaction between levaquin and seroguel of a QT prolonging Agents 2  Pls advise if you would still like to proceed with levaquin.  Thank you.

## 2012-03-16 NOTE — Telephone Encounter (Signed)
Pt's wife called back to get the status.  Please advise.  Ricky [name]

## 2012-03-16 NOTE — Telephone Encounter (Signed)
Called, spoke with pt's spouse who reports pt has a "chest cold."  C/o prod cough with yellow mucus, wheezing, and chest congestion.  Mucus is difficult to get up.  Denies increased SOB and fever.  Symptoms started on Monday.  Finished Z pak yesterday and is taking mucinex dm.  Offered OV today but declined - would like to know if something can be called in.  Dr. Kriste Basque, pls advise.  Thank you.  [facility]  nkda - verified

## 2012-03-16 NOTE — Telephone Encounter (Signed)
May use Augmentin 875mg  Twice daily  #14  No refills Advise all abx can interfere with coumadin , to notify c.c. Of new rx  Please contact office for sooner follow up if symptoms do not improve or worsen or seek emergency care  Take abx with food, start yogurt

## 2012-03-16 NOTE — Telephone Encounter (Signed)
Called spoke with patient's spouse, advised of change in abx and recs to notify cc of the new rx, to take with food and start yogurt to protect the stomach while on this additional medication.  Pt's wife verbalized her understanding and will call if pt's symptoms do not improve or worsen.  rx sent to verified pharmacy.

## 2012-03-16 NOTE — Telephone Encounter (Signed)
Per SN---increase the mucinex 2 po bid with plenty of fluids and change to stronger abx---levaquin 500mg   #7  1 daily until gone.

## 2012-03-21 ENCOUNTER — Ambulatory Visit (INDEPENDENT_AMBULATORY_CARE_PROVIDER_SITE_OTHER): Payer: Medicare Other | Admitting: *Deleted

## 2012-03-21 DIAGNOSIS — Z7901 Long term (current) use of anticoagulants: Secondary | ICD-10-CM

## 2012-03-21 DIAGNOSIS — I4891 Unspecified atrial fibrillation: Secondary | ICD-10-CM

## 2012-03-21 DIAGNOSIS — I679 Cerebrovascular disease, unspecified: Secondary | ICD-10-CM

## 2012-03-21 LAB — POCT INR: INR: 3.8

## 2012-04-05 ENCOUNTER — Ambulatory Visit (INDEPENDENT_AMBULATORY_CARE_PROVIDER_SITE_OTHER): Payer: Medicare Other

## 2012-04-05 ENCOUNTER — Other Ambulatory Visit: Payer: Self-pay | Admitting: Dermatology

## 2012-04-05 DIAGNOSIS — Z7901 Long term (current) use of anticoagulants: Secondary | ICD-10-CM

## 2012-04-05 DIAGNOSIS — I4891 Unspecified atrial fibrillation: Secondary | ICD-10-CM

## 2012-04-05 DIAGNOSIS — I679 Cerebrovascular disease, unspecified: Secondary | ICD-10-CM

## 2012-04-05 LAB — POCT INR: INR: 2.3

## 2012-05-03 ENCOUNTER — Ambulatory Visit (INDEPENDENT_AMBULATORY_CARE_PROVIDER_SITE_OTHER): Payer: Medicare Other | Admitting: *Deleted

## 2012-05-03 DIAGNOSIS — Z7901 Long term (current) use of anticoagulants: Secondary | ICD-10-CM

## 2012-05-03 DIAGNOSIS — I495 Sick sinus syndrome: Secondary | ICD-10-CM

## 2012-05-03 DIAGNOSIS — I4891 Unspecified atrial fibrillation: Secondary | ICD-10-CM

## 2012-05-03 DIAGNOSIS — I679 Cerebrovascular disease, unspecified: Secondary | ICD-10-CM

## 2012-06-01 ENCOUNTER — Ambulatory Visit (INDEPENDENT_AMBULATORY_CARE_PROVIDER_SITE_OTHER): Payer: Medicare Other | Admitting: *Deleted

## 2012-06-01 DIAGNOSIS — I4891 Unspecified atrial fibrillation: Secondary | ICD-10-CM

## 2012-06-01 DIAGNOSIS — I679 Cerebrovascular disease, unspecified: Secondary | ICD-10-CM

## 2012-06-01 DIAGNOSIS — Z7901 Long term (current) use of anticoagulants: Secondary | ICD-10-CM

## 2012-06-22 ENCOUNTER — Ambulatory Visit (INDEPENDENT_AMBULATORY_CARE_PROVIDER_SITE_OTHER): Payer: Medicare Other

## 2012-06-22 DIAGNOSIS — I679 Cerebrovascular disease, unspecified: Secondary | ICD-10-CM

## 2012-06-22 DIAGNOSIS — I4891 Unspecified atrial fibrillation: Secondary | ICD-10-CM

## 2012-06-22 DIAGNOSIS — Z7901 Long term (current) use of anticoagulants: Secondary | ICD-10-CM

## 2012-07-06 ENCOUNTER — Ambulatory Visit (INDEPENDENT_AMBULATORY_CARE_PROVIDER_SITE_OTHER): Payer: Medicare Other | Admitting: Urology

## 2012-07-06 DIAGNOSIS — N401 Enlarged prostate with lower urinary tract symptoms: Secondary | ICD-10-CM

## 2012-07-06 DIAGNOSIS — N3941 Urge incontinence: Secondary | ICD-10-CM

## 2012-07-11 ENCOUNTER — Encounter: Payer: Self-pay | Admitting: Cardiology

## 2012-07-11 ENCOUNTER — Ambulatory Visit (INDEPENDENT_AMBULATORY_CARE_PROVIDER_SITE_OTHER): Payer: Medicare Other | Admitting: Cardiology

## 2012-07-11 ENCOUNTER — Ambulatory Visit (INDEPENDENT_AMBULATORY_CARE_PROVIDER_SITE_OTHER): Payer: Medicare Other | Admitting: *Deleted

## 2012-07-11 VITALS — BP 130/75 | HR 75 | Ht 69.0 in | Wt 211.0 lb

## 2012-07-11 DIAGNOSIS — I2510 Atherosclerotic heart disease of native coronary artery without angina pectoris: Secondary | ICD-10-CM

## 2012-07-11 DIAGNOSIS — Z7901 Long term (current) use of anticoagulants: Secondary | ICD-10-CM

## 2012-07-11 DIAGNOSIS — I4891 Unspecified atrial fibrillation: Secondary | ICD-10-CM

## 2012-07-11 DIAGNOSIS — I679 Cerebrovascular disease, unspecified: Secondary | ICD-10-CM

## 2012-07-11 DIAGNOSIS — R413 Other amnesia: Secondary | ICD-10-CM

## 2012-07-11 DIAGNOSIS — E7800 Pure hypercholesterolemia, unspecified: Secondary | ICD-10-CM

## 2012-07-11 MED ORDER — WARFARIN SODIUM 5 MG PO TABS: ORAL_TABLET | ORAL | Status: DC

## 2012-07-11 NOTE — Assessment & Plan Note (Signed)
Needs lipid and liver profile. 

## 2012-07-11 NOTE — Assessment & Plan Note (Signed)
Stable on meds  

## 2012-07-11 NOTE — Assessment & Plan Note (Signed)
No recurrent symptoms.

## 2012-07-11 NOTE — Assessment & Plan Note (Signed)
Remains on warfarin.  Stable with rate.

## 2012-07-11 NOTE — Patient Instructions (Addendum)
Your physician wants you to follow-up in: MARCH 2014 with Dr [name]. You will receive a reminder letter in the mail two months in advance. If you don't receive a letter, please call our office to schedule the follow-up appointment.  Your physician recommends that you schedule a follow-up appointment in: October with Dr Graciela Husbands (recall in system)  Your physician recommends that you continue on your current medications as directed. Please refer to the Current Medication list given to you today.  Your physician recommends that you return for a FASTING LIPID, LIVER and BMP--nothing to eat or drink after midnight, lab opens at 8:30

## 2012-07-11 NOTE — Progress Notes (Signed)
HPI:  The patient is in for followup. From a clinical standpoint, he is stable. His memory is about the same. He's not had any major problems that we can tell. He is on limited medical regimen, but tolerates this well. He does have some joint aches from time to time.  Current Outpatient Prescriptions  Medication Sig Dispense Refill  . acetaminophen (TYLENOL) 500 MG tablet Take 500 mg by mouth daily. As needed      . ALPRAZolam (XANAX) 0.25 MG tablet 1/2 tablet to 1 tablet by mouth three times a day as needed for nerves.  90 tablet  5  . dextromethorphan-guaiFENesin (MUCINEX DM) 30-600 MG per 12 hr tablet Take 1 tablet by mouth every 12 (twelve) hours.        Marland Kitchen donepezil (ARICEPT) 10 MG tablet Take 1 tablet (10 mg total) by mouth at bedtime as needed.  30 tablet  11  . fesoterodine (TOVIAZ) 4 MG TB24 Take 4 mg by mouth daily.      . finasteride (PROSCAR) 5 MG tablet Take 5 mg by mouth daily.        Marland Kitchen lisinopril (PRINIVIL,ZESTRIL) 10 MG tablet Take 1 tablet (10 mg total) by mouth daily.  30 tablet  11  . magnesium gluconate (MAGONATE) 500 MG tablet Take 500 mg by mouth 2 (two) times daily.        . memantine (NAMENDA) 10 MG tablet Take 10 mg by mouth 2 (two) times daily.      . Multiple Vitamins-Minerals (MULTIVITAMIN WITH MINERALS) tablet Take 1 tablet by mouth daily.        . QUEtiapine (SEROQUEL) 50 MG tablet Take 1 tablet (50 mg total) by mouth at bedtime.  30 tablet  5  . simvastatin (ZOCOR) 40 MG tablet Take 1 tablet (40 mg total) by mouth at bedtime.  30 tablet  11  . warfarin (COUMADIN) 5 MG tablet TAKE 1 TABLET DAILY AS INSTRUCTED BY ANTICOAGULATION CLINIC  35 tablet  3  . darifenacin (ENABLEX) 7.5 MG 24 hr tablet Take 1 tablet (7.5 mg total) by mouth at bedtime.  30 tablet  6  . DISCONTD: Fesoterodine Fumarate (TOVIAZ) 8 MG TB24 Take 1 tablet (8 mg total) by mouth daily.  30 tablet  11    No Known Allergies  Past Medical History  Diagnosis Date  . Obstructive sleep apnea   .  Atherosclerotic heart disease   . Ischemic cardiomyopathy   . Atrial fibrillation   . AV block, complete   . Pacemaker     permanent DDD STJ  . Cerebrovascular disease   . Hypercholesterolemia   . Hiatal hernia   . Gastritis   . Diverticulosis   . Colonic polyp   . BPH (benign prostatic hypertrophy)   . DJD (degenerative joint disease)   . Memory loss   . Anxiety   . Anemia   . Shingles     Past Surgical History  Procedure Date  . Pacemaker insertion   . Coronary artery bypass graft     x4 by Dr. Sena Hitch 9/00  . Carotid endarterectomy     right 10/04 Dr. Edilia Bo  . Anal fissure repair   . Hiatal hernia repair     incarcerated stomach with takedown and repair of hiatus and gastropexy    Family History  Problem Relation Age of Onset  . Coronary artery disease Other     History   Social History  . Marital Status: Married    Spouse  Name: arrie    Number of Children: 1  . Years of Education: N/A   Occupational History  . retired   .     Social History Main Topics  . Smoking status: Former Smoker -- 1.5 packs/day for 50 years    Types: Cigarettes    Quit date: 11/08/1995  . Smokeless tobacco: Never Used  . Alcohol Use: No     quit drinking back in 23-Apr-1976  . Drug Use: No  . Sexually Active: Not on file   Other Topics Concern  . Not on file   Social History Narrative   Married, wife Milinda Pointer, 54yrs1 daughter from 1st marriage who lives nearbySon died 2010-04-23 in tractor accident.    ROS: Please see the HPI.  All other systems reviewed and negative.  PHYSICAL EXAM:  BP 130/75  Pulse 75  Ht 5\' 9"  (1.753 m)  Wt 211 lb (95.709 kg)  BMI 31.16 kg/m2  General: Well developed, well nourished, in no acute distress. Head:  Normocephalic and atraumatic. Neck: no JVD Lungs: Clear to auscultation and percussion. Heart: Normal S1 and S2.  No murmur, rubs or gallops.  Abdomen:  Normal bowel sounds; soft; non tender; no organomegaly Pulses: Pulses normal in all 4  extremities. Extremities: No clubbing or cyanosis. No edema. Neurologic: Alert and oriented x 3.  EKG:  Atrial fib.  VVI pacing.    ASSESSMENT AND PLAN:

## 2012-07-16 ENCOUNTER — Telehealth: Payer: Self-pay | Admitting: Pulmonary Disease

## 2012-07-16 MED ORDER — ALPRAZOLAM 0.25 MG PO TABS: ORAL_TABLET | ORAL | Status: DC

## 2012-07-16 NOTE — Telephone Encounter (Signed)
Ok to refill per SN.  This has been called to the [facility]  And she is aware. Nothing further is needed.

## 2012-07-16 NOTE — Telephone Encounter (Signed)
Pt needs refill on his alprazolam 0.25mg  last refilled 11/30/11 #90 x 5 refills. Last OV 03/07/12 pending OV 09/07/12. Please advise if okay to refill Dr. Kriste Basque thanks

## 2012-08-02 DIAGNOSIS — I495 Sick sinus syndrome: Secondary | ICD-10-CM

## 2012-08-09 ENCOUNTER — Ambulatory Visit (INDEPENDENT_AMBULATORY_CARE_PROVIDER_SITE_OTHER): Payer: Medicare Other | Admitting: *Deleted

## 2012-08-09 DIAGNOSIS — Z7901 Long term (current) use of anticoagulants: Secondary | ICD-10-CM

## 2012-08-09 DIAGNOSIS — I679 Cerebrovascular disease, unspecified: Secondary | ICD-10-CM

## 2012-08-09 DIAGNOSIS — I4891 Unspecified atrial fibrillation: Secondary | ICD-10-CM

## 2012-08-09 LAB — POCT INR: INR: 2.6

## 2012-08-31 ENCOUNTER — Encounter: Payer: Self-pay | Admitting: Internal Medicine

## 2012-08-31 ENCOUNTER — Ambulatory Visit (INDEPENDENT_AMBULATORY_CARE_PROVIDER_SITE_OTHER): Payer: Medicare Other | Admitting: Internal Medicine

## 2012-08-31 ENCOUNTER — Other Ambulatory Visit (INDEPENDENT_AMBULATORY_CARE_PROVIDER_SITE_OTHER): Payer: Medicare Other

## 2012-08-31 ENCOUNTER — Ambulatory Visit (INDEPENDENT_AMBULATORY_CARE_PROVIDER_SITE_OTHER): Payer: Medicare Other | Admitting: *Deleted

## 2012-08-31 VITALS — BP 186/90 | HR 72 | Ht 71.0 in | Wt 213.0 lb

## 2012-08-31 DIAGNOSIS — I679 Cerebrovascular disease, unspecified: Secondary | ICD-10-CM

## 2012-08-31 DIAGNOSIS — I4891 Unspecified atrial fibrillation: Secondary | ICD-10-CM

## 2012-08-31 DIAGNOSIS — Z7901 Long term (current) use of anticoagulants: Secondary | ICD-10-CM

## 2012-08-31 DIAGNOSIS — E785 Hyperlipidemia, unspecified: Secondary | ICD-10-CM

## 2012-08-31 DIAGNOSIS — I442 Atrioventricular block, complete: Secondary | ICD-10-CM

## 2012-08-31 DIAGNOSIS — I10 Essential (primary) hypertension: Secondary | ICD-10-CM

## 2012-08-31 DIAGNOSIS — I2510 Atherosclerotic heart disease of native coronary artery without angina pectoris: Secondary | ICD-10-CM

## 2012-08-31 DIAGNOSIS — Z950 Presence of cardiac pacemaker: Secondary | ICD-10-CM

## 2012-08-31 LAB — PACEMAKER DEVICE OBSERVATION
AL AMPLITUDE: 1.1 mv
AL IMPEDENCE PM: 585 Ohm
BATTERY VOLTAGE: 2.76 V
RV LEAD THRESHOLD: 0.625 V

## 2012-08-31 LAB — BASIC METABOLIC PANEL
CO2: 29 mEq/L (ref 19–32)
Calcium: 9.2 mg/dL (ref 8.4–10.5)
Creatinine, Ser: 1 mg/dL (ref 0.4–1.5)
Glucose, Bld: 103 mg/dL — ABNORMAL HIGH (ref 70–99)

## 2012-08-31 LAB — LIPID PANEL
Total CHOL/HDL Ratio: 4
Triglycerides: 186 mg/dL — ABNORMAL HIGH (ref 0.0–149.0)

## 2012-08-31 LAB — HEPATIC FUNCTION PANEL
AST: 22 U/L (ref 0–37)
Albumin: 3.6 g/dL (ref 3.5–5.2)
Alkaline Phosphatase: 73 U/L (ref 39–117)

## 2012-08-31 NOTE — Patient Instructions (Signed)
Your physician wants you to follow-up in: ONE YEAR WITH DR KLEIN You will receive a reminder letter in the mail two months in advance. If you don't receive a letter, please call our office to schedule the follow-up appointment.    

## 2012-08-31 NOTE — Progress Notes (Signed)
Patient Care Team: Michele Mcalpine, MD as PCP - General   HPI  Ricky [name] is a 78 y.o. male seen in followup for coronary artery disease. He status post CABG. He also has atrial arrhythmias and permanent atrial fibrillation. He is status post AV junction ablation and pacemaker implantation.   The patient denies chest pain, shortness of breath, nocturnal dyspnea, orthopnea or peripheral edema. There have been no palpitations, lightheadedness or syncope.    Memory is concern  Past Medical History  Diagnosis Date  . Obstructive sleep apnea   . Ischemic cardiomyopathy   . Atrial fibrillation   . AV block, complete s/p ablation   . Pacemaker [place]crombie   . Cerebrovascular disease   . Hypercholesterolemia   . Hiatal hernia   . Gastritis   . Diverticulosis   . Colonic polyp   . BPH (benign prostatic hypertrophy)   . DJD (degenerative joint disease)   . Memory loss   . Anxiety   . Anemia   . Shingles     Past Surgical History  Procedure Date  . Pacemaker insertion   . Coronary artery bypass graft     x4 by Dr. Sena Hitch 9/00  . Carotid endarterectomy     right 10/04 Dr. Edilia Bo  . Anal fissure repair   . Hiatal hernia repair     incarcerated stomach with takedown and repair of hiatus and gastropexy    Current Outpatient Prescriptions  Medication Sig Dispense Refill  . acetaminophen (TYLENOL) 500 MG tablet Take 500 mg by mouth daily. As needed      . ALPRAZolam (XANAX) 0.25 MG tablet 1/2 tablet to 1 tablet by mouth three times a day as needed for nerves.  90 tablet  5  . dextromethorphan-guaiFENesin (MUCINEX DM) 30-600 MG per 12 hr tablet Take 1 tablet by mouth every 12 (twelve) hours.        Marland Kitchen donepezil (ARICEPT) 10 MG tablet Take 1 tablet (10 mg total) by mouth at bedtime as needed.  30 tablet  11  . fesoterodine (TOVIAZ) 4 MG TB24 Take 4 mg by mouth daily.      . finasteride (PROSCAR) 5 MG tablet Take 5 mg by mouth daily.        Marland Kitchen lisinopril (PRINIVIL,ZESTRIL) 10 MG  tablet Take 1 tablet (10 mg total) by mouth daily.  30 tablet  11  . memantine (NAMENDA) 10 MG tablet Take 10 mg by mouth 2 (two) times daily.      . Multiple Vitamins-Minerals (MULTIVITAMIN WITH MINERALS) tablet Take 1 tablet by mouth daily.        . QUEtiapine (SEROQUEL) 50 MG tablet Take 50 mg by mouth 2 (two) times daily.      . simvastatin (ZOCOR) 40 MG tablet Take 1 tablet (40 mg total) by mouth at bedtime.  30 tablet  11  . warfarin (COUMADIN) 5 MG tablet Take as directed by Anticoagulation Clinic  35 tablet  3  . DISCONTD: QUEtiapine (SEROQUEL) 50 MG tablet Take 1 tablet (50 mg total) by mouth at bedtime.  30 tablet  5    No Known Allergies  Review of Systems negative except from HPI and PMH  Physical Exam BP 186/90  Pulse 72  Ht 5\' 11"  (1.803 m)  Wt 213 lb (96.616 kg)  BMI 29.71 kg/m2 Well developed and well nourished in no acute distress HENT normal E scleral and icterus clear Neck Supple JVP flat; carotids brisk and full Clear to ausculation  *  Regular rate and rhythm, no murmurs gallops or rub Soft with active bowel sounds No clubbing cyanosis none Edema Alert and oriented, grossly normal motor and sensory function Skin Warm and Dry    Assessment and  Plan

## 2012-08-31 NOTE — Assessment & Plan Note (Signed)
Stable on current meds 

## 2012-08-31 NOTE — Assessment & Plan Note (Signed)
Stable post pacing 

## 2012-08-31 NOTE — Assessment & Plan Note (Signed)
Permanent. On anticoagulation 

## 2012-08-31 NOTE — Assessment & Plan Note (Signed)
The patient's device was interrogated.  The information was reviewed. No changes were made in the programming.    

## 2012-08-31 NOTE — Assessment & Plan Note (Signed)
Blood pressure is poorly controlled. I've asked him to get an outpatient recording. We will be seeing her PCP next Friday. Augmented blood pressure control may be appropriate

## 2012-09-07 ENCOUNTER — Ambulatory Visit (INDEPENDENT_AMBULATORY_CARE_PROVIDER_SITE_OTHER): Payer: Medicare Other | Admitting: Pulmonary Disease

## 2012-09-07 ENCOUNTER — Encounter: Payer: Self-pay | Admitting: Pulmonary Disease

## 2012-09-07 ENCOUNTER — Encounter: Payer: Self-pay | Admitting: *Deleted

## 2012-09-07 VITALS — BP 126/70 | HR 69 | Temp 98.6°F | Ht 69.0 in | Wt 216.0 lb

## 2012-09-07 DIAGNOSIS — I442 Atrioventricular block, complete: Secondary | ICD-10-CM

## 2012-09-07 DIAGNOSIS — K2970 Gastritis, unspecified, without bleeding: Secondary | ICD-10-CM

## 2012-09-07 DIAGNOSIS — I679 Cerebrovascular disease, unspecified: Secondary | ICD-10-CM

## 2012-09-07 DIAGNOSIS — K5730 Diverticulosis of large intestine without perforation or abscess without bleeding: Secondary | ICD-10-CM

## 2012-09-07 DIAGNOSIS — K2990 Gastroduodenitis, unspecified, without bleeding: Secondary | ICD-10-CM

## 2012-09-07 DIAGNOSIS — F411 Generalized anxiety disorder: Secondary | ICD-10-CM

## 2012-09-07 DIAGNOSIS — N401 Enlarged prostate with lower urinary tract symptoms: Secondary | ICD-10-CM

## 2012-09-07 DIAGNOSIS — I4891 Unspecified atrial fibrillation: Secondary | ICD-10-CM

## 2012-09-07 DIAGNOSIS — I2510 Atherosclerotic heart disease of native coronary artery without angina pectoris: Secondary | ICD-10-CM

## 2012-09-07 DIAGNOSIS — E7800 Pure hypercholesterolemia, unspecified: Secondary | ICD-10-CM

## 2012-09-07 DIAGNOSIS — R413 Other amnesia: Secondary | ICD-10-CM

## 2012-09-07 DIAGNOSIS — Z950 Presence of cardiac pacemaker: Secondary | ICD-10-CM

## 2012-09-07 DIAGNOSIS — M1990 Unspecified osteoarthritis, unspecified site: Secondary | ICD-10-CM

## 2012-09-07 NOTE — Patient Instructions (Addendum)
Today we updated your med list in our EPIC system...    Continue your current medications the same...    We decided to keep your same meds for now...  Today we gave you the 2013 Flu vaccine...  Remember to cut back on calories & eliminate salt from your diet...  Call for any questions....  Let's plan a follow up visit in 6 months w/ FASTING blood work at that time.Marland KitchenMarland Kitchen

## 2012-09-07 NOTE — Progress Notes (Signed)
Subjective:    Patient ID: Ricky [name], male    DOB: 06/03/1931, 78 y.o.   MRN: [id number]  HPI 78 y/o WM here for a follow up visit... he has multiple medical problems including:  OSA;  CAD- s/p CABG;  Hx AFib/ CHB/ Pacer;  Cerebrovasc dis- s/p RtCAE;  Hyperchol;  HH/ Gastritis/ hx gastric volvulus w/ surg2009;  Divertics/ colon polyps;  BPH w/ boo;  DJD;  Early dementia w/ personality changes;  Hx anemia w/ Fe malaborption- resolved...  ~  February 14, 2011:  77mo ROV & doing satis- he has no real complaints mostly due to his dementia (seen by Ricky [name] 12/11 & agreed w/ Ricky [name] Rx), wife notes that he has some incontinence esp in AM & he is freq "wet" so we discussed trial of Ricky [name] 7.5mg  for OB;  Also not resting at night & wife is still having a rough time w/ his behavior & requesting incr Ricky [name] to 50mg  Qhs;  He had decr hearing assoc w/ bilat cerumen impactions- removed by Ricky [name] 2/12 & improved;  Finally he notes runny nose as well & while wife thinks it's not that bad- he wants Rx & we wrote for Astepro Prn use...  ~  September 07, 2011:  77mo ROV     CAD, s/p CABG> on Lisinopril10; BP= 124/80 & he is largely asymptomatic- denies CP, palpit, SOB, edema, etc...    AFib, AVBlock, Pacer> on Coumadin via CC; Ricky [name] checked pacer 10/12 & working well, no changes made...    Cerebrovasc dis w/ right CAE 2004> he has a known 60-79% left ICA stenosis & gets CDopplers yearly (stable x several yrs); stable w/o cerebral ischemic symptoms...    Hyperlipid> on Simva40 + diet efforts; FLP shows Chol ok but TG sl elev (see below); reviewed low fat diet recs...    GI> HH, Gastritis, Divertics, Polyps> on OTC Prilosec 20mg  as needed; treated for HPylori 2007; known massive HH s/p lap-reduced HH, repair of the hiatus & gastropexy 2009 by Ricky [name]...    BPH & OB> on Proscar5 & Enablex7.5; seen by Ricky [name] 8/12 & stable, no changes made, f/u 1yr...    DJD> he uses Tylenol for pain as needed; he has no specific  complaints...    Dementia> he has a senile dementia w/ behavioral changes that are really hard on the wife; on Aricept10 & Namenda10Bid; followed by Ricky [name] for Neuro, last seen 6/12 & Namenda added...    Anxiety/ Agitation> on Ricky [name] 0.25mg  Prn and Ricky [name] 50mg  Qhs...   ~  Mar 07, 2012:  77mo ROV & Ricky [name] has no complaints; he continues regular appts in the coumadin clinic & well regulated;  Ricky [name] sent a memo stating that his BP was ~180/90 when they checked him but he is 136/88 today & has been well controlled on his Lisinopril 10mg /d, we decided to continue same...  We reviewed his problems, meds, XRays, & Labs>  See prob list below>>  ~  September 07, 2012:  77mo ROV & his home BP checks have been up & down therefore reminded to take meds every day, no salt, get wt down... We reviewed the following medical problems during today's office visit>>     OSA> he refused CPAP & he denies sleeping problem, says he wakes refreshed, denies daytime sleepiness (but he's demented)...    ASHD, Cardiomyopathy> on Lisin10; followed by Ricky [name] for Cards & seen 9/13- stable, no changes made...    AFib, Heart block, Pacer> on Coumadin; followed by Ricky [name]  for EP & seen 10/13- pacer function normally, no changes made...    Cerebrovasc dis> he has memory loss/ dementia; denies cerebral ischemic symptoms...    Chol> on Simva40; FLP 10/13 showed TChol 177, TG 186, HDL 44, LDL 96    GI- Gastritis, Divertics> on Omep20; denies abd pain, n/v, c/d, blood seen; last colonoscopy in 2004 w/ polyp removed...    BPH> on Toviaz4, Proscar5; followed by Ricky [name] & seen 8/13- stable BPH, BOO, OAB...    DJD> on Tylenol & OTC meds as needed;     Dementia> on Aricept10, Namenda10Bid, Seroquel50Bid; wife continues to c/o his behavior- eg accused wife of stealing money; they have f/u w/ Neuro soon...     Anxiety> on Xanax0.25 prn use... We reviewed prob list, meds, xrays and labs> see below for updates >> OK Flu shot today... LABS  10/13:  FLP- at goals on Simva40 x TG=186;  Chems- wnl;  CBC- wnl...         Problem List:      OBSTRUCTIVE SLEEP APNEA (ICD-327.23) - sleep study 5/03 showed RDI=27 w/ desat to 86% & mod snoring... he refused CPAP Rx, and denies daytime hypersomnolence... apparently snoring doesn't disturb wife & pt rests adeq but wakes daily at 4-5AM, "I sleep better in my recliner in front of the TV"... he does not want further intervention. ~  CXR 8/09 showed stable heart size, left subclav pacer, clear lungs, largeHH w/ A/F levels...  ATHEROSCLEROTIC HEART DISEASE (ICD-414.00) & CARDIOMYOPATHY, ISCHEMIC S/P CABG (ICD-414.8) - on LISINOPRIL 10mg /d; followed by Ricky [name] Graciela Husbands for Cardiology...  ~  s/p CABG 9/00 and AFlutter ablation w/ pacer in 1998... ~  8/11:  f/u by Ricky [name]- stable, no changes made... ~  2DEcho 9/11 showed mild LVH, EF=55% w/ dyssynergy c/w RV pacing, biatrial enlargem, mild RV dil & pulm HTN. ~  He continues f/u w/ Ricky [name]- 8/12 & Ricky [name]-10/12; CAD, AFib, AVjunctional ablation & pacemaker, on Coumadin; stable at last checks & no changes made... ~  He saw Ricky [name] 9/13> stable, no change in meds, he had him ret for fasting labs 10/13 as noted...  ATRIAL FIBRILLATION, HX OF (ICD-V12.59), AV BLOCK, COMPLETE (ICD-426.0), & PACEMAKER, PERMANENT DDD STJ (ICD-V45.01) - on COUMADIN and followed in the Coumadin Clinic... he has a pacemaker for complete heart block & continues w/ regular pacer checks per Cards. ~  EKG 9/13 showed a ventric paced rhythm... ~  10/13: he had f/u Ricky [name]> pacer checked, doing well, no changes made to the programming...   CEREBROVASCULAR DISEASE (ICD-437.9) - on ASA 81mg /d... he had a TIA in 2004 w/ carotid eval showing high grade right carotid obstruction... s/p right CAE by Ricky [name] 10/04... CDopplers per [facility]... ~  CDoppler 1/09 showed 60-79% left ICA stenosis, & normal right ICA- s/p surg. ~  CDoppler 1/10 showed  patent right ICA with no evidence of  restenosis, 60-79% stenosis of the left ICA. ~  CT Brain 10/10 showed atrophy & sm vessel dis, NAD. ~  CDoppler 1/11 showed patent right CAE site with no ICA stenosis, 60%-79% stenosis of the left ICA (no change from 7/10).  HYPERCHOLESTEROLEMIA (ICD-272.0) - on SIMVASTATIN 40mg /d + low chol diet... ~  FLP 7/08 on Lip40 showed TChol 143, TG 173, HDL 40, LDL 69 ~  FLP 1/09 showed TChol 176, TG 155, HDL 42, LDL 103... Lipitor changed to Simva80 for $$$. ~  FLP 7/09 on Simva80 showed TChol 120, TG 150, HDL 42, LDL 48... rec- keep same. ~  FLP 1/10 on Simva80 showed TChol 149, TG 143, HDL 44, LDL 77 ~  9/10: Ricky [name] decr his Simva80 to Simva40- wife states "the government cut it back"... ~  FLP 10/10 on Simva40 showed TChol 183, TG 199, HDL 45, LDL 99 ~  FLP 10/11 on Simva40 showed TChol 160, TG 196, HDL 54, LDL 67 ~  FLP 4/12 on Simva40 showed TChol 153, TG 156, HDL 46, LDL 76 ~  FLP 10/12 on Simva40 showed TChol 174, TG 217, HDL 49, LDL 102 ~  FLP 10/13 on Simva40 showed TChol 177, TG 186, HDL 44, LDL 96  GASTRITIS (ICD-535.50) - on OMEPRAZOLE 20mg /d... prev treated for HPylori in 2007...he has a large HH and last EGD 6/08 by Dorris Singh revealed an esoph stricture- dilated... hosp 4/09 w/ part gastric volvulus and most of stomach in chest... EGD showed massive HH, no lesions seen... Aug09 [name] did laparosopically reduced HH w/ repair of the hiatus & gastropexy to keep it reduced (Nissen not done).  DIVERTICULOSIS OF COLON (ICD-562.10) & COLONIC POLYPS (ICD-211.3) - 3mm adenomatous polyp removed from ascending colon in 2001... last colonoscopy by [facility] 4/04 w/ divertics + polyp... he's been eval for subseq rectal bleeding w/ fissure dx'd and Rx w/ botox 7/08 by [name]... he uses PROCTOCORT Cream Prn...  BENIGN PROSTATIC HYPERTROPHY, WITH OBSTRUCTION (ICD-600.01) - eval by DrPeterson 2010 for obstructive symptoms and hematuria (related to his coumadin)... neg cysto & started on PROSCAR  5mg /d... ~  labs 10/11 showed PSA= 1.00 ~   4/12:  new overactive bladder symptoms & we decided to try Ricky [name] Qhs...  ~  8/12:  He had Urology f/u Ricky [name]- doing well on FINASTERIDE 5mg /d & ENABLEX7.5=> changed to TOVIAZ 8mg /d... ~  Labs here 10/12 showed PSA= 0.91 ~  Urology f/u 8/13 by Ricky [name]> BPH, BOO, OAB; they decr his Toviaz to 4mg /d...  DEGENERATIVE JOINT DISEASE (ICD-715.90)  MEMORY LOSS (ICD-780.93) - 10/10 OV wife voiced concern regarding memory loss & personality change- more argumentative, belligerent etc... MMSE showed changes and we decided to Rx w/ Ricky [name] 10mg /d... ~  CT Head 10/10 showed mild atrophy & advanced chr ischemic changes, ventriculomegaly & vol loss, NAD... ~  10/11:  We added Ricky [name] 25mg  Qhs due to increased belligerence & set up Neuro referral... ~  12/11:  He had eval Ricky [name]> note reviewed, she agreed w/ Ricky [name], consider Namenda; she checked EEG- neg, non focal... ~  6/12:  f/u by Neurology w/ continued dementia & behavioral issues; we increased Ricky [name] to 50mg  & they rec adding NAMENDA 10mg Bid...  ANXIETY (ICD-300.00) - he's on Ricky [name] 0.25mg  as needed.  Hx of ANEMIA (ICD-285.9) - he has been eval by [name] w/ an iron malabsorption anemia (bone marrow in 2001 w/ absent iron stores)... prev Rx w/ parenteral iron infusions... ~  labs 10/10 by [name] showed Hg= 14.6, Fe= 107, Ferritin= 120... ~  labs here 10/11 showed Hg= 15.2, MCV= 97 > hasn't required IV Fe in over 77yrs & [name] signed off. ~  Labs here 4/12 showed Hg= 14.4 ~  Labs here 10/12 showed Hg= 15.1  SHINGLES (ICD-053.9) - presented 4/11 w/ right C2 shingles> Rx w/ ACYCLOVIR, MEDROL, VICODIN... we discussed Shingles Vaccine as well & they will think about it...  Health Maintenance ~  Immunizations:  had PNEUMOVAX 2007... received 2010 Flu shot 10/8...   Past Surgical History  Procedure Date  . Pacemaker insertion   . Coronary artery bypass graft     x4 by Dr.  Sena Hitch 9/00  .  Carotid endarterectomy     right 10/04 Dr. Edilia Bo  . Anal fissure repair   . Hiatal hernia repair     incarcerated stomach with takedown and repair of hiatus and gastropexy    Outpatient Encounter Prescriptions as of 09/07/2012  Medication Sig Dispense Refill  . acetaminophen (TYLENOL) 500 MG tablet Take 500 mg by mouth daily. As needed      . Ricky [name] (XANAX) 0.25 MG tablet 1/2 tablet to 1 tablet by mouth three times a day as needed for nerves.  90 tablet  5  . dextromethorphan-guaiFENesin (MUCINEX DM) 30-600 MG per 12 hr tablet Take 1 tablet by mouth every 12 (twelve) hours.        Marland Kitchen donepezil (Ricky [name]) 10 MG tablet Take 1 tablet (10 mg total) by mouth at bedtime as needed.  30 tablet  11  . fesoterodine (TOVIAZ) 4 MG TB24 Take 4 mg by mouth daily.      . finasteride (PROSCAR) 5 MG tablet Take 5 mg by mouth daily.        Marland Kitchen lisinopril (PRINIVIL,ZESTRIL) 10 MG tablet Take 1 tablet (10 mg total) by mouth daily.  30 tablet  11  . memantine (NAMENDA) 10 MG tablet Take 10 mg by mouth 2 (two) times daily.      . Multiple Vitamins-Minerals (MULTIVITAMIN WITH MINERALS) tablet Take 1 tablet by mouth daily.        . simvastatin (ZOCOR) 40 MG tablet Take 1 tablet (40 mg total) by mouth at bedtime.  30 tablet  11  . warfarin (COUMADIN) 5 MG tablet Take as directed by Anticoagulation Clinic  35 tablet  3  . QUEtiapine (Ricky [name]) 50 MG tablet Take 50 mg by mouth 2 (two) times daily.        No Known Allergies   Current Medications, Allergies, Past Medical History, Past Surgical History, Family History, and Social History were reviewed in [employer] record.    Review of Systems       See HPI - all other systems neg except as noted... For his part, Ricky [name] is mostly asymptomatic; but as noted he has a mild dementia... (MMSE (08/14/09):  Date= Oct 8th, 2010; oriented x3;  Pres= Obama, VP= ?, prev Pres= ?;  3/3 objects immed & 3/3 after distraction;  serial  7's= 100 93 86 ?;  WORLD backwards= ?;  proverbs= concrete interpret) (MMSE (12/16/09):  Improved> VP= Biden, PrevPres= Bush, Serial 7's= 100, 93, 59no79, 71; WORLD backwards= DLROD)   Objective:   Physical Exam     WD, Overwt, 78 y/o WM in NAD... GENERAL:  Alert, pleasant, & cooperative... HEENT:  [place]/AT, EOM-wnl, PERRLA, EACs-clear, TMs-wnl, NOSE-clear, THROAT-clear & wnl. NECK:  Supple w/ fairROM; no JVD; right carotid scar, norm impulses w/o bruits; no thyromegaly or nodules palpated; no lymphadenopathy. CHEST:  Clear to P & A; without wheezes/ rales/ or rhonchi... HEART:  Median sternotomy scar, regular rhythm; without murmurs/ rubs/ or gallops heard... ABDOMEN:  Soft & nontender; normal bowel sounds; no organomegaly or masses detected. EXT:  mild arthritic changes; no varicose veins/ +venous insuffic/ tr edema. NEURO:  CN's intact;  no focal neuro deficits detected... DERM:  excoriated blisters c/o shingles over the right post scapl & neck> C2 distrib...  RADIOLOGY DATA:  Reviewed in the EPIC EMR & discussed w/ the patient...  LABORATORY DATA:  Reviewed in the EPIC EMR & discussed w/ the patient...   Assessment & Plan:  CAD>  Followed by Dr [name] & stable;  He denies angina, palpit, SOB, dizzy, syncope, edema, etc...  PACER>  He has CHB, pacer, & PAF;  Followed by Ricky [name] & stable, doing well; he remains on Coumadin via the CC...  CEREBROVASC Dis>  On ASA 81mg /d & Coumadin via the CC;  He denies all cerebral ischemic symptoms...  CHOL>  He remains stable on the Simva40; but needs a better low fat diet & wt reduction...  GI>  Stable w/o new complaints or concerns...  GU>  Followed by Ricky [name] & stable on Finasteride + Toviaz...  Dementia>  eval by Ricky [name] reviewed... Continue current therapy Ricky [name], Namenda, Ricky [name], Alpraz...  Anemia>  Prev Fe malabsorption prob but Hg stable x yrs & hasn't required any parenteral Fe...   Patient's Medications    New Prescriptions   No medications on file  Previous Medications   ACETAMINOPHEN (TYLENOL) 500 MG TABLET    Take 500 mg by mouth daily. As needed   Ricky [name] (XANAX) 0.25 MG TABLET    1/2 tablet to 1 tablet by mouth three times a day as needed for nerves.   DEXTROMETHORPHAN-GUAIFENESIN (MUCINEX DM) 30-600 MG PER 12 HR TABLET    Take 1 tablet by mouth every 12 (twelve) hours.     DONEPEZIL (Ricky [name]) 10 MG TABLET    Take 1 tablet (10 mg total) by mouth at bedtime as needed.   FESOTERODINE (TOVIAZ) 4 MG TB24    Take 4 mg by mouth daily.   FINASTERIDE (PROSCAR) 5 MG TABLET    Take 5 mg by mouth daily.     LISINOPRIL (PRINIVIL,ZESTRIL) 10 MG TABLET    Take 1 tablet (10 mg total) by mouth daily.   MEMANTINE (NAMENDA) 10 MG TABLET    Take 10 mg by mouth 2 (two) times daily.   MULTIPLE VITAMINS-MINERALS (MULTIVITAMIN WITH MINERALS) TABLET    Take 1 tablet by mouth daily.     QUETIAPINE (Ricky [name]) 50 MG TABLET    Take 50 mg by mouth 2 (two) times daily.   SIMVASTATIN (ZOCOR) 40 MG TABLET    Take 1 tablet (40 mg total) by mouth at bedtime.   WARFARIN (COUMADIN) 5 MG TABLET    Take as directed by Anticoagulation Clinic  Modified Medications   No medications on file  Discontinued Medications   No medications on file

## 2012-09-27 ENCOUNTER — Encounter: Payer: Self-pay | Admitting: Vascular Surgery

## 2012-09-28 ENCOUNTER — Ambulatory Visit (INDEPENDENT_AMBULATORY_CARE_PROVIDER_SITE_OTHER): Payer: Medicare Other

## 2012-09-28 DIAGNOSIS — Z7901 Long term (current) use of anticoagulants: Secondary | ICD-10-CM

## 2012-09-28 DIAGNOSIS — I679 Cerebrovascular disease, unspecified: Secondary | ICD-10-CM

## 2012-09-28 DIAGNOSIS — I4891 Unspecified atrial fibrillation: Secondary | ICD-10-CM

## 2012-09-28 LAB — POCT INR: INR: 2.6

## 2012-10-26 ENCOUNTER — Ambulatory Visit (INDEPENDENT_AMBULATORY_CARE_PROVIDER_SITE_OTHER): Payer: Medicare Other | Admitting: *Deleted

## 2012-10-26 DIAGNOSIS — Z7901 Long term (current) use of anticoagulants: Secondary | ICD-10-CM

## 2012-10-26 DIAGNOSIS — I4891 Unspecified atrial fibrillation: Secondary | ICD-10-CM

## 2012-10-26 DIAGNOSIS — I679 Cerebrovascular disease, unspecified: Secondary | ICD-10-CM

## 2012-11-01 DIAGNOSIS — I442 Atrioventricular block, complete: Secondary | ICD-10-CM

## 2012-11-12 ENCOUNTER — Other Ambulatory Visit: Payer: Self-pay | Admitting: *Deleted

## 2012-11-12 DIAGNOSIS — Z48812 Encounter for surgical aftercare following surgery on the circulatory system: Secondary | ICD-10-CM

## 2012-11-12 DIAGNOSIS — I6529 Occlusion and stenosis of unspecified carotid artery: Secondary | ICD-10-CM

## 2012-11-20 ENCOUNTER — Encounter: Payer: Self-pay | Admitting: Neurosurgery

## 2012-11-21 ENCOUNTER — Ambulatory Visit (INDEPENDENT_AMBULATORY_CARE_PROVIDER_SITE_OTHER): Payer: Medicare Other | Admitting: Vascular Surgery

## 2012-11-21 ENCOUNTER — Encounter: Payer: Self-pay | Admitting: Neurosurgery

## 2012-11-21 ENCOUNTER — Other Ambulatory Visit: Payer: Self-pay | Admitting: *Deleted

## 2012-11-21 ENCOUNTER — Ambulatory Visit (INDEPENDENT_AMBULATORY_CARE_PROVIDER_SITE_OTHER): Payer: Medicare Other | Admitting: Neurosurgery

## 2012-11-21 VITALS — BP 156/85 | HR 69 | Resp 16 | Ht 72.0 in | Wt 216.0 lb

## 2012-11-21 DIAGNOSIS — Z48812 Encounter for surgical aftercare following surgery on the circulatory system: Secondary | ICD-10-CM

## 2012-11-21 DIAGNOSIS — I6529 Occlusion and stenosis of unspecified carotid artery: Secondary | ICD-10-CM

## 2012-11-21 NOTE — Progress Notes (Signed)
Carotid duplex performed @ [facility] 11/21/2012 

## 2012-11-21 NOTE — Progress Notes (Signed)
[facility] [place] Carotid Office Note  CC: Carotid surveillance Referring Physician: Edilia Bo  History of Present Illness: 79 year old male patient of Dr. Edilia Bo status post right CEA in October 2004. The patient denies any signs or symptoms of CVA, TIA, amaurosis fugax or any neural deficit. The patient denies any new medical diagnoses or recent surgery.  Past Medical History  Diagnosis Date  . Obstructive sleep apnea   . Ischemic cardiomyopathy   . Atrial fibrillation   . AV block, complete s/p ablation   . Pacemaker [place]   . Cerebrovascular disease   . Hypercholesterolemia   . Hiatal hernia   . Gastritis   . Diverticulosis   . Colonic polyp   . BPH (benign prostatic hypertrophy)   . DJD (degenerative joint disease)   . Memory loss   . Anxiety   . Anemia   . Shingles   . Carotid artery occlusion     ROS: [x]  Positive   [ ]  Denies    General: [ ]  Weight loss, [ ]  Fever, [ ]  chills Neurologic: [ ]  Dizziness, [ ]  Blackouts, [ ]  Seizure [ ]  Stroke, [ ]  "Mini stroke", [ ]  Slurred speech, [ ]  Temporary blindness; [ ]  weakness in arms or legs, [ ]  Hoarseness Cardiac: [ ]  Chest pain/pressure, [ ]  Shortness of breath at rest [ ]  Shortness of breath with exertion, [ ]  Atrial fibrillation or irregular heartbeat Vascular: [ ]  Pain in legs with walking, [ ]  Pain in legs at rest, [ ]  Pain in legs at night,  [ ]  Non-healing ulcer, [ ]  Blood clot in vein/DVT,   Pulmonary: [ ]  Home oxygen, [ ]  Productive cough, [ ]  Coughing up blood, [ ]  Asthma,  [ ]  Wheezing Musculoskeletal:  [ ]  Arthritis, [ ]  Low back pain, [ ]  Joint pain Hematologic: [ ]  Easy Bruising, [ ]  Anemia; [ ]  Hepatitis Gastrointestinal: [ ]  Blood in stool, [ ]  Gastroesophageal Reflux/heartburn, [ ]  Trouble swallowing Urinary: [ ]  chronic Kidney disease, [ ]  on HD - [ ]  MWF or [ ]  TTHS, [ ]  Burning with urination, [ ]  Difficulty urinating Skin: [ ]  Rashes, [ ]  Wounds Psychological: [ ]  Anxiety, [  ] Depression   Social History History  Substance Use Topics  . Smoking status: Former Smoker -- 1.5 packs/day for 50 years    Types: Cigarettes    Quit date: 11/08/1995  . Smokeless tobacco: Never Used  . Alcohol Use: No     Comment: quit drinking back in 1977    Family History Family History  Problem Relation Age of Onset  . Coronary artery disease Other     No Known Allergies  Current Outpatient Prescriptions  Medication Sig Dispense Refill  . acetaminophen (TYLENOL) 500 MG tablet Take 500 mg by mouth daily. As needed      . ALPRAZolam (XANAX) 0.25 MG tablet 1/2 tablet to 1 tablet by mouth three times a day as needed for nerves.  90 tablet  5  . dextromethorphan-guaiFENesin (MUCINEX DM) 30-600 MG per 12 hr tablet Take 1 tablet by mouth every 12 (twelve) hours.        Marland Kitchen donepezil (ARICEPT) 10 MG tablet Take 1 tablet (10 mg total) by mouth at bedtime as needed.  30 tablet  11  . fesoterodine (TOVIAZ) 4 MG TB24 Take 4 mg by mouth daily.      . finasteride (PROSCAR) 5 MG tablet Take 5 mg by mouth daily.        Marland Kitchen  lisinopril (PRINIVIL,ZESTRIL) 10 MG tablet Take 1 tablet (10 mg total) by mouth daily.  30 tablet  11  . memantine (NAMENDA) 10 MG tablet Take 10 mg by mouth 2 (two) times daily.      . QUEtiapine (SEROQUEL) 50 MG tablet Take 50 mg by mouth 2 (two) times daily.      . simvastatin (ZOCOR) 40 MG tablet Take 1 tablet (40 mg total) by mouth at bedtime.  30 tablet  11  . warfarin (COUMADIN) 5 MG tablet Take as directed by Anticoagulation Clinic  35 tablet  3  . Multiple Vitamins-Minerals (MULTIVITAMIN WITH MINERALS) tablet Take 1 tablet by mouth daily.          Physical Examination  There were no vitals filed for this visit.  There is no height or weight on file to calculate BMI.  General:  WDWN in NAD Gait: Normal HEENT: WNL Eyes: Pupils equal Pulmonary: normal non-labored breathing , without Rales, rhonchi,  wheezing Cardiac: RRR, without  Murmurs, rubs or  gallops; Abdomen: soft, NT, no masses Skin: no rashes, ulcers noted  Vascular Exam Pulses: 3+ radial pulses bilaterally Carotid bruits: Carotid pulses heard on the right with a mild bruit heard on the left Extremities without ischemic changes, no Gangrene , no cellulitis; no open wounds;  Musculoskeletal: no muscle wasting or atrophy   Neurologic: A&O X 3; Appropriate Affect ; SENSATION: normal; MOTOR FUNCTION:  moving all extremities equally. Speech is fluent/normal  Non-Invasive Vascular Imaging CAROTID DUPLEX 11/21/2012  Right ICA 20 - 39 % stenosis Left ICA 60 - 79 % stenosis   ASSESSMENT/PLAN: Asymptomatic patient with unchanged carotid duplex from January 2012. The patient will followup in 6 months with repeat carotid duplex. The patient's questions were encouraged and answered, he is in agreement with this plan.  Lauree Chandler ANP   Clinic MD: Edilia Bo

## 2012-11-22 ENCOUNTER — Ambulatory Visit (INDEPENDENT_AMBULATORY_CARE_PROVIDER_SITE_OTHER): Payer: Medicare Other | Admitting: Pharmacist

## 2012-11-22 DIAGNOSIS — I679 Cerebrovascular disease, unspecified: Secondary | ICD-10-CM

## 2012-11-22 DIAGNOSIS — Z7901 Long term (current) use of anticoagulants: Secondary | ICD-10-CM

## 2012-11-22 DIAGNOSIS — I4891 Unspecified atrial fibrillation: Secondary | ICD-10-CM

## 2012-11-22 LAB — POCT INR: INR: 2.6

## 2012-11-27 ENCOUNTER — Other Ambulatory Visit: Payer: Self-pay | Admitting: Pulmonary Disease

## 2012-11-27 MED ORDER — SIMVASTATIN 40 MG PO TABS: 40.0000 mg | ORAL_TABLET | Freq: Every day | ORAL | Status: DC

## 2012-11-27 NOTE — Telephone Encounter (Signed)
Refill for the simvastatin 40 has been sent to the pharmacy per pts request

## 2012-12-14 ENCOUNTER — Telehealth: Payer: Self-pay | Admitting: Pulmonary Disease

## 2012-12-14 NOTE — Telephone Encounter (Signed)
Called and spoke with pts wife and she stated that the pt got up this morning and his penis was very red.  She stated that he is not having any pain or swelling.  They are aware that if any swelling or bleeding or he has problems urinating that he will need to be seen in the ER for this.  They both voiced their understanding of this.  appt with SN on 2-13 at 2:30.  Nothing further is needed.

## 2012-12-19 ENCOUNTER — Telehealth: Payer: Self-pay | Admitting: Pulmonary Disease

## 2012-12-19 NOTE — Telephone Encounter (Signed)
Spoke with patients wife, aware that appt has been rescheduled for 12/27/12 at 2pm. Per SN if need anything btw now and then he needs to go to the ER.  Pt aware that our office is closed tomorrow and will reopen on Friday. Keep [place] posted on patient progress.  Expressed understanding, will call if anything further needed.

## 2012-12-19 NOTE — Telephone Encounter (Signed)
Pt was being seen for: Called and spoke with pts wife and she stated that the pt got up this morning and his penis was very red. She stated that he is not having any pain or swelling. They are aware that if any swelling or bleeding or he has problems urinating that he will need to be seen in the ER for this. They both voiced their understanding of this. appt with SN on 2-13 at 2:30. Nothing further is needed.   Please advise if pt can be worked in sooner. thanks

## 2012-12-19 NOTE — Telephone Encounter (Signed)
Pt will need to go to the ER is needed.   thanks

## 2012-12-20 ENCOUNTER — Ambulatory Visit: Payer: Medicare Other | Admitting: Pulmonary Disease

## 2012-12-27 ENCOUNTER — Ambulatory Visit: Payer: Medicare Other | Admitting: Pulmonary Disease

## 2012-12-28 ENCOUNTER — Encounter: Payer: Self-pay | Admitting: Pulmonary Disease

## 2012-12-28 ENCOUNTER — Ambulatory Visit (INDEPENDENT_AMBULATORY_CARE_PROVIDER_SITE_OTHER): Payer: Medicare Other | Admitting: Pulmonary Disease

## 2012-12-28 VITALS — BP 142/68 | HR 69 | Temp 99.0°F | Ht 69.0 in | Wt 218.2 lb

## 2012-12-28 DIAGNOSIS — R413 Other amnesia: Secondary | ICD-10-CM

## 2012-12-28 DIAGNOSIS — Z950 Presence of cardiac pacemaker: Secondary | ICD-10-CM

## 2012-12-28 DIAGNOSIS — I6529 Occlusion and stenosis of unspecified carotid artery: Secondary | ICD-10-CM

## 2012-12-28 DIAGNOSIS — N138 Other obstructive and reflux uropathy: Secondary | ICD-10-CM

## 2012-12-28 DIAGNOSIS — I442 Atrioventricular block, complete: Secondary | ICD-10-CM

## 2012-12-28 DIAGNOSIS — E7800 Pure hypercholesterolemia, unspecified: Secondary | ICD-10-CM

## 2012-12-28 DIAGNOSIS — I4891 Unspecified atrial fibrillation: Secondary | ICD-10-CM

## 2012-12-28 DIAGNOSIS — F411 Generalized anxiety disorder: Secondary | ICD-10-CM

## 2012-12-28 DIAGNOSIS — I10 Essential (primary) hypertension: Secondary | ICD-10-CM

## 2012-12-28 DIAGNOSIS — M1990 Unspecified osteoarthritis, unspecified site: Secondary | ICD-10-CM

## 2012-12-28 DIAGNOSIS — B356 Tinea cruris: Secondary | ICD-10-CM

## 2012-12-28 DIAGNOSIS — I2510 Atherosclerotic heart disease of native coronary artery without angina pectoris: Secondary | ICD-10-CM

## 2012-12-28 MED ORDER — LISINOPRIL 20 MG PO TABS: 20.0000 mg | ORAL_TABLET | Freq: Every day | ORAL | Status: DC

## 2012-12-28 MED ORDER — CLOTRIMAZOLE-BETAMETHASONE 1-0.05 % EX CREA: TOPICAL_CREAM | CUTANEOUS | Status: DC

## 2012-12-28 NOTE — Patient Instructions (Addendum)
Today we updated your med list in our EPIC system...    We decided to increase your LISINOPRIL BP & heart med to 20mg /d...       You may take 2 of the 10mg  tabs til gone...       We wrote a new prescription for the 20mg  tabs...  We also gave you a cream to apply under the foreskin of the penis for the rash...    Start applying it twice daily, then you can decrease it to once daily when improved...  Call for any questions...  Let's keep the May appt for follow up.Marland KitchenMarland Kitchen

## 2012-12-30 ENCOUNTER — Encounter: Payer: Self-pay | Admitting: Pulmonary Disease

## 2012-12-30 NOTE — Progress Notes (Signed)
Subjective:    Patient ID: Ricky [name], male    DOB: Jul 05, 1931, 79 y.o.   MRN: [id number]  HPI 79 y/o WM here for a follow up visit... he has multiple medical problems including:  OSA;  CAD- s/p CABG;  Hx AFib/ CHB/ Pacer;  Cerebrovasc dis- s/p RtCAE;  Hyperchol;  HH/ Gastritis/ hx gastric volvulus w/ surg2009;  Divertics/ colon polyps;  BPH w/ boo;  DJD;  Early dementia w/ personality changes;  Hx anemia w/ Fe malaborption- resolved...  ~  September 07, 2011:  269mo ROV     CAD, s/p CABG> on Lisinopril10; BP= 124/80 & he is largely asymptomatic- denies CP, palpit, SOB, edema, etc...    AFib, AVBlock, Pacer> on Coumadin via CC; [name] checked pacer 10/12 & working well, no changes made...    Cerebrovasc dis w/ right CAE 2004> he has a known 60-79% left ICA stenosis & gets CDopplers yearly (stable x several yrs); stable w/o cerebral ischemic symptoms...    Hyperlipid> on Simva40 + diet efforts; FLP shows Chol ok but TG sl elev (see below); reviewed low fat diet recs...    GI> HH, Gastritis, Divertics, Polyps> on OTC Prilosec 20mg  as needed; treated for HPylori 2007; known massive HH s/p lap-reduced HH, repair of the hiatus & gastropexy 2009 by DrMartin...    BPH & OB> on Proscar5 & Enablex7.5; seen by DrWrenn 8/12 & stable, no changes made, f/u 80yr...    DJD> he uses Tylenol for pain as needed; he has no specific complaints...    Dementia> he has a senile dementia w/ behavioral changes that are really hard on the wife; on Aricept10 & Namenda10Bid; followed by DrDohmeier for Neuro, last seen 6/12 & Namenda added...    Anxiety/ Agitation> on Alprazolam 0.25mg  Prn and Seroquel 50mg  Qhs...   ~  Mar 07, 2012:  269mo ROV & Ricky [name] has no complaints; he continues regular appts in the coumadin clinic & well regulated;  [facility] sent a memo stating that his BP was ~180/90 when they checked him but he is 136/88 today & has been well controlled on his Lisinopril 10mg /d, we decided to continue same...  We reviewed his  problems, meds, XRays, & Labs>  See prob list below>>  ~  September 07, 2012:  269mo ROV & his home BP checks have been up & down therefore reminded to take meds every day, no salt, get wt down... We reviewed the following medical problems during today's office visit>>     OSA> he refused CPAP & he denies sleeping problem, says he wakes refreshed, denies daytime sleepiness (but he's demented)...    ASHD, Cardiomyopathy> on Lisin10; followed by [name] for Cards & seen 9/13- stable, no changes made...    AFib, Heart block, Pacer> on Coumadin; followed by [name] for EP & seen 10/13- pacer function normally, no changes made...    Cerebrovasc dis> he has memory loss/ dementia; denies cerebral ischemic symptoms...    Chol> on Simva40; FLP 10/13 showed TChol 177, TG 186, HDL 44, LDL 96    GI- Gastritis, Divertics> on Omep20; denies abd pain, n/v, c/d, blood seen; last colonoscopy in 2004 w/ polyp removed...    BPH> on Toviaz4, Proscar5; followed by DrWrenn & seen 8/13- stable BPH, BOO, OAB...    DJD> on Tylenol & OTC meds as needed;     Dementia> on Aricept10, Namenda10Bid, Seroquel50Bid; wife continues to c/o his behavior- eg accused wife of stealing money; they have f/u w/ Neuro soon.Marland KitchenMarland Kitchen  Anxiety> on Xanax0.25 prn use... We reviewed prob list, meds, xrays and labs> see below for updates >> OK Flu shot today; he had TDAP in 2008; Pneumovax in 2007... LABS 10/13:  FLP- at goals on Simva40 x TG=186;  Chems- wnl;  CBC- wnl...  ~  December 28, 2012:  3-73mo ROV & add-on appt due to 2 problems:  1) they have noted that his BP at home has been elev in the 160-170/90 range; he recently had an eye exam & the eye doc was "really concerned" according to the wife; pt denies HA, visual changes, CP, palpit, dizzy, syncope, SOB, edema- he remains asymptomatic; today's BP= 142/68 on Lisinopril10 & we discussed incr to 20mg /d, continue to monitor BP at home...  2) he has noted a lesion on his penis- exam shows sm are of  irritation under the foreskin, no evid of phimosis, no exophytic lesions or ulcerated sores, no adenopathy; we decided to treat w/ Lotirsone cream & we will refer to Urology if it is unresolved... Other problems as noted- reviewed & stable...          Problem List:      OBSTRUCTIVE SLEEP APNEA (ICD-327.23) - sleep study 5/03 showed RDI=27 w/ desat to 86% & mod snoring... he refused CPAP Rx, and denies daytime hypersomnolence... apparently snoring doesn't disturb wife & pt rests adeq but wakes daily at 4-5AM, "I sleep better in my recliner in front of the TV"... he does not want further intervention. ~  CXR 8/09 showed stable heart size, left subclav pacer, clear lungs, largeHH w/ A/F levels...  HYPERTENSION >> ATHEROSCLEROTIC HEART DISEASE (ICD-414.00) >> CARDIOMYOPATHY, ISCHEMIC S/P CABG (ICD-414.8) >> on LISINOPRIL 10mg /d; followed by [name] Ricky [name] for Cardiology...  ~  s/p CABG 9/00 and AFlutter ablation w/ pacer in 1998... ~  8/11:  f/u by [name]- stable, no changes made... ~  2DEcho 9/11 showed mild LVH, EF=55% w/ dyssynergy c/w RV pacing, biatrial enlargem, mild RV dil & pulm HTN. ~  He continues f/u w/ [name]- 8/12 & [name]-10/12; CAD, AFib, AVjunctional ablation & pacemaker, on Coumadin; stable at last checks & no changes made... ~  He saw [name] 9/13> stable, no change in meds, he had him ret for fasting labs 10/13 as noted; EKG= pacer rhythm... ~  2/14: BP at home has been elev in the 160-170/90 range; recent eye exam & "doc was really concerned" according to the wife; pt denies HA, visual changes, CP, palpit, dizzy, syncope, SOB, edema- he remains asymptomatic; today's BP= 142/68 on Lisinopril10 & we discussed incr to 20mg /d, continue to monitor BP at home.  ATRIAL FIBRILLATION, HX OF (ICD-V12.59), AV BLOCK, COMPLETE (ICD-426.0), & PACEMAKER, PERMANENT DDD STJ (ICD-V45.01) - on COUMADIN and followed in the Coumadin Clinic... he has a pacemaker for complete heart block &  continues w/ regular pacer checks per Cards. ~  EKG 9/13 showed a ventric paced rhythm... ~  10/13: he had f/u [name]> pacer checked, doing well, no changes made to the programming...   CEREBROVASCULAR DISEASE (ICD-437.9) - on ASA 81mg /d... he had a TIA in 2004 w/ carotid eval showing high grade right carotid obstruction... s/p right CAE by DrCDickson 10/04... CDopplers per [facility]... ~  CDoppler 1/09 showed 60-79% left ICA stenosis, & normal right ICA- s/p surg. ~  CDoppler 1/10 showed  patent right ICA with no evidence of restenosis, 60-79% stenosis of the left ICA. ~  CT Brain 10/10 showed atrophy & sm vessel dis, NAD. ~  CDoppler 1/11 showed  patent right CAE site with no ICA stenosis, 60%-79% stenosis of the left ICA (no change from 7/10). ~  CDoppler 1/12 by [facility] showed patent right CAE, min hyperplasia w/o hemodynamic changes, 60-79% left ICA stenosis, right ECA stenosis, vertebrals antegrade. ~  CDoppler 1/14 by [facility] are identical to 1/12- no change... They rec f/u in 37mo...  HYPERCHOLESTEROLEMIA (ICD-272.0) - on SIMVASTATIN 40mg /d + low chol diet... ~  FLP 7/08 on Lip40 showed TChol 143, TG 173, HDL 40, LDL 69 ~  FLP 1/09 showed TChol 176, TG 155, HDL 42, LDL 103... Lipitor changed to Simva80 for $$$. ~  FLP 7/09 on Simva80 showed TChol 120, TG 150, HDL 42, LDL 48... rec- keep same. ~  FLP 1/10 on Simva80 showed TChol 149, TG 143, HDL 44, LDL 77 ~  9/10: [name] decr his Simva80 to Simva40- wife states "the government cut it back"... ~  FLP 10/10 on Simva40 showed TChol 183, TG 199, HDL 45, LDL 99 ~  FLP 10/11 on Simva40 showed TChol 160, TG 196, HDL 54, LDL 67 ~  FLP 4/12 on Simva40 showed TChol 153, TG 156, HDL 46, LDL 76 ~  FLP 10/12 on Simva40 showed TChol 174, TG 217, HDL 49, LDL 102 ~  FLP 10/13 on Simva40 showed TChol 177, TG 186, HDL 44, LDL 96  GASTRITIS (ICD-535.50) - on OMEPRAZOLE 20mg /d... prev treated for HPylori in 2007...he has a large HH and last EGD 6/08 by Dorris Singh  revealed an esoph stricture- dilated... hosp 4/09 w/ part gastric volvulus and most of stomach in chest... EGD showed massive HH, no lesions seen... Aug09 [name] did laparosopically reduced HH w/ repair of the hiatus & gastropexy to keep it reduced (Nissen not done).  DIVERTICULOSIS OF COLON (ICD-562.10) & COLONIC POLYPS (ICD-211.3) - 3mm adenomatous polyp removed from ascending colon in 2001... last colonoscopy by [facility] 4/04 w/ divertics + polyp... he's been eval for subseq rectal bleeding w/ fissure dx'd and Rx w/ botox 7/08 by [name]... he uses PROCTOCORT Cream Prn...  BENIGN PROSTATIC HYPERTROPHY, WITH OBSTRUCTION (ICD-600.01) - eval by DrPeterson 2010 for obstructive symptoms and hematuria (related to his coumadin)... neg cysto & started on PROSCAR 5mg /d... ~  labs 10/11 showed PSA= 1.00 ~   4/12:  new overactive bladder symptoms & we decided to try Enablex Qhs...  ~  8/12:  He had Urology f/u DrWrenn- doing well on FINASTERIDE 5mg /d & ENABLEX7.5=> changed to TOVIAZ 8mg /d... ~  Labs here 10/12 showed PSA= 0.91 ~  Urology f/u 8/13 by DrWrenn> BPH, BOO, OAB; they decr his Toviaz to 4mg /d... ~  2/14: presents w/ ?tinea corporis lesion on penis=> rx w/ Lotrisone cream & if unresolved we will refer to Urology...  DEGENERATIVE JOINT DISEASE (ICD-715.90)  MEMORY LOSS (ICD-780.93) - 10/10 OV wife voiced concern regarding memory loss & personality change- more argumentative, belligerent etc... MMSE showed changes and we decided to Rx w/ ARICEPT 10mg /d... ~  CT Head 10/10 showed mild atrophy & advanced chr ischemic changes, ventriculomegaly & vol loss, NAD... ~  10/11:  We added SEROQUEL 25mg  Qhs due to increased belligerence & set up Neuro referral... ~  12/11:  He had eval DrDohmeier> note reviewed, she agreed w/ Aricept, consider Namenda; she checked EEG- neg, non focal... ~  6/12:  f/u by Neurology w/ continued dementia & behavioral issues; we increased Seroquel to 50mg  & they rec adding  NAMENDA 10mg Bid...  ANXIETY (ICD-300.00) - he's on ALPRAZOLAM 0.25mg  as needed.  Hx of ANEMIA (ICD-285.9) - he  has been eval by [name] w/ an iron malabsorption anemia (bone marrow in 2001 w/ absent iron stores)... prev Rx w/ parenteral iron infusions... ~  labs 10/10 by [name] showed Hg= 14.6, Fe= 107, Ferritin= 120... ~  labs here 10/11 showed Hg= 15.2, MCV= 97 > hasn't required IV Fe in over 23yrs & [name] signed off. ~  Labs here 4/12 showed Hg= 14.4 ~  Labs here 10/12 showed Hg= 15.1  SHINGLES (ICD-053.9) - presented 4/11 w/ right C2 shingles> Rx w/ ACYCLOVIR, MEDROL, VICODIN... we discussed Shingles Vaccine as well & they will think about it...  Health Maintenance ~  Immunizations:  had PNEUMOVAX 2007... received 2010 Flu shot 10/8...   Past Surgical History  Procedure Laterality Date  . Pacemaker insertion    . Coronary artery bypass graft      x4 by Dr. Sena Hitch 9/00  . Carotid endarterectomy      right 10/04 Dr. Edilia Bo  . Anal fissure repair    . Hiatal hernia repair      incarcerated stomach with takedown and repair of hiatus and gastropexy    Outpatient Encounter Prescriptions as of 12/28/2012  Medication Sig Dispense Refill  . acetaminophen (TYLENOL) 500 MG tablet Take 500 mg by mouth daily. As needed      . ALPRAZolam (XANAX) 0.25 MG tablet 1/2 tablet to 1 tablet by mouth three times a day as needed for nerves.  90 tablet  5  . dextromethorphan-guaiFENesin (MUCINEX DM) 30-600 MG per 12 hr tablet Take 1 tablet by mouth every 12 (twelve) hours.        Marland Kitchen donepezil (ARICEPT) 10 MG tablet Take 1 tablet (10 mg total) by mouth at bedtime as needed.  30 tablet  11  . fesoterodine (TOVIAZ) 4 MG TB24 Take 4 mg by mouth daily.      . finasteride (PROSCAR) 5 MG tablet Take 5 mg by mouth daily.        . memantine (NAMENDA) 10 MG tablet Take 10 mg by mouth 2 (two) times daily.      . Multiple Vitamins-Minerals (MULTIVITAMIN WITH MINERALS) tablet Take 1 tablet  by mouth daily.        . QUEtiapine (SEROQUEL) 50 MG tablet Take 50 mg by mouth 2 (two) times daily.      . simvastatin (ZOCOR) 40 MG tablet Take 1 tablet (40 mg total) by mouth at bedtime.  30 tablet  6  . warfarin (COUMADIN) 5 MG tablet Take as directed by Anticoagulation Clinic  35 tablet  3  . [DISCONTINUED] lisinopril (PRINIVIL,ZESTRIL) 10 MG tablet Take 1 tablet (10 mg total) by mouth daily.  30 tablet  11  . clotrimazole-betamethasone (LOTRISONE) cream Apply to rash two times daily  30 g  5  . lisinopril (PRINIVIL,ZESTRIL) 20 MG tablet Take 1 tablet (20 mg total) by mouth daily.  30 tablet  11   No facility-administered encounter medications on file as of 12/28/2012.    No Known Allergies   Current Medications, Allergies, Past Medical History, Past Surgical History, Family History, and Social History were reviewed in [employer] record.    Review of Systems       See HPI - all other systems neg except as noted... For his part, Ricky [name] is mostly asymptomatic; but as noted he has a mild dementia... (MMSE (08/14/09):  Date= Oct 8th, 2010; oriented x3;  Pres= Obama, VP= ?, prev Pres= ?;  3/3 objects immed & 3/3 after distraction;  serial 7's=  100 93 86 ?;  WORLD backwards= ?;  proverbs= concrete interpret) (MMSE (12/16/09):  Improved> VP= Biden, PrevPres= Bush, Serial 7's= 100, 93, 59no79, 71; WORLD backwards= DLROD)   Objective:   Physical Exam     WD, Overwt, 79 y/o WM in NAD... GENERAL:  Alert, pleasant, & cooperative... HEENT:  [place]/AT, EOM-wnl, PERRLA, EACs-clear, TMs-wnl, NOSE-clear, THROAT-clear & wnl. NECK:  Supple w/ fairROM; no JVD; right carotid scar, norm impulses w/o bruits; no thyromegaly or nodules palpated; no lymphadenopathy. CHEST:  Clear to P & A; without wheezes/ rales/ or rhonchi... HEART:  Median sternotomy scar, regular rhythm; without murmurs/ rubs/ or gallops heard... ABDOMEN:  Soft & nontender; normal bowel sounds; no organomegaly or  masses detected. GU:  Red area under foreskin=> prob tinea... EXT:  mild arthritic changes; no varicose veins/ +venous insuffic/ tr edema. NEURO:  CN's intact;  no focal neuro deficits detected... DERM:  excoriated blisters c/o shingles over the right post scapl & neck> C2 distrib...  RADIOLOGY DATA:  Reviewed in the EPIC EMR & discussed w/ the patient...  LABORATORY DATA:  Reviewed in the EPIC EMR & discussed w/ the patient...   Assessment & Plan:             HBP, CAD>  Followed by Dr [name] & stable;  He denies angina, palpit, SOB, dizzy, syncope, edema, etc; BP is sl elev & we will incr Lisinopril to 20mg /d.  PACER>  He has CHB, pacer, & PAF;  Followed by [name] & stable, doing well; he remains on Coumadin via the CC...  CEREBROVASC Dis>  On ASA 81mg /d & Coumadin via the CC;  He denies all cerebral ischemic symptoms...  CHOL>  He remains stable on the Simva40; but needs a better low fat diet & wt reduction...  GI>  Stable w/o new complaints or concerns...  GU>  Followed by DrWrenn & stable on Finasteride + Toviaz; penile lesion looks like tinea- Rx w/ Lotrisone cream...  Dementia>  eval by DrDohmeier reviewed... Continue current therapy Aricept, Namenda, Seroquel, Alpraz...  Anemia>  Prev Fe malabsorption prob but Hg stable x yrs & hasn't required any parenteral Fe...   Patient's Medications  New Prescriptions   CLOTRIMAZOLE-BETAMETHASONE (LOTRISONE) CREAM    Apply to rash two times daily   LISINOPRIL (PRINIVIL,ZESTRIL) 20 MG TABLET    Take 1 tablet (20 mg total) by mouth daily.  Previous Medications   ACETAMINOPHEN (TYLENOL) 500 MG TABLET    Take 500 mg by mouth daily. As needed   ALPRAZOLAM (XANAX) 0.25 MG TABLET    1/2 tablet to 1 tablet by mouth three times a day as needed for nerves.   DEXTROMETHORPHAN-GUAIFENESIN (MUCINEX DM) 30-600 MG PER 12 HR TABLET    Take 1 tablet by mouth every 12 (twelve) hours.     DONEPEZIL (ARICEPT) 10 MG TABLET    Take 1 tablet  (10 mg total) by mouth at bedtime as needed.   FESOTERODINE (TOVIAZ) 4 MG TB24    Take 4 mg by mouth daily.   FINASTERIDE (PROSCAR) 5 MG TABLET    Take 5 mg by mouth daily.     MEMANTINE (NAMENDA) 10 MG TABLET    Take 10 mg by mouth 2 (two) times daily.   MULTIPLE VITAMINS-MINERALS (MULTIVITAMIN WITH MINERALS) TABLET    Take 1 tablet by mouth daily.     QUETIAPINE (SEROQUEL) 50 MG TABLET    Take 50 mg by mouth 2 (two) times daily.   SIMVASTATIN (ZOCOR) 40 MG TABLET  Take 1 tablet (40 mg total) by mouth at bedtime.   WARFARIN (COUMADIN) 5 MG TABLET    Take as directed by Anticoagulation Clinic  Modified Medications   No medications on file  Discontinued Medications   LISINOPRIL (PRINIVIL,ZESTRIL) 10 MG TABLET    Take 1 tablet (10 mg total) by mouth daily.

## 2012-12-31 ENCOUNTER — Ambulatory Visit (INDEPENDENT_AMBULATORY_CARE_PROVIDER_SITE_OTHER): Payer: Medicare Other | Admitting: *Deleted

## 2012-12-31 ENCOUNTER — Other Ambulatory Visit: Payer: Self-pay

## 2012-12-31 ENCOUNTER — Encounter: Payer: Self-pay | Admitting: Cardiology

## 2012-12-31 ENCOUNTER — Ambulatory Visit (INDEPENDENT_AMBULATORY_CARE_PROVIDER_SITE_OTHER): Payer: Medicare Other | Admitting: Cardiology

## 2012-12-31 VITALS — BP 150/80 | HR 70 | Ht 72.0 in | Wt 216.0 lb

## 2012-12-31 DIAGNOSIS — Z7901 Long term (current) use of anticoagulants: Secondary | ICD-10-CM

## 2012-12-31 DIAGNOSIS — I4891 Unspecified atrial fibrillation: Secondary | ICD-10-CM

## 2012-12-31 LAB — PACEMAKER DEVICE OBSERVATION
AL AMPLITUDE: 1.5 mv
BRDY-0002RV: 70 {beats}/min
BRDY-0004RV: 120 {beats}/min
RV LEAD THRESHOLD: 0.625 V

## 2012-12-31 LAB — POCT INR: INR: 3.3

## 2012-12-31 LAB — CBC WITH DIFFERENTIAL/PLATELET
Basophils Absolute: 0 10*3/uL (ref 0.0–0.1)
Eosinophils Absolute: 0.1 10*3/uL (ref 0.0–0.7)
Hemoglobin: 14.4 g/dL (ref 13.0–17.0)
Lymphocytes Relative: 29.6 % (ref 12.0–46.0)
MCHC: 33.2 g/dL (ref 30.0–36.0)
Neutro Abs: 4.9 10*3/uL (ref 1.4–7.7)
Neutrophils Relative %: 61.6 % (ref 43.0–77.0)
RDW: 13.6 % (ref 11.5–14.6)

## 2012-12-31 LAB — BASIC METABOLIC PANEL
CO2: 30 mEq/L (ref 19–32)
Calcium: 9.4 mg/dL (ref 8.4–10.5)
Creatinine, Ser: 1.2 mg/dL (ref 0.4–1.5)
Glucose, Bld: 103 mg/dL — ABNORMAL HIGH (ref 70–99)

## 2012-12-31 NOTE — Progress Notes (Signed)
Pacer check in clinic  

## 2012-12-31 NOTE — Patient Instructions (Addendum)
Your physician wants you to follow-up in: 6 MONTHS with Dr Graciela Husbands.  You will receive a reminder letter in the mail two months in advance. If you don't receive a letter, please call our office to schedule the follow-up appointment.  Your physician recommends that you continue on your current medications as directed. Please refer to the Current Medication list given to you today.  Your physician recommends that you have lab work today: BMP and CBC

## 2013-01-01 NOTE — Progress Notes (Signed)
HPI:  This nice gentleman is in for a follow up visit today.  He is accompanied by his wife, both whom I have known for quite some time.  He denies current chest pain.  He remains on Aricept, and his wife feels like he can be a bit ugly at times, but he is not quite of the same persuasion.  He has no specific cardiac symptoms at this time.     Current Outpatient Prescriptions  Medication Sig Dispense Refill  . acetaminophen (TYLENOL) 500 MG tablet Take 500 mg by mouth daily. As needed      . ALPRAZolam (XANAX) 0.25 MG tablet 1/2 tablet to 1 tablet by mouth three times a day as needed for nerves.  90 tablet  5  . clotrimazole-betamethasone (LOTRISONE) cream Apply to rash two times daily  30 g  5  . dextromethorphan-guaiFENesin (MUCINEX DM) 30-600 MG per 12 hr tablet Take 1 tablet by mouth every 12 (twelve) hours.        Marland Kitchen donepezil (ARICEPT) 10 MG tablet Take 1 tablet (10 mg total) by mouth at bedtime as needed.  30 tablet  11  . fesoterodine (TOVIAZ) 4 MG TB24 Take 4 mg by mouth daily.      . fesoterodine (TOVIAZ) 4 MG TB24 Take 4 mg by mouth daily.      . finasteride (PROSCAR) 5 MG tablet Take 5 mg by mouth daily.        Marland Kitchen lisinopril (PRINIVIL,ZESTRIL) 20 MG tablet Take 1 tablet (20 mg total) by mouth daily.  30 tablet  11  . memantine (NAMENDA) 10 MG tablet Take 10 mg by mouth 2 (two) times daily.      . Multiple Vitamins-Minerals (MULTIVITAMIN WITH MINERALS) tablet Take 1 tablet by mouth daily.        . QUEtiapine (SEROQUEL) 50 MG tablet Take 50 mg by mouth 2 (two) times daily.      . simvastatin (ZOCOR) 40 MG tablet Take 1 tablet (40 mg total) by mouth at bedtime.  30 tablet  6  . warfarin (COUMADIN) 5 MG tablet Take as directed by Anticoagulation Clinic  35 tablet  3   No current facility-administered medications for this visit.    No Known Allergies  Past Medical History  Diagnosis Date  . Obstructive sleep apnea   . Ischemic cardiomyopathy   . Atrial fibrillation   . AV block,  complete s/p ablation   . Pacemaker [place]   . Cerebrovascular disease   . Hypercholesterolemia   . Hiatal hernia   . Gastritis   . Diverticulosis   . Colonic polyp   . BPH (benign prostatic hypertrophy)   . DJD (degenerative joint disease)   . Memory loss   . Anxiety   . Anemia   . Shingles   . Carotid artery occlusion     Past Surgical History  Procedure Laterality Date  . Pacemaker insertion    . Coronary artery bypass graft      x4 by Dr. Sena Hitch 9/00  . Carotid endarterectomy      right 10/04 Dr. Edilia Bo  . Anal fissure repair    . Hiatal hernia repair      incarcerated stomach with takedown and repair of hiatus and gastropexy    Family History  Problem Relation Age of Onset  . Coronary artery disease Other     History   Social History  . Marital Status: Married    Spouse Name: arrie  Number of Children: 1  . Years of Education: N/A   Occupational History  . retired   .     Social History Main Topics  . Smoking status: Former Smoker -- 1.50 packs/day for 50 years    Types: Cigarettes    Quit date: 11/08/1995  . Smokeless tobacco: Never Used  . Alcohol Use: No     Comment: quit drinking back in 04/12/76  . Drug Use: No  . Sexually Active: Not on file   Other Topics Concern  . Not on file   Social History Narrative   Married, wife Milinda Pointer, 55yrs   1 daughter from 1st marriage who lives nearby   Son died 04-12-10 in tractor accident.    ROS: Please see the HPI.  All other systems reviewed and negative.  PHYSICAL EXAM:  BP 150/80  Pulse 70  Ht 6' (1.829 m)  Wt 216 lb (97.977 kg)  BMI 29.29 kg/m2  SpO2 98%  General: delightful older gentleman in no distress.   Head:  Normocephalic and atraumatic. Neck: no JVD.  Left carotid bruit.  Lungs: Clear to auscultation and percussion. Heart: Normal S1 and S2.  No murmur, rubs or gallops.  Abdomen:  Normal bowel sounds; soft; non tender; no organomegaly Pulses: Pulses normal in all 4  extremities. Extremities: No clubbing or cyanosis. No edema. Neurologic: Alert and oriented x 3.  EKG:  V pacing.  Underlying atrial fibrillation.    ASSESSMENT AND PLAN:

## 2013-01-04 NOTE — Assessment & Plan Note (Signed)
Has RCE, and 60-79% LC stenosis.  Will need to be followed up going forward.

## 2013-01-04 NOTE — Assessment & Plan Note (Signed)
Modest control.

## 2013-01-04 NOTE — Assessment & Plan Note (Signed)
Rate is controlled.  He is a prior ablation for the AV node.  V pacing.  Tolerates quite well.  LV preserved but does have some RV enlargement, and mild Pulm HTN, ? OSA related.

## 2013-01-04 NOTE — Assessment & Plan Note (Signed)
Overall remains stable on current regimen.

## 2013-01-04 NOTE — Assessment & Plan Note (Signed)
Last LDL is 96.  Does tolerate statins.

## 2013-01-04 NOTE — Assessment & Plan Note (Signed)
Followed in device clinic. 

## 2013-01-14 ENCOUNTER — Other Ambulatory Visit: Payer: Self-pay

## 2013-01-14 MED ORDER — WARFARIN SODIUM 5 MG PO TABS: ORAL_TABLET | ORAL | Status: DC

## 2013-01-17 ENCOUNTER — Encounter: Payer: Self-pay | Admitting: Internal Medicine

## 2013-01-21 ENCOUNTER — Ambulatory Visit (INDEPENDENT_AMBULATORY_CARE_PROVIDER_SITE_OTHER): Payer: Medicare Other | Admitting: *Deleted

## 2013-01-21 DIAGNOSIS — Z7901 Long term (current) use of anticoagulants: Secondary | ICD-10-CM

## 2013-01-30 ENCOUNTER — Other Ambulatory Visit: Payer: Self-pay | Admitting: *Deleted

## 2013-01-31 DIAGNOSIS — I495 Sick sinus syndrome: Secondary | ICD-10-CM

## 2013-02-11 ENCOUNTER — Ambulatory Visit (INDEPENDENT_AMBULATORY_CARE_PROVIDER_SITE_OTHER): Payer: Medicare Other

## 2013-02-11 DIAGNOSIS — I4891 Unspecified atrial fibrillation: Secondary | ICD-10-CM

## 2013-02-11 DIAGNOSIS — Z7901 Long term (current) use of anticoagulants: Secondary | ICD-10-CM

## 2013-02-11 DIAGNOSIS — I679 Cerebrovascular disease, unspecified: Secondary | ICD-10-CM

## 2013-02-19 ENCOUNTER — Other Ambulatory Visit: Payer: Self-pay | Admitting: Pulmonary Disease

## 2013-02-19 MED ORDER — ALPRAZOLAM 0.25 MG PO TABS: ORAL_TABLET | ORAL | Status: DC

## 2013-02-19 NOTE — Telephone Encounter (Signed)
ALPRAZOLAM 0.25MG  >< TAKE 1/2 TO 1 TABLET  THREE TIMES A DAY PRN NERVES. #90 X5 LAST FILL 01-07-13 OV 03-09-13 RX CALLED IN TO PHARMACY

## 2013-03-08 ENCOUNTER — Ambulatory Visit (INDEPENDENT_AMBULATORY_CARE_PROVIDER_SITE_OTHER): Payer: Medicare Other | Admitting: Pulmonary Disease

## 2013-03-08 ENCOUNTER — Encounter: Payer: Self-pay | Admitting: Pulmonary Disease

## 2013-03-08 VITALS — BP 140/80 | HR 83 | Temp 98.8°F | Ht 69.0 in | Wt 215.0 lb

## 2013-03-08 DIAGNOSIS — F411 Generalized anxiety disorder: Secondary | ICD-10-CM

## 2013-03-08 DIAGNOSIS — R413 Other amnesia: Secondary | ICD-10-CM

## 2013-03-08 DIAGNOSIS — E7800 Pure hypercholesterolemia, unspecified: Secondary | ICD-10-CM

## 2013-03-08 DIAGNOSIS — K5730 Diverticulosis of large intestine without perforation or abscess without bleeding: Secondary | ICD-10-CM

## 2013-03-08 DIAGNOSIS — I6529 Occlusion and stenosis of unspecified carotid artery: Secondary | ICD-10-CM

## 2013-03-08 DIAGNOSIS — N138 Other obstructive and reflux uropathy: Secondary | ICD-10-CM

## 2013-03-08 DIAGNOSIS — M1990 Unspecified osteoarthritis, unspecified site: Secondary | ICD-10-CM

## 2013-03-08 DIAGNOSIS — I2510 Atherosclerotic heart disease of native coronary artery without angina pectoris: Secondary | ICD-10-CM

## 2013-03-08 DIAGNOSIS — N401 Enlarged prostate with lower urinary tract symptoms: Secondary | ICD-10-CM

## 2013-03-08 DIAGNOSIS — I10 Essential (primary) hypertension: Secondary | ICD-10-CM

## 2013-03-08 DIAGNOSIS — K2990 Gastroduodenitis, unspecified, without bleeding: Secondary | ICD-10-CM

## 2013-03-08 DIAGNOSIS — I4891 Unspecified atrial fibrillation: Secondary | ICD-10-CM

## 2013-03-08 MED ORDER — TRAMADOL HCL 50 MG PO TABS: 50.0000 mg | ORAL_TABLET | Freq: Three times a day (TID) | ORAL | Status: DC | PRN

## 2013-03-08 NOTE — Patient Instructions (Addendum)
Today we updated your med list in our EPIC system...    Continue your current medications the same...  Try the new TRAMADOL 50mg  up to three times daily as needed for pain...  Call for any questions...  Let's plan a follow up visit in 16mo (w/ fasting labs), sooner if needed for problems.Marland KitchenMarland Kitchen

## 2013-03-08 NOTE — Progress Notes (Signed)
Subjective:    Patient ID: Ricky [name], male    DOB: 08/14/1931, 79 y.o.   MRN: [id number]  HPI 79 y/o WM here for a follow up visit... he has multiple medical problems including:  OSA;  CAD- s/p CABG;  Hx AFib/ CHB/ Pacer;  Cerebrovasc dis- s/p RtCAE;  Hyperchol;  HH/ Gastritis/ hx gastric volvulus w/ surg2009;  Divertics/ colon polyps;  BPH w/ boo;  DJD;  Early dementia w/ personality changes;  Hx anemia w/ Fe malaborption- resolved...  ~  September 07, 2011:  13mo ROV     CAD, s/p CABG> on Lisinopril10; BP= 124/80 & he is largely asymptomatic- denies CP, palpit, SOB, edema, etc...    AFib, AVBlock, Pacer> on Coumadin via CC; [name] checked pacer 10/12 & working well, no changes made...    Cerebrovasc dis w/ right CAE 2004> he has a known 60-79% left ICA stenosis & gets CDopplers yearly (stable x several yrs); stable w/o cerebral ischemic symptoms...    Hyperlipid> on Simva40 + diet efforts; FLP shows Chol ok but TG sl elev (see below); reviewed low fat diet recs...    GI> HH, Gastritis, Divertics, Polyps> on OTC Prilosec 20mg  as needed; treated for HPylori 2007; known massive HH s/p lap-reduced HH, repair of the hiatus & gastropexy 2009 by DrMartin...    BPH & OB> on Proscar5 & Enablex7.5; seen by DrWrenn 8/12 & stable, no changes made, f/u 51yr...    DJD> he uses Tylenol for pain as needed; he has no specific complaints...    Dementia> he has a senile dementia w/ behavioral changes that are really hard on the wife; on Aricept10 & Namenda10Bid; followed by DrDohmeier for Neuro, last seen 6/12 & Namenda added...    Anxiety/ Agitation> on Alprazolam 0.25mg  Prn and Seroquel 50mg  Qhs...   ~  Mar 07, 2012:  13mo ROV & Ricky [name] has no complaints; he continues regular appts in the coumadin clinic & well regulated;  [facility] sent a memo stating that his BP was ~180/90 when they checked him but he is 136/88 today & has been well controlled on his Lisinopril 10mg /d, we decided to continue same...  We reviewed his  problems, meds, XRays, & Labs>  See prob list below>>  ~  September 07, 2012:  13mo ROV & his home BP checks have been up & down therefore reminded to take meds every day, no salt, get wt down... We reviewed the following medical problems during today's office visit>>     OSA> he refused CPAP & he denies sleeping problem, says he wakes refreshed, denies daytime sleepiness (but he's demented)...    ASHD, Cardiomyopathy> on Lisin10; followed by [name] for Cards & seen 9/13- stable, no changes made...    AFib, Heart block, Pacer> on Coumadin; followed by [name] for EP & seen 10/13- pacer function normally, no changes made...    Cerebrovasc dis> he has memory loss/ dementia; denies cerebral ischemic symptoms...    Chol> on Simva40; FLP 10/13 showed TChol 177, TG 186, HDL 44, LDL 96    GI- Gastritis, Divertics> on Omep20; denies abd pain, n/v, c/d, blood seen; last colonoscopy in 2004 w/ polyp removed...    BPH> on Toviaz4, Proscar5; followed by DrWrenn & seen 8/13- stable BPH, BOO, OAB...    DJD> on Tylenol & OTC meds as needed;     Dementia> on Aricept10, Namenda10Bid, Seroquel50Bid; wife continues to c/o his behavior- eg accused wife of stealing money; they have f/u w/ Neuro soon.Marland KitchenMarland Kitchen  Anxiety> on Xanax0.25 prn use... We reviewed prob list, meds, xrays and labs> see below for updates >> OK Flu shot today; he had TDAP in 2008; Pneumovax in 2007... LABS 10/13:  FLP- at goals on Simva40 x TG=186;  Chems- wnl;  CBC- wnl...  ~  December 28, 2012:  3-69mo ROV & add-on appt due to 2 problems:  1) they have noted that his BP at home has been elev in the 160-170/90 range; he recently had an eye exam & the eye doc was "really concerned" according to the wife; pt denies HA, visual changes, CP, palpit, dizzy, syncope, SOB, edema- he remains asymptomatic; today's BP= 142/68 on Lisinopril10 & we discussed incr to 20mg /d, continue to monitor BP at home...  2) he has noted a lesion on his penis- exam shows sm are of  irritation under the foreskin, no evid of phimosis, no exophytic lesions or ulcerated sores, no adenopathy; we decided to treat w/ Lotirsone cream & we will refer to Urology if it is unresolved... Other problems as noted- reviewed & stable...   ~  Mar 08, 2013:  2-59mo ROV & Ricky [name] says he feels well, doing well, no complaints or concerns; obvious mod dementia & wife very frustrated w/ dealing w/ him but no opportunity for help from children etc & she copes the best she can; wife indicates that he c/o back discomfort intermittently (we will Rx w/ Tramadol prn);  We reviewed the following medical problems during today's office visit >>     HBP, ASHD, Cardiomyopathy> on Lisin20 now; followed by [name] for Cards & seen 2/14- stable, no changes made...    AFib, Heart block, Pacer> on Coumadin; followed by [name] for EP & seen 10/13- pacer function normally, no changes made...    Cerebrovasc dis> on ASA81; HxTIA 2004 w/ right CAE; last CDoppler 1/14 by [facility] showed patent right CAE, 60-79% LICAstenosis unchanged; denies cerebral ischemic symptoms...    Chol> on Simva40; FLP 10/13 showed TChol 177, TG 186, HDL 44, LDL 96; we reviewed diet recommendations...    BPH> on Toviaz4, Proscar5; followed by DrWrenn & seen 8/13- stable BPH, BOO, OAB...    DJD> on Tylenol & OTC meds as needed; c/o back pain as noted- try Tramadol prn...    Dementia> on Aricept10, Namenda10Bid, Seroquel50Bid; wife continues to c/o his behavior; we do not have any recent Neurology notes...    Anxiety> on Xanax0.25 prn use... We reviewed prob list, meds, xrays and labs> see below for updates >>           Problem List:      OBSTRUCTIVE SLEEP APNEA (ICD-327.23) - sleep study 5/03 showed RDI=27 w/ desat to 86% & mod snoring... he refused CPAP Rx, and denies daytime hypersomnolence... apparently snoring doesn't disturb wife & pt rests adeq but wakes daily at 4-5AM, "I sleep better in my recliner in front of the TV"... he does not want  further intervention. ~  CXR 8/09 showed stable heart size, left subclav pacer, clear lungs, largeHH w/ A/F levels...  HYPERTENSION >> ATHEROSCLEROTIC HEART DISEASE (ICD-414.00) >> CARDIOMYOPATHY, ISCHEMIC S/P CABG (ICD-414.8) >> on LISINOPRIL 10mg /d; followed by [name] Graciela Husbands for Cardiology...  ~  s/p CABG 9/00 and AFlutter ablation w/ pacer in 1998... ~  8/11:  f/u by [name]- stable, no changes made... ~  2DEcho 9/11 showed mild LVH, EF=55% w/ dyssynergy c/w RV pacing, biatrial enlargem, mild RV dil & pulm HTN. ~  He continues f/u w/ [name]- 8/12 & [name]-10/12;  CAD, AFib, AVjunctional ablation & pacemaker, on Coumadin; stable at last checks & no changes made... ~  He saw [name] 9/13> stable, no change in meds, he had him ret for fasting labs 10/13 as noted; EKG= pacer rhythm... ~  2/14: BP at home has been elev in the 160-170/90 range; recent eye exam & "doc was really concerned" according to the wife; pt denies HA, visual changes, CP, palpit, dizzy, syncope, SOB, edema- he remains asymptomatic; today's BP= 142/68 on Lisinopril10 & we discussed incr to 20mg /d, continue to monitor BP at home. ~  5/14: on Lisin20- BP= 140/80 & he denies CP, palpit, dizzy, SOB, edema, etc...  ATRIAL FIBRILLATION, HX OF (ICD-V12.59), AV BLOCK, COMPLETE (ICD-426.0), & PACEMAKER, PERMANENT DDD STJ (ICD-V45.01) - on COUMADIN and followed in the Coumadin Clinic...  ~  he had Ablation in the past &  a pacemaker placed for heart block by [name] who continues to follow regularly... ~  EKG 9/13 & 2/14 showed a ventric paced rhythm... ~  10/13: he had f/u [name]> pacer checked, doing well, no changes made to the programming...   CEREBROVASCULAR DISEASE (ICD-437.9) - on ASA 81mg /d... he had a TIA in 2004 w/ carotid eval showing high grade right carotid obstruction... s/p right CAE by DrCDickson 10/04... CDopplers per [facility]... ~  CDoppler 1/09 showed 60-79% left ICA stenosis, & normal right ICA- s/p surg. ~   CDoppler 1/10 showed  patent right ICA with no evidence of restenosis, 60-79% stenosis of the left ICA. ~  CT Brain 10/10 showed atrophy & sm vessel dis, NAD. ~  CDoppler 1/11 showed patent right CAE site with no ICA stenosis, 60%-79% stenosis of the left ICA (no change from 7/10). ~  CDoppler 1/12 by [facility] showed patent right CAE, min hyperplasia w/o hemodynamic changes, 60-79% left ICA stenosis, right ECA stenosis, vertebrals antegrade. ~  CDoppler 1/14 by [facility] are identical to 1/12- no change... They rec f/u in 3mo...  HYPERCHOLESTEROLEMIA (ICD-272.0) - on SIMVASTATIN 40mg /d + low chol diet... ~  FLP 7/08 on Lip40 showed TChol 143, TG 173, HDL 40, LDL 69 ~  FLP 1/09 showed TChol 176, TG 155, HDL 42, LDL 103... Lipitor changed to Simva80 for $$$. ~  FLP 7/09 on Simva80 showed TChol 120, TG 150, HDL 42, LDL 48... rec- keep same. ~  FLP 1/10 on Simva80 showed TChol 149, TG 143, HDL 44, LDL 77 ~  9/10: [name] decr his Simva80 to Simva40- wife states "the government cut it back"... ~  FLP 10/10 on Simva40 showed TChol 183, TG 199, HDL 45, LDL 99 ~  FLP 10/11 on Simva40 showed TChol 160, TG 196, HDL 54, LDL 67 ~  FLP 4/12 on Simva40 showed TChol 153, TG 156, HDL 46, LDL 76 ~  FLP 10/12 on Simva40 showed TChol 174, TG 217, HDL 49, LDL 102 ~  FLP 10/13 on Simva40 showed TChol 177, TG 186, HDL 44, LDL 96  GASTRITIS (ICD-535.50) - on OMEPRAZOLE 20mg /d... prev treated for HPylori in 2007...he has a large HH and last EGD 6/08 by Dorris Singh revealed an esoph stricture- dilated... hosp 4/09 w/ part gastric volvulus and most of stomach in chest... EGD showed massive HH, no lesions seen... Aug09 [name] did laparosopically reduced HH w/ repair of the hiatus & gastropexy to keep it reduced (Nissen not done).  DIVERTICULOSIS OF COLON (ICD-562.10) & COLONIC POLYPS (ICD-211.3) - 3mm adenomatous polyp removed from ascending colon in 2001... last colonoscopy by [facility] 4/04 w/ divertics + polyp... he's been  eval  for subseq rectal bleeding w/ fissure dx'd and Rx w/ botox 7/08 by [name]... he uses PROCTOCORT Cream Prn...  BENIGN PROSTATIC HYPERTROPHY, WITH OBSTRUCTION (ICD-600.01) - eval by DrPeterson 2010 for obstructive symptoms and hematuria (related to his coumadin)... neg cysto & started on PROSCAR 5mg /d... ~  labs 10/11 showed PSA= 1.00 ~   4/12:  new overactive bladder symptoms & we decided to try Enablex Qhs...  ~  8/12:  He had Urology f/u DrWrenn- doing well on FINASTERIDE 5mg /d & ENABLEX7.5=> changed to TOVIAZ 8mg /d... ~  Labs here 10/12 showed PSA= 0.91 ~  Urology f/u 8/13 by DrWrenn> BPH, BOO, OAB; they decr his Toviaz to 4mg /d... ~  2/14: presents w/ ?tinea corporis lesion on penis=> rx w/ Lotrisone cream & if unresolved we will refer to Urology...  DEGENERATIVE JOINT DISEASE (ICD-715.90)  MEMORY LOSS (ICD-780.93) - 10/10 OV wife voiced concern regarding memory loss & personality change- more argumentative, belligerent etc... MMSE showed changes and we decided to Rx w/ ARICEPT 10mg /d... ~  CT Head 10/10 showed mild atrophy & advanced chr ischemic changes, ventriculomegaly & vol loss, NAD... ~  10/11:  We added SEROQUEL 25mg  Qhs due to increased belligerence & set up Neuro referral... ~  12/11:  He had eval DrDohmeier> note reviewed, she agreed w/ Aricept, consider Namenda; she checked EEG- neg, non focal... ~  6/12:  f/u by Neurology w/ continued dementia & behavioral issues; we increased Seroquel to 50mg  & they rec adding NAMENDA 10mg Bid...  ANXIETY (ICD-300.00) - he's on ALPRAZOLAM 0.25mg  as needed.  Hx of ANEMIA (ICD-285.9) - he has been eval by [name] w/ an iron malabsorption anemia (bone marrow in 2001 w/ absent iron stores)... prev Rx w/ parenteral iron infusions... ~  labs 10/10 by [name] showed Hg= 14.6, Fe= 107, Ferritin= 120... ~  labs here 10/11 showed Hg= 15.2, MCV= 97 > hasn't required IV Fe in over 75yrs & [name] signed off. ~  Labs here 4/12 showed Hg=  14.4 ~  Labs here 10/12 showed Hg= 15.1  SHINGLES (ICD-053.9) - presented 4/11 w/ right C2 shingles> Rx w/ ACYCLOVIR, MEDROL, VICODIN... we discussed Shingles Vaccine as well & they will think about it...  Health Maintenance ~  Immunizations:  had PNEUMOVAX 2007... received 2010 Flu shot 10/8...   Past Surgical History  Procedure Laterality Date  . Pacemaker insertion    . Coronary artery bypass graft      x4 by Dr. Sena Hitch 9/00  . Carotid endarterectomy      right 10/04 Dr. Edilia Bo  . Anal fissure repair    . Hiatal hernia repair      incarcerated stomach with takedown and repair of hiatus and gastropexy    Outpatient Encounter Prescriptions as of 03/08/2013  Medication Sig Dispense Refill  . acetaminophen (TYLENOL) 500 MG tablet Take 500 mg by mouth daily. As needed      . ALPRAZolam (XANAX) 0.25 MG tablet 1/2 tablet to 1 tablet by mouth three times a day as needed for nerves.  90 tablet  5  . clotrimazole-betamethasone (LOTRISONE) cream Apply to rash two times daily  30 g  5  . dextromethorphan-guaiFENesin (MUCINEX DM) 30-600 MG per 12 hr tablet Take 1 tablet by mouth every 12 (twelve) hours.        Marland Kitchen donepezil (ARICEPT) 10 MG tablet Take 1 tablet (10 mg total) by mouth at bedtime as needed.  30 tablet  11  . fesoterodine (TOVIAZ) 4 MG TB24 Take 4 mg by  mouth daily.      . finasteride (PROSCAR) 5 MG tablet Take 5 mg by mouth daily.        Marland Kitchen latanoprost (XALATAN) 0.005 % ophthalmic solution Place 1 drop into both eyes daily. Ordered by Dr Eulah Pont      . lisinopril (PRINIVIL,ZESTRIL) 20 MG tablet Take 1 tablet (20 mg total) by mouth daily.  30 tablet  11  . memantine (NAMENDA) 10 MG tablet Take 10 mg by mouth 2 (two) times daily.      . QUEtiapine (SEROQUEL) 50 MG tablet Take 50 mg by mouth 2 (two) times daily.      . simvastatin (ZOCOR) 40 MG tablet Take 1 tablet (40 mg total) by mouth at bedtime.  30 tablet  6  . warfarin (COUMADIN) 5 MG tablet Take as directed by  Anticoagulation Clinic  35 tablet  3  . [DISCONTINUED] fesoterodine (TOVIAZ) 4 MG TB24 Take 4 mg by mouth daily.      . [DISCONTINUED] Multiple Vitamins-Minerals (MULTIVITAMIN WITH MINERALS) tablet Take 1 tablet by mouth daily.         No facility-administered encounter medications on file as of 03/08/2013.    No Known Allergies   Current Medications, Allergies, Past Medical History, Past Surgical History, Family History, and Social History were reviewed in [employer] record.    Review of Systems       See HPI - all other systems neg except as noted... For his part, Ricky [name] is mostly asymptomatic; but as noted he has a mild dementia... (MMSE (08/14/09):  Date= Oct 8th, 2010; oriented x3;  Pres= Obama, VP= ?, prev Pres= ?;  3/3 objects immed & 3/3 after distraction;  serial 7's= 100 93 86 ?;  WORLD backwards= ?;  proverbs= concrete interpret) (MMSE (12/16/09):  Improved> VP= Biden, PrevPres= Bush, Serial 7's= 100, 93, 59no79, 71; WORLD backwards= DLROD)   Objective:   Physical Exam     WD, Overwt, 79 y/o WM in NAD... GENERAL:  Alert, pleasant, & cooperative... HEENT:  [place]/AT, EOM-wnl, PERRLA, EACs-clear, TMs-wnl, NOSE-clear, THROAT-clear & wnl. NECK:  Supple w/ fairROM; no JVD; right carotid scar, norm impulses w/o bruits; no thyromegaly or nodules palpated; no lymphadenopathy. CHEST:  Clear to P & A; without wheezes/ rales/ or rhonchi... HEART:  Median sternotomy scar, regular rhythm; without murmurs/ rubs/ or gallops heard... ABDOMEN:  Soft & nontender; normal bowel sounds; no organomegaly or masses detected. GU:  Red area under foreskin=> prob tinea... EXT:  mild arthritic changes; no varicose veins/ +venous insuffic/ tr edema. NEURO:  CN's intact;  no focal neuro deficits detected... DERM:  excoriated blisters c/o shingles over the right post scapl & neck> C2 distrib...  RADIOLOGY DATA:  Reviewed in the EPIC EMR & discussed w/ the patient...  LABORATORY  DATA:  Reviewed in the EPIC EMR & discussed w/ the patient...   Assessment & Plan:             HBP, CAD>  Followed by Dr [name] & stable;  He denies angina, palpit, SOB, dizzy, syncope, edema, etc; BP is sl elev & we will incr Lisinopril to 20mg /d.  PACER>  He has CHB, pacer, & PAF;  Followed by [name] & stable, doing well; he remains on Coumadin via the CC...  CEREBROVASC Dis>  On ASA 81mg /d & Coumadin via the CC;  He denies all cerebral ischemic symptoms...  CHOL>  He remains stable on the Simva40; but needs a better low fat diet & wt  reduction...  GI>  Stable w/o new complaints or concerns...  GU>  Followed by DrWrenn & stable on Finasteride + Toviaz; penile lesion looks like tinea- Rx w/ Lotrisone cream...  Dementia>  eval by DrDohmeier reviewed... Continue current therapy Aricept, Namenda, Seroquel, Alpraz...  Anemia>  Prev Fe malabsorption prob but Hg stable x yrs & hasn't required any parenteral Fe...   Patient's Medications  New Prescriptions   TRAMADOL (ULTRAM) 50 MG TABLET    Take 1 tablet (50 mg total) by mouth 3 (three) times daily as needed for pain.  Previous Medications   ACETAMINOPHEN (TYLENOL) 500 MG TABLET    Take 500 mg by mouth daily. As needed   ALPRAZOLAM (XANAX) 0.25 MG TABLET    1/2 tablet to 1 tablet by mouth three times a day as needed for nerves.   CLOTRIMAZOLE-BETAMETHASONE (LOTRISONE) CREAM    Apply to rash two times daily   DEXTROMETHORPHAN-GUAIFENESIN (MUCINEX DM) 30-600 MG PER 12 HR TABLET    Take 1 tablet by mouth every 12 (twelve) hours.     DONEPEZIL (ARICEPT) 10 MG TABLET    Take 1 tablet (10 mg total) by mouth at bedtime as needed.   FESOTERODINE (TOVIAZ) 4 MG TB24    Take 4 mg by mouth daily.   FINASTERIDE (PROSCAR) 5 MG TABLET    Take 5 mg by mouth daily.     LATANOPROST (XALATAN) 0.005 % OPHTHALMIC SOLUTION    Place 1 drop into both eyes daily. Ordered by Dr Eulah Pont   LISINOPRIL (PRINIVIL,ZESTRIL) 20 MG TABLET    Take 1 tablet (20  mg total) by mouth daily.   MEMANTINE (NAMENDA) 10 MG TABLET    Take 10 mg by mouth 2 (two) times daily.   QUETIAPINE (SEROQUEL) 50 MG TABLET    Take 50 mg by mouth 2 (two) times daily.   SIMVASTATIN (ZOCOR) 40 MG TABLET    Take 1 tablet (40 mg total) by mouth at bedtime.   WARFARIN (COUMADIN) 5 MG TABLET    Take as directed by Anticoagulation Clinic  Modified Medications   No medications on file  Discontinued Medications   FESOTERODINE (TOVIAZ) 4 MG TB24    Take 4 mg by mouth daily.   MULTIPLE VITAMINS-MINERALS (MULTIVITAMIN WITH MINERALS) TABLET    Take 1 tablet by mouth daily.

## 2013-03-11 ENCOUNTER — Ambulatory Visit (INDEPENDENT_AMBULATORY_CARE_PROVIDER_SITE_OTHER): Payer: Medicare Other

## 2013-03-11 DIAGNOSIS — I679 Cerebrovascular disease, unspecified: Secondary | ICD-10-CM

## 2013-03-11 DIAGNOSIS — I4891 Unspecified atrial fibrillation: Secondary | ICD-10-CM

## 2013-03-11 DIAGNOSIS — Z7901 Long term (current) use of anticoagulants: Secondary | ICD-10-CM

## 2013-03-11 LAB — POCT INR: INR: 2.3

## 2013-03-13 ENCOUNTER — Encounter: Payer: Self-pay | Admitting: Nurse Practitioner

## 2013-03-13 ENCOUNTER — Ambulatory Visit (INDEPENDENT_AMBULATORY_CARE_PROVIDER_SITE_OTHER): Payer: Medicare Other | Admitting: Nurse Practitioner

## 2013-03-13 VITALS — BP 155/73 | HR 76 | Ht 71.0 in | Wt 216.0 lb

## 2013-03-13 DIAGNOSIS — F0281 Dementia in other diseases classified elsewhere with behavioral disturbance: Secondary | ICD-10-CM

## 2013-03-13 DIAGNOSIS — F02818 Dementia in other diseases classified elsewhere, unspecified severity, with other behavioral disturbance: Secondary | ICD-10-CM

## 2013-03-13 MED ORDER — MEMANTINE HCL ER 28 MG PO CP24: 28.0000 mg | ORAL_CAPSULE | Freq: Every morning | ORAL | Status: DC

## 2013-03-13 NOTE — Progress Notes (Signed)
HPI: His returns for followup after last visit 09/12/2012. He has a history of dementia and associated personality changes. He no longer takes care of the checking account, forgets appointments, he remains independent in dressing, feeding and bathing herself. His agitation has gotten better with Seroquel. Memory is about the same. He is currently on Aricept and Namenda. He is sleeping well at night. Wife gets little help from children    ROS:  - weight gain, blurred vision, wheezing, snoring, cramps, runny nose, decreased energy  Physical Exam General: well developed, well nourished, seated, in no evident distress Head: head normocephalic and atraumatic. Oropharynx benign Neck: supple with no carotid or supraclavicular bruits Cardiovascular: regular rate and rhythm, no murmurs  Neurologic Exam Mental Status: Awake and alert. MMSE 20/30 missing items in orientation, attention, recall and unable to copy a figure . AFT 10.   Cranial Nerves:  Pupils equal, briskly reactive to light. Extraocular movements full without nystagmus. Visual fields full to confrontation. Hearing intact and symmetric to finger snap. Facial sensation intact. Face, tongue, palate move normally and symmetrically. Neck flexion and extension normal.  Motor: Normal bulk and tone. Normal strength in all tested extremity muscles. Sensory.: intact to touch and pinprick and vibratory.  Coordination: Rapid alternating movements normal in all extremities. Finger-to-nose and heel-to-shin performed accurately bilaterally. Gait and Station: Arises from chair without difficulty. Stance is wide based. Tandem gait is unsteady, Romberg is negative. No assistive device.  Reflexes: 1+ and symmetric. Toes downgoing.     ASSESSMENT: Dementia with frontal lobe component, MMSE 20/30 missing items in orientation, attention, recall, and unable to copy a figure. AFT 10.     PLAN: Will switch to Namenda XR 28 mg, free coupon for one month given  with RX continue Aricept 10 mg daily Continue Seroquel 50 mg  (2) daily, Followup in 6 months  Nilda Riggs, GNP-BC APRN

## 2013-03-13 NOTE — Patient Instructions (Addendum)
Will switch to Namenda XR 28 mg, free coupon for one month  continue Aricept 10 mg daily Continue Seroquel 50 mg  (2) daily, Followup in 6 months

## 2013-04-15 ENCOUNTER — Ambulatory Visit (INDEPENDENT_AMBULATORY_CARE_PROVIDER_SITE_OTHER): Payer: Medicare Other | Admitting: *Deleted

## 2013-04-15 DIAGNOSIS — I4891 Unspecified atrial fibrillation: Secondary | ICD-10-CM

## 2013-04-15 DIAGNOSIS — I679 Cerebrovascular disease, unspecified: Secondary | ICD-10-CM

## 2013-04-15 DIAGNOSIS — Z7901 Long term (current) use of anticoagulants: Secondary | ICD-10-CM

## 2013-04-23 ENCOUNTER — Telehealth: Payer: Self-pay | Admitting: Pulmonary Disease

## 2013-04-23 NOTE — Telephone Encounter (Signed)
I spoke with the pt wife and she states this morning the pt was c/o his ankles being swollen and when she looked at them she states he has raised red "sores" from mid-calf down to his ankles on both legs. She states the best way she can describe the sores are they look like the sores you get with shingles.  She states his legs and ankles are both very red. She states both ankles are swollen as well. She denies any heat to the area, no itching, and states the bumps are not open and are not draining. She denies any changes in pt diet, detergent, etc. The pt has been under a lot of stress due to passing of daughter last week. Pt requesting an appt tomorrow afternoon if possible. Please advise. Carron Curie, CMA No Known Allergies

## 2013-04-23 NOTE — Telephone Encounter (Signed)
Appt set for Thursday at 9:15. I had already advised that it was not shingles due to it being on both legs. I advised that if anything changes or gets worse to call tomorrow. Carron Curie, CMA

## 2013-04-23 NOTE — Telephone Encounter (Signed)
Per SN---   Pt will need ov with TP or SN to eval legs.   Both legs is not shingles.

## 2013-04-24 ENCOUNTER — Other Ambulatory Visit: Payer: Self-pay | Admitting: Neurology

## 2013-04-24 ENCOUNTER — Other Ambulatory Visit: Payer: Self-pay | Admitting: Nurse Practitioner

## 2013-04-25 ENCOUNTER — Encounter: Payer: Self-pay | Admitting: Adult Health

## 2013-04-25 ENCOUNTER — Ambulatory Visit (INDEPENDENT_AMBULATORY_CARE_PROVIDER_SITE_OTHER): Payer: Medicare Other | Admitting: Adult Health

## 2013-04-25 ENCOUNTER — Other Ambulatory Visit (INDEPENDENT_AMBULATORY_CARE_PROVIDER_SITE_OTHER): Payer: Medicare Other

## 2013-04-25 VITALS — BP 150/80 | HR 76 | Temp 98.3°F | Ht 70.0 in | Wt 214.6 lb

## 2013-04-25 DIAGNOSIS — I10 Essential (primary) hypertension: Secondary | ICD-10-CM

## 2013-04-25 DIAGNOSIS — I2510 Atherosclerotic heart disease of native coronary artery without angina pectoris: Secondary | ICD-10-CM

## 2013-04-25 DIAGNOSIS — I872 Venous insufficiency (chronic) (peripheral): Secondary | ICD-10-CM

## 2013-04-25 LAB — BASIC METABOLIC PANEL
BUN: 13 mg/dL (ref 6–23)
CO2: 31 mEq/L (ref 19–32)
Calcium: 10.2 mg/dL (ref 8.4–10.5)
Chloride: 101 mEq/L (ref 96–112)
Creatinine, Ser: 1.1 mg/dL (ref 0.4–1.5)

## 2013-04-25 LAB — BRAIN NATRIURETIC PEPTIDE: Pro B Natriuretic peptide (BNP): 102 pg/mL — ABNORMAL HIGH (ref 0.0–100.0)

## 2013-04-25 MED ORDER — FUROSEMIDE 20 MG PO TABS: 20.0000 mg | ORAL_TABLET | Freq: Every day | ORAL | Status: DC | PRN

## 2013-04-25 NOTE — Patient Instructions (Addendum)
Begin Lasix 20mg  2 tabs today then 1 tab daily until seen back in office in 1 week .  Labs today.  Low salt diet  Keep legs elevated as much as possible.  Wash legs daily and pat dry.  May apply Hydrocortisone cream to lower legs daily until seen back in office.  Please contact office for sooner follow up if symptoms do not improve or worsen or seek emergency care

## 2013-04-25 NOTE — Progress Notes (Signed)
Subjective:    Patient ID: Ricky [name], male    DOB: 22-Mar-1931, 79 y.o.   MRN: [id number]  HPI 79 y/o WM here for a follow up visit... he has multiple medical problems including:  OSA;  CAD- s/p CABG;  Hx AFib/ CHB/ Pacer;  Cerebrovasc dis- s/p RtCAE;  Hyperchol;  HH/ Gastritis/ hx gastric volvulus w/ surg2009;  Divertics/ colon polyps;  BPH w/ boo;  DJD;  Early dementia w/ personality changes;  Hx anemia w/ Fe malaborption- resolved...  ~  September 07, 2011:  12mo ROV     CAD, s/p CABG> on Lisinopril10; BP= 124/80 & he is largely asymptomatic- denies CP, palpit, SOB, edema, etc...    AFib, AVBlock, Pacer> on Coumadin via CC; Ricky [name] checked pacer 10/12 & working well, no changes made...    Cerebrovasc dis w/ right CAE 2004> he has a known 60-79% left ICA stenosis & gets CDopplers yearly (stable x several yrs); stable w/o cerebral ischemic symptoms...    Hyperlipid> on Simva40 + diet efforts; FLP shows Chol ok but TG sl elev (see below); reviewed low fat diet recs...    GI> HH, Gastritis, Divertics, Polyps> on OTC Prilosec 20mg  as needed; treated for HPylori 2007; known massive HH s/p lap-reduced HH, repair of the hiatus & gastropexy 2009 by Ricky [name]...    BPH & OB> on Proscar5 & Enablex7.5; seen by Ricky [name] 8/12 & stable, no changes made, f/u 1yr...    DJD> he uses Tylenol for pain as needed; he has no specific complaints...    Dementia> he has a senile dementia w/ behavioral changes that are really hard on the wife; on Aricept10 & Namenda10Bid; followed by Ricky [name] for Neuro, last seen 6/12 & Namenda added...    Anxiety/ Agitation> on Alprazolam 0.25mg  Prn and Seroquel 50mg  Qhs...   ~  Mar 07, 2012:  12mo ROV & Ricky [name] has no complaints; he continues regular appts in the coumadin clinic & well regulated;  [facility] sent a memo stating that his BP was ~180/90 when they checked him but he is 136/88 today & has been well controlled on his Lisinopril 10mg /d, we decided to continue same...  We reviewed his  problems, meds, XRays, & Labs>  See prob list below>>  ~  September 07, 2012:  12mo ROV & his home BP checks have been up & down therefore reminded to take meds every day, no salt, get wt down... We reviewed the following medical problems during today's office visit>>     OSA> he refused CPAP & he denies sleeping problem, says he wakes refreshed, denies daytime sleepiness (but he's demented)...    ASHD, Cardiomyopathy> on Lisin10; followed by Ricky [name] for Cards & seen 9/13- stable, no changes made...    AFib, Heart block, Pacer> on Coumadin; followed by Ricky [name] for EP & seen 10/13- pacer function normally, no changes made...    Cerebrovasc dis> he has memory loss/ dementia; denies cerebral ischemic symptoms...    Chol> on Simva40; FLP 10/13 showed TChol 177, TG 186, HDL 44, LDL 96    GI- Gastritis, Divertics> on Omep20; denies abd pain, n/v, c/d, blood seen; last colonoscopy in 2004 w/ polyp removed...    BPH> on Toviaz4, Proscar5; followed by Ricky [name] & seen 8/13- stable BPH, BOO, OAB...    DJD> on Tylenol & OTC meds as needed;     Dementia> on Aricept10, Namenda10Bid, Seroquel50Bid; wife continues to c/o his behavior- eg accused wife of stealing money; they have f/u w/ Neuro soon.Marland KitchenMarland Kitchen  Anxiety> on Xanax0.25 prn use... We reviewed prob list, meds, xrays and labs> see below for updates >> OK Flu shot today; he had TDAP in 2008; Pneumovax in 2007... LABS 10/13:  FLP- at goals on Simva40 x TG=186;  Chems- wnl;  CBC- wnl...  ~  December 28, 2012:  3-32mo ROV & add-on appt due to 2 problems:  1) they have noted that his BP at home has been elev in the 160-170/90 range; he recently had an eye exam & the eye doc was "really concerned" according to the wife; pt denies HA, visual changes, CP, palpit, dizzy, syncope, SOB, edema- he remains asymptomatic; today's BP= 142/68 on Lisinopril10 & we discussed incr to 20mg /d, continue to monitor BP at home...  2) he has noted a lesion on his penis- exam shows sm are of  irritation under the foreskin, no evid of phimosis, no exophytic lesions or ulcerated sores, no adenopathy; we decided to treat w/ Lotirsone cream & we will refer to Urology if it is unresolved... Other problems as noted- reviewed & stable...   ~  Mar 08, 2013:  2-47mo ROV & [name] says he feels well, doing well, no complaints or concerns; obvious mod dementia & wife very frustrated w/ dealing w/ him but no opportunity for help from children etc & she copes the best she can; wife indicates that he c/o back discomfort intermittently (we will Rx w/ Tramadol prn);  We reviewed the following medical problems during today's office visit >>     HBP, ASHD, Cardiomyopathy> on Lisin20 now; followed by Ricky [name] for Cards & seen 2/14- stable, no changes made...    AFib, Heart block, Pacer> on Coumadin; followed by Ricky [name] for EP & seen 10/13- pacer function normally, no changes made...    Cerebrovasc dis> on ASA81; HxTIA 2004 w/ right CAE; last CDoppler 1/14 by [facility] showed patent right CAE, 60-79% LICAstenosis unchanged; denies cerebral ischemic symptoms...    Chol> on Simva40; FLP 10/13 showed TChol 177, TG 186, HDL 44, LDL 96; we reviewed diet recommendations...    BPH> on Toviaz4, Proscar5; followed by Ricky [name] & seen 8/13- stable BPH, BOO, OAB...    DJD> on Tylenol & OTC meds as needed; c/o back pain as noted- try Tramadol prn...    Dementia> on Aricept10, Namenda10Bid, Seroquel50Bid; wife continues to c/o his behavior; we do not have any recent Neurology notes...    Anxiety> on Xanax0.25 prn use... We reviewed prob list, meds, xrays and labs> see below for updates >>   04/25/13 Acute OV  Pt complains of bilateral LE edema, rash, warm to touch, some pain x1 week.  denies weeping, f/c/s.  has been applying Neosporin w/ improvement. Lower legs have been more swollen in afternoon.  Mild redness , no significant heat or drainage.  No fever , chest pain or orthopnea.  Appetite is good, no n/v.  No new meds or  travel.          Problem List:      OBSTRUCTIVE SLEEP APNEA (ICD-327.23) - sleep study 5/03 showed RDI=27 w/ desat to 86% & mod snoring... he refused CPAP Rx, and denies daytime hypersomnolence... apparently snoring doesn't disturb wife & pt rests adeq but wakes daily at 4-5AM, "I sleep better in my recliner in front of the TV"... he does not want further intervention. ~  CXR 8/09 showed stable heart size, left subclav pacer, clear lungs, largeHH w/ A/F levels...  HYPERTENSION >> ATHEROSCLEROTIC HEART DISEASE (ICD-414.00) >> CARDIOMYOPATHY, ISCHEMIC S/P CABG (  ICD-414.8) >> on LISINOPRIL 10mg /d; followed by Ricky [name] & Graciela Husbands for Cardiology...  ~  s/p CABG 9/00 and AFlutter ablation w/ pacer in 1998... ~  8/11:  f/u by Ricky [name]- stable, no changes made... ~  2DEcho 9/11 showed mild LVH, EF=55% w/ dyssynergy c/w RV pacing, biatrial enlargem, mild RV dil & pulm HTN. ~  He continues f/u w/ Ricky [name]- 8/12 & Ricky [name]-10/12; CAD, AFib, AVjunctional ablation & pacemaker, on Coumadin; stable at last checks & no changes made... ~  He saw Ricky [name] 9/13> stable, no change in meds, he had him ret for fasting labs 10/13 as noted; EKG= pacer rhythm... ~  2/14: BP at home has been elev in the 160-170/90 range; recent eye exam & "doc was really concerned" according to the wife; pt denies HA, visual changes, CP, palpit, dizzy, syncope, SOB, edema- he remains asymptomatic; today's BP= 142/68 on Lisinopril10 & we discussed incr to 20mg /d, continue to monitor BP at home. ~  5/14: on Lisin20- BP= 140/80 & he denies CP, palpit, dizzy, SOB, edema, etc...  ATRIAL FIBRILLATION, HX OF (ICD-V12.59), AV BLOCK, COMPLETE (ICD-426.0), & PACEMAKER, PERMANENT DDD STJ (ICD-V45.01) - on COUMADIN and followed in the Coumadin Clinic...  ~  he had Ablation in the past &  a pacemaker placed for heart block by Ricky [name] who continues to follow regularly... ~  EKG 9/13 & 2/14 showed a ventric paced rhythm... ~  10/13: he had f/u  Ricky [name]> pacer checked, doing well, no changes made to the programming...   CEREBROVASCULAR DISEASE (ICD-437.9) - on ASA 81mg /d... he had a TIA in 2004 w/ carotid eval showing high grade right carotid obstruction... s/p right CAE by DrCDickson 10/04... CDopplers per [facility]... ~  CDoppler 1/09 showed 60-79% left ICA stenosis, & normal right ICA- s/p surg. ~  CDoppler 1/10 showed  patent right ICA with no evidence of restenosis, 60-79% stenosis of the left ICA. ~  CT Brain 10/10 showed atrophy & sm vessel dis, NAD. ~  CDoppler 1/11 showed patent right CAE site with no ICA stenosis, 60%-79% stenosis of the left ICA (no change from 7/10). ~  CDoppler 1/12 by [facility] showed patent right CAE, min hyperplasia w/o hemodynamic changes, 60-79% left ICA stenosis, right ECA stenosis, vertebrals antegrade. ~  CDoppler 1/14 by [facility] are identical to 1/12- no change... They rec f/u in 67mo...  HYPERCHOLESTEROLEMIA (ICD-272.0) - on SIMVASTATIN 40mg /d + low chol diet... ~  FLP 7/08 on Lip40 showed TChol 143, TG 173, HDL 40, LDL 69 ~  FLP 1/09 showed TChol 176, TG 155, HDL 42, LDL 103... Lipitor changed to Simva80 for $$$. ~  FLP 7/09 on Simva80 showed TChol 120, TG 150, HDL 42, LDL 48... rec- keep same. ~  FLP 1/10 on Simva80 showed TChol 149, TG 143, HDL 44, LDL 77 ~  9/10: Ricky [name] decr his Simva80 to Simva40- wife states "the government cut it back"... ~  FLP 10/10 on Simva40 showed TChol 183, TG 199, HDL 45, LDL 99 ~  FLP 10/11 on Simva40 showed TChol 160, TG 196, HDL 54, LDL 67 ~  FLP 4/12 on Simva40 showed TChol 153, TG 156, HDL 46, LDL 76 ~  FLP 10/12 on Simva40 showed TChol 174, TG 217, HDL 49, LDL 102 ~  FLP 10/13 on Simva40 showed TChol 177, TG 186, HDL 44, LDL 96  GASTRITIS (ICD-535.50) - on OMEPRAZOLE 20mg /d... prev treated for HPylori in 2007...he has a large HH and last EGD 6/08 by Dorris Singh revealed an esoph stricture- dilated... hosp 4/09  w/ part gastric volvulus and most of stomach in chest... EGD showed  massive HH, no lesions seen... Aug09 [name] did laparosopically reduced HH w/ repair of the hiatus & gastropexy to keep it reduced (Nissen not done).  DIVERTICULOSIS OF COLON (ICD-562.10) & COLONIC POLYPS (ICD-211.3) - 3mm adenomatous polyp removed from ascending colon in 2001... last colonoscopy by [facility] 4/04 w/ divertics + polyp... he's been eval for subseq rectal bleeding w/ fissure dx'd and Rx w/ botox 7/08 by [name]... he uses PROCTOCORT Cream Prn...  BENIGN PROSTATIC HYPERTROPHY, WITH OBSTRUCTION (ICD-600.01) - eval by DrPeterson 2010 for obstructive symptoms and hematuria (related to his coumadin)... neg cysto & started on PROSCAR 5mg /d... ~  labs 10/11 showed PSA= 1.00 ~   4/12:  new overactive bladder symptoms & we decided to try Enablex Qhs...  ~  8/12:  He had Urology f/u Ricky [name]- doing well on FINASTERIDE 5mg /d & ENABLEX7.5=> changed to TOVIAZ 8mg /d... ~  Labs here 10/12 showed PSA= 0.91 ~  Urology f/u 8/13 by Ricky [name]> BPH, BOO, OAB; they decr his Toviaz to 4mg /d... ~  2/14: presents w/ ?tinea corporis lesion on penis=> rx w/ Lotrisone cream & if unresolved we will refer to Urology...  DEGENERATIVE JOINT DISEASE (ICD-715.90)  MEMORY LOSS (ICD-780.93) - 10/10 OV wife voiced concern regarding memory loss & personality change- more argumentative, belligerent etc... MMSE showed changes and we decided to Rx w/ ARICEPT 10mg /d... ~  CT Head 10/10 showed mild atrophy & advanced chr ischemic changes, ventriculomegaly & vol loss, NAD... ~  10/11:  We added SEROQUEL 25mg  Qhs due to increased belligerence & set up Neuro referral... ~  12/11:  He had eval Ricky [name]> note reviewed, she agreed w/ Aricept, consider Namenda; she checked EEG- neg, non focal... ~  6/12:  f/u by Neurology w/ continued dementia & behavioral issues; we increased Seroquel to 50mg  & they rec adding NAMENDA 10mg Bid...  ANXIETY (ICD-300.00) - he's on ALPRAZOLAM 0.25mg  as needed.  Hx of ANEMIA (ICD-285.9) - he has  been eval by [name] w/ an iron malabsorption anemia (bone marrow in 2001 w/ absent iron stores)... prev Rx w/ parenteral iron infusions... ~  labs 10/10 by [name] showed Hg= 14.6, Fe= 107, Ferritin= 120... ~  labs here 10/11 showed Hg= 15.2, MCV= 97 > hasn't required IV Fe in over 56yrs & [name] signed off. ~  Labs here 4/12 showed Hg= 14.4 ~  Labs here 10/12 showed Hg= 15.1  SHINGLES (ICD-053.9) - presented 4/11 w/ right C2 shingles> Rx w/ ACYCLOVIR, MEDROL, VICODIN... we discussed Shingles Vaccine as well & they will think about it...  Health Maintenance ~  Immunizations:  had PNEUMOVAX 2007... received 2010 Flu shot 10/8...   Past Surgical History  Procedure Laterality Date  . Pacemaker insertion    . Coronary artery bypass graft      x4 by Dr. Sena Hitch 9/00  . Carotid endarterectomy      right 10/04 Dr. Edilia Bo  . Anal fissure repair    . Hiatal hernia repair      incarcerated stomach with takedown and repair of hiatus and gastropexy    Outpatient Encounter Prescriptions as of 04/25/2013  Medication Sig Dispense Refill  . acetaminophen (TYLENOL) 500 MG tablet Take 500 mg by mouth daily. As needed      . ALPRAZolam (XANAX) 0.25 MG tablet 1/2 tablet to 1 tablet by mouth three times a day as needed for nerves.  90 tablet  5  . clotrimazole-betamethasone (LOTRISONE) cream Apply to rash two times  daily  30 g  5  . dextromethorphan-guaiFENesin (MUCINEX DM) 30-600 MG per 12 hr tablet Take 1 tablet by mouth every 12 (twelve) hours.        Marland Kitchen donepezil (ARICEPT) 10 MG tablet Take 1 tablet (10 mg total) by mouth at bedtime as needed.  30 tablet  11  . fesoterodine (TOVIAZ) 4 MG TB24 Take 4 mg by mouth daily.      . finasteride (PROSCAR) 5 MG tablet Take 5 mg by mouth daily.        Marland Kitchen latanoprost (XALATAN) 0.005 % ophthalmic solution Place 1 drop into both eyes daily. Ordered by Dr Eulah Pont      . lisinopril (PRINIVIL,ZESTRIL) 20 MG tablet Take 1 tablet (20 mg total) by  mouth daily.  30 tablet  11  . NAMENDA XR 28 MG CP24 TAKE  ONE CAPSULE BY MOUTH EVERY MORNING  30 capsule  6  . QUEtiapine (SEROQUEL) 50 MG tablet TAKE TWO TABLETS BY MOUTH DAILY  60 tablet  6  . simvastatin (ZOCOR) 40 MG tablet Take 1 tablet (40 mg total) by mouth at bedtime.  30 tablet  6  . traMADol (ULTRAM) 50 MG tablet Take 1 tablet (50 mg total) by mouth 3 (three) times daily as needed for pain.  90 tablet  5  . warfarin (COUMADIN) 5 MG tablet Take as directed by Anticoagulation Clinic  35 tablet  3   No facility-administered encounter medications on file as of 04/25/2013.    No Known Allergies   Current Medications, Allergies, Past Medical History, Past Surgical History, Family History, and Social History were reviewed in [employer] record.    Review of Systems       See HPI - all other systems neg except as noted...  Objective:   Physical Exam     WD, Overwt, 79 y/o WM in NAD... GENERAL:  Alert, pleasant, & cooperative... HEENT:  [place]/AT, EOM-wnl, PERRLA, EACs-clear, TMs-wnl, NOSE-clear, THROAT-clear & wnl. NECK:  Supple w/ fairROM; no JVD; right carotid scar, norm impulses w/o bruits; no thyromegaly or nodules palpated; no lymphadenopathy. CHEST:  Clear to P & A; without wheezes/ rales/ or rhonchi... HEART:  Median sternotomy scar, regular rhythm; without murmurs/ rubs/ or gallops heard... ABDOMEN:  Soft & nontender; normal bowel sounds; no organomegaly or masses detected. EXT:  mild arthritic changes; no varicose veins/ +venous insuffic/ 1+ edema. NEURO:   no focal neuro deficits detected... DERM:  Stasis dermatatic changes in lower extremity    Assessment & Plan:

## 2013-04-26 LAB — CBC WITH DIFFERENTIAL/PLATELET
Basophils Absolute: 0 10*3/uL (ref 0.0–0.1)
Eosinophils Relative: 1.6 % (ref 0.0–5.0)
HCT: 44.3 % (ref 39.0–52.0)
Lymphs Abs: 2.1 10*3/uL (ref 0.7–4.0)
MCV: 98.8 fl (ref 78.0–100.0)
Monocytes Absolute: 0.5 10*3/uL (ref 0.1–1.0)
Neutrophils Relative %: 60.6 % (ref 43.0–77.0)
Platelets: 187 10*3/uL (ref 150.0–400.0)
RDW: 14 % (ref 11.5–14.6)
WBC: 7 10*3/uL (ref 4.5–10.5)

## 2013-04-30 DIAGNOSIS — I872 Venous insufficiency (chronic) (peripheral): Secondary | ICD-10-CM | POA: Insufficient documentation

## 2013-04-30 NOTE — Assessment & Plan Note (Addendum)
Venous insufficiency with edema, stasis dermatitis   Exam does not appear to be cellulitis , wbc is not elevated.  Mild fluid overload w/ nml bnp.  Will give gentle diuresis w/ stable scr on bmet.  Have him return for short term follow up   Plan   Lasix 20mg  2 tabs today then 1 tab daily until seen back in office in 1 week .  Labs today.  Low salt diet  Keep legs elevated as much as possible.  Wash legs daily and pat dry.  May apply Hydrocortisone cream to lower legs daily until seen back in office.  Please contact office for sooner follow up if symptoms do not improve or worsen or seek emergency care

## 2013-05-02 ENCOUNTER — Other Ambulatory Visit: Payer: Self-pay | Admitting: Neurology

## 2013-05-02 ENCOUNTER — Ambulatory Visit (INDEPENDENT_AMBULATORY_CARE_PROVIDER_SITE_OTHER): Payer: Medicare Other | Admitting: Adult Health

## 2013-05-02 ENCOUNTER — Other Ambulatory Visit (INDEPENDENT_AMBULATORY_CARE_PROVIDER_SITE_OTHER): Payer: Medicare Other

## 2013-05-02 ENCOUNTER — Encounter: Payer: Self-pay | Admitting: Adult Health

## 2013-05-02 ENCOUNTER — Telehealth: Payer: Self-pay | Admitting: Adult Health

## 2013-05-02 VITALS — BP 128/74 | HR 69 | Temp 98.7°F | Ht 70.0 in | Wt 213.0 lb

## 2013-05-02 DIAGNOSIS — I10 Essential (primary) hypertension: Secondary | ICD-10-CM

## 2013-05-02 DIAGNOSIS — F0281 Dementia in other diseases classified elsewhere with behavioral disturbance: Secondary | ICD-10-CM

## 2013-05-02 DIAGNOSIS — I872 Venous insufficiency (chronic) (peripheral): Secondary | ICD-10-CM

## 2013-05-02 LAB — BASIC METABOLIC PANEL
Calcium: 9.5 mg/dL (ref 8.4–10.5)
GFR: 68.54 mL/min (ref >60.00)
Glucose, Bld: 123 mg/dL — ABNORMAL HIGH (ref 70–99)
Potassium: 4.1 mEq/L (ref 3.5–5.1)
Sodium: 139 mEq/L (ref 135–145)

## 2013-05-02 NOTE — Assessment & Plan Note (Addendum)
VI with edema improved with gentle diuresis.  Check bmet today  May change to As needed  Dosing   Plan  May use Lasix 20mg  daily As needed  For leg swelling  Labs today.  Low salt diet  Keep legs elevated as much as possible.     Please contact office for sooner follow up if symptoms do not improve or worsen or seek emergency care

## 2013-05-02 NOTE — Patient Instructions (Addendum)
May use Lasix 20mg  daily As needed  For leg swelling  Labs today.  Low salt diet  Keep legs elevated as much as possible.  May take  Namenda at bedtime .  Please contact office for sooner follow up if symptoms do not improve or worsen or seek emergency care

## 2013-05-02 NOTE — Telephone Encounter (Signed)
Called and spoke with pts wife and she is aware of the dosing of the lasix.  She is to call back for any other questions or concerns.

## 2013-05-02 NOTE — Progress Notes (Signed)
Subjective:    Patient ID: Ricky [name], male    DOB: 06-02-1931, 79 y.o.   MRN: [id number]  HPI 79 y/o WM here for a follow up visit... he has multiple medical problems including:  OSA;  CAD- s/p CABG;  Hx AFib/ CHB/ Pacer;  Cerebrovasc dis- s/p RtCAE;  Hyperchol;  HH/ Gastritis/ hx gastric volvulus w/ surg2009;  Divertics/ colon polyps;  BPH w/ boo;  DJD;  Early dementia w/ personality changes;  Hx anemia w/ Fe malaborption- resolved...  ~  September 07, 2011:  73mo ROV     CAD, s/p CABG> on Lisinopril10; BP= 124/80 & he is largely asymptomatic- denies CP, palpit, SOB, edema, etc...    AFib, AVBlock, Pacer> on Coumadin via CC; [name] checked pacer 10/12 & working well, no changes made...    Cerebrovasc dis w/ right CAE 2004> he has a known 60-79% left ICA stenosis & gets CDopplers yearly (stable x several yrs); stable w/o cerebral ischemic symptoms...    Hyperlipid> on Simva40 + diet efforts; FLP shows Chol ok but TG sl elev (see below); reviewed low fat diet recs...    GI> HH, Gastritis, Divertics, Polyps> on OTC Prilosec 20mg  as needed; treated for HPylori 2007; known massive HH s/p lap-reduced HH, repair of the hiatus & gastropexy 2009 by DrMartin...    BPH & OB> on Proscar5 & Enablex7.5; seen by DrWrenn 8/12 & stable, no changes made, f/u 13yr...    DJD> he uses Tylenol for pain as needed; he has no specific complaints...    Dementia> he has a senile dementia w/ behavioral changes that are really hard on the wife; on Aricept10 & Namenda10Bid; followed by DrDohmeier for Neuro, last seen 6/12 & Namenda added...    Anxiety/ Agitation> on Alprazolam 0.25mg  Prn and Seroquel 50mg  Qhs...   ~  Mar 07, 2012:  73mo ROV & Tell has no complaints; he continues regular appts in the coumadin clinic & well regulated;  [facility] sent a memo stating that his BP was ~180/90 when they checked him but he is 136/88 today & has been well controlled on his Lisinopril 10mg /d, we decided to continue same...  We reviewed his  problems, meds, XRays, & Labs>  See prob list below>>  ~  September 07, 2012:  73mo ROV & his home BP checks have been up & down therefore reminded to take meds every day, no salt, get wt down... We reviewed the following medical problems during today's office visit>>     OSA> he refused CPAP & he denies sleeping problem, says he wakes refreshed, denies daytime sleepiness (but he's demented)...    ASHD, Cardiomyopathy> on Lisin10; followed by [name] for Cards & seen 9/13- stable, no changes made...    AFib, Heart block, Pacer> on Coumadin; followed by [name] for EP & seen 10/13- pacer function normally, no changes made...    Cerebrovasc dis> he has memory loss/ dementia; denies cerebral ischemic symptoms...    Chol> on Simva40; FLP 10/13 showed TChol 177, TG 186, HDL 44, LDL 96    GI- Gastritis, Divertics> on Omep20; denies abd pain, n/v, c/d, blood seen; last colonoscopy in 2004 w/ polyp removed...    BPH> on Toviaz4, Proscar5; followed by DrWrenn & seen 8/13- stable BPH, BOO, OAB...    DJD> on Tylenol & OTC meds as needed;     Dementia> on Aricept10, Namenda10Bid, Seroquel50Bid; wife continues to c/o his behavior- eg accused wife of stealing money; they have f/u w/ Neuro soon.Marland KitchenMarland Kitchen  Anxiety> on Xanax0.25 prn use... We reviewed prob list, meds, xrays and labs> see below for updates >> OK Flu shot today; he had TDAP in 2008; Pneumovax in 2007... LABS 10/13:  FLP- at goals on Simva40 x TG=186;  Chems- wnl;  CBC- wnl...  ~  December 28, 2012:  3-83mo ROV & add-on appt due to 2 problems:  1) they have noted that his BP at home has been elev in the 160-170/90 range; he recently had an eye exam & the eye doc was "really concerned" according to the wife; pt denies HA, visual changes, CP, palpit, dizzy, syncope, SOB, edema- he remains asymptomatic; today's BP= 142/68 on Lisinopril10 & we discussed incr to 20mg /d, continue to monitor BP at home...  2) he has noted a lesion on his penis- exam shows sm are of  irritation under the foreskin, no evid of phimosis, no exophytic lesions or ulcerated sores, no adenopathy; we decided to treat w/ Lotirsone cream & we will refer to Urology if it is unresolved... Other problems as noted- reviewed & stable...   ~  Mar 08, 2013:  2-19mo ROV & Ricky [name] says he feels well, doing well, no complaints or concerns; obvious mod dementia & wife very frustrated w/ dealing w/ him but no opportunity for help from children etc & she copes the best she can; wife indicates that he c/o back discomfort intermittently (we will Rx w/ Tramadol prn);  We reviewed the following medical problems during today's office visit >>     HBP, ASHD, Cardiomyopathy> on Lisin20 now; followed by [name] for Cards & seen 2/14- stable, no changes made...    AFib, Heart block, Pacer> on Coumadin; followed by [name] for EP & seen 10/13- pacer function normally, no changes made...    Cerebrovasc dis> on ASA81; HxTIA 2004 w/ right CAE; last CDoppler 1/14 by [facility] showed patent right CAE, 60-79% LICAstenosis unchanged; denies cerebral ischemic symptoms...    Chol> on Simva40; FLP 10/13 showed TChol 177, TG 186, HDL 44, LDL 96; we reviewed diet recommendations...    BPH> on Toviaz4, Proscar5; followed by DrWrenn & seen 8/13- stable BPH, BOO, OAB...    DJD> on Tylenol & OTC meds as needed; c/o back pain as noted- try Tramadol prn...    Dementia> on Aricept10, Namenda10Bid, Seroquel50Bid; wife continues to c/o his behavior; we do not have any recent Neurology notes...    Anxiety> on Xanax0.25 prn use... We reviewed prob list, meds, xrays and labs> see below for updates >>   04/25/13 Acute OV  Pt complains of bilateral LE edema, rash, warm to touch, some pain x1 week.  denies weeping, f/c/s.  has been applying Neosporin w/ improvement. Lower legs have been more swollen in afternoon.  Mild redness , no significant heat or drainage.  No fever , chest pain or orthopnea.  Appetite is good, no n/v.  No new meds or  travel.  >>lasix 20mg  daily   05/02/2013 Follow up  Returns for follow up for venous insufficiency w/ edema. He was seen last week with LE edema w/ stasis dermatitis. Started on lasix. Labs were unrevealing w/ nml bnp, and wbc.  Returns today improved with almost total resolution of edema. Redness is gone. Used otc hydrocortisone briefly.  No fever, chest pain, orthopnea, back pain.  Does complains of nausea over last few weeks. No vomiting , chest pain , bloody stools or urinary symptoms. He was changed over to Namenda XR 1 month ago.  Problem List:      OBSTRUCTIVE SLEEP APNEA (ICD-327.23) - sleep study 5/03 showed RDI=27 w/ desat to 86% & mod snoring... he refused CPAP Rx, and denies daytime hypersomnolence... apparently snoring doesn't disturb wife & pt rests adeq but wakes daily at 4-5AM, "I sleep better in my recliner in front of the TV"... he does not want further intervention. ~  CXR 8/09 showed stable heart size, left subclav pacer, clear lungs, largeHH w/ A/F levels...  HYPERTENSION >> ATHEROSCLEROTIC HEART DISEASE (ICD-414.00) >> CARDIOMYOPATHY, ISCHEMIC S/P CABG (ICD-414.8) >> on LISINOPRIL 10mg /d; followed by [name] Graciela Husbands for Cardiology...  ~  s/p CABG 9/00 and AFlutter ablation w/ pacer in 1998... ~  8/11:  f/u by [name]- stable, no changes made... ~  2DEcho 9/11 showed mild LVH, EF=55% w/ dyssynergy c/w RV pacing, biatrial enlargem, mild RV dil & pulm HTN. ~  He continues f/u w/ [name]- 8/12 & [name]-10/12; CAD, AFib, AVjunctional ablation & pacemaker, on Coumadin; stable at last checks & no changes made... ~  He saw [name] 9/13> stable, no change in meds, he had him ret for fasting labs 10/13 as noted; EKG= pacer rhythm... ~  2/14: BP at home has been elev in the 160-170/90 range; recent eye exam & "doc was really concerned" according to the wife; pt denies HA, visual changes, CP, palpit, dizzy, syncope, SOB, edema- he remains asymptomatic; today's BP=  142/68 on Lisinopril10 & we discussed incr to 20mg /d, continue to monitor BP at home. ~  5/14: on Lisin20- BP= 140/80 & he denies CP, palpit, dizzy, SOB, edema, etc...  ATRIAL FIBRILLATION, HX OF (ICD-V12.59), AV BLOCK, COMPLETE (ICD-426.0), & PACEMAKER, PERMANENT DDD STJ (ICD-V45.01) - on COUMADIN and followed in the Coumadin Clinic...  ~  he had Ablation in the past &  a pacemaker placed for heart block by [name] who continues to follow regularly... ~  EKG 9/13 & 2/14 showed a ventric paced rhythm... ~  10/13: he had f/u [name]> pacer checked, doing well, no changes made to the programming...   CEREBROVASCULAR DISEASE (ICD-437.9) - on ASA 81mg /d... he had a TIA in 2004 w/ carotid eval showing high grade right carotid obstruction... s/p right CAE by DrCDickson 10/04... CDopplers per [facility]... ~  CDoppler 1/09 showed 60-79% left ICA stenosis, & normal right ICA- s/p surg. ~  CDoppler 1/10 showed  patent right ICA with no evidence of restenosis, 60-79% stenosis of the left ICA. ~  CT Brain 10/10 showed atrophy & sm vessel dis, NAD. ~  CDoppler 1/11 showed patent right CAE site with no ICA stenosis, 60%-79% stenosis of the left ICA (no change from 7/10). ~  CDoppler 1/12 by [facility] showed patent right CAE, min hyperplasia w/o hemodynamic changes, 60-79% left ICA stenosis, right ECA stenosis, vertebrals antegrade. ~  CDoppler 1/14 by [facility] are identical to 1/12- no change... They rec f/u in 59mo...  HYPERCHOLESTEROLEMIA (ICD-272.0) - on SIMVASTATIN 40mg /d + low chol diet... ~  FLP 7/08 on Lip40 showed TChol 143, TG 173, HDL 40, LDL 69 ~  FLP 1/09 showed TChol 176, TG 155, HDL 42, LDL 103... Lipitor changed to Simva80 for $$$. ~  FLP 7/09 on Simva80 showed TChol 120, TG 150, HDL 42, LDL 48... rec- keep same. ~  FLP 1/10 on Simva80 showed TChol 149, TG 143, HDL 44, LDL 77 ~  9/10: [name] decr his Simva80 to Simva40- wife states "the government cut it back"... ~  FLP 10/10 on Simva40 showed TChol 183, TG  199, HDL  45, LDL 99 ~  FLP 10/11 on Simva40 showed TChol 160, TG 196, HDL 54, LDL 67 ~  FLP 4/12 on Simva40 showed TChol 153, TG 156, HDL 46, LDL 76 ~  FLP 10/12 on Simva40 showed TChol 174, TG 217, HDL 49, LDL 102 ~  FLP 10/13 on Simva40 showed TChol 177, TG 186, HDL 44, LDL 96  GASTRITIS (ICD-535.50) - on OMEPRAZOLE 20mg /d... prev treated for HPylori in 2007...he has a large HH and last EGD 6/08 by Dorris Singh revealed an esoph stricture- dilated... hosp 4/09 w/ part gastric volvulus and most of stomach in chest... EGD showed massive HH, no lesions seen... Aug09 [name] did laparosopically reduced HH w/ repair of the hiatus & gastropexy to keep it reduced (Nissen not done).  DIVERTICULOSIS OF COLON (ICD-562.10) & COLONIC POLYPS (ICD-211.3) - 3mm adenomatous polyp removed from ascending colon in 2001... last colonoscopy by [facility] 4/04 w/ divertics + polyp... he's been eval for subseq rectal bleeding w/ fissure dx'd and Rx w/ botox 7/08 by [name]... he uses PROCTOCORT Cream Prn...  BENIGN PROSTATIC HYPERTROPHY, WITH OBSTRUCTION (ICD-600.01) - eval by DrPeterson 2010 for obstructive symptoms and hematuria (related to his coumadin)... neg cysto & started on PROSCAR 5mg /d... ~  labs 10/11 showed PSA= 1.00 ~   4/12:  new overactive bladder symptoms & we decided to try Enablex Qhs...  ~  8/12:  He had Urology f/u DrWrenn- doing well on FINASTERIDE 5mg /d & ENABLEX7.5=> changed to TOVIAZ 8mg /d... ~  Labs here 10/12 showed PSA= 0.91 ~  Urology f/u 8/13 by DrWrenn> BPH, BOO, OAB; they decr his Toviaz to 4mg /d... ~  2/14: presents w/ ?tinea corporis lesion on penis=> rx w/ Lotrisone cream & if unresolved we will refer to Urology...  DEGENERATIVE JOINT DISEASE (ICD-715.90)  MEMORY LOSS (ICD-780.93) - 10/10 OV wife voiced concern regarding memory loss & personality change- more argumentative, belligerent etc... MMSE showed changes and we decided to Rx w/ ARICEPT 10mg /d... ~  CT Head 10/10 showed mild  atrophy & advanced chr ischemic changes, ventriculomegaly & vol loss, NAD... ~  10/11:  We added SEROQUEL 25mg  Qhs due to increased belligerence & set up Neuro referral... ~  12/11:  He had eval DrDohmeier> note reviewed, she agreed w/ Aricept, consider Namenda; she checked EEG- neg, non focal... ~  6/12:  f/u by Neurology w/ continued dementia & behavioral issues; we increased Seroquel to 50mg  & they rec adding NAMENDA 10mg Bid...  ANXIETY (ICD-300.00) - he's on ALPRAZOLAM 0.25mg  as needed.  Hx of ANEMIA (ICD-285.9) - he has been eval by [name] w/ an iron malabsorption anemia (bone marrow in 2001 w/ absent iron stores)... prev Rx w/ parenteral iron infusions... ~  labs 10/10 by [name] showed Hg= 14.6, Fe= 107, Ferritin= 120... ~  labs here 10/11 showed Hg= 15.2, MCV= 97 > hasn't required IV Fe in over 33yrs & [name] signed off. ~  Labs here 4/12 showed Hg= 14.4 ~  Labs here 10/12 showed Hg= 15.1  SHINGLES (ICD-053.9) - presented 4/11 w/ right C2 shingles> Rx w/ ACYCLOVIR, MEDROL, VICODIN... we discussed Shingles Vaccine as well & they will think about it...  Health Maintenance ~  Immunizations:  had PNEUMOVAX 2007... received 2010 Flu shot 10/8...   Past Surgical History  Procedure Laterality Date  . Pacemaker insertion    . Coronary artery bypass graft      x4 by Dr. Sena Hitch 9/00  . Carotid endarterectomy      right 10/04 Dr. Edilia Bo  . Anal fissure repair    .  Hiatal hernia repair      incarcerated stomach with takedown and repair of hiatus and gastropexy    Outpatient Encounter Prescriptions as of 05/02/2013  Medication Sig Dispense Refill  . acetaminophen (TYLENOL) 500 MG tablet Take 500 mg by mouth daily. As needed      . ALPRAZolam (XANAX) 0.25 MG tablet 1/2 tablet to 1 tablet by mouth three times a day as needed for nerves.  90 tablet  5  . clotrimazole-betamethasone (LOTRISONE) cream Apply to rash two times daily  30 g  5  .  dextromethorphan-guaiFENesin (MUCINEX DM) 30-600 MG per 12 hr tablet Take 1 tablet by mouth every 12 (twelve) hours.        Marland Kitchen donepezil (ARICEPT) 10 MG tablet Take 1 tablet (10 mg total) by mouth at bedtime as needed.  30 tablet  11  . fesoterodine (TOVIAZ) 4 MG TB24 Take 4 mg by mouth daily.      . finasteride (PROSCAR) 5 MG tablet Take 5 mg by mouth daily.        . furosemide (LASIX) 20 MG tablet Take 1 tablet (20 mg total) by mouth daily as needed.  30 tablet  2  . latanoprost (XALATAN) 0.005 % ophthalmic solution Place 1 drop into both eyes daily. Ordered by Dr Eulah Pont      . lisinopril (PRINIVIL,ZESTRIL) 20 MG tablet Take 1 tablet (20 mg total) by mouth daily.  30 tablet  11  . NAMENDA XR 28 MG CP24 TAKE  ONE CAPSULE BY MOUTH EVERY MORNING  30 capsule  6  . QUEtiapine (SEROQUEL) 50 MG tablet TAKE TWO TABLETS BY MOUTH DAILY  60 tablet  6  . simvastatin (ZOCOR) 40 MG tablet Take 1 tablet (40 mg total) by mouth at bedtime.  30 tablet  6  . traMADol (ULTRAM) 50 MG tablet Take 1 tablet (50 mg total) by mouth 3 (three) times daily as needed for pain.  90 tablet  5  . warfarin (COUMADIN) 5 MG tablet Take as directed by Anticoagulation Clinic  35 tablet  3   No facility-administered encounter medications on file as of 05/02/2013.    No Known Allergies   Current Medications, Allergies, Past Medical History, Past Surgical History, Family History, and Social History were reviewed in [employer] record.    Review of Systems       See HPI - all other systems neg except as noted...  Objective:   Physical Exam     WD, Overwt, 79 y/o WM in NAD... GENERAL:  Alert, pleasant, & cooperative... HEENT:  [place]/AT, EOM-wnl, PERRLA, EACs-clear, TMs-wnl, NOSE-clear, THROAT-clear & wnl. NECK:  Supple w/ fairROM; no JVD; right carotid scar, norm impulses w/o bruits; no thyromegaly or nodules palpated; no lymphadenopathy. CHEST:  Clear to P & A; without wheezes/ rales/ or  rhonchi... HEART:  Median sternotomy scar, regular rhythm; without murmurs/ rubs/ or gallops heard... ABDOMEN:  Soft & nontender; normal bowel sounds; no organomegaly or masses detected., no guarding or rebound.  EXT:  mild arthritic changes; no varicose veins/ +venous insuffic/ none to tr edema. NEURO:   no focal neuro deficits detected... DERM:  Improved mild stasis dermatatic changes in lower extremity    Assessment & Plan:

## 2013-05-02 NOTE — Assessment & Plan Note (Signed)
Controlled on rx  May change XR namenda to bedtime dosing ? Causing nausea

## 2013-05-03 DIAGNOSIS — I495 Sick sinus syndrome: Secondary | ICD-10-CM

## 2013-05-08 NOTE — Progress Notes (Signed)
Quick Note:  Called spoke with patient's spouse. Advised of lab results / recs as stated by TP. Spouse verbalized understanding and denied any questions. ______ 

## 2013-05-27 ENCOUNTER — Ambulatory Visit (INDEPENDENT_AMBULATORY_CARE_PROVIDER_SITE_OTHER): Payer: Medicare Other | Admitting: *Deleted

## 2013-05-27 DIAGNOSIS — I4891 Unspecified atrial fibrillation: Secondary | ICD-10-CM

## 2013-05-27 DIAGNOSIS — Z7901 Long term (current) use of anticoagulants: Secondary | ICD-10-CM

## 2013-05-27 DIAGNOSIS — I679 Cerebrovascular disease, unspecified: Secondary | ICD-10-CM

## 2013-05-27 LAB — POCT INR: INR: 1.7

## 2013-05-29 ENCOUNTER — Ambulatory Visit: Payer: Medicare Other | Admitting: Neurosurgery

## 2013-05-29 ENCOUNTER — Other Ambulatory Visit: Payer: Medicare Other

## 2013-05-31 ENCOUNTER — Other Ambulatory Visit: Payer: Self-pay | Admitting: *Deleted

## 2013-05-31 ENCOUNTER — Ambulatory Visit (INDEPENDENT_AMBULATORY_CARE_PROVIDER_SITE_OTHER): Payer: Medicare Other

## 2013-05-31 ENCOUNTER — Other Ambulatory Visit (INDEPENDENT_AMBULATORY_CARE_PROVIDER_SITE_OTHER): Payer: Medicare Other | Admitting: *Deleted

## 2013-05-31 ENCOUNTER — Telehealth: Payer: Self-pay | Admitting: *Deleted

## 2013-05-31 ENCOUNTER — Other Ambulatory Visit: Payer: Self-pay

## 2013-05-31 VITALS — BP 140/72 | HR 70 | Ht 72.0 in | Wt 214.1 lb

## 2013-05-31 DIAGNOSIS — R079 Chest pain, unspecified: Secondary | ICD-10-CM

## 2013-05-31 DIAGNOSIS — Z48812 Encounter for surgical aftercare following surgery on the circulatory system: Secondary | ICD-10-CM

## 2013-05-31 DIAGNOSIS — I6529 Occlusion and stenosis of unspecified carotid artery: Secondary | ICD-10-CM

## 2013-05-31 LAB — TROPONIN I: Troponin I: <0.3 ng/mL (ref <0.30)

## 2013-05-31 NOTE — Telephone Encounter (Signed)
Advised wife of labs 

## 2013-05-31 NOTE — Telephone Encounter (Signed)
Message copied by Burnell Blanks on Fri May 31, 2013  5:01 PM ------      Message from: Cassell Clement      Created: Fri May 31, 2013  4:57 PM       Please report.  The troponin level did not show any evidence of myocardial injury.  Continue current medication. ------

## 2013-05-31 NOTE — Progress Notes (Signed)
Pt walked in office with chest pain.Patient stated he woke up this morning with rt sided chest pain.Stated the pain is a # 5.EKG was done and shown to DOD Dr.Brackbill.Dr.Brackbill advised to have a troponin level drawn.Appointment scheduled with Norma Fredrickson NP 06/12/13.Advised to go to ER if needed.

## 2013-06-04 ENCOUNTER — Encounter: Payer: Self-pay | Admitting: Vascular Surgery

## 2013-06-05 ENCOUNTER — Ambulatory Visit (INDEPENDENT_AMBULATORY_CARE_PROVIDER_SITE_OTHER): Payer: Medicare Other | Admitting: Vascular Surgery

## 2013-06-05 ENCOUNTER — Encounter: Payer: Self-pay | Admitting: Vascular Surgery

## 2013-06-05 DIAGNOSIS — I6529 Occlusion and stenosis of unspecified carotid artery: Secondary | ICD-10-CM

## 2013-06-05 DIAGNOSIS — R079 Chest pain, unspecified: Secondary | ICD-10-CM

## 2013-06-05 NOTE — Progress Notes (Signed)
[facility] [place]  Patient name: Ricky [name] MRN: [id number] DOB: 11/05/1931 Sex: male  REASON FOR VISIT: rate of 80% left carotid stenosis.  HPI: Ricky [name] is a 79 y.o. male was here for a routine carotid duplex scan last week. He has undergone previous right carotid endarterectomy in 2004. The right carotid endarterectomy site was widely patent. The carotid stenosis on the left however it progressed to greater than 80%. Previous study 6 months ago which showed a 60-79% left carotid stenosis. The carotid stenosis was asymptomatic. The patient had been having chest pain so he was to go to the cardiologist office for evaluation. He comes back today to discuss these results.  States that his chest pain resolved and he is had no further episodes of chest pain. He is scheduled to see [place] cardiology on 06/12/2013. Of note he is on Coumadin for a history of atrial fibrillation. He denies any history of stroke, TIAs, expressive or receptive aphasia, or amaurosis fugax. Of note he does have some history of dementia. He is not on aspirin. He is on a statin.   Past Medical History  Diagnosis Date  . Obstructive sleep apnea   . Ischemic cardiomyopathy   . Atrial fibrillation   . AV block, complete s/p ablation   . Pacemaker [employer]   . Cerebrovascular disease   . Hypercholesterolemia   . Hiatal hernia   . Gastritis   . Diverticulosis   . Colonic polyp   . BPH (benign prostatic hypertrophy)   . DJD (degenerative joint disease)   . Memory loss   . Anxiety   . Anemia   . Shingles   . Carotid artery occlusion    Family History  Problem Relation Age of Onset  . Coronary artery disease Other    For VQI Use Only  PRE-ADM LIVING: [X ] Home, [ ] Nursing home, [ ] Homeless  AMB STATUS: [X ] Walking, [ ] Walking w/ Assistance, [ ] Wheelchair, [ ]Bed ridden  RECENT HEART ATTACK (<6 mon): No  CAD Sx: [ ] No, [ ] Asx, h/o MI, [X ] Stable angina, [ ] Unstable  angina  PRIOR CHF: [X ] No, [ ] Asx, [ ] Mild, [ ] Moderate, [ ] Severe  STRESS TEST: [X ] No, [ ] Normal, [ ] + ischemia, [ ] + MI, [ ] Both  SOCIAL HISTORY: History  Substance Use Topics  . Smoking status: Former Smoker -- 1.50 packs/day for 50 years    Types: Cigarettes    Quit date: 11/08/1995  . Smokeless tobacco: Never Used  . Alcohol Use: No     Comment: quit drinking back in 1977   No Known Allergies  Current Outpatient Prescriptions  Medication Sig Dispense Refill  . acetaminophen (TYLENOL) 500 MG tablet Take 500 mg by mouth daily. As needed      . ALPRAZolam (XANAX) 0.25 MG tablet 1/2 tablet to 1 tablet by mouth three times a day as needed for nerves.  90 tablet  5  . clotrimazole-betamethasone (LOTRISONE) cream Apply to rash two times daily  30 g  5  . dextromethorphan-guaiFENesin (MUCINEX DM) 30-600 MG per 12 hr tablet Take 1 tablet by mouth every 12 (twelve) hours.        . donepezil (ARICEPT) 10 MG tablet TAKE 1 TABLET BY MOUTH DAILY - MAY TAKE IN MORNING  90 tablet  1  . fesoterodine (TOVIAZ) 4 MG TB24 Take 4 mg by   mouth daily.      . finasteride (PROSCAR) 5 MG tablet Take 5 mg by mouth daily.        . furosemide (LASIX) 20 MG tablet Take 1 tablet (20 mg total) by mouth daily as needed.  30 tablet  2  . latanoprost (XALATAN) 0.005 % ophthalmic solution Place 1 drop into both eyes daily. Ordered by Dr Cashwell      . lisinopril (PRINIVIL,ZESTRIL) 20 MG tablet Take 1 tablet (20 mg total) by mouth daily.  30 tablet  11  . NAMENDA XR 28 MG CP24 TAKE  ONE CAPSULE BY MOUTH EVERY MORNING  30 capsule  6  . QUEtiapine (SEROQUEL) 50 MG tablet TAKE TWO TABLETS BY MOUTH DAILY  180 tablet  1  . simvastatin (ZOCOR) 40 MG tablet Take 1 tablet (40 mg total) by mouth at bedtime.  30 tablet  6  . traMADol (ULTRAM) 50 MG tablet Take 1 tablet (50 mg total) by mouth 3 (three) times daily as needed for pain.  90 tablet  5  . warfarin (COUMADIN) 5 MG tablet Take as directed by  Anticoagulation Clinic  35 tablet  3   No current facility-administered medications for this visit.   REVIEW OF SYSTEMS: [X ] denotes positive finding; [  ] denotes negative finding CARDIOVASCULAR:  [X ] chest pain last week.   [ ] chest pressure   [ ] palpitations   [ ] orthopnea   [ ] dyspnea on exertion   [ ] claudication   [ ] rest pain   [ ] DVT   [ ] phlebitis PULMONARY:   [ ] productive cough   [ ] asthma   [ ] wheezing NEUROLOGIC:   [ ] weakness  [ ] paresthesias  [ ] aphasia  [ ] amaurosis  [ ] dizziness HEMATOLOGIC:   [ ] bleeding problems   [ ] clotting disorders MUSCULOSKELETAL:  [ ] joint pain   [ ] joint swelling [ ] leg swelling GASTROINTESTINAL: [ ]  blood in stool  [ ]  hematemesis GENITOURINARY:  [ ]  dysuria  [ ]  hematuria PSYCHIATRIC:  [ ] history of major depression INTEGUMENTARY:  [ ] rashes  [ ] ulcers CONSTITUTIONAL:  [ ] fever   [ ] chills  PHYSICAL EXAM: Filed Vitals:   06/05/13 1406 06/05/13 1407  BP: 172/88 154/79  Pulse: 70 78  Resp: 16   Height: 6' (1.829 m)   Weight: 215 lb (97.523 kg)   SpO2: 97%    Body mass index is 29.15 kg/(m^2). GENERAL: The patient is a well-nourished male, in no acute distress. The vital signs are documented above. CARDIOVASCULAR: He has an irregular rhythm.. I do not detect carotid bruits. PULMONARY: There is good air exchange bilaterally without wheezing or rales. ABDOMEN: Soft and non-tender with normal pitched bowel sounds.  MUSCULOSKELETAL: There are no major deformities or cyanosis. NEUROLOGIC: No focal weakness or paresthesias are detected. SKIN: There are no ulcers or rashes noted. PSYCHIATRIC: The patient has a normal affect.  DATA:  Lab Results  Component Value Date   WBC 7.0 04/25/2013   HGB 14.5 04/25/2013   HCT 44.3 04/25/2013   MCV 98.8 04/25/2013   PLT 187.0 04/25/2013   Lab Results  Component Value Date   CREATININE 1.1 05/02/2013   Lab Results  Component Value Date   INR 1.7 05/27/2013   INR 2.2  04/15/2013   INR 2.3 03/11/2013   PROTIME 18.8 04/17/2009     CAROTID DUPLEX: I have reviewed his carotid duplex scan which shows a very tight left carotid stenosis. This is greater than 80%. The peak systolic velocity of 676 cm/s with end-diastolic velocity 175 cm/s. He has no evidence of recurrent carotid stenosis on the right. Both vertebral arteries are patent with antegrade flow.  MEDICAL ISSUES:  GREATER THAN 80% LEFT CAROTID STENOSIS (ASYMPTOMATIC): This patient has a very tight left carotid stenosis which is asymptomatic. I've explained that we can lower his risk of future stroke with elective left carotid endarterectomy. Patient is 81 with multiple medical comorbidities and some underlying dementia. Thus he is not an ideal candidate for carotid endarterectomy. He is scheduled to see [place] cardiology next week given his recent history of chest pain. The patient at this point is not sure if he wants to proceed with surgery explained that I do not think it is unreasonable if he elects not to proceed with elective left carotid endarterectomy given that I think he would be at increased risk because of his comorbidities. However if he is felt to be a reasonable candidate from a cardiac standpoint, then I think we could consider left carotid endarterectomy. We would have to stop his Coumadin 5 days preoperatively. This was discussed with the patient and his wife today.  Ioan Landini S [facility] [place] Beeper: [phone]   

## 2013-06-12 ENCOUNTER — Ambulatory Visit (INDEPENDENT_AMBULATORY_CARE_PROVIDER_SITE_OTHER): Payer: Medicare Other | Admitting: Nurse Practitioner

## 2013-06-12 ENCOUNTER — Encounter: Payer: Self-pay | Admitting: Nurse Practitioner

## 2013-06-12 VITALS — BP 150/70 | HR 68 | Ht 72.0 in | Wt 214.0 lb

## 2013-06-12 DIAGNOSIS — I2510 Atherosclerotic heart disease of native coronary artery without angina pectoris: Secondary | ICD-10-CM

## 2013-06-12 DIAGNOSIS — Z01810 Encounter for preprocedural cardiovascular examination: Secondary | ICD-10-CM

## 2013-06-12 DIAGNOSIS — I4891 Unspecified atrial fibrillation: Secondary | ICD-10-CM

## 2013-06-12 NOTE — Patient Instructions (Addendum)
We will get a stress test Eugenie Birks)  I will send Dr. Edilia Bo a note about you today  Stay on your current medicines  See Dr. Graciela Husbands in October for your regular follow up  Call the [facility] office at [phone] if you have any questions, problems or concerns.

## 2013-06-12 NOTE — Progress Notes (Signed)
Ricky [name] Date of Birth: 11-Oct-1931 Medical Record #[id number]  History of Present Illness: Ricky [name] is seen back today for a follow up visit. Former patient of Dr. Rosalyn Charters. He is 79 years of age. Has known CAD with past CABG in 2000 per Dr. Tyrone Sage, AF, HTN, HLD, PPM in place, memory disorder, anxiety, BPH, hiatal hernia and carotid disease with prior R CEA.   Last seen back in February. Seemed to be doing ok.   He was a "walk in" at the end of July with chest pain. EKG was paced.   Now with greater than 80% left carotid stenosis - considering CEA with Dr. Edilia Bo. Noted in Dr. Adele Dan note. With his multiple issues, not felt to be a great candidate for surgery and the patient reportedly considering not having. He remains on coumadin.   Comes back today. Here with his wife. Doing ok. He says he feels fine. Denies chest pain. Not short of breath. No passing out spells reported. Has chronic edema. His wife tells me that the day he came up here with chest pain she thought he got "scared". He says he is now going to have his carotid surgery. He remains demented. He is still driving. Apparently he can get pretty ugly. Their daughter has died this past 05/16/2023 and the wife now has really no help.   Current Outpatient Prescriptions  Medication Sig Dispense Refill  . acetaminophen (TYLENOL) 500 MG tablet Take 500 mg by mouth daily. As needed      . ALPRAZolam (XANAX) 0.25 MG tablet 1/2 tablet to 1 tablet by mouth three times a day as needed for nerves.  90 tablet  5  . clotrimazole-betamethasone (LOTRISONE) cream Apply to rash two times daily  30 g  5  . dextromethorphan-guaiFENesin (MUCINEX DM) 30-600 MG per 12 hr tablet Take 1 tablet by mouth every 12 (twelve) hours.        Marland Kitchen donepezil (ARICEPT) 10 MG tablet TAKE 1 TABLET BY MOUTH DAILY - MAY TAKE IN MORNING  90 tablet  1  . fesoterodine (TOVIAZ) 4 MG TB24 Take 4 mg by mouth daily.      . finasteride (PROSCAR) 5 MG tablet Take 5 mg by  mouth daily.        Marland Kitchen latanoprost (XALATAN) 0.005 % ophthalmic solution Place 1 drop into both eyes daily. Ordered by Dr Eulah Pont      . lisinopril (PRINIVIL,ZESTRIL) 20 MG tablet Take 1 tablet (20 mg total) by mouth daily.  30 tablet  11  . NAMENDA XR 28 MG CP24 TAKE  ONE CAPSULE BY MOUTH EVERY MORNING  30 capsule  6  . QUEtiapine (SEROQUEL) 50 MG tablet TAKE TWO TABLETS BY MOUTH DAILY  180 tablet  1  . simvastatin (ZOCOR) 40 MG tablet Take 1 tablet (40 mg total) by mouth at bedtime.  30 tablet  6  . traMADol (ULTRAM) 50 MG tablet Take 1 tablet (50 mg total) by mouth 3 (three) times daily as needed for pain.  90 tablet  5  . warfarin (COUMADIN) 5 MG tablet Take as directed by Anticoagulation Clinic  35 tablet  3   No current facility-administered medications for this visit.    No Known Allergies  Past Medical History  Diagnosis Date  . Obstructive sleep apnea   . Ischemic cardiomyopathy   . Atrial fibrillation   . AV block, complete s/p ablation   . Pacemaker [place]   . Cerebrovascular disease   . Hypercholesterolemia   .  Hiatal hernia   . Gastritis   . Diverticulosis   . Colonic polyp   . BPH (benign prostatic hypertrophy)   . DJD (degenerative joint disease)   . Memory loss   . Anxiety   . Anemia   . Shingles   . Carotid artery occlusion     Past Surgical History  Procedure Laterality Date  . Pacemaker insertion    . Coronary artery bypass graft      x4 by Dr. Sena Hitch 9/00  . Carotid endarterectomy      right 10/04 Dr. Edilia Bo  . Anal fissure repair    . Hiatal hernia repair      incarcerated stomach with takedown and repair of hiatus and gastropexy    History  Smoking status  . Former Smoker -- 1.50 packs/day for 50 years  . Types: Cigarettes  . Quit date: 11/08/1995  Smokeless tobacco  . Never Used    History  Alcohol Use No    Comment: quit drinking back in 1977    Family History  Problem Relation Age of Onset  . Coronary artery disease Other      Review of Systems: The review of systems is per the HPI.  All other systems were reviewed and are negative.  Physical Exam: BP 150/70  Pulse 68  Ht 6' (1.829 m)  Wt 214 lb (97.07 kg)  BMI 29.02 kg/m2 Patient is alert and in no acute distress. Not able to provide a history - looks to his wife for all the answers to my questions. Skin is warm and dry. Color is normal.  HEENT is unremarkable. Normocephalic/atraumatic. PERRL. Sclera are nonicteric. Neck is supple. No masses. No JVD. Lungs are clear. Cardiac exam shows a regular rate and rhythm. Abdomen is soft. Extremities are without edema. Gait and ROM are intact. No gross neurologic deficits noted.  LABORATORY DATA: EKG from July showed V pacing.    Lab Results  Component Value Date   WBC 7.0 04/25/2013   HGB 14.5 04/25/2013   HCT 44.3 04/25/2013   PLT 187.0 04/25/2013   GLUCOSE 123* 05/02/2013   CHOL 177 08/31/2012   TRIG 186.0* 08/31/2012   HDL 43.70 08/31/2012   LDLDIRECT 102.1 09/07/2011   LDLCALC 96 08/31/2012   ALT 14 08/31/2012   AST 22 08/31/2012   NA 139 05/02/2013   K 4.1 05/02/2013   CL 102 05/02/2013   CREATININE 1.1 05/02/2013   BUN 20 05/02/2013   CO2 28 05/02/2013   TSH 1.59 02/14/2011   PSA 0.91 09/07/2011   INR 1.7 05/27/2013   Lab Results  Component Value Date   TROPONINI <0.30 05/31/2013     Assessment / Plan: 1. CAD - reportedly with chest pain - not really able to get a history given his dementia. He is quite inactive. With upcoming surgery and history of his CABG back in 2000 - will update his Myoview.   2. Dementia - this seems to be his most pressing issue - unfortunately his wife is bearing the brunt of this as the caregiver.   3. PVD - with progressive carotid disease - contemplating surgery with CEA.   I am in agreement with Dr. Edilia Bo that with his multiple issues he is at increased risk for any type of procedure. He seems to be most limited by his dementia. Ricky [name] says he is going to have the  surgery and it appears that the wife is going to let him make this decision. We will see  what the Eugenie Birks shows. Further disposition to follow. Very sad situation overall with poor prognosis.   Patient is agreeable to this plan and will call if any problems develop in the interim.   Rosalio Macadamia, RN, ANP-C [facility] [address] [place], [place]  [address]

## 2013-06-17 ENCOUNTER — Ambulatory Visit (INDEPENDENT_AMBULATORY_CARE_PROVIDER_SITE_OTHER): Payer: Medicare Other | Admitting: Pharmacist

## 2013-06-17 ENCOUNTER — Ambulatory Visit (HOSPITAL_COMMUNITY): Payer: Medicare Other | Attending: Internal Medicine | Admitting: Radiology

## 2013-06-17 VITALS — BP 143/79 | Ht 72.0 in | Wt 210.0 lb

## 2013-06-17 DIAGNOSIS — Z950 Presence of cardiac pacemaker: Secondary | ICD-10-CM | POA: Insufficient documentation

## 2013-06-17 DIAGNOSIS — R079 Chest pain, unspecified: Secondary | ICD-10-CM

## 2013-06-17 DIAGNOSIS — I2510 Atherosclerotic heart disease of native coronary artery without angina pectoris: Secondary | ICD-10-CM

## 2013-06-17 DIAGNOSIS — Z87891 Personal history of nicotine dependence: Secondary | ICD-10-CM | POA: Insufficient documentation

## 2013-06-17 DIAGNOSIS — I679 Cerebrovascular disease, unspecified: Secondary | ICD-10-CM

## 2013-06-17 DIAGNOSIS — I6529 Occlusion and stenosis of unspecified carotid artery: Secondary | ICD-10-CM | POA: Insufficient documentation

## 2013-06-17 DIAGNOSIS — Z7901 Long term (current) use of anticoagulants: Secondary | ICD-10-CM

## 2013-06-17 DIAGNOSIS — I10 Essential (primary) hypertension: Secondary | ICD-10-CM | POA: Insufficient documentation

## 2013-06-17 DIAGNOSIS — Z01810 Encounter for preprocedural cardiovascular examination: Secondary | ICD-10-CM

## 2013-06-17 DIAGNOSIS — I4891 Unspecified atrial fibrillation: Secondary | ICD-10-CM

## 2013-06-17 MED ORDER — TECHNETIUM TC 99M SESTAMIBI GENERIC - CARDIOLITE
11.0000 | Freq: Once | INTRAVENOUS | Status: AC | PRN
  Administered 2013-06-17: 11 via INTRAVENOUS

## 2013-06-17 MED ORDER — ADENOSINE (DIAGNOSTIC) 3 MG/ML IV SOLN
0.5600 mg/kg | Freq: Once | INTRAVENOUS | Status: AC
  Administered 2013-06-17: 53.4 mg via INTRAVENOUS

## 2013-06-17 MED ORDER — TECHNETIUM TC 99M SESTAMIBI GENERIC - CARDIOLITE
33.0000 | Freq: Once | INTRAVENOUS | Status: AC | PRN
  Administered 2013-06-17: 33 via INTRAVENOUS

## 2013-06-17 NOTE — Progress Notes (Signed)
  [facility] SITE 3 NUCLEAR MED [address] [place], [place] [address] [phone]    Cardiology Nuclear Med Study  Ricky [name] is a 79 y.o. male     MRN : [id number]     DOB: September 02, 1931  Procedure Date: 06/17/2013  Nuclear Med Background Indication for Stress Test:  Evaluation for Ischemia, Surgical Clearance:Pre op for Left CEA with Dr. Edilia Bo and Graft Patency History:  90's Ablation AFIB, '98 Pacemaker: CHB, '00 GXT, Heart Cath, [facility]; [facility], CABG x4, Cardiac Risk Factors: Carotid Disease, History of Smoking, Hypertension and Lipids  Symptoms:  Chest Pain   Nuclear Pre-Procedure Caffeine/Decaff Intake:  None NPO After: 7:00pm   Lungs:  clear O2 Sat: 95% on room air. IV 0.9% NS with Angio Cath:  22g  IV Site: R Hand  IV Started by:  Cathlyn Parsons, RN  Chest Size (in):  44 Cup Size: n/a  Height: 6' (1.829 m)  Weight:  210 lb (95.255 kg)  BMI:  Body mass index is 28.47 kg/(m^2). Tech Comments:  n/a    Nuclear Med Study 1 or 2 day study: 1 day  Stress Test Type:  Adenosine  Reading MD: Dietrich Pates, MD  Order Authorizing Provider:  Ferman Hamming  Resting Radionuclide: Technetium 43m Sestamibi  Resting Radionuclide Dose: 11.0 mCi   Stress Radionuclide:  Technetium 39m Sestamibi  Stress Radionuclide Dose: 33.0 mCi           Stress Protocol Rest HR: 70 Stress HR: 76  Rest BP: 143/79 Stress BP: 165/69  Exercise Time (min): n/a METS: n/a   Predicted Max HR: 141 bpm % Max HR: 53.9 bpm Rate Pressure Product: 21308   Dose of Adenosine (mg):  53.5 Dose of Lexiscan: n/a mg  Dose of Atropine (mg): n/a Dose of Dobutamine: n/a mcg/kg/min (at max HR)  Stress Test Technologist: Milana Na, EMT-P  Nuclear Technologist:  Doyne Keel, CNMT     Rest Procedure:  Myocardial perfusion imaging was performed at rest 45 minutes following the intravenous administration of Technetium 50m Sestamibi. Rest ECG: Ventricular paced  Stress Procedure:  The patient  received IV adenosine at 140 mcg/kg/min for 4 minutes.  Technetium 35m Sestamibi was injected at the 2 minute mark and quantitative spect images were obtained after a 45 minute delay. Stress ECG:  EKG is nondiagnostic due to baseline changes  QPS Raw Data Images: Soft tissue (diaphragm) underlies the heart Stress Images:  Thinning with decreased counts in the inferior wall (base, mid, distal) and apex.   Rest Images:   No significant change from the stress images. Subtraction (SDS):  No ischemia Transient Ischemic Dilatation (Normal <1.22):  n/a Lung/Heart Ratio (Normal <0.45):  0.39  Quantitative Gated Spect Images QGS EDV:  84 ml QGS ESV:  34 ml  Impression Exercise Capacity:  Lexiscan with no exercise. BP Response:  Normal blood pressure response. Clinical Symptoms:  No chest pain. ECG Impression:  Nondiagnostic Comparison with Prior Nuclear Study: No report available.  Overall Impression:  Prbable normal perfusion and soft tissue attenuation (diaphragm), cannot exclude subendocardial scar.  No ischemia.  LVEF 59%    LV Ejection Fraction: 59%.  LV Wall Motion:  NL LV Function; NL Wall Motion  Dietrich Pates

## 2013-06-19 ENCOUNTER — Other Ambulatory Visit: Payer: Self-pay | Admitting: *Deleted

## 2013-06-19 ENCOUNTER — Encounter: Payer: Self-pay | Admitting: *Deleted

## 2013-06-20 ENCOUNTER — Encounter (HOSPITAL_COMMUNITY): Payer: Self-pay | Admitting: Pharmacy Technician

## 2013-06-20 ENCOUNTER — Telehealth: Payer: Self-pay | Admitting: *Deleted

## 2013-06-20 NOTE — Telephone Encounter (Signed)
Mrs Gramm called stating that Mr Gelb is scheduled for surgery on 06/27/2013 and has been instructed to hold coumadin 5 days prior to surgery. This nurse called Joyce Gross at Dr Edilia Bo office and she stated that he did need to be off coumadin for 5days . Has had Lexiscan and was seen by Norma Fredrickson NP 06/12/2013. Sent note to [employer] informing of above and asking for advise and direction and if he needs to be bridged with Lovenox. Mrs Kroening informed of above and that  we will call her tomorrow with further instructions and she states understanding

## 2013-06-21 ENCOUNTER — Encounter: Payer: Self-pay | Admitting: Gastroenterology

## 2013-06-21 ENCOUNTER — Encounter: Payer: Self-pay | Admitting: Pulmonary Disease

## 2013-06-21 ENCOUNTER — Encounter: Payer: Self-pay | Admitting: Internal Medicine

## 2013-06-21 ENCOUNTER — Telehealth: Payer: Self-pay | Admitting: *Deleted

## 2013-06-21 NOTE — Telephone Encounter (Signed)
Pts wife instructed that last day for Mr Hur  to take coumadin is today 06/21/2013  No coumadin 06/22/2013  06/23/2013  06/24/2013 06/25/2013  06/26/2013 06/27/2013 day of procedure no coumadin until after procedure and when instructed by MD doing procedure to restart coumadin restart at same dose and take extra 1/2 tablet for 2 days and see in clinic on 07/04/2013 and Mrs Nikolai states she understands instructions given and  That she has written  these instructions down.

## 2013-06-21 NOTE — Telephone Encounter (Signed)
Message copied by Jeannine Kitten on Fri Jun 21, 2013  8:57 AM ------      Message from: Rosalio Macadamia      Created: Fri Jun 21, 2013  7:47 AM       Jasmine December,            I don't think he needs to bridge with Lovenox.            lori      ----- Message -----         From: Jeannine Kitten, RN         Sent: 06/20/2013  11:39 AM           To: Rosalio Macadamia, NP            Mrs Jeppsen called said Mr Hayduk is scheduled for surgery on 06/27/2013 and is to be off coumadin 5 days prior. I called and spoke with Joyce Gross at Dr Edilia Bo office and she states that is correct that he is to be off coumadin 5 days prior. Please advise and does he need to be bridged with Lovenox      Thanks      Lelon Perla RN       ------

## 2013-06-24 NOTE — Pre-Procedure Instructions (Signed)
Ricky [name]  06/24/2013   Your procedure is scheduled on: Thursday, June 27, 2013  Report to [facility] Stay [facility] at 7:30 AM.  Call this number if you have problems the morning of surgery: [phone]   Remember:   Do not eat food or drink liquids after midnight.   Take these medicines the morning of surgery with A SIP OF WATER: donepezil (ARICEPT) 10 MG tablet, finasteride (PROSCAR) 5 MG tablet, NAMENDA XR 28 MG CP24, QUEtiapine (SEROQUEL) 50 MG tablet, Fesoterodine (Tovuaz) and Xalantan eye dropsif needed:  acetaminophen (TYLENOL) 500 MG tablet for pain OR traMADol (ULTRAM) 50 MG tablet for pain  ALPRAZolam (XANAX) 0.25 MG tablet for anxiety  Stop taking Aspirin, Coumadin, and herbal medications. Do not take any NSAIDs ie: Ibuprofen, Advil, Naproxen or any medication containing Aspirin.    Do not wear jewelry.  Do not wear lotions, powders, or colognes.  Men may shave face and neck.  Do not bring valuables to the hospital.  [facility] is not responsible  for any belongings or valuables.  Contacts, dentures or bridgework may not be worn into surgery.  Leave suitcase in the car. After surgery it may be brought to your room.  For patients admitted to the hospital, checkout time is 11:00 AM the day of discharge.   Patients discharged the day of surgery will not be allowed to drive home.  Name and phone number of your driver:   Special Instructions: Shower using CHG 2 nights before surgery and the night before surgery.  If you shower the day of surgery use CHG.  Use special wash - you have one bottle of CHG for all showers.  You should use approximately 1/3 of the bottle for each shower.   Please read over the following fact sheets that you were given: Pain Booklet, Coughing and Deep Breathing, Blood Transfusion Information, MRSA Information and Surgical Site Infection Prevention

## 2013-06-25 ENCOUNTER — Encounter (HOSPITAL_COMMUNITY)
Admission: RE | Admit: 2013-06-25 | Discharge: 2013-06-25 | Disposition: A | Discharge status: Home / Self Care | Payer: Medicare Other | Source: Ambulatory Visit | Attending: Vascular Surgery | Admitting: Vascular Surgery

## 2013-06-25 ENCOUNTER — Ambulatory Visit (HOSPITAL_COMMUNITY)
Admission: RE | Admit: 2013-06-25 | Discharge: 2013-06-25 | Disposition: A | Discharge status: Home / Self Care | Payer: Medicare Other | Source: Ambulatory Visit | Attending: Vascular Surgery | Admitting: Vascular Surgery

## 2013-06-25 ENCOUNTER — Encounter (HOSPITAL_COMMUNITY): Payer: Self-pay

## 2013-06-25 DIAGNOSIS — I2510 Atherosclerotic heart disease of native coronary artery without angina pectoris: Secondary | ICD-10-CM | POA: Insufficient documentation

## 2013-06-25 DIAGNOSIS — Z01818 Encounter for other preprocedural examination: Secondary | ICD-10-CM | POA: Insufficient documentation

## 2013-06-25 DIAGNOSIS — Z0183 Encounter for blood typing: Secondary | ICD-10-CM | POA: Insufficient documentation

## 2013-06-25 DIAGNOSIS — Z951 Presence of aortocoronary bypass graft: Secondary | ICD-10-CM | POA: Insufficient documentation

## 2013-06-25 DIAGNOSIS — I10 Essential (primary) hypertension: Secondary | ICD-10-CM | POA: Insufficient documentation

## 2013-06-25 DIAGNOSIS — Z01812 Encounter for preprocedural laboratory examination: Secondary | ICD-10-CM | POA: Insufficient documentation

## 2013-06-25 DIAGNOSIS — I6529 Occlusion and stenosis of unspecified carotid artery: Secondary | ICD-10-CM | POA: Insufficient documentation

## 2013-06-25 DIAGNOSIS — Z950 Presence of cardiac pacemaker: Secondary | ICD-10-CM | POA: Insufficient documentation

## 2013-06-25 HISTORY — DX: Pneumonia, unspecified organism: J18.9

## 2013-06-25 HISTORY — DX: Essential (primary) hypertension: I10

## 2013-06-25 LAB — COMPREHENSIVE METABOLIC PANEL
ALT: 14 U/L (ref 0–53)
CO2: 26 mEq/L (ref 19–32)
Calcium: 10.1 mg/dL (ref 8.4–10.5)
Chloride: 103 mEq/L (ref 96–112)
GFR calc Af Amer: 74 mL/min — ABNORMAL LOW (ref >90)
GFR calc non Af Amer: 64 mL/min — ABNORMAL LOW (ref >90)
Glucose, Bld: 144 mg/dL — ABNORMAL HIGH (ref 70–99)
Sodium: 140 mEq/L (ref 135–145)
Total Bilirubin: 0.5 mg/dL (ref 0.3–1.2)

## 2013-06-25 LAB — CBC
Hemoglobin: 15.3 g/dL (ref 13.0–17.0)
MCH: 32.9 pg (ref 26.0–34.0)
MCV: 95.3 fL (ref 78.0–100.0)
RBC: 4.65 MIL/uL (ref 4.22–5.81)

## 2013-06-25 LAB — URINALYSIS, ROUTINE W REFLEX MICROSCOPIC
Glucose, UA: NEGATIVE mg/dL
Hgb urine dipstick: NEGATIVE
Protein, ur: 30 mg/dL — AB
Specific Gravity, Urine: 1.02 (ref 1.005–1.030)
pH: 6 (ref 5.0–8.0)

## 2013-06-25 LAB — TYPE AND SCREEN: ABO/RH(D): O POS

## 2013-06-25 LAB — URINE MICROSCOPIC-ADD ON

## 2013-06-25 LAB — SURGICAL PCR SCREEN: MRSA, PCR: NEGATIVE

## 2013-06-25 LAB — ABO/RH: ABO/RH(D): O POS

## 2013-06-25 NOTE — Progress Notes (Signed)
This patient has screened at risk for obstructive sleep apnea using the STOP Bang tool used during a pre-surgical visit. A score of 4 or greater is at risk for sleep apnea.

## 2013-06-25 NOTE — Progress Notes (Signed)
PCP/Pulmonary is Dr. [name] and Cardiolgist is Dr. Sherryl Manges. Stress test in [facility] 06/17/13. Patient denied having any cardiac or pulmonary issues at this time. Patient informed Nurse that coumadin was stopped on 06/21/13.

## 2013-06-26 MED ORDER — DEXTROSE 5 % IV SOLN
1.5000 g | INTRAVENOUS | Status: AC
  Administered 2013-06-27: 1.5 g via INTRAVENOUS
  Filled 2013-06-26: qty 1.5

## 2013-06-26 NOTE — Progress Notes (Signed)
Anesthesia Chart Review:  Patient is a 79 year old male scheduled for left CEA by Dr. Edilia Bo on 06/27/13.  History includes former smoker, CAD s/p CABG '00, ischemic CM,afib s/p AV junction ablation, CHB s/p [employer] PPM, HTN, OSA, carotid occlusive disease s/p right CEA '04, hypercholesterolemia, anemia, hiatal hernia s/p repair, anxiety, BPH, dementia. PCP is Dr. [name].  He is formerly a patient of cardiologist Dr. [name], now retired.  EP cardiologist is Dr. Graciela Husbands. He was seen by Norma Fredrickson, NP at [facility] on 06/12/13 to CAD follow-up.  She is aware of upcoming surgery.  Due to his multiple medical issues he was felt to be at increased risk for any procedure, but a Myoview was ordered due to recent reports of chest pain.  Stress test results from 06/17/13 showed: Prbable normal perfusion and soft tissue attenuation (diaphragm), cannot exclude subendocardial scar. No ischemia. LVEF 59% LV Ejection Fraction: 59%. LV Wall Motion: NL LV Function; NL Wall Motion.  Echo on 07/28/10 showed: Normal LV size with mild LV hypertrophy. EF 55% with septal-lateral dyssynergy consistent with RV pacing, otherwise wall motion appeared normal. Moderate biatrial enlargement. Mild pulmonary hypertension. Mildly dilated RV with mild systolic Dysfunction. Mild MR, moderate TR.  EKG on 05/31/13 showed a V-paced rhythm. EP notes indicate that patient is pacer dependent.  CAROTID DUPLEX 05/31/13 showed a very tight left carotid stenosis. This is greater than 80%. The peak systolic velocity of 676 cm/s with end-diastolic velocity 175 cm/s. He has no evidence of recurrent carotid stenosis on the right. Both vertebral arteries are patent with antegrade flow.  Preoperative CXR and labs noted.  Patient's Coumadin is on hold.  Tyrone Sage, NP did not think he would require a Lovenox bridge.  He will be evaluated by his assigned anesthesiologist on the day of surgery and if no acute changes then anticipate that he can  proceed.  Shonna Chock, PA-C [facility] Stay [facility]/Anesthesiology Phone [phone] 06/26/2013 10:00 AM

## 2013-06-27 ENCOUNTER — Encounter (HOSPITAL_COMMUNITY): Payer: Self-pay | Admitting: Anesthesiology

## 2013-06-27 ENCOUNTER — Telehealth: Payer: Self-pay | Admitting: Vascular Surgery

## 2013-06-27 ENCOUNTER — Encounter (HOSPITAL_COMMUNITY)
Admission: RE | Disposition: A | Discharge status: Home / Self Care | Payer: Self-pay | Source: Ambulatory Visit | Attending: Vascular Surgery

## 2013-06-27 ENCOUNTER — Encounter (HOSPITAL_COMMUNITY): Payer: Self-pay | Admitting: Vascular Surgery

## 2013-06-27 ENCOUNTER — Inpatient Hospital Stay (HOSPITAL_COMMUNITY)
Admission: RE | Admit: 2013-06-27 | Discharge: 2013-06-28 | DRG: 039 | Disposition: A | Discharge status: Home / Self Care | Payer: Medicare Other | Source: Ambulatory Visit | Attending: Vascular Surgery | Admitting: Vascular Surgery

## 2013-06-27 ENCOUNTER — Inpatient Hospital Stay (HOSPITAL_COMMUNITY): Payer: Medicare Other | Admitting: Anesthesiology

## 2013-06-27 DIAGNOSIS — Z87891 Personal history of nicotine dependence: Secondary | ICD-10-CM

## 2013-06-27 DIAGNOSIS — F0390 Unspecified dementia without behavioral disturbance: Secondary | ICD-10-CM | POA: Diagnosis present

## 2013-06-27 DIAGNOSIS — G4733 Obstructive sleep apnea (adult) (pediatric): Secondary | ICD-10-CM | POA: Diagnosis present

## 2013-06-27 DIAGNOSIS — I2589 Other forms of chronic ischemic heart disease: Secondary | ICD-10-CM | POA: Diagnosis present

## 2013-06-27 DIAGNOSIS — I10 Essential (primary) hypertension: Secondary | ICD-10-CM | POA: Diagnosis present

## 2013-06-27 DIAGNOSIS — I4891 Unspecified atrial fibrillation: Secondary | ICD-10-CM | POA: Diagnosis present

## 2013-06-27 DIAGNOSIS — N400 Enlarged prostate without lower urinary tract symptoms: Secondary | ICD-10-CM | POA: Diagnosis present

## 2013-06-27 DIAGNOSIS — I6529 Occlusion and stenosis of unspecified carotid artery: Secondary | ICD-10-CM

## 2013-06-27 DIAGNOSIS — E7800 Pure hypercholesterolemia, unspecified: Secondary | ICD-10-CM | POA: Diagnosis present

## 2013-06-27 HISTORY — PX: ENDARTERECTOMY: SHX5162

## 2013-06-27 SURGERY — ENDARTERECTOMY, CAROTID
Anesthesia: General | Site: Neck | Laterality: Left | Wound class: Clean

## 2013-06-27 MED ORDER — FINASTERIDE 5 MG PO TABS
5.0000 mg | ORAL_TABLET | Freq: Every day | ORAL | Status: DC
  Administered 2013-06-28: 5 mg via ORAL
  Filled 2013-06-27: qty 1

## 2013-06-27 MED ORDER — WARFARIN SODIUM 2.5 MG PO TABS: 2.5000 mg | ORAL_TABLET | ORAL | Status: DC

## 2013-06-27 MED ORDER — LISINOPRIL 20 MG PO TABS
20.0000 mg | ORAL_TABLET | Freq: Every day | ORAL | Status: DC
  Administered 2013-06-28: 20 mg via ORAL
  Filled 2013-06-27: qty 1

## 2013-06-27 MED ORDER — SODIUM CHLORIDE 0.9 % IV SOLN: 500.0000 mL | Freq: Once | INTRAVENOUS | Status: AC | PRN

## 2013-06-27 MED ORDER — ACETAMINOPHEN 650 MG RE SUPP: 325.0000 mg | RECTAL | Status: DC | PRN

## 2013-06-27 MED ORDER — ONDANSETRON HCL 4 MG/2ML IJ SOLN: 4.0000 mg | Freq: Once | INTRAMUSCULAR | Status: DC | PRN

## 2013-06-27 MED ORDER — WARFARIN - PHYSICIAN DOSING INPATIENT
Freq: Every day | Status: DC
  Administered 2013-06-27: 19:00:00

## 2013-06-27 MED ORDER — TRAMADOL HCL 50 MG PO TABS
50.0000 mg | ORAL_TABLET | Freq: Three times a day (TID) | ORAL | Status: DC | PRN
  Administered 2013-06-27: 50 mg via ORAL
  Filled 2013-06-27: qty 1

## 2013-06-27 MED ORDER — DOCUSATE SODIUM 100 MG PO CAPS
100.0000 mg | ORAL_CAPSULE | Freq: Every day | ORAL | Status: DC
  Administered 2013-06-28: 100 mg via ORAL

## 2013-06-27 MED ORDER — HYDROMORPHONE HCL PF 1 MG/ML IJ SOLN: 0.5000 mg | INTRAMUSCULAR | Status: DC | PRN

## 2013-06-27 MED ORDER — DONEPEZIL HCL 10 MG PO TABS
10.0000 mg | ORAL_TABLET | Freq: Every day | ORAL | Status: DC
  Administered 2013-06-28: 10 mg via ORAL
  Filled 2013-06-27: qty 1

## 2013-06-27 MED ORDER — WARFARIN SODIUM 5 MG PO TABS
5.0000 mg | ORAL_TABLET | ORAL | Status: DC
  Administered 2013-06-27: 5 mg via ORAL
  Filled 2013-06-27: qty 1

## 2013-06-27 MED ORDER — ALPRAZOLAM 0.25 MG PO TABS: 0.2500 mg | ORAL_TABLET | Freq: Three times a day (TID) | ORAL | Status: DC | PRN

## 2013-06-27 MED ORDER — SODIUM CHLORIDE 0.9 % IV SOLN: INTRAVENOUS | Status: DC

## 2013-06-27 MED ORDER — DEXTROSE 5 % IV SOLN
1.5000 g | Freq: Two times a day (BID) | INTRAVENOUS | Status: AC
  Administered 2013-06-27 – 2013-06-28 (×2): 1.5 g via INTRAVENOUS
  Filled 2013-06-27 (×2): qty 1.5

## 2013-06-27 MED ORDER — SODIUM CHLORIDE 0.9 % IV SOLN
INTRAVENOUS | Status: DC
  Administered 2013-06-27 – 2013-06-28 (×2): via INTRAVENOUS

## 2013-06-27 MED ORDER — LACTATED RINGERS IV SOLN
INTRAVENOUS | Status: DC
  Administered 2013-06-27 (×3): via INTRAVENOUS

## 2013-06-27 MED ORDER — HEPARIN SODIUM (PORCINE) 1000 UNIT/ML IJ SOLN
INTRAMUSCULAR | Status: DC | PRN
  Administered 2013-06-27: 9000 [IU] via INTRAVENOUS

## 2013-06-27 MED ORDER — PHENOL 1.4 % MT LIQD
1.0000 | OROMUCOSAL | Status: DC | PRN
  Administered 2013-06-27: 1 via OROMUCOSAL
  Filled 2013-06-27: qty 177

## 2013-06-27 MED ORDER — ALUM & MAG HYDROXIDE-SIMETH 200-200-20 MG/5ML PO SUSP: 15.0000 mL | ORAL | Status: DC | PRN

## 2013-06-27 MED ORDER — HYDRALAZINE HCL 20 MG/ML IJ SOLN: 10.0000 mg | INTRAMUSCULAR | Status: DC | PRN

## 2013-06-27 MED ORDER — LABETALOL HCL 5 MG/ML IV SOLN: 10.0000 mg | INTRAVENOUS | Status: DC | PRN

## 2013-06-27 MED ORDER — ROCURONIUM BROMIDE 100 MG/10ML IV SOLN
INTRAVENOUS | Status: DC | PRN
  Administered 2013-06-27: 50 mg via INTRAVENOUS

## 2013-06-27 MED ORDER — GLYCOPYRROLATE 0.2 MG/ML IJ SOLN
INTRAMUSCULAR | Status: DC | PRN
  Administered 2013-06-27: 0.6 mg via INTRAVENOUS

## 2013-06-27 MED ORDER — LIDOCAINE-EPINEPHRINE (PF) 1 %-1:200000 IJ SOLN
INTRAMUSCULAR | Status: DC | PRN
  Administered 2013-06-27: 10 mL

## 2013-06-27 MED ORDER — LIDOCAINE-EPINEPHRINE 1 %-1:100000 IJ SOLN
INTRAMUSCULAR | Status: AC
  Filled 2013-06-27: qty 1

## 2013-06-27 MED ORDER — DEXTRAN 40 IN SALINE 10-0.9 % IV SOLN
INTRAVENOUS | Status: AC
  Filled 2013-06-27: qty 500

## 2013-06-27 MED ORDER — PROPOFOL 10 MG/ML IV BOLUS
INTRAVENOUS | Status: DC | PRN
  Administered 2013-06-27: 160 mg via INTRAVENOUS

## 2013-06-27 MED ORDER — HYDROMORPHONE HCL PF 1 MG/ML IJ SOLN
0.2500 mg | INTRAMUSCULAR | Status: DC | PRN
  Administered 2013-06-27 (×2): 0.25 mg via INTRAVENOUS
  Administered 2013-06-27: 0.5 mg via INTRAVENOUS

## 2013-06-27 MED ORDER — GUAIFENESIN-DM 100-10 MG/5ML PO SYRP: 15.0000 mL | ORAL_SOLUTION | ORAL | Status: DC | PRN

## 2013-06-27 MED ORDER — ACETAMINOPHEN 325 MG PO TABS
325.0000 mg | ORAL_TABLET | ORAL | Status: DC | PRN
Start: 2013-06-27 — End: 2013-06-28

## 2013-06-27 MED ORDER — PHENYLEPHRINE HCL 10 MG/ML IJ SOLN
10.0000 mg | INTRAVENOUS | Status: DC | PRN
  Administered 2013-06-27: 25 ug/min via INTRAVENOUS

## 2013-06-27 MED ORDER — POTASSIUM CHLORIDE CRYS ER 20 MEQ PO TBCR: 20.0000 meq | EXTENDED_RELEASE_TABLET | Freq: Once | ORAL | Status: AC | PRN

## 2013-06-27 MED ORDER — WARFARIN SODIUM 2.5 MG PO TABS: 2.5000 mg | ORAL_TABLET | Freq: Every day | ORAL | Status: DC

## 2013-06-27 MED ORDER — NEOSTIGMINE METHYLSULFATE 1 MG/ML IJ SOLN
INTRAMUSCULAR | Status: DC | PRN
  Administered 2013-06-27: 5 mg via INTRAVENOUS

## 2013-06-27 MED ORDER — 0.9 % SODIUM CHLORIDE (POUR BTL) OPTIME
TOPICAL | Status: DC | PRN
  Administered 2013-06-27: 2000 mL

## 2013-06-27 MED ORDER — ONDANSETRON HCL 4 MG/2ML IJ SOLN: 4.0000 mg | Freq: Four times a day (QID) | INTRAMUSCULAR | Status: DC | PRN

## 2013-06-27 MED ORDER — LIDOCAINE HCL (CARDIAC) 20 MG/ML IV SOLN
INTRAVENOUS | Status: DC | PRN
  Administered 2013-06-27: 80 mg via INTRAVENOUS

## 2013-06-27 MED ORDER — FESOTERODINE FUMARATE ER 4 MG PO TB24
4.0000 mg | ORAL_TABLET | Freq: Every day | ORAL | Status: DC
  Administered 2013-06-28: 4 mg via ORAL
  Filled 2013-06-27: qty 1

## 2013-06-27 MED ORDER — HYDROMORPHONE HCL PF 1 MG/ML IJ SOLN
INTRAMUSCULAR | Status: AC
  Filled 2013-06-27: qty 1

## 2013-06-27 MED ORDER — LATANOPROST 0.005 % OP SOLN
1.0000 [drp] | Freq: Every day | OPHTHALMIC | Status: DC
  Administered 2013-06-27 – 2013-06-28 (×2): 1 [drp] via OPHTHALMIC
  Filled 2013-06-27: qty 2.5

## 2013-06-27 MED ORDER — WARFARIN SODIUM 2.5 MG PO TABS
2.5000 mg | ORAL_TABLET | ORAL | Status: DC
  Filled 2013-06-27: qty 1

## 2013-06-27 MED ORDER — QUETIAPINE FUMARATE 100 MG PO TABS
100.0000 mg | ORAL_TABLET | Freq: Every day | ORAL | Status: DC
  Administered 2013-06-28: 100 mg via ORAL
  Filled 2013-06-27: qty 1

## 2013-06-27 MED ORDER — ONDANSETRON HCL 4 MG/2ML IJ SOLN
INTRAMUSCULAR | Status: DC | PRN
  Administered 2013-06-27: 4 mg via INTRAVENOUS

## 2013-06-27 MED ORDER — PROTAMINE SULFATE 10 MG/ML IV SOLN
INTRAVENOUS | Status: DC | PRN
  Administered 2013-06-27: 40 mg via INTRAVENOUS

## 2013-06-27 MED ORDER — METOPROLOL TARTRATE 1 MG/ML IV SOLN: 2.0000 mg | INTRAVENOUS | Status: DC | PRN

## 2013-06-27 MED ORDER — ASPIRIN EC 81 MG PO TBEC
81.0000 mg | DELAYED_RELEASE_TABLET | Freq: Every day | ORAL | Status: DC
  Administered 2013-06-27 – 2013-06-28 (×2): 81 mg via ORAL
  Filled 2013-06-27 (×2): qty 1

## 2013-06-27 MED ORDER — SUFENTANIL CITRATE 50 MCG/ML IV SOLN
INTRAVENOUS | Status: DC | PRN
  Administered 2013-06-27: 10 ug via INTRAVENOUS
  Administered 2013-06-27: 20 ug via INTRAVENOUS

## 2013-06-27 MED ORDER — DEXTRAN 40 IN SALINE 10-0.9 % IV SOLN
INTRAVENOUS | Status: DC | PRN
  Administered 2013-06-27: 500 mL

## 2013-06-27 MED ORDER — SODIUM CHLORIDE 0.9 % IR SOLN
Status: DC | PRN
  Administered 2013-06-27: 12:00:00

## 2013-06-27 MED ORDER — SIMVASTATIN 40 MG PO TABS
40.0000 mg | ORAL_TABLET | Freq: Every day | ORAL | Status: DC
  Administered 2013-06-27: 40 mg via ORAL
  Filled 2013-06-27 (×2): qty 1

## 2013-06-27 SURGICAL SUPPLY — 52 items
ADH SKN CLS APL DERMABOND .7 (GAUZE/BANDAGES/DRESSINGS) ×1
BAG DECANTER FOR FLEXI CONT (MISCELLANEOUS) ×2 IMPLANT
CANISTER SUCTION 2500CC (MISCELLANEOUS) ×2 IMPLANT
CANNULA VESSEL W/WING WO/VALVE (CANNULA) ×2 IMPLANT
CATH ROBINSON RED A/P 18FR (CATHETERS) ×2 IMPLANT
CLIP TI MEDIUM 24 (CLIP) ×2 IMPLANT
CLIP TI WIDE RED SMALL 24 (CLIP) ×2 IMPLANT
CLOTH BEACON ORANGE TIMEOUT ST (SAFETY) ×2 IMPLANT
COVER SURGICAL LIGHT HANDLE (MISCELLANEOUS) ×2 IMPLANT
CRADLE DONUT ADULT HEAD (MISCELLANEOUS) ×2 IMPLANT
DERMABOND ADVANCED (GAUZE/BANDAGES/DRESSINGS) ×1
DERMABOND ADVANCED .7 DNX12 (GAUZE/BANDAGES/DRESSINGS) ×1 IMPLANT
DRAIN CHANNEL 15F RND FF W/TCR (WOUND CARE) IMPLANT
DRAPE WARM FLUID 44X44 (DRAPE) ×2 IMPLANT
ELECT REM PT RETURN 9FT ADLT (ELECTROSURGICAL) ×2
ELECTRODE REM PT RTRN 9FT ADLT (ELECTROSURGICAL) ×1 IMPLANT
EVACUATOR SILICONE 100CC (DRAIN) IMPLANT
GLOVE BIO SURGEON STRL SZ7.5 (GLOVE) ×2 IMPLANT
GLOVE BIOGEL PI IND STRL 6.5 (GLOVE) IMPLANT
GLOVE BIOGEL PI IND STRL 7.0 (GLOVE) IMPLANT
GLOVE BIOGEL PI IND STRL 8 (GLOVE) ×1 IMPLANT
GLOVE BIOGEL PI INDICATOR 6.5 (GLOVE) ×2
GLOVE BIOGEL PI INDICATOR 7.0 (GLOVE) ×1
GLOVE BIOGEL PI INDICATOR 8 (GLOVE) ×1
GLOVE ECLIPSE 6.5 STRL STRAW (GLOVE) ×2 IMPLANT
GLOVE SURG SS PI 6.5 STRL IVOR (GLOVE) ×2 IMPLANT
GOWN STRL NON-REIN LRG LVL3 (GOWN DISPOSABLE) ×7 IMPLANT
KIT BASIN OR (CUSTOM PROCEDURE TRAY) ×2 IMPLANT
KIT ROOM TURNOVER OR (KITS) ×2 IMPLANT
NDL HYPO 25X1 1.5 SAFETY (NEEDLE) ×1 IMPLANT
NEEDLE HYPO 25X1 1.5 SAFETY (NEEDLE) ×2 IMPLANT
NS IRRIG 1000ML POUR BTL (IV SOLUTION) ×4 IMPLANT
PACK CAROTID (CUSTOM PROCEDURE TRAY) ×2 IMPLANT
PAD ARMBOARD 7.5X6 YLW CONV (MISCELLANEOUS) ×4 IMPLANT
PATCH HEMASHIELD 8X150 (Vascular Products) ×1 IMPLANT
SHUNT CAROTID BYPASS 10 (VASCULAR PRODUCTS) IMPLANT
SHUNT CAROTID BYPASS 12 (VASCULAR PRODUCTS) ×1 IMPLANT
SHUNT CAROTID BYPASS 12FRX15.5 (VASCULAR PRODUCTS) IMPLANT
SPONGE SURGIFOAM ABS GEL 100 (HEMOSTASIS) IMPLANT
SUT PROLENE 6 0 BV (SUTURE) ×2 IMPLANT
SUT PROLENE 7 0 BV 1 (SUTURE) IMPLANT
SUT SILK 2 0 FS (SUTURE) IMPLANT
SUT SILK 3 0 TIES 17X18 (SUTURE)
SUT SILK 3-0 18XBRD TIE BLK (SUTURE) IMPLANT
SUT VIC AB 3-0 SH 27 (SUTURE) ×2
SUT VIC AB 3-0 SH 27X BRD (SUTURE) ×1 IMPLANT
SUT VICRYL 4-0 PS2 18IN ABS (SUTURE) ×2 IMPLANT
SYR CONTROL 10ML LL (SYRINGE) ×2 IMPLANT
TOWEL OR 17X24 6PK STRL BLUE (TOWEL DISPOSABLE) ×2 IMPLANT
TOWEL OR 17X26 10 PK STRL BLUE (TOWEL DISPOSABLE) ×2 IMPLANT
TRAY FOLEY CATH 16FRSI W/METER (SET/KITS/TRAYS/PACK) ×2 IMPLANT
WATER STERILE IRR 1000ML POUR (IV SOLUTION) ×2 IMPLANT

## 2013-06-27 NOTE — Progress Notes (Signed)
Informed  Arlys Toby about pacemaker.

## 2013-06-27 NOTE — Anesthesia Postprocedure Evaluation (Signed)
Anesthesia Post Note  Patient: Ricky [name]  Procedure(s) Performed: Procedure(s) (LRB): Left Carotid Endarterectomy with Finesse patch angioplasty (Left)  Anesthesia type: general  Patient location: PACU  Post pain: Pain level controlled  Post assessment: Patient's Cardiovascular Status Stable  Last Vitals:  Filed Vitals:   06/27/13 1500  BP: 101/55  Pulse: 70  Temp:   Resp: 11    Post vital signs: Reviewed and stable  Level of consciousness: sedated  Complications: No apparent anesthesia complications

## 2013-06-27 NOTE — Progress Notes (Signed)
VASCULAR PROGRESS NOTE  SUBJECTIVE: No complaints.  PHYSICAL EXAM: Filed Vitals:   06/27/13 1445 06/27/13 1500 06/27/13 1515 06/27/13 1530  BP: 102/57 101/55 105/58   Pulse: 70 70 69 69  Temp:   97.1 F (36.2 C)   TempSrc:      Resp: 14 11 19 19   SpO2: 97% 98% 93% 95%   Left neck incision looks fine.  Neuro intact.  ASSESSMENT AND PLAN:  Stable post op. Start Coumadin tomorrow.  Anticipate D/C in AM.   Cari Caraway Beeper: [phone] 06/27/2013

## 2013-06-27 NOTE — Anesthesia Preprocedure Evaluation (Addendum)
Anesthesia Evaluation  Patient identified by MRN, date of birth, ID band Patient awake    Reviewed: Allergy & Precautions, H&P , NPO status , Patient's Chart, lab work & pertinent test results  Airway Mallampati: I TM Distance: >3 FB Neck ROM: full    Dental   Pulmonary sleep apnea , pneumonia -, resolved,          Cardiovascular hypertension, + CAD and + Peripheral Vascular Disease + dysrhythmias + pacemaker Rhythm:regular Rate:Normal     Neuro/Psych    GI/Hepatic Neg liver ROS, hiatal hernia,   Endo/Other    Renal/GU negative Renal ROS     Musculoskeletal   Abdominal   Peds  Hematology  (+) anemia ,   Anesthesia Other Findings   Reproductive/Obstetrics                          Anesthesia Physical Anesthesia Plan  ASA: III  Anesthesia Plan: General   Post-op Pain Management:    Induction: Intravenous  Airway Management Planned: Oral ETT  Additional Equipment: Arterial line  Intra-op Plan:   Post-operative Plan: Extubation in OR  Informed Consent: I have reviewed the patients History and Physical, chart, labs and discussed the procedure including the risks, benefits and alternatives for the proposed anesthesia with the patient or authorized representative who has indicated his/her understanding and acceptance.   Dental advisory given  Plan Discussed with: CRNA, Anesthesiologist and Surgeon  Anesthesia Plan Comments:        Anesthesia Quick Evaluation

## 2013-06-27 NOTE — Telephone Encounter (Signed)
Message copied by Jena Gauss on Thu Jun 27, 2013  4:45 PM ------      Message from: Marlowe Shores      Created: Thu Jun 27, 2013  1:40 PM       2 week F/U CEA Edilia Bo ------

## 2013-06-27 NOTE — H&P (View-Only) (Signed)
[facility]  Patient name: Ricky [name] MRN: [id number] DOB: [birth year] Sex: male  REASON FOR VISIT: rate of 80% left carotid stenosis.  HPI: Ricky [name] is a 79 y.o. male was here for a routine carotid duplex scan last week. He has undergone previous right carotid endarterectomy in 2004. The right carotid endarterectomy site was widely patent. The carotid stenosis on the left however it progressed to greater than 80%. Previous study 6 months ago which showed a 60-79% left carotid stenosis. The carotid stenosis was asymptomatic. The patient had been having chest pain so he was to go to the cardiologist office for evaluation. He comes back today to discuss these results.  States that his chest pain resolved and he is had no further episodes of chest pain. He is scheduled to see [facility] cardiology on 06/12/2013. Of note he is on Coumadin for a history of atrial fibrillation. He denies any history of stroke, TIAs, expressive or receptive aphasia, or amaurosis fugax. Of note he does have some history of dementia. He is not on aspirin. He is on a statin.   Past Medical History  Diagnosis Date  . Obstructive sleep apnea   . Ischemic cardiomyopathy   . Atrial fibrillation   . AV block, complete s/p ablation   . Pacemaker Sylvia   . Cerebrovascular disease   . Hypercholesterolemia   . Hiatal hernia   . Gastritis   . Diverticulosis   . Colonic polyp   . BPH (benign prostatic hypertrophy)   . DJD (degenerative joint disease)   . Memory loss   . Anxiety   . Anemia   . Shingles   . Carotid artery occlusion    Family History  Problem Relation Age of Onset  . Coronary artery disease Other    For VQI Use Only  PRE-ADM LIVING: [facility] ] Home, [ ]  Nursing home, [ ]  Homeless  AMB STATUS: Arly.Keller ] Walking, [ ]  Walking w/ Assistance, [ ]  Wheelchair, [ ] Bed ridden  RECENT HEART ATTACK (<6 mon): No  CAD Sx: [ ]  No, [ ]  Asx, h/o MI, Arly.Keller ] Stable angina, [ ]  Unstable  angina  PRIOR CHF: Arly.Keller ] No, [ ]  Asx, [ ]  Mild, [ ]  Moderate, [ ]  Severe  STRESS TEST: Arly.Keller ] No, [ ]  Normal, [ ]  + ischemia, [ ]  + MI, [ ]  Both  SOCIAL HISTORY: History  Substance Use Topics  . Smoking status: Former Smoker -- 1.50 packs/day for 50 years    Types: Cigarettes    Quit date: 11/08/1995  . Smokeless tobacco: Never Used  . Alcohol Use: No     Comment: quit drinking back in 1977   No Known Allergies  Current Outpatient Prescriptions  Medication Sig Dispense Refill  . acetaminophen (TYLENOL) 500 MG tablet Take 500 mg by mouth daily. As needed      . ALPRAZolam (XANAX) 0.25 MG tablet 1/2 tablet to 1 tablet by mouth three times a day as needed for nerves.  90 tablet  5  . clotrimazole-betamethasone (LOTRISONE) cream Apply to rash two times daily  30 g  5  . dextromethorphan-guaiFENesin (MUCINEX DM) 30-600 MG per 12 hr tablet Take 1 tablet by mouth every 12 (twelve) hours.        Marland Kitchen donepezil (ARICEPT) 10 MG tablet TAKE 1 TABLET BY MOUTH DAILY - MAY TAKE IN MORNING  90 tablet  1  . fesoterodine (TOVIAZ) 4 MG TB24 Take 4 mg by  mouth daily.      . finasteride (PROSCAR) 5 MG tablet Take 5 mg by mouth daily.        . furosemide (LASIX) 20 MG tablet Take 1 tablet (20 mg total) by mouth daily as needed.  30 tablet  2  . latanoprost (XALATAN) 0.005 % ophthalmic solution Place 1 drop into both eyes daily. Ordered by Dr Eulah Pont      . lisinopril (PRINIVIL,ZESTRIL) 20 MG tablet Take 1 tablet (20 mg total) by mouth daily.  30 tablet  11  . NAMENDA XR 28 MG CP24 TAKE  ONE CAPSULE BY MOUTH EVERY MORNING  30 capsule  6  . QUEtiapine (SEROQUEL) 50 MG tablet TAKE TWO TABLETS BY MOUTH DAILY  180 tablet  1  . simvastatin (ZOCOR) 40 MG tablet Take 1 tablet (40 mg total) by mouth at bedtime.  30 tablet  6  . traMADol (ULTRAM) 50 MG tablet Take 1 tablet (50 mg total) by mouth 3 (three) times daily as needed for pain.  90 tablet  5  . warfarin (COUMADIN) 5 MG tablet Take as directed by  Anticoagulation Clinic  35 tablet  3   No current facility-administered medications for this visit.   REVIEW OF SYSTEMS: Arly.Keller ] denotes positive finding; [  ] denotes negative finding CARDIOVASCULAR:  Arly.Keller ] chest pain last week.   [ ]  chest pressure   [ ]  palpitations   [ ]  orthopnea   [ ]  dyspnea on exertion   [ ]  claudication   [ ]  rest pain   [ ]  DVT   [ ]  phlebitis PULMONARY:   [ ]  productive cough   [ ]  asthma   [ ]  wheezing NEUROLOGIC:   [ ]  weakness  [ ]  paresthesias  [ ]  aphasia  [ ]  amaurosis  [ ]  dizziness HEMATOLOGIC:   [ ]  bleeding problems   [ ]  clotting disorders MUSCULOSKELETAL:  [ ]  joint pain   [ ]  joint swelling [ ]  leg swelling GASTROINTESTINAL: [ ]   blood in stool  [ ]   hematemesis GENITOURINARY:  [ ]   dysuria  [ ]   hematuria PSYCHIATRIC:  [ ]  history of major depression INTEGUMENTARY:  [ ]  rashes  [ ]  ulcers CONSTITUTIONAL:  [ ]  fever   [ ]  chills  PHYSICAL EXAM: Filed Vitals:   06/05/13 1406 06/05/13 1407  BP: 172/88 154/79  Pulse: 70 78  Resp: 16   Height: 6' (1.829 m)   Weight: 215 lb (97.523 kg)   SpO2: 97%    Body mass index is 29.15 kg/(m^2). GENERAL: The patient is a well-nourished male, in no acute distress. The vital signs are documented above. CARDIOVASCULAR: He has an irregular rhythm.. I do not detect carotid bruits. PULMONARY: There is good air exchange bilaterally without wheezing or rales. ABDOMEN: Soft and non-tender with normal pitched bowel sounds.  MUSCULOSKELETAL: There are no major deformities or cyanosis. NEUROLOGIC: No focal weakness or paresthesias are detected. SKIN: There are no ulcers or rashes noted. PSYCHIATRIC: The patient has a normal affect.  DATA:  Lab Results  Component Value Date   WBC 7.0 04/25/2013   HGB 14.5 04/25/2013   HCT 44.3 04/25/2013   MCV 98.8 04/25/2013   PLT 187.0 04/25/2013   Lab Results  Component Value Date   CREATININE 1.1 05/02/2013   Lab Results  Component Value Date   INR 1.7 05/27/2013   INR 2.2  04/15/2013   INR 2.3 03/11/2013   PROTIME 18.8 04/17/2009  CAROTID DUPLEX: I have reviewed his carotid duplex scan which shows a very tight left carotid stenosis. This is greater than 80%. The peak systolic velocity of 676 cm/s with end-diastolic velocity 175 cm/s. He has no evidence of recurrent carotid stenosis on the right. Both vertebral arteries are patent with antegrade flow.  MEDICAL ISSUES:  GREATER THAN 80% LEFT CAROTID STENOSIS (ASYMPTOMATIC): This patient has a very tight left carotid stenosis which is asymptomatic. I've explained that we can lower his risk of future stroke with elective left carotid endarterectomy. Patient is 31 with multiple medical comorbidities and some underlying dementia. Thus he is not an ideal candidate for carotid endarterectomy. He is scheduled to see [facility] cardiology next week given his recent history of chest pain. The patient at this point is not sure if he wants to proceed with surgery explained that I do not think it is unreasonable if he elects not to proceed with elective left carotid endarterectomy given that I think he would be at increased risk because of his comorbidities. However if he is felt to be a reasonable candidate from a cardiac standpoint, then I think we could consider left carotid endarterectomy. We would have to stop his Coumadin 5 days preoperatively. This was discussed with the patient and his wife today.  DICKSON,CHRISTOPHER S [facility] [place] Beeper: [phone]

## 2013-06-27 NOTE — Preoperative (Signed)
Beta Blockers   Reason not to administer Beta Blockers:Not Applicable 

## 2013-06-27 NOTE — Progress Notes (Signed)
Kerry [place] paged about pt having a pacemaker, also called Dr Katrinka Blazing and informed of awaiting call from [place].  Dr Katrinka Blazing aware no new orders.

## 2013-06-27 NOTE — Interval H&P Note (Signed)
History and Physical Interval Note:  06/27/2013 10:56 AM  Ricky [name]  has presented today for surgery, with the diagnosis of LEFT ICA STENOSIS  The various methods of treatment have been discussed with the patient and family. After consideration of risks, benefits and other options for treatment, the patient has consented to  Procedure(s): ENDARTERECTOMY CAROTID (Left) as a surgical intervention .  The patient's history has been reviewed, patient examined, no change in status, stable for surgery.  I have reviewed the patient's chart and labs.  Questions were answered to the patient's satisfaction.     DICKSON,CHRISTOPHER S

## 2013-06-27 NOTE — Anesthesia Procedure Notes (Signed)
Procedure Name: Intubation Date/Time: 06/27/2013 11:36 AM Performed by: Brien Mates DOBSON Pre-anesthesia Checklist: Patient identified, Emergency Drugs available, Suction available, Patient being monitored and Timeout performed Patient Re-evaluated:Patient Re-evaluated prior to inductionOxygen Delivery Method: Circle system utilized Preoxygenation: Pre-oxygenation with 100% oxygen Intubation Type: IV induction Ventilation: Mask ventilation without difficulty and Oral airway inserted - appropriate to patient size Laryngoscope Size: 2 and Miller Grade View: Grade I Tube type: Oral Tube size: 7.5 mm Number of attempts: 1 Airway Equipment and Method: Stylet Placement Confirmation: ETT inserted through vocal cords under direct vision,  positive ETCO2 and breath sounds checked- equal and bilateral Secured at: 22 cm Tube secured with: Tape Dental Injury: Teeth and Oropharynx as per pre-operative assessment

## 2013-06-27 NOTE — Transfer of Care (Signed)
Immediate Anesthesia Transfer of Care Note  Patient: Ricky [name]  Procedure(s) Performed: Procedure(s): Left Carotid Endarterectomy with Finesse patch angioplasty (Left)  Patient Location: PACU  Anesthesia Type:General  Level of Consciousness: awake  Airway & Oxygen Therapy: Patient Spontanous Breathing and Patient connected to face mask oxygen  Post-op Assessment: Report given to PACU RN and Post -op Vital signs reviewed and stable  Post vital signs: Reviewed and stable  Complications: No apparent anesthesia complications

## 2013-06-27 NOTE — Op Note (Signed)
NAME: Ricky [name]    MRN: [id number] DOB: September 08, 1931    DATE OF OPERATION: 06/27/2013  PREOP DIAGNOSIS: Greater than 80% left carotid stenosis  POSTOP DIAGNOSIS: Same  PROCEDURE: Left carotid endarterectomy with Dacron patch angioplasty  SURGEON: Di Kindle. Edilia Bo, MD, FACS  ASSIST: Della Goo PA  ANESTHESIA: Gen.   EBL: minimal  INDICATIONS: Ricky [name] is a 79 y.o. male who has undergone previous right carotid endarterectomy. He had a left carotid stenosis which progressed to greater than 80%. He wished to proceed with left carotid endarterectomy in order to lower his risk of future stroke.  FINDINGS: 90% diffuse plaque extending down the common carotid artery and also at the internal carotid artery.  TECHNIQUE: The patient was taken to the operating room and received a general anesthetic. Arterial line had been placed by anesthesia. After careful positioning, the left neck and chest were prepped and draped in usual sterile fashion. An incision was made along the anterior border of the sternocleidomastoid and the dissection carried down to the common carotid artery which was dissected free and controlled with Rummel tourniquet. The facial vein was divided between 2-0 silk ties. The internal carotid artery was controlled above the plaque. The plaque extended fairly high. The external carotid artery and superior thyroid arteries were also controlled. The patient was heparinized. And placed on the internal then the common then the external carotid artery. A longitudinal arteriotomy was made in the common carotid artery. This was extended through the plaque into the internal carotid artery above the plaque. A 12 shunt was placed into the internal carotid artery, backbled and then placed into the common carotid artery and secured with Rummel tourniquet. Flow was reestablished through the shunt. An endarterectomy plane was established proximally and the plaque was sharply divided. Eversion  endarterectomy was performed of the external carotid artery. Distally there was a nice tapering the plaque and no tacking sutures were required. The artery was irrigated with copious amounts of heparin and dextran all loose debris removed. A Dacron patch was then sewn using continuous 6-0 Prolene suture. Prior to completing the patch closure, the shunt was removed. The artery was backbled and flushed appropriately and the anastomosis completed. Flow was reestablished first to the external carotid artery and into the internal carotid artery. At the completion was an excellent Doppler signal in the internal carotid artery with good diastolic flow. There was a good pulse. The heparin was partially reversed with protamine. Hemostasis was obtained in the wound. The wounds closed the deep layer 3-0 Vicryl. The platysma was closed with running 3-0 Vicryl. The skin was closed with 4-0 subcuticular stitch. Dermabond was applied. The patient will neurologically intact. All needle and sponge counts were correct. He was transferred to the recovery room in stable condition.  Waverly Ferrari, MD, FACS [facility]  DATE OF DICTATION:   06/27/2013

## 2013-06-28 ENCOUNTER — Encounter (HOSPITAL_COMMUNITY): Payer: Self-pay | Admitting: Vascular Surgery

## 2013-06-28 LAB — BASIC METABOLIC PANEL
BUN: 16 mg/dL (ref 6–23)
CO2: 26 mEq/L (ref 19–32)
Chloride: 100 mEq/L (ref 96–112)
GFR calc non Af Amer: 76 mL/min — ABNORMAL LOW (ref >90)
Glucose, Bld: 131 mg/dL — ABNORMAL HIGH (ref 70–99)
Potassium: 4.3 mEq/L (ref 3.5–5.1)
Sodium: 134 mEq/L — ABNORMAL LOW (ref 135–145)

## 2013-06-28 LAB — PROTIME-INR
INR: 1.26 (ref 0.00–1.49)
Prothrombin Time: 15.5 seconds — ABNORMAL HIGH (ref 11.6–15.2)

## 2013-06-28 LAB — CBC
HCT: 36.7 % — ABNORMAL LOW (ref 39.0–52.0)
Hemoglobin: 12.3 g/dL — ABNORMAL LOW (ref 13.0–17.0)
MCHC: 33.5 g/dL (ref 30.0–36.0)
RBC: 3.84 MIL/uL — ABNORMAL LOW (ref 4.22–5.81)

## 2013-06-28 NOTE — Progress Notes (Addendum)
VASCULAR AND VEIN SURGERY POST - OP CEA PROGRESS NOTE  Date of Surgery: 06/27/2013  Surgeon(s): Chuck Hint, MD 1 Day Post-Op left CEA .  HPI: Ricky [name] is a 79 y.o. male who is 1 Day Post-Op . Patient is doing well.  Patient denies headache; Patient denies difficulty swallowing; denies weakness in upper or lower extremities; Pt. denies other symptoms of stroke or TIA.  IMAGING: None   Significant Diagnostic Studies: CBC Lab Results  Component Value Date   WBC 7.7 06/28/2013   HGB 12.3* 06/28/2013   HCT 36.7* 06/28/2013   MCV 95.6 06/28/2013   PLT 149* 06/28/2013    BMET    Component Value Date/Time   NA 134* 06/28/2013 0500   K 4.3 06/28/2013 0500   CL 100 06/28/2013 0500   CO2 26 06/28/2013 0500   GLUCOSE 131* 06/28/2013 0500   BUN 16 06/28/2013 0500   CREATININE 0.98 06/28/2013 0500   CALCIUM 8.7 06/28/2013 0500   GFRNONAA 76* 06/28/2013 0500   GFRAA 88* 06/28/2013 0500    COAG Lab Results  Component Value Date   INR 1.26 06/28/2013   INR 1.09 06/25/2013   INR 1.3 06/17/2013   PROTIME 18.8 04/17/2009   No results found for this basename: PTT      Intake/Output Summary (Last 24 hours) at 06/28/13 0737 Last data filed at 06/28/13 0600  Gross per 24 hour  Intake   2330 ml  Output    550 ml  Net   1780 ml    Physical Exam:  BP Readings from Last 3 Encounters:  06/28/13 154/61  06/28/13 154/61  06/25/13 130/69   Temp Readings from Last 3 Encounters:  06/28/13 98.4 F (36.9 C) Oral  06/28/13 98.4 F (36.9 C) Oral  06/25/13 99.1 F (37.3 C)    SpO2 Readings from Last 3 Encounters:  06/28/13 93%  06/28/13 93%  06/25/13 95%   Pulse Readings from Last 3 Encounters:  06/28/13 70  06/28/13 70  06/25/13 77    Pt is A&O x 3 Gait is normal Speech is fluent left Neck Wound is healing well Patient with Negative tongue deviation and Negative facial droop Upper lip appears dev to right and mouth may be slightly flat on left but this may be  unchanged for him Pt has good and equal strength in all extremities  Assessment/Plan:: Ricky [name] is a 79 y.o. male is S/P Left CEA Pt is voiding, ambulating and taking po well    Discharge to: Home Follow-up in 2 weeks   ROCZNIAK,REGINA J  06/28/2013 7:37 AM  The patient is doing well 1 day status post left carotid endarterectomy. He has yet to get out of bed or attempt oral intake.  On physical exam, his left carotid incision is healing nicely. There is a slight marginal mandibular neurapraxia, otherwise he is neurologically intact.  The patient's diet will be advanced and he will be assisted with mobilization. Assuming these are not issues later today, he will be able to be discharged.  Durene Cal

## 2013-06-28 NOTE — Progress Notes (Signed)
Patients Artrial line discontinue per order.

## 2013-06-28 NOTE — Discharge Summary (Signed)
[facility] Discharge Summary   Patient ID:  Ricky [name] MRN: [id number] DOB/AGE: Jun 26, 1931 79 y.o.  Admit date: 06/27/2013 Discharge date: 06/28/2013 Date of Surgery: 06/27/2013 Surgeon: Surgeon(s): Chuck Hint, MD  Admission Diagnosis: LEFT ICA STENOSIS  Discharge Diagnoses:  LEFT ICA STENOSIS  Secondary Diagnoses: Past Medical History  Diagnosis Date  . Obstructive sleep apnea   . Ischemic cardiomyopathy   . Atrial fibrillation   . AV block, complete s/p ablation   . Pacemaker [place]   . Cerebrovascular disease   . Hypercholesterolemia   . Hiatal hernia   . Gastritis   . Diverticulosis   . Colonic polyp   . BPH (benign prostatic hypertrophy)   . DJD (degenerative joint disease)   . Memory loss   . Anxiety   . Anemia   . Shingles   . Carotid artery occlusion   . Hypertension   . Pneumonia     hx of  . Bruises easily     Procedure(s): Left Carotid Endarterectomy with Finesse patch angioplasty  Discharged Condition: good  HPI:  Ricky [name] is a 79 y.o. male was here for a routine carotid duplex scan last week. He has undergone previous right carotid endarterectomy in 2004. The right carotid endarterectomy site was widely patent. The carotid stenosis on the left however it progressed to greater than 80%. Previous study 6 months ago which showed a 60-79% left carotid stenosis. The carotid stenosis was asymptomatic. The patient had been having chest pain so he was to go to the cardiologist office for evaluation. He comes back today to discuss these results.  States that his chest pain resolved and he is had no further episodes of chest pain. He is scheduled to see [facility] cardiology on 06/12/2013. Of note he is on Coumadin for a history of atrial fibrillation. He denies any history of stroke, TIAs, expressive or receptive aphasia, or amaurosis fugax. Of note he does have some history of dementia. He is not on aspirin. He is on a  statin. GREATER THAN 80% LEFT CAROTID STENOSIS (ASYMPTOMATIC): This patient has a very tight left carotid stenosis which is asymptomatic. Dr. Edilia Bo explained that we can lower his risk of future stroke with elective left carotid endarterectomy.  Pt was seen by cardiology and is aware of his increased risk from cardiac standpoint. Pt has elected to proceed with surgery  Hospital Course:  [name] is a 79 y.o. male is S/P Left Carotid Endarterectomy with Finesse patch angioplasty Extubated: POD # 0 Physical exam: Neurologically intact No tongue dev; no facial droop although there is some flattening of mouth on left side Good and equal strength bilat Post-op wounds healing well Pt. Ambulating, voiding and taking PO diet without difficulty. Pt pain controlled with PO pain meds. Labs as below Complications:none  Consults:     Significant Diagnostic Studies: CBC Lab Results  Component Value Date   WBC 7.7 06/28/2013   HGB 12.3* 06/28/2013   HCT 36.7* 06/28/2013   MCV 95.6 06/28/2013   PLT 149* 06/28/2013    BMET    Component Value Date/Time   NA 134* 06/28/2013 0500   K 4.3 06/28/2013 0500   CL 100 06/28/2013 0500   CO2 26 06/28/2013 0500   GLUCOSE 131* 06/28/2013 0500   BUN 16 06/28/2013 0500   CREATININE 0.98 06/28/2013 0500   CALCIUM 8.7 06/28/2013 0500   GFRNONAA 76* 06/28/2013 0500   GFRAA 88* 06/28/2013 0500   COAG Lab  Results  Component Value Date   INR 1.26 06/28/2013   INR 1.09 06/25/2013   INR 1.3 06/17/2013   PROTIME 18.8 04/17/2009     Disposition:  Discharge to :Home Discharge Orders   Future Appointments Provider Department Dept Phone   07/04/2013 11:15 AM [facility] [phone]   07/10/2013 9:00 AM Chuck Hint, MD [facility] [phone]   08/15/2013 8:45 AM Duke Salvia, MD [facility] Main [facility] [place]) [phone]   09/16/2013 10:30 AM Michele Mcalpine, MD [facility] [phone]   09/18/2013 2:30 PM Melvyn Novas, MD [facility] [phone]   Future Orders Complete By Expires   Call MD for:  redness, tenderness, or signs of infection (pain, swelling, bleeding, redness, odor or green/yellow discharge around incision site)  As directed    Call MD for:  severe or increased pain, loss or decreased feeling  in affected limb(s)  As directed    Call MD for:  temperature >100.5  As directed    CAROTID Sugery: Call MD for difficulty swallowing or speaking; weakness in arms or legs that is a new symtom; severe headache.  If you have increased swelling in the neck and/or  are having difficulty breathing, CALL [phone]  As directed    Discharge patient  As directed    Comments:     Discharge pt to home after seen by MD   Driving Restrictions  As directed    Comments:     No driving until after seen by MD   Increase activity slowly  As directed    Comments:     Walk with assistance use walker or cane as needed   Lifting restrictions  As directed    Comments:     No heavy lifting for 4 weeks   May shower   As directed    Scheduling Instructions:     Saturday   may wash over wound with mild soap and water  As directed    No dressing needed  As directed    Resume previous diet  As directed        Medication List         acetaminophen 500 MG tablet  Commonly known as:  TYLENOL  Take 500 mg by mouth daily. As needed     ALPRAZolam 0.25 MG tablet  Commonly known as:  XANAX  Take 0.25 mg by mouth daily. 1/2 tablet to 1 tablet by mouth three times a day as needed for nerves.     clotrimazole-betamethasone cream  Commonly known as:  LOTRISONE  Apply to rash two times daily     dextromethorphan-guaiFENesin 30-600 MG per 12 hr tablet  Commonly known as:  MUCINEX DM  Take 1 tablet by mouth every 12 (twelve) hours.     donepezil 10 MG tablet  Commonly known as:  ARICEPT  TAKE 1 TABLET BY MOUTH DAILY - MAY TAKE IN MORNING      finasteride 5 MG tablet  Commonly known as:  PROSCAR  Take 5 mg by mouth daily.     latanoprost 0.005 % ophthalmic solution  Commonly known as:  XALATAN  Place 1 drop into both eyes daily. Ordered by Dr Eulah Pont     lisinopril 20 MG tablet  Commonly known as:  PRINIVIL,ZESTRIL  Take 1 tablet (20 mg total) by mouth daily.     NAMENDA XR 28 MG Cp24  Generic drug:  Memantine HCl ER  TAKE  ONE CAPSULE BY MOUTH EVERY MORNING     QUEtiapine 50 MG tablet  Commonly known as:  SEROQUEL  TAKE TWO TABLETS BY MOUTH DAILY     simvastatin 40 MG tablet  Commonly known as:  ZOCOR  Take 1 tablet (40 mg total) by mouth at bedtime.     TOVIAZ 4 MG Tb24 tablet  Generic drug:  fesoterodine  Take 4 mg by mouth daily.     traMADol 50 MG tablet  Commonly known as:  ULTRAM  Take 1 tablet (50 mg total) by mouth 3 (three) times daily as needed for pain.     warfarin 5 MG tablet  Commonly known as:  COUMADIN  Take 2.5-5 mg by mouth daily. Take as directed by Anticoagulation Clinic take one whole tablet on Tuesdays, Thursday and saturdays and half a tab rest of days       Verbal and written Discharge instructions given to the patient. Wound care per Discharge AVS     Follow-up Information   Follow up with DICKSON,CHRISTOPHER S, MD In 2 weeks. (office will arrange-sent)    Specialty:  Vascular Surgery   Contact information:   [address] [place] [address] [phone]       Signed: Marlowe Shores 06/28/2013, 7:43 AM  --- For VQI Registry use --- Instructions: Press F2 to tab through selections.  Delete question if not applicable.   Modified Rankin score at D/C (0-6): Rankin Score=0  IV medication needed for:  1. Hypertension: No 2. Hypotension: No  Post-op Complications: No  1. Post-op CVA or TIA: No 2. CN injury: No  3. Myocardial infarction: No  4.  CHF: No  5.  Dysrhythmia (new): No  6. Wound infection: No  7. Reperfusion symptoms: No  8. Return to OR:  No    Discharge medications: Statin use:  Yes ASA use: No Beta blocker use:  Yes ACE-Inhibitor use:  Yes P2Y12 Antagonist use: [x ] None, [ ]  Plavix, [ ]  Plasugrel, [ ]  Ticlopinine, [ ]  Ticagrelor, [ ]  Other, [ ]  No for medical reason, [ ]  Non-compliant, [ ]  Not-indicated Anti-coagulant use:  [ ]  None, [x ] Warfarin, [ ]  Rivaroxaban, [ ]  Dabigatran, [ ]  Other, [ ]  No for medical reason, [ ]  Non-compliant, [ ]  Not-indicated

## 2013-06-28 NOTE — Progress Notes (Signed)
Pt to d/c this am. Reviewed d/c instructions, when to call DR, Rx, coumadin & clinic, etc. Pt and family understood.

## 2013-06-28 NOTE — Progress Notes (Signed)
Utilization Review Completed.Thara Searing T8/22/2014  

## 2013-07-01 ENCOUNTER — Other Ambulatory Visit: Payer: Self-pay | Admitting: Pulmonary Disease

## 2013-07-01 NOTE — Discharge Summary (Signed)
Agree with plans for D/C  Waverly Ferrari, MD, [facility] Beeper [phone] 07/01/2013

## 2013-07-04 ENCOUNTER — Encounter (HOSPITAL_COMMUNITY): Payer: Self-pay | Admitting: Adult Health

## 2013-07-04 ENCOUNTER — Emergency Department (HOSPITAL_COMMUNITY): Payer: Medicare Other

## 2013-07-04 DIAGNOSIS — I4891 Unspecified atrial fibrillation: Secondary | ICD-10-CM | POA: Insufficient documentation

## 2013-07-04 DIAGNOSIS — Z950 Presence of cardiac pacemaker: Secondary | ICD-10-CM | POA: Insufficient documentation

## 2013-07-04 DIAGNOSIS — M1990 Unspecified osteoarthritis, unspecified site: Secondary | ICD-10-CM | POA: Insufficient documentation

## 2013-07-04 DIAGNOSIS — Z8619 Personal history of other infectious and parasitic diseases: Secondary | ICD-10-CM | POA: Insufficient documentation

## 2013-07-04 DIAGNOSIS — I10 Essential (primary) hypertension: Secondary | ICD-10-CM | POA: Insufficient documentation

## 2013-07-04 DIAGNOSIS — K5900 Constipation, unspecified: Secondary | ICD-10-CM | POA: Insufficient documentation

## 2013-07-04 DIAGNOSIS — Z951 Presence of aortocoronary bypass graft: Secondary | ICD-10-CM | POA: Insufficient documentation

## 2013-07-04 DIAGNOSIS — Z7901 Long term (current) use of anticoagulants: Secondary | ICD-10-CM | POA: Insufficient documentation

## 2013-07-04 DIAGNOSIS — Z8669 Personal history of other diseases of the nervous system and sense organs: Secondary | ICD-10-CM | POA: Insufficient documentation

## 2013-07-04 DIAGNOSIS — Z860100 Personal history of colon polyps, unspecified: Secondary | ICD-10-CM | POA: Insufficient documentation

## 2013-07-04 DIAGNOSIS — Z862 Personal history of diseases of the blood and blood-forming organs and certain disorders involving the immune mechanism: Secondary | ICD-10-CM | POA: Insufficient documentation

## 2013-07-04 DIAGNOSIS — Z79899 Other long term (current) drug therapy: Secondary | ICD-10-CM | POA: Insufficient documentation

## 2013-07-04 DIAGNOSIS — E7800 Pure hypercholesterolemia, unspecified: Secondary | ICD-10-CM | POA: Insufficient documentation

## 2013-07-04 DIAGNOSIS — Z8719 Personal history of other diseases of the digestive system: Secondary | ICD-10-CM | POA: Insufficient documentation

## 2013-07-04 DIAGNOSIS — Z8701 Personal history of pneumonia (recurrent): Secondary | ICD-10-CM | POA: Insufficient documentation

## 2013-07-04 DIAGNOSIS — N400 Enlarged prostate without lower urinary tract symptoms: Secondary | ICD-10-CM | POA: Insufficient documentation

## 2013-07-04 DIAGNOSIS — F411 Generalized anxiety disorder: Secondary | ICD-10-CM | POA: Insufficient documentation

## 2013-07-04 DIAGNOSIS — Z87891 Personal history of nicotine dependence: Secondary | ICD-10-CM | POA: Insufficient documentation

## 2013-07-04 DIAGNOSIS — Z8679 Personal history of other diseases of the circulatory system: Secondary | ICD-10-CM | POA: Insufficient documentation

## 2013-07-04 NOTE — ED Notes (Addendum)
Presents with inability to have a bowel movement for one week, denies pain, denies nausea, reports small tiny ball BMs.  Laxatives are not helping.

## 2013-07-05 ENCOUNTER — Emergency Department (HOSPITAL_COMMUNITY)
Admission: EM | Admit: 2013-07-05 | Discharge: 2013-07-05 | Disposition: A | Discharge status: Home / Self Care | Payer: Medicare Other | Attending: Emergency Medicine | Admitting: Emergency Medicine

## 2013-07-05 ENCOUNTER — Telehealth: Payer: Self-pay

## 2013-07-05 ENCOUNTER — Ambulatory Visit (INDEPENDENT_AMBULATORY_CARE_PROVIDER_SITE_OTHER): Payer: Medicare Other

## 2013-07-05 DIAGNOSIS — Z7901 Long term (current) use of anticoagulants: Secondary | ICD-10-CM

## 2013-07-05 DIAGNOSIS — I4891 Unspecified atrial fibrillation: Secondary | ICD-10-CM

## 2013-07-05 DIAGNOSIS — I679 Cerebrovascular disease, unspecified: Secondary | ICD-10-CM

## 2013-07-05 DIAGNOSIS — K5900 Constipation, unspecified: Secondary | ICD-10-CM

## 2013-07-05 LAB — POCT INR: INR: 1.7

## 2013-07-05 MED ORDER — MAGNESIUM CITRATE PO SOLN
1.0000 | Freq: Once | ORAL | Status: AC
  Administered 2013-07-05: 1 via ORAL
  Filled 2013-07-05: qty 296

## 2013-07-05 MED ORDER — DOCUSATE SODIUM 100 MG PO CAPS: 100.0000 mg | ORAL_CAPSULE | Freq: Two times a day (BID) | ORAL | Status: AC

## 2013-07-05 MED ORDER — FLEET ENEMA 7-19 GM/118ML RE ENEM
1.0000 | ENEMA | Freq: Once | RECTAL | Status: AC
  Administered 2013-07-05: 1 via RECTAL
  Filled 2013-07-05: qty 1

## 2013-07-05 MED ORDER — POLYETHYLENE GLYCOL 3350 17 G PO PACK: 17.0000 g | PACK | Freq: Every day | ORAL | Status: DC

## 2013-07-05 MED ORDER — MEMANTINE HCL 10 MG PO TABS: 10.0000 mg | ORAL_TABLET | Freq: Two times a day (BID) | ORAL | Status: DC

## 2013-07-05 NOTE — ED Notes (Addendum)
Pt dc to home with family. Pt stated he wanted to walk out to his car. On the way he then felt weak and this nurse got him a wheel chair.  Pt then taken and helped into car.  [place].

## 2013-07-05 NOTE — ED Provider Notes (Signed)
CSN: [id number]     Arrival date & time 07/04/13  2115 History   First MD Initiated Contact with Patient 07/05/13 0126     Chief Complaint  Patient presents with  . Constipation   (Consider location/radiation/quality/duration/timing/severity/associated sxs/prior Treatment) HPI 79 yo male presents to the ER from home with family with complaint of constipation.  Pt recently had left sided CEA done, and since that time has not been able to have good bm.  He reports only small hard balls.  Wife reports she gave him ducolax and milk of magnesia without improvement.  No abd pain, no n/v, no abd distension.   Past Medical History  Diagnosis Date  . Obstructive sleep apnea   . Ischemic cardiomyopathy   . Atrial fibrillation   . AV block, complete s/p ablation   . Pacemaker [place]   . Cerebrovascular disease   . Hypercholesterolemia   . Hiatal hernia   . Gastritis   . Diverticulosis   . Colonic polyp   . BPH (benign prostatic hypertrophy)   . DJD (degenerative joint disease)   . Memory loss   . Anxiety   . Anemia   . Shingles   . Carotid artery occlusion   . Hypertension   . Pneumonia     hx of  . Bruises easily    Past Surgical History  Procedure Laterality Date  . Pacemaker insertion    . Coronary artery bypass graft      x4 by Dr. Sena Hitch 9/00  . Carotid endarterectomy      right 10/04 Dr. Edilia Bo  . Anal fissure repair    . Hiatal hernia repair      incarcerated stomach with takedown and repair of hiatus and gastropexy  . Insert / replace / remove pacemaker    . Endarterectomy Left 06/27/2013    Procedure: Left Carotid Endarterectomy with Finesse patch angioplasty;  Surgeon: Chuck Hint, MD;  [facility];  Service: Vascular;  Laterality: Left;   Family History  Problem Relation Age of Onset  . Coronary artery disease Other    History  Substance Use Topics  . Smoking status: Former Smoker -- 1.50 packs/day for 50 years    Types: Cigarettes    Quit  date: 11/08/1995  . Smokeless tobacco: Never Used  . Alcohol Use: No     Comment: quit drinking back in 1977    Review of Systems  All other systems reviewed and are negative.    Allergies  Review of patient's allergies indicates no known allergies.  Home Medications   Current Outpatient Rx  Name  Route  Sig  Dispense  Refill  . acetaminophen (TYLENOL) 500 MG tablet   Oral   Take 500 mg by mouth every 6 (six) hours as needed for pain.          Marland Kitchen ALPRAZolam (XANAX) 0.25 MG tablet   Oral   Take 0.125-0.25 mg by mouth 3 (three) times daily as needed for anxiety.          . clotrimazole-betamethasone (LOTRISONE) cream   Topical   Apply 1 application topically 2 (two) times daily. Applies to legs         . dextromethorphan-guaiFENesin (MUCINEX DM) 30-600 MG per 12 hr tablet   Oral   Take 1 tablet by mouth every 12 (twelve) hours.           Marland Kitchen donepezil (ARICEPT) 10 MG tablet   Oral   Take 10 mg by mouth  every morning.         . fesoterodine (TOVIAZ) 4 MG TB24   Oral   Take 4 mg by mouth daily.         . finasteride (PROSCAR) 5 MG tablet   Oral   Take 5 mg by mouth daily.           Marland Kitchen latanoprost (XALATAN) 0.005 % ophthalmic solution   Both Eyes   Place 1 drop into both eyes daily.          Marland Kitchen lisinopril (PRINIVIL,ZESTRIL) 20 MG tablet   Oral   Take 1 tablet (20 mg total) by mouth daily.   30 tablet   11   . Memantine HCl ER (NAMENDA XR) 28 MG CP24   Oral   Take 28 mg by mouth every morning.         Marland Kitchen QUEtiapine (SEROQUEL) 50 MG tablet   Oral   Take 100 mg by mouth daily.         . simvastatin (ZOCOR) 40 MG tablet   Oral   Take 40 mg by mouth at bedtime.         . traMADol (ULTRAM) 50 MG tablet   Oral   Take 1 tablet (50 mg total) by mouth 3 (three) times daily as needed for pain.   90 tablet   5   . warfarin (COUMADIN) 5 MG tablet   Oral   Take 2.5-5 mg by mouth daily. one whole tablet on Tuesdays, Thursday and saturdays and  half a tab rest of days         . docusate sodium (COLACE) 100 MG capsule   Oral   Take 1 capsule (100 mg total) by mouth every 12 (twelve) hours.   60 capsule   0   . polyethylene glycol (MIRALAX / GLYCOLAX) packet   Oral   Take 17 g by mouth daily. Take twice a day until having loose stools, then decrease to once a day   30 each   0    BP 175/79  Pulse 90  Temp(Src) 99.3 F (37.4 C) (Oral)  Resp 20  Wt 212 lb (96.163 kg)  BMI 28.75 kg/m2  SpO2 97% Physical Exam  Nursing note and vitals reviewed. Constitutional: He is oriented to person, place, and time. He appears well-developed and well-nourished.  HENT:  Head: Normocephalic and atraumatic.  Nose: Nose normal.  Mouth/Throat: Oropharynx is clear and moist.  Eyes: Conjunctivae and EOM are normal. Pupils are equal, round, and reactive to light.  Neck: Normal range of motion. Neck supple. No JVD present. No tracheal deviation present. No thyromegaly present.  Left sided surgical scar noted, CDI  Cardiovascular: Normal rate, regular rhythm, normal heart sounds and intact distal pulses.  Exam reveals no gallop and no friction rub.   No murmur heard. Pulmonary/Chest: Effort normal and breath sounds normal. No stridor. No respiratory distress. He has no wheezes. He has no rales. He exhibits no tenderness.  Abdominal: Soft. Bowel sounds are normal. He exhibits no distension and no mass. There is no tenderness. There is no rebound and no guarding.  Genitourinary: Rectum normal, prostate normal and penis normal.  No fecal impaction, scant soft stool in vault  Musculoskeletal: Normal range of motion. He exhibits no edema and no tenderness.  Lymphadenopathy:    He has no cervical adenopathy.  Neurological: He is alert and oriented to person, place, and time. He exhibits normal muscle tone. Coordination normal.  Skin: Skin is  warm and dry. No rash noted. No erythema. No pallor.  Psychiatric: He has a normal mood and affect. His  behavior is normal. Judgment and thought content normal.    ED Course  Procedures (including critical care time) Labs Review Labs Reviewed - No data to display Imaging Review Dg Abd 2 Views  07/04/2013   *RADIOLOGY REPORT*  Clinical Data: Constipation, abdominal discomfort  ABDOMEN - 2 VIEW  Comparison: 02/08/2008  Findings: Nonobstructive bowel gas pattern.  Surgical clips project over the left upper quadrant.  Visualized portions of the lung bases are predominately clear.  Partially imaged pacing wire projecting over the right ventricle.  Median sternotomy wires. Atherosclerotic vascular calcifications.  Osteopenia and multilevel degenerative change.  Rightward curvature of the lumbar spine.  IMPRESSION: Nonobstructive bowel gas pattern.   Original Report Authenticated By: Jearld Lesch, M.D.    MDM   1. Constipation    79 yo male with one week of hard bm.  Will order magnesium citrate, fleets enema here.  Will start on miralax and colace for home use.  To f/u with pcm if not improving.    Olivia Mackie, MD 07/05/13 [phone]

## 2013-07-05 NOTE — Telephone Encounter (Signed)
[facility] called requesting a Rx for regular Namenda since the XR version is on back order.

## 2013-07-05 NOTE — ED Notes (Signed)
Pt taken to br and fleets enema given.  Pt had moderate amount of stool noted. Pt sts feels much better. sts is ready for discharge. md aware.

## 2013-07-09 ENCOUNTER — Encounter: Payer: Self-pay | Admitting: Vascular Surgery

## 2013-07-10 ENCOUNTER — Ambulatory Visit (INDEPENDENT_AMBULATORY_CARE_PROVIDER_SITE_OTHER): Payer: Self-pay | Admitting: Vascular Surgery

## 2013-07-10 ENCOUNTER — Encounter: Payer: Self-pay | Admitting: Vascular Surgery

## 2013-07-10 DIAGNOSIS — I6529 Occlusion and stenosis of unspecified carotid artery: Secondary | ICD-10-CM

## 2013-07-10 DIAGNOSIS — Z48812 Encounter for surgical aftercare following surgery on the circulatory system: Secondary | ICD-10-CM

## 2013-07-10 NOTE — Addendum Note (Signed)
Addended by: Adria Dill L on: 07/10/2013 12:47 PM   Modules accepted: Orders

## 2013-07-10 NOTE — Progress Notes (Signed)
Patient name: Ricky [name] MRN: [id number] DOB: 02-Apr-1931 Sex: male  REASON FOR VISIT: Follow up after left carotid endarterectomy  HPI: Ricky [name] is a 79 y.o. male good undergone previous right carotid endarterectomy. On a routine follow up study his left carotid stenosis had progressed to greater than 80% left carotid endarterectomy was recommended in order to lower his risk of future stroke. He underwent left carotid endarterectomy on 06/27/2013. He did well postoperatively and was discharged on postoperative day #1. He returns for his first outpatient visit. He denies any significant pain. He denies focal weakness or paresthesias. He has had no problems swallowing. He denies hoarseness. He does not take aspirin because he is on Coumadin.  REVIEW OF SYSTEMS: Arly.Keller ] denotes positive finding; [  ] denotes negative finding  CARDIOVASCULAR:  [ ]  chest pain   [ ]  dyspnea on exertion    CONSTITUTIONAL:  [ ]  fever   [ ]  chills  PHYSICAL EXAM: Filed Vitals:   07/10/13 0908 07/10/13 0910  BP: 158/67 155/67  Pulse: 69   Temp: 98 F (36.7 C)   TempSrc: Oral   Height: 6' (1.829 m)   Weight: 209 lb (94.802 kg)   SpO2: 100%    Body mass index is 28.34 kg/(m^2). GENERAL: The patient is a well-nourished male, in no acute distress. The vital signs are documented above. CARDIOVASCULAR: There is a regular rate and rhythm  PULMONARY: There is good air exchange bilaterally without wheezing or rales. His left neck incision is healing nicely. NEURO: He has no focal weakness or paresthesias.  MEDICAL ISSUES: Patient is doing well status post left carotid endarterectomy. I've ordered a fall carotid duplex scan in 6 months and will see him back at that time. If that looks good he'll just need yearly studies. He knows to call sooner if he has problems.  DICKSON,CHRISTOPHER S [facility]: [phone]

## 2013-07-19 ENCOUNTER — Ambulatory Visit (INDEPENDENT_AMBULATORY_CARE_PROVIDER_SITE_OTHER): Payer: Medicare Other | Admitting: Urology

## 2013-07-19 DIAGNOSIS — N3941 Urge incontinence: Secondary | ICD-10-CM

## 2013-07-19 DIAGNOSIS — N138 Other obstructive and reflux uropathy: Secondary | ICD-10-CM

## 2013-07-19 DIAGNOSIS — N476 Balanoposthitis: Secondary | ICD-10-CM

## 2013-07-19 DIAGNOSIS — N401 Enlarged prostate with lower urinary tract symptoms: Secondary | ICD-10-CM

## 2013-07-24 ENCOUNTER — Encounter: Payer: Self-pay | Admitting: Internal Medicine

## 2013-07-26 ENCOUNTER — Ambulatory Visit (INDEPENDENT_AMBULATORY_CARE_PROVIDER_SITE_OTHER): Payer: Medicare Other | Admitting: *Deleted

## 2013-07-26 DIAGNOSIS — I4891 Unspecified atrial fibrillation: Secondary | ICD-10-CM

## 2013-07-26 DIAGNOSIS — Z7901 Long term (current) use of anticoagulants: Secondary | ICD-10-CM

## 2013-07-26 DIAGNOSIS — I679 Cerebrovascular disease, unspecified: Secondary | ICD-10-CM

## 2013-08-01 ENCOUNTER — Other Ambulatory Visit: Payer: Self-pay | Admitting: Cardiology

## 2013-08-05 ENCOUNTER — Encounter: Payer: Self-pay | Admitting: Internal Medicine

## 2013-08-05 DIAGNOSIS — I4891 Unspecified atrial fibrillation: Secondary | ICD-10-CM

## 2013-08-08 ENCOUNTER — Telehealth: Payer: Self-pay | Admitting: Pulmonary Disease

## 2013-08-08 MED ORDER — CARISOPRODOL 250 MG PO TABS: 250.0000 mg | ORAL_TABLET | Freq: Three times a day (TID) | ORAL | Status: DC | PRN

## 2013-08-08 NOTE — Telephone Encounter (Signed)
Per SN---  Apply heating pad rx for soma 250 mg  1 po tid as needed for muscle spasms.  Called and spoke with pts wife and she is aware of med called to the pharmacy.  Nothing further is needed.

## 2013-08-08 NOTE — Telephone Encounter (Signed)
Pt c/o soreness on both sides of neck for past 2 days.  Sore to touch.  Denies sorethroat or diff swallowing.  Has had some sneezing and runny nose.  Please advise.

## 2013-08-09 ENCOUNTER — Ambulatory Visit (INDEPENDENT_AMBULATORY_CARE_PROVIDER_SITE_OTHER): Payer: Medicare Other | Admitting: Pharmacist

## 2013-08-09 DIAGNOSIS — Z7901 Long term (current) use of anticoagulants: Secondary | ICD-10-CM

## 2013-08-09 DIAGNOSIS — I4891 Unspecified atrial fibrillation: Secondary | ICD-10-CM

## 2013-08-09 DIAGNOSIS — I679 Cerebrovascular disease, unspecified: Secondary | ICD-10-CM

## 2013-08-15 ENCOUNTER — Ambulatory Visit: Payer: Medicare Other | Admitting: Internal Medicine

## 2013-08-15 ENCOUNTER — Encounter: Payer: Medicare Other | Admitting: Internal Medicine

## 2013-08-21 ENCOUNTER — Encounter: Payer: Self-pay | Admitting: Internal Medicine

## 2013-08-21 ENCOUNTER — Ambulatory Visit (INDEPENDENT_AMBULATORY_CARE_PROVIDER_SITE_OTHER): Payer: Medicare Other | Admitting: Internal Medicine

## 2013-08-21 ENCOUNTER — Ambulatory Visit (INDEPENDENT_AMBULATORY_CARE_PROVIDER_SITE_OTHER): Payer: Medicare Other | Admitting: *Deleted

## 2013-08-21 VITALS — BP 150/79 | HR 69 | Ht 72.0 in | Wt 203.6 lb

## 2013-08-21 DIAGNOSIS — I442 Atrioventricular block, complete: Secondary | ICD-10-CM

## 2013-08-21 DIAGNOSIS — I679 Cerebrovascular disease, unspecified: Secondary | ICD-10-CM

## 2013-08-21 DIAGNOSIS — Z950 Presence of cardiac pacemaker: Secondary | ICD-10-CM

## 2013-08-21 DIAGNOSIS — I2510 Atherosclerotic heart disease of native coronary artery without angina pectoris: Secondary | ICD-10-CM

## 2013-08-21 DIAGNOSIS — I4891 Unspecified atrial fibrillation: Secondary | ICD-10-CM

## 2013-08-21 DIAGNOSIS — Z7901 Long term (current) use of anticoagulants: Secondary | ICD-10-CM

## 2013-08-21 LAB — PACEMAKER DEVICE OBSERVATION
BAMS-0001: 180 {beats}/min
BRDY-0004RV: 120 {beats}/min
DEVICE MODEL PM: 1436753
RV LEAD THRESHOLD: 0.625 V

## 2013-08-21 LAB — POCT INR: INR: 3.5

## 2013-08-21 NOTE — Assessment & Plan Note (Signed)
Permanent. On anticoagulation 

## 2013-08-21 NOTE — Assessment & Plan Note (Signed)
The patient's device was interrogated.  The information was reviewed. No changes were made in the programming.    

## 2013-08-21 NOTE — Patient Instructions (Signed)
Your physician recommends that you continue on your current medications as directed. Please refer to the Current Medication list given to you today.  Your physician wants you to follow-up in: 6 months with Dr. Klein. You will receive a reminder letter in the mail two months in advance. If you don't receive a letter, please call our office to schedule the follow-up appointment.  

## 2013-08-21 NOTE — Progress Notes (Signed)
Patient Care Team: Michele Mcalpine, MD as PCP - General   HPI  Ricky [name] is a 79 y.o. male seen in followup for coronary artery disease. He status post CABG. He also has atrial arrhythmias and permanent atrial fibrillation. He is status post AV junction ablation and pacemaker implantation.  The patient denies chest pain, shortness of breath, nocturnal dyspnea, orthopnea or peripheral edema. There have been no palpitations, lightheadedness or syncope.  D[place]in  His daughter  died in 05-28-23. She had a stroke in the overdose on medication.      Past Medical History  Diagnosis Date  . Obstructive sleep apnea   . Ischemic cardiomyopathy   . Atrial fibrillation   . AV block, complete s/p ablation   . Pacemaker [name]   . Cerebrovascular disease   . Hypercholesterolemia   . Hiatal hernia   . Gastritis   . Diverticulosis   . Colonic polyp   . BPH (benign prostatic hypertrophy)   . DJD (degenerative joint disease)   . Memory loss   . Anxiety   . Anemia   . Shingles   . Carotid artery occlusion   . Hypertension   . Pneumonia     hx of  . Bruises easily     Past Surgical History  Procedure Laterality Date  . Pacemaker insertion    . Coronary artery bypass graft      x4 by Dr. Sena Hitch 9/00  . Carotid endarterectomy      right 10/04 Dr. Edilia Bo  . Anal fissure repair    . Hiatal hernia repair      incarcerated stomach with takedown and repair of hiatus and gastropexy  . Insert / replace / remove pacemaker    . Endarterectomy Left 06/27/2013    Procedure: Left Carotid Endarterectomy with Finesse patch angioplasty;  Surgeon: Chuck Hint, MD;  [facility] OR;  Service: Vascular;  Laterality: Left;    Current Outpatient Prescriptions  Medication Sig Dispense Refill  . acetaminophen (TYLENOL) 500 MG tablet Take 500 mg by mouth every 6 (six) hours as needed for pain.       Marland Kitchen ALPRAZolam (XANAX) 0.25 MG tablet Take 0.125-0.25 mg by mouth 3 (three) times  daily as needed for anxiety.       . carisoprodol (SOMA) 250 MG tablet Take 1 tablet (250 mg total) by mouth 3 (three) times daily as needed (for muscle spasms).  50 tablet  1  . clotrimazole-betamethasone (LOTRISONE) cream Apply 1 application topically 2 (two) times daily. Applies to legs      . dextromethorphan-guaiFENesin (MUCINEX DM) 30-600 MG per 12 hr tablet Take 1 tablet by mouth every 12 (twelve) hours.        . docusate sodium (COLACE) 100 MG capsule Take 1 capsule (100 mg total) by mouth every 12 (twelve) hours.  60 capsule  0  . donepezil (ARICEPT) 10 MG tablet Take 10 mg by mouth every morning.      . fesoterodine (TOVIAZ) 4 MG TB24 Take 4 mg by mouth daily.      . finasteride (PROSCAR) 5 MG tablet Take 5 mg by mouth daily.        Marland Kitchen latanoprost (XALATAN) 0.005 % ophthalmic solution Place 1 drop into both eyes daily.       Marland Kitchen lisinopril (PRINIVIL,ZESTRIL) 20 MG tablet Take 1 tablet (20 mg total) by mouth daily.  30 tablet  11  . Memantine HCl ER (NAMENDA XR) 28  MG CP24 Take 28 mg by mouth every morning.      . polyethylene glycol (MIRALAX / GLYCOLAX) packet Take 17 g by mouth daily. Take twice a day until having loose stools, then decrease to once a day  30 each  0  . QUEtiapine (SEROQUEL) 50 MG tablet Take 100 mg by mouth daily.      . simvastatin (ZOCOR) 40 MG tablet Take 40 mg by mouth at bedtime.      . traMADol (ULTRAM) 50 MG tablet Take 1 tablet (50 mg total) by mouth 3 (three) times daily as needed for pain.  90 tablet  5  . warfarin (COUMADIN) 5 MG tablet TAKE AS INSTRUCTED BY ANTICOAGULATION CLINIC  35 tablet  3   No current facility-administered medications for this visit.    No Known Allergies  Review of Systems negative except from HPI and PMH  Physical Exam BP 150/79  Pulse 69  Ht 6' (1.829 m)  Wt 203 lb 9.6 oz (92.352 kg)  BMI 27.61 kg/m2 Well developed and nourished in no acute distress HENT normal Neck supple with JVP-flat Clear Regular rate and rhythm, no  murmurs or gallops Abd-soft with active BS No Clubbing cyanosis edema Skin-warm and dry A & Oriented  Grossly normal sensory and motor function     Assessment and  Plan

## 2013-08-21 NOTE — Assessment & Plan Note (Signed)
Stable on current meds 

## 2013-08-21 NOTE — Assessment & Plan Note (Signed)
Stable post pacemaker 

## 2013-08-28 ENCOUNTER — Encounter: Payer: Self-pay | Admitting: Internal Medicine

## 2013-09-03 ENCOUNTER — Telehealth: Payer: Self-pay | Admitting: *Deleted

## 2013-09-03 MED ORDER — TIZANIDINE HCL 4 MG PO TABS: 4.0000 mg | ORAL_TABLET | Freq: Three times a day (TID) | ORAL | Status: DC | PRN

## 2013-09-03 NOTE — Telephone Encounter (Signed)
Received fax PA for soma 250 mg tab This is not covered on pt plan. Alternatives are meloxicam, dicolfenac, celebrex. PA # [id number] ID # [id number] Please advise SN thanks  No Known Allergies

## 2013-09-03 NOTE — Telephone Encounter (Signed)
Per SN - Tizanidine 4mg  1 po tid prn  Rx will be sent in.

## 2013-09-05 ENCOUNTER — Telehealth: Payer: Self-pay | Admitting: Pulmonary Disease

## 2013-09-05 ENCOUNTER — Other Ambulatory Visit: Payer: Self-pay | Admitting: Neurology

## 2013-09-05 ENCOUNTER — Ambulatory Visit (INDEPENDENT_AMBULATORY_CARE_PROVIDER_SITE_OTHER): Payer: Medicare Other | Admitting: Pharmacist

## 2013-09-05 DIAGNOSIS — I679 Cerebrovascular disease, unspecified: Secondary | ICD-10-CM

## 2013-09-05 DIAGNOSIS — I4891 Unspecified atrial fibrillation: Secondary | ICD-10-CM

## 2013-09-05 DIAGNOSIS — Z7901 Long term (current) use of anticoagulants: Secondary | ICD-10-CM

## 2013-09-05 NOTE — Telephone Encounter (Signed)
Called and spoke with pt and she stated that she wanted to ask about his alprazolam.    She has been giving him 1/2 tab in the morning and 1 tablet at bedtime.  She wanted to make sure that it was ok to increase the morning dose to 1 tablet.  i advised her that this would be ok---he is able to take 1/2 to 1 tablet by mouth three times daily.  She stated that she will increase the dose in the morning and watch him to make sure that he is stable with getting up and walking around.  She will keep a check on him after increasing the dose.    pts wife stated that the pt seems to be getting worse and she is going to look into getting help into the home for them both.  She will call back if anything further is needed.

## 2013-09-16 ENCOUNTER — Encounter: Payer: Self-pay | Admitting: Pulmonary Disease

## 2013-09-16 ENCOUNTER — Other Ambulatory Visit (INDEPENDENT_AMBULATORY_CARE_PROVIDER_SITE_OTHER): Payer: Medicare Other

## 2013-09-16 ENCOUNTER — Ambulatory Visit (INDEPENDENT_AMBULATORY_CARE_PROVIDER_SITE_OTHER): Payer: Medicare Other | Admitting: Pulmonary Disease

## 2013-09-16 VITALS — BP 134/76 | HR 77 | Temp 98.5°F | Ht 69.0 in | Wt 208.0 lb

## 2013-09-16 DIAGNOSIS — I10 Essential (primary) hypertension: Secondary | ICD-10-CM

## 2013-09-16 DIAGNOSIS — I4891 Unspecified atrial fibrillation: Secondary | ICD-10-CM

## 2013-09-16 DIAGNOSIS — K2990 Gastroduodenitis, unspecified, without bleeding: Secondary | ICD-10-CM

## 2013-09-16 DIAGNOSIS — F02818 Dementia in other diseases classified elsewhere, unspecified severity, with other behavioral disturbance: Secondary | ICD-10-CM

## 2013-09-16 DIAGNOSIS — R7309 Other abnormal glucose: Secondary | ICD-10-CM

## 2013-09-16 DIAGNOSIS — F411 Generalized anxiety disorder: Secondary | ICD-10-CM

## 2013-09-16 DIAGNOSIS — I442 Atrioventricular block, complete: Secondary | ICD-10-CM

## 2013-09-16 DIAGNOSIS — N138 Other obstructive and reflux uropathy: Secondary | ICD-10-CM

## 2013-09-16 DIAGNOSIS — E7800 Pure hypercholesterolemia, unspecified: Secondary | ICD-10-CM

## 2013-09-16 DIAGNOSIS — I2510 Atherosclerotic heart disease of native coronary artery without angina pectoris: Secondary | ICD-10-CM

## 2013-09-16 DIAGNOSIS — K2970 Gastritis, unspecified, without bleeding: Secondary | ICD-10-CM

## 2013-09-16 DIAGNOSIS — Z23 Encounter for immunization: Secondary | ICD-10-CM

## 2013-09-16 DIAGNOSIS — M1990 Unspecified osteoarthritis, unspecified site: Secondary | ICD-10-CM

## 2013-09-16 DIAGNOSIS — N401 Enlarged prostate with lower urinary tract symptoms: Secondary | ICD-10-CM

## 2013-09-16 DIAGNOSIS — I679 Cerebrovascular disease, unspecified: Secondary | ICD-10-CM

## 2013-09-16 DIAGNOSIS — F0281 Dementia in other diseases classified elsewhere with behavioral disturbance: Secondary | ICD-10-CM

## 2013-09-16 DIAGNOSIS — K5730 Diverticulosis of large intestine without perforation or abscess without bleeding: Secondary | ICD-10-CM

## 2013-09-16 DIAGNOSIS — Z950 Presence of cardiac pacemaker: Secondary | ICD-10-CM

## 2013-09-16 LAB — BASIC METABOLIC PANEL
Calcium: 9.7 mg/dL (ref 8.4–10.5)
GFR: 65.71 mL/min (ref >60.00)
Sodium: 139 mEq/L (ref 135–145)

## 2013-09-16 LAB — LIPID PANEL
HDL: 44.9 mg/dL (ref >39.00)
Total CHOL/HDL Ratio: 4
VLDL: 30.8 mg/dL (ref 0.0–40.0)

## 2013-09-16 LAB — HEMOGLOBIN A1C: Hgb A1c MFr Bld: 6.5 % (ref 4.6–6.5)

## 2013-09-16 LAB — TSH: TSH: 2.88 u[IU]/mL (ref 0.35–5.50)

## 2013-09-16 MED ORDER — MEMANTINE HCL 10 MG PO TABS: ORAL_TABLET | ORAL | Status: DC

## 2013-09-16 MED ORDER — ALPRAZOLAM 0.25 MG PO TABS: ORAL_TABLET | ORAL | Status: DC

## 2013-09-16 MED ORDER — DONEPEZIL HCL 10 MG PO TABS: 10.0000 mg | ORAL_TABLET | Freq: Every morning | ORAL | Status: DC
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
## 2013-09-16 NOTE — Patient Instructions (Signed)
Today we updated your med list in our EPIC system...    Continue your current medications the same...  We refilled the meds you requested...  Today we did your follow up FASTING blood work...    We will contact you w/ the results when available...   Call for any questions...  Let's plan a follow up visit in 6mo, sooner if needed for problems...     

## 2013-09-18 ENCOUNTER — Ambulatory Visit (INDEPENDENT_AMBULATORY_CARE_PROVIDER_SITE_OTHER): Payer: Medicare Other | Admitting: Neurology

## 2013-09-18 ENCOUNTER — Encounter: Payer: Self-pay | Admitting: Neurology

## 2013-09-18 ENCOUNTER — Ambulatory Visit: Payer: Medicare Other | Admitting: Neurology

## 2013-09-18 VITALS — BP 162/72 | HR 70 | Resp 15 | Ht 71.0 in | Wt 210.0 lb

## 2013-09-18 DIAGNOSIS — R4589 Other symptoms and signs involving emotional state: Secondary | ICD-10-CM

## 2013-09-18 DIAGNOSIS — F0280 Dementia in other diseases classified elsewhere without behavioral disturbance: Secondary | ICD-10-CM

## 2013-09-18 DIAGNOSIS — R451 Restlessness and agitation: Secondary | ICD-10-CM

## 2013-09-18 DIAGNOSIS — F911 Conduct disorder, childhood-onset type: Secondary | ICD-10-CM

## 2013-09-18 DIAGNOSIS — Z9181 History of falling: Secondary | ICD-10-CM

## 2013-09-18 DIAGNOSIS — R259 Unspecified abnormal involuntary movements: Secondary | ICD-10-CM

## 2013-09-18 NOTE — Patient Instructions (Signed)

## 2013-09-18 NOTE — Progress Notes (Signed)
Quick Note:  Pt aware of results. No questions or concerns at this time. ______ 

## 2013-09-18 NOTE — Progress Notes (Deleted)
Chief Complaint  Patient presents with  . Dementia    6 MO F/U

## 2013-09-18 NOTE — Progress Notes (Signed)
[facility]  Provider:  Melvyn Novas, M D  Referring Provider: Michele Mcalpine, MD Primary Care Physician:  Michele Mcalpine, MD  Chief Complaint  Patient presents with  . Dementia    6 MO F/U    HPI:  Ricky [name] is a 79 y.o. male  Is seen here as a revisit  from Dr. Kriste Basque for dementia.  Patient has last been seen by Darrol Angel [facility],  on 03-13-13.  The patient remains independent in dressing is feeding himself his back leaving himself. On Seroquel he is sleeping better and has less agitation. He has been" ugly" to his wife, he has been very vocal and disinhibited. He is easily agitated, aggressive and impulsive.  This geriatric depression score today was endorsed at 4 points, a [place] cognitive assessment test was performed. The patient scored on this memory test 16 points out of 30 possible points. The patient and his wife report no hallucination, neither visual nor auditory. He has no REM BD, sleeps deep and long. He has no trouble to go to the [place], has not wandered around the house.   His  appetite is stable and so is his sleep.  He is known to have untreated OSA, but his wife is not longer able to convince him to take medication, therapy etc. She seems afraid of her husband.  She is tearful, and the loss of their daughter has further depressed the two.       Review of Systems: Out of a complete 14 system review, the patient complains of only the following symptoms, and all other reviewed systems are negative.  " hateful, ugly". Disinhibited. "  The patients daughter died on 64- 45-14 .  History   Social History  . Marital Status: Married    Spouse Name: arrie    Number of Children: 1  . Years of Education: N/A   Occupational History  . retired   .     Social History Main Topics  . Smoking status: Former Smoker -- 1.50 packs/day for 50 years    Types: Cigarettes    Quit date: 11/08/1995  . Smokeless tobacco: Never Used  . Alcohol Use: No      Comment: quit drinking back in Mar 23, 1976  . Drug Use: No  . Sexual Activity: Not Currently   Other Topics Concern  . Not on file   Social History Narrative   Married, wife Milinda Pointer, 48yrs   1 daughter from 1st marriage who lives nearby   Son died 03-23-2010 in tractor accident.    Family History  Problem Relation Age of Onset  . Coronary artery disease Other     Past Medical History  Diagnosis Date  . Obstructive sleep apnea   . Ischemic cardiomyopathy   . Atrial fibrillation   . AV block, complete s/p ablation   . Pacemaker [place]   . Cerebrovascular disease   . Hypercholesterolemia   . Hiatal hernia   . Gastritis   . Diverticulosis   . Colonic polyp   . BPH (benign prostatic hypertrophy)   . DJD (degenerative joint disease)   . Memory loss   . Anxiety   . Anemia   . Shingles   . Carotid artery occlusion   . Hypertension   . Pneumonia     hx of  . Bruises easily     Past Surgical History  Procedure Laterality Date  . Pacemaker insertion    . Coronary artery bypass graft  x4 by Dr. Sena Hitch 9/00  . Carotid endarterectomy      right 10/04 Dr. Edilia Bo  . Anal fissure repair    . Hiatal hernia repair      incarcerated stomach with takedown and repair of hiatus and gastropexy  . Insert / replace / remove pacemaker    . Endarterectomy Left 06/27/2013    Procedure: Left Carotid Endarterectomy with Finesse patch angioplasty;  Surgeon: Chuck Hint, MD;  Location: [facility] OR;  Service: Vascular;  Laterality: Left;    Current Outpatient Prescriptions  Medication Sig Dispense Refill  . ALPRAZolam (XANAX) 0.25 MG tablet Take 1/2  To 1 tablet  By mouth three times daily as needed for anxiety  90 tablet  5  . clotrimazole-betamethasone (LOTRISONE) cream Apply 1 application topically 2 (two) times daily. Applies to legs      . docusate sodium (COLACE) 100 MG capsule Take 1 capsule (100 mg total) by mouth every 12 (twelve) hours.  60 capsule  0  . donepezil  (ARICEPT) 10 MG tablet Take 1 tablet (10 mg total) by mouth every morning.  30 tablet  6  . fesoterodine (TOVIAZ) 4 MG TB24 Take 4 mg by mouth daily.      . finasteride (PROSCAR) 5 MG tablet Take 5 mg by mouth daily.        Marland Kitchen latanoprost (XALATAN) 0.005 % ophthalmic solution Place 1 drop into both eyes daily.       Marland Kitchen lisinopril (PRINIVIL,ZESTRIL) 20 MG tablet Take 1 tablet (20 mg total) by mouth daily.  30 tablet  11  . memantine (NAMENDA) 10 MG tablet TAKE ONE TABLET BY MOUTH TWICE DAILY  60 tablet  6  . nystatin-triamcinolone (MYCOLOG II) cream       . polyethylene glycol (MIRALAX / GLYCOLAX) packet Take 17 g by mouth daily. Take twice a day until having loose stools, then decrease to once a day  30 each  0  . QUEtiapine (SEROQUEL) 50 MG tablet Take 100 mg by mouth daily.      . simvastatin (ZOCOR) 40 MG tablet Take 40 mg by mouth at bedtime.      Marland Kitchen warfarin (COUMADIN) 5 MG tablet TAKE AS INSTRUCTED BY ANTICOAGULATION CLINIC  35 tablet  3  . acetaminophen (TYLENOL) 500 MG tablet Take 500 mg by mouth every 6 (six) hours as needed for pain.       . carisoprodol (SOMA) 250 MG tablet Take 1 tablet (250 mg total) by mouth 3 (three) times daily as needed (for muscle spasms).  50 tablet  1  . dextromethorphan-guaiFENesin (MUCINEX DM) 30-600 MG per 12 hr tablet Take 1 tablet by mouth every 12 (twelve) hours.        Marland Kitchen tiZANidine (ZANAFLEX) 4 MG tablet Take 1 tablet (4 mg total) by mouth 3 (three) times daily as needed.  50 tablet  1  . traMADol (ULTRAM) 50 MG tablet Take 1 tablet (50 mg total) by mouth 3 (three) times daily as needed for pain.  90 tablet  5   No current facility-administered medications for this visit.    Allergies as of 09/18/2013  . (No Known Allergies)    Vitals: BP 162/72  Pulse 70  Resp 15  Ht 5\' 11"  (1.803 m)  Wt 210 lb (95.255 kg)  BMI 29.30 kg/m2 Last Weight:  Wt Readings from Last 1 Encounters:  09/18/13 210 lb (95.255 kg)   Last Height:   Ht Readings from Last  1 Encounters:  09/18/13  5\' 11"  (1.803 m)    Physical exam:  General: The patient is awake, alert and appears not in acute distress. The patient is well groomed. Head: Normocephalic, atraumatic. Neck is supple. Cardiovascular:  Regular rate and rhythm without  murmurs or carotid bruit, and without distended neck veins. Respiratory: Lungs are clear to auscultation. Skin:  Without evidence of edema,  Facial erythema.  Indentation over the left temple.  Trunk: BMI is elevated .  Neurologic exam : The patient is awake and alert, oriented to place and time.   Memory subjective further impaired - MOCA  15-30 , he needs to be evaluated by [facility] for continuity.   There is agitation, Speech is fluent.  Cranial nerves: Pupils are equal and briskly reactive to light. Funduscopic exam - patient has cataracts and Glaucoma.   Extraocular movements  in vertical and horizontal planes intact and without nystagmus. Hearing to finger rub intact.  Facial sensation intact to fine touch. Facial motor strength is symmetric and tongue and uvula move midline.  His tongue is tremulous.    Motor exam:   This  Patient has generalized rigidity, cog wheeling and stiffness of movements.  Sensory:  Fine touch, pinprick and vibration were tested in all extremities.  He felt vibration in his ankles , not below.  Proprioception is tested in the upper extremities only. This was  normal.  Coordination: Rapid alternating movements in the fingers/hands is tested and normal.  Finger-to-nose maneuver tested and normal without evidence of ataxia, dysmetria but with action  tremor.  Gait and station: Patient walks without assistive device and is able and rose from a seated position unassisted .  Shuffling Gait.  Deep tendon reflexes: in the  upper and lower extremities are brisk and intact.  Babinski maneuver response is downgoing.    Assessment:  After physical and neurologic examination, review of laboratory studies,  imaging, neurophysiology testing and pre-existing records, assessment is   Dementia with frontal lobe dominance, according to the unreflective behaviors and verbal abuse his wife is exposed to. He has become more rigid on Seroquel,   Has some tremor,  Loss of arm swing. His gait is slow , but not shuffling.  I am concerned about subtypes of dementia such a parkinsonian or Lewy body.   Plan:  Treatment plan and additional workup : Continue with seroquel and namenda.  EEG was slow in 2012. PSG testing.  MRI or CT not on record. Will need CT non contrast .

## 2013-09-25 ENCOUNTER — Ambulatory Visit
Admission: RE | Admit: 2013-09-25 | Discharge: 2013-09-25 | Disposition: A | Discharge status: Home / Self Care | Payer: Medicare Other | Source: Ambulatory Visit | Attending: Neurology | Admitting: Neurology

## 2013-09-25 DIAGNOSIS — F0390 Unspecified dementia without behavioral disturbance: Secondary | ICD-10-CM

## 2013-09-25 DIAGNOSIS — F911 Conduct disorder, childhood-onset type: Secondary | ICD-10-CM

## 2013-09-25 DIAGNOSIS — Z9181 History of falling: Secondary | ICD-10-CM

## 2013-09-25 DIAGNOSIS — F0280 Dementia in other diseases classified elsewhere without behavioral disturbance: Secondary | ICD-10-CM

## 2013-09-25 DIAGNOSIS — R451 Restlessness and agitation: Secondary | ICD-10-CM

## 2013-09-25 DIAGNOSIS — R259 Unspecified abnormal involuntary movements: Secondary | ICD-10-CM

## 2013-09-26 ENCOUNTER — Ambulatory Visit (INDEPENDENT_AMBULATORY_CARE_PROVIDER_SITE_OTHER): Payer: Medicare Other | Admitting: *Deleted

## 2013-09-26 DIAGNOSIS — Z7901 Long term (current) use of anticoagulants: Secondary | ICD-10-CM

## 2013-09-26 DIAGNOSIS — I4891 Unspecified atrial fibrillation: Secondary | ICD-10-CM

## 2013-09-26 DIAGNOSIS — I679 Cerebrovascular disease, unspecified: Secondary | ICD-10-CM

## 2013-09-26 LAB — POCT INR: INR: 3.4

## 2013-10-02 ENCOUNTER — Ambulatory Visit: Payer: Medicare Other | Attending: Neurology | Admitting: Physical Therapy

## 2013-10-02 DIAGNOSIS — IMO0001 Reserved for inherently not codable concepts without codable children: Secondary | ICD-10-CM | POA: Insufficient documentation

## 2013-10-02 DIAGNOSIS — R269 Unspecified abnormalities of gait and mobility: Secondary | ICD-10-CM | POA: Insufficient documentation

## 2013-10-04 ENCOUNTER — Other Ambulatory Visit: Payer: Self-pay | Admitting: Neurology

## 2013-10-07 ENCOUNTER — Telehealth: Payer: Self-pay | Admitting: Neurology

## 2013-10-08 ENCOUNTER — Ambulatory Visit: Payer: Medicare Other | Attending: Neurology | Admitting: Physical Therapy

## 2013-10-08 DIAGNOSIS — G309 Alzheimer's disease, unspecified: Secondary | ICD-10-CM | POA: Insufficient documentation

## 2013-10-08 DIAGNOSIS — IMO0001 Reserved for inherently not codable concepts without codable children: Secondary | ICD-10-CM | POA: Insufficient documentation

## 2013-10-08 DIAGNOSIS — R269 Unspecified abnormalities of gait and mobility: Secondary | ICD-10-CM | POA: Insufficient documentation

## 2013-10-08 DIAGNOSIS — F0280 Dementia in other diseases classified elsewhere without behavioral disturbance: Secondary | ICD-10-CM | POA: Insufficient documentation

## 2013-10-09 ENCOUNTER — Encounter: Payer: Self-pay | Admitting: Neurology

## 2013-10-10 ENCOUNTER — Ambulatory Visit: Payer: Medicare Other | Admitting: Physical Therapy

## 2013-10-10 NOTE — Telephone Encounter (Signed)
Spoke to husband and wife and relayed CT brain results, per Dr. Vickey Huger.  Wife expressed understanding.

## 2013-10-14 ENCOUNTER — Ambulatory Visit: Payer: Medicare Other | Admitting: Physical Therapy

## 2013-10-14 ENCOUNTER — Ambulatory Visit (INDEPENDENT_AMBULATORY_CARE_PROVIDER_SITE_OTHER): Payer: Medicare Other | Admitting: *Deleted

## 2013-10-14 DIAGNOSIS — I4891 Unspecified atrial fibrillation: Secondary | ICD-10-CM

## 2013-10-14 DIAGNOSIS — Z7901 Long term (current) use of anticoagulants: Secondary | ICD-10-CM

## 2013-10-14 DIAGNOSIS — I679 Cerebrovascular disease, unspecified: Secondary | ICD-10-CM

## 2013-10-14 LAB — POCT INR: INR: 2.3

## 2013-10-15 ENCOUNTER — Ambulatory Visit: Payer: Medicare Other | Admitting: Physical Therapy

## 2013-10-17 ENCOUNTER — Ambulatory Visit: Payer: Medicare Other | Admitting: Physical Therapy

## 2013-10-18 ENCOUNTER — Ambulatory Visit (INDEPENDENT_AMBULATORY_CARE_PROVIDER_SITE_OTHER): Payer: Medicare Other | Admitting: Urology

## 2013-10-18 ENCOUNTER — Ambulatory Visit: Payer: Medicare Other | Admitting: Physical Therapy

## 2013-10-18 DIAGNOSIS — N3941 Urge incontinence: Secondary | ICD-10-CM

## 2013-10-18 DIAGNOSIS — N476 Balanoposthitis: Secondary | ICD-10-CM

## 2013-10-22 ENCOUNTER — Ambulatory Visit: Payer: Medicare Other | Admitting: Physical Therapy

## 2013-10-23 ENCOUNTER — Ambulatory Visit: Payer: Medicare Other | Attending: Neurology | Admitting: Physical Therapy

## 2013-10-23 DIAGNOSIS — IMO0001 Reserved for inherently not codable concepts without codable children: Secondary | ICD-10-CM | POA: Insufficient documentation

## 2013-10-23 DIAGNOSIS — G309 Alzheimer's disease, unspecified: Secondary | ICD-10-CM | POA: Insufficient documentation

## 2013-10-23 DIAGNOSIS — R269 Unspecified abnormalities of gait and mobility: Secondary | ICD-10-CM | POA: Insufficient documentation

## 2013-10-23 DIAGNOSIS — F0280 Dementia in other diseases classified elsewhere without behavioral disturbance: Secondary | ICD-10-CM | POA: Insufficient documentation

## 2013-10-24 ENCOUNTER — Ambulatory Visit: Payer: Medicare Other | Admitting: Physical Therapy

## 2013-10-25 ENCOUNTER — Ambulatory Visit: Payer: Medicare Other | Admitting: Physical Therapy

## 2013-10-28 ENCOUNTER — Ambulatory Visit: Payer: Medicare Other | Admitting: Physical Therapy

## 2013-10-28 ENCOUNTER — Encounter: Payer: Medicare Other | Admitting: Physical Therapy

## 2013-10-29 ENCOUNTER — Encounter: Payer: Medicare Other | Admitting: Physical Therapy

## 2013-10-29 ENCOUNTER — Ambulatory Visit: Payer: Medicare Other | Admitting: Physical Therapy

## 2013-11-04 ENCOUNTER — Other Ambulatory Visit: Payer: Self-pay | Admitting: Nurse Practitioner

## 2013-11-04 ENCOUNTER — Telehealth: Payer: Self-pay | Admitting: Pulmonary Disease

## 2013-11-04 ENCOUNTER — Encounter: Payer: Self-pay | Admitting: Internal Medicine

## 2013-11-04 DIAGNOSIS — I4891 Unspecified atrial fibrillation: Secondary | ICD-10-CM

## 2013-11-04 NOTE — Telephone Encounter (Signed)
I spoke with the pt spouse and she states the pt has a blister on his elbow. She states it is the size of a golf ball. She states that there are no other blisters anywhere. The blister is not painful. I advised that we do not have any appts today so I recommended that they go to urgent care to have this looked at. Nothing further needed. Carron Curie, CMA

## 2013-11-14 ENCOUNTER — Ambulatory Visit (INDEPENDENT_AMBULATORY_CARE_PROVIDER_SITE_OTHER): Payer: Medicare Other | Admitting: *Deleted

## 2013-11-14 DIAGNOSIS — I679 Cerebrovascular disease, unspecified: Secondary | ICD-10-CM

## 2013-11-14 DIAGNOSIS — Z7901 Long term (current) use of anticoagulants: Secondary | ICD-10-CM

## 2013-11-14 DIAGNOSIS — I4891 Unspecified atrial fibrillation: Secondary | ICD-10-CM

## 2013-11-14 LAB — POCT INR: INR: 2.5

## 2013-11-22 ENCOUNTER — Ambulatory Visit (INDEPENDENT_AMBULATORY_CARE_PROVIDER_SITE_OTHER): Payer: Medicare Other | Admitting: Urology

## 2013-11-22 DIAGNOSIS — N3941 Urge incontinence: Secondary | ICD-10-CM

## 2013-11-22 DIAGNOSIS — N476 Balanoposthitis: Secondary | ICD-10-CM

## 2013-11-27 IMAGING — CR DG ABDOMEN 2V
2 series · 2 of 2 positions shown · non-contrast
Comparison: 02/08/2008

CLINICAL DATA: Constipation, abdominal discomfort

ABDOMEN - 2 VIEW

[w abdomen upright *]
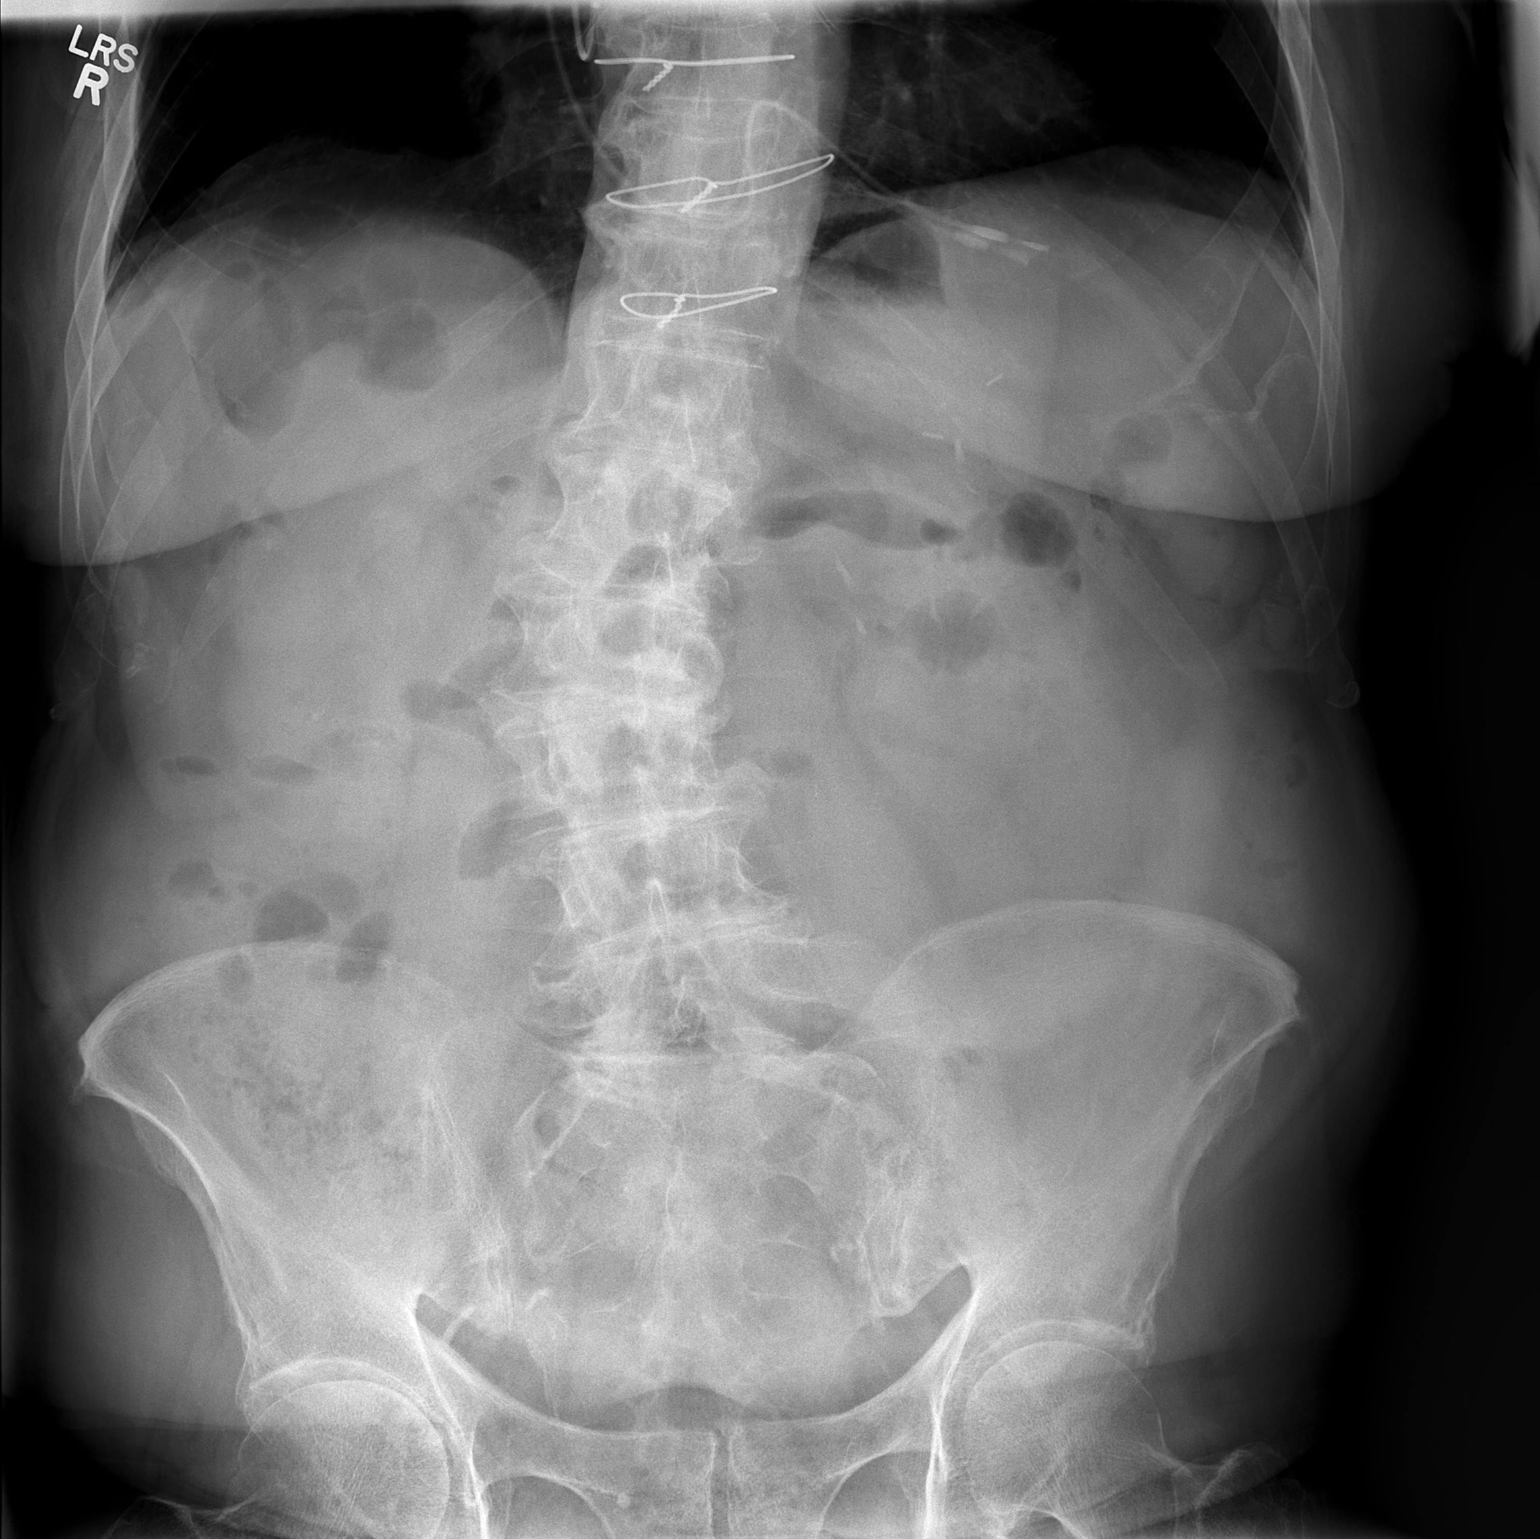

[t abdomen supine]
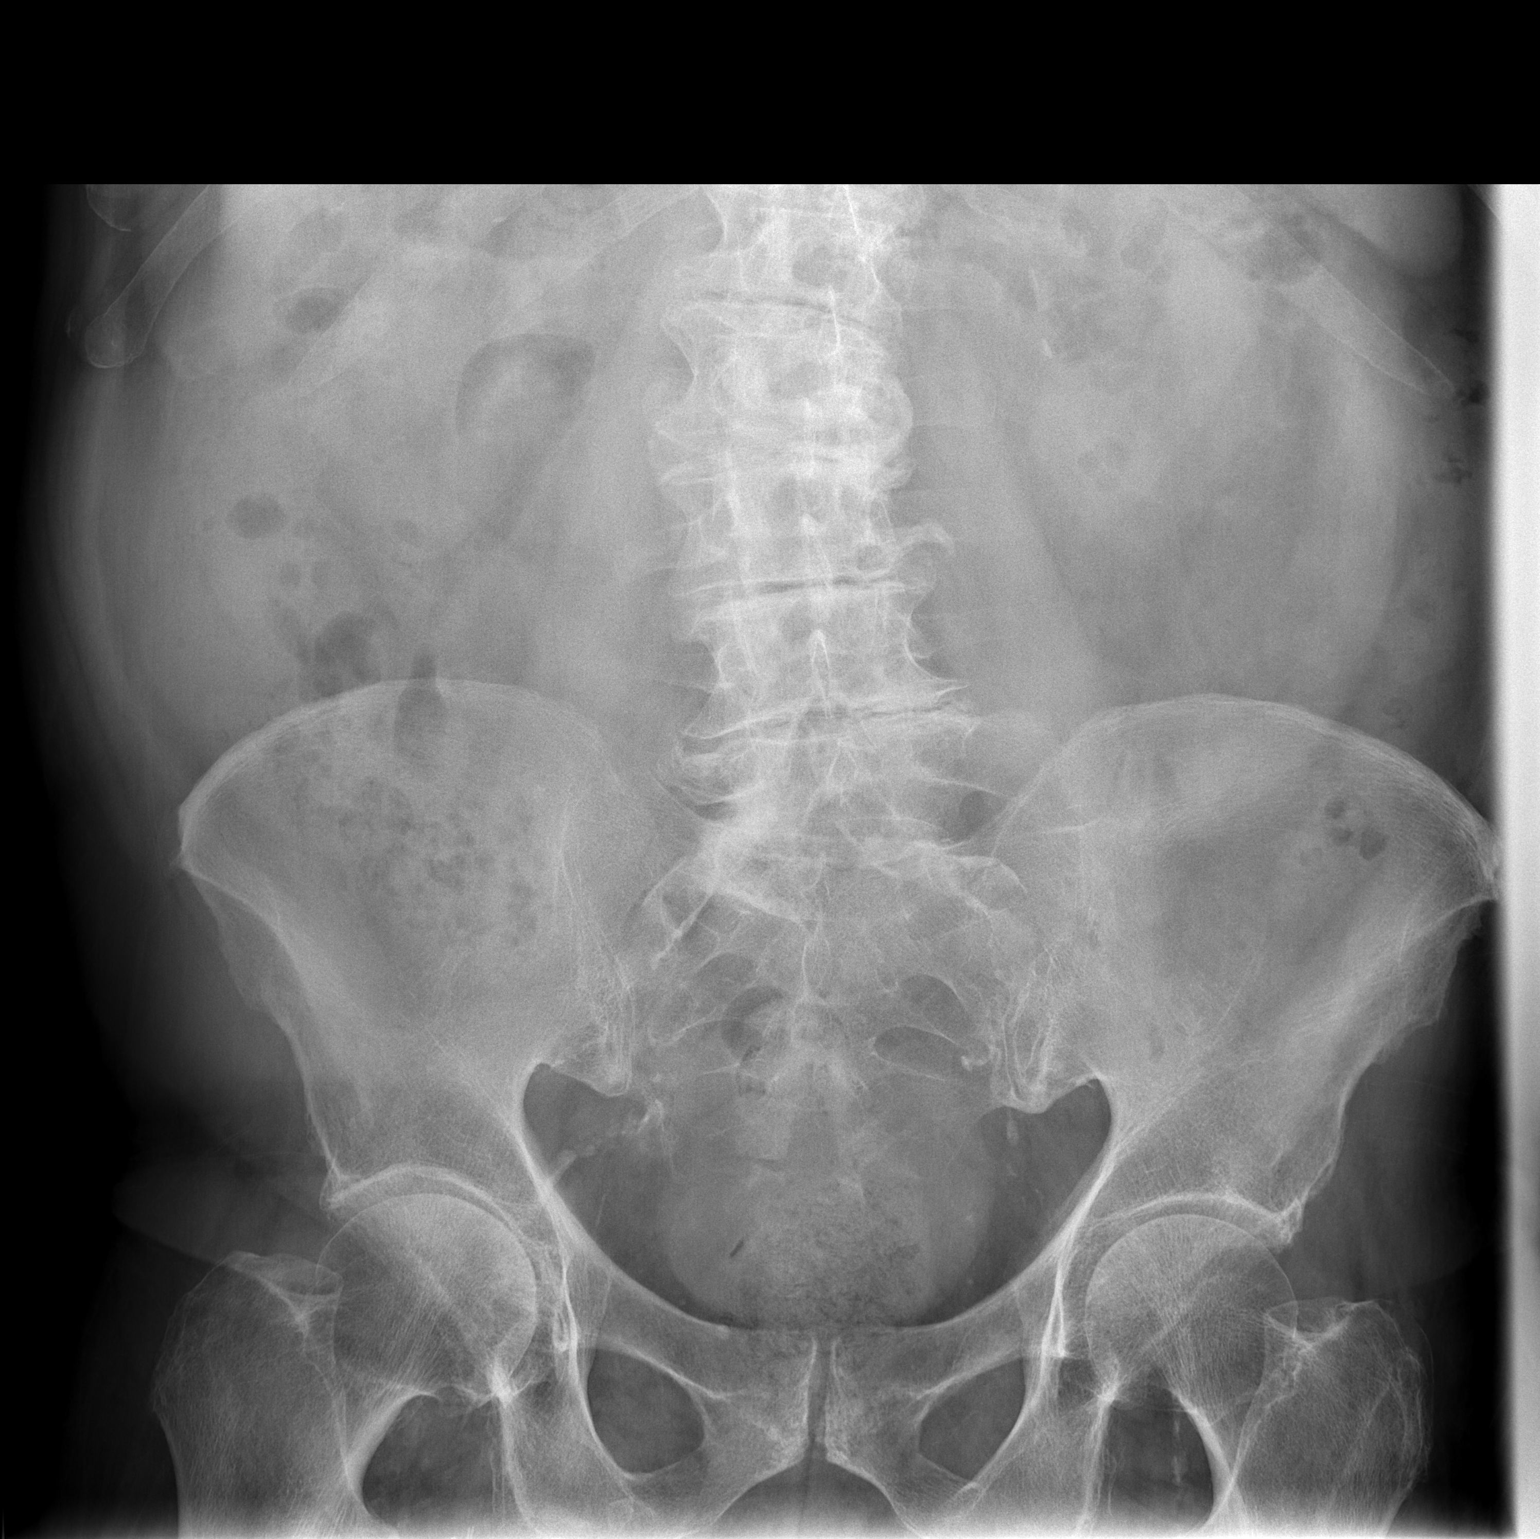

[2 of 2 positions shown; findings below may reference images not displayed]

FINDINGS: Nonobstructive bowel gas pattern.  Surgical clips project
over the left upper quadrant.  Visualized portions of the lung
bases are predominately clear.  Partially imaged pacing wire
projecting over the right ventricle.  Median sternotomy wires.
Atherosclerotic vascular calcifications.  Osteopenia and multilevel
degenerative change.  Rightward curvature of the lumbar spine.
IMPRESSION: Nonobstructive bowel gas pattern.

## 2013-12-05 ENCOUNTER — Other Ambulatory Visit: Payer: Self-pay | Admitting: Vascular Surgery

## 2013-12-05 DIAGNOSIS — Z48812 Encounter for surgical aftercare following surgery on the circulatory system: Secondary | ICD-10-CM

## 2013-12-05 DIAGNOSIS — I6529 Occlusion and stenosis of unspecified carotid artery: Secondary | ICD-10-CM

## 2013-12-16 ENCOUNTER — Other Ambulatory Visit: Payer: Self-pay | Admitting: Internal Medicine

## 2013-12-16 ENCOUNTER — Other Ambulatory Visit: Payer: Self-pay | Admitting: Neurology

## 2013-12-19 ENCOUNTER — Emergency Department (HOSPITAL_COMMUNITY)
Admission: EM | Admit: 2013-12-19 | Discharge: 2013-12-19 | Disposition: A | Discharge status: Home / Self Care | Payer: Medicare Other | Attending: Emergency Medicine | Admitting: Emergency Medicine

## 2013-12-19 ENCOUNTER — Emergency Department (HOSPITAL_COMMUNITY): Payer: Medicare Other

## 2013-12-19 ENCOUNTER — Encounter (HOSPITAL_COMMUNITY): Payer: Self-pay | Admitting: Emergency Medicine

## 2013-12-19 DIAGNOSIS — Z8701 Personal history of pneumonia (recurrent): Secondary | ICD-10-CM | POA: Insufficient documentation

## 2013-12-19 DIAGNOSIS — M6250 Muscle wasting and atrophy, not elsewhere classified, unspecified site: Secondary | ICD-10-CM | POA: Insufficient documentation

## 2013-12-19 DIAGNOSIS — Z950 Presence of cardiac pacemaker: Secondary | ICD-10-CM | POA: Insufficient documentation

## 2013-12-19 DIAGNOSIS — N390 Urinary tract infection, site not specified: Secondary | ICD-10-CM

## 2013-12-19 DIAGNOSIS — Z862 Personal history of diseases of the blood and blood-forming organs and certain disorders involving the immune mechanism: Secondary | ICD-10-CM | POA: Insufficient documentation

## 2013-12-19 DIAGNOSIS — Z8719 Personal history of other diseases of the digestive system: Secondary | ICD-10-CM | POA: Insufficient documentation

## 2013-12-19 DIAGNOSIS — Z951 Presence of aortocoronary bypass graft: Secondary | ICD-10-CM | POA: Insufficient documentation

## 2013-12-19 DIAGNOSIS — Z860100 Personal history of colon polyps, unspecified: Secondary | ICD-10-CM | POA: Insufficient documentation

## 2013-12-19 DIAGNOSIS — I2510 Atherosclerotic heart disease of native coronary artery without angina pectoris: Secondary | ICD-10-CM | POA: Insufficient documentation

## 2013-12-19 DIAGNOSIS — IMO0002 Reserved for concepts with insufficient information to code with codable children: Secondary | ICD-10-CM | POA: Insufficient documentation

## 2013-12-19 DIAGNOSIS — F411 Generalized anxiety disorder: Secondary | ICD-10-CM | POA: Insufficient documentation

## 2013-12-19 DIAGNOSIS — Z7901 Long term (current) use of anticoagulants: Secondary | ICD-10-CM | POA: Insufficient documentation

## 2013-12-19 DIAGNOSIS — Z8619 Personal history of other infectious and parasitic diseases: Secondary | ICD-10-CM | POA: Insufficient documentation

## 2013-12-19 DIAGNOSIS — E7800 Pure hypercholesterolemia, unspecified: Secondary | ICD-10-CM | POA: Insufficient documentation

## 2013-12-19 DIAGNOSIS — Z79899 Other long term (current) drug therapy: Secondary | ICD-10-CM | POA: Insufficient documentation

## 2013-12-19 DIAGNOSIS — I4891 Unspecified atrial fibrillation: Secondary | ICD-10-CM | POA: Insufficient documentation

## 2013-12-19 DIAGNOSIS — I10 Essential (primary) hypertension: Secondary | ICD-10-CM | POA: Insufficient documentation

## 2013-12-19 DIAGNOSIS — Z87891 Personal history of nicotine dependence: Secondary | ICD-10-CM | POA: Insufficient documentation

## 2013-12-19 DIAGNOSIS — R5381 Other malaise: Secondary | ICD-10-CM

## 2013-12-19 LAB — BASIC METABOLIC PANEL
BUN: 20 mg/dL (ref 6–23)
CO2: 27 mEq/L (ref 19–32)
Calcium: 9.9 mg/dL (ref 8.4–10.5)
Chloride: 101 mEq/L (ref 96–112)
Creatinine, Ser: 1.19 mg/dL (ref 0.50–1.35)
GFR calc Af Amer: 65 mL/min — ABNORMAL LOW (ref >90)
GFR calc non Af Amer: 56 mL/min — ABNORMAL LOW (ref >90)
Glucose, Bld: 118 mg/dL — ABNORMAL HIGH (ref 70–99)
Potassium: 4.2 mEq/L (ref 3.7–5.3)
Sodium: 141 mEq/L (ref 137–147)

## 2013-12-19 LAB — URINE MICROSCOPIC-ADD ON

## 2013-12-19 LAB — CBC
HCT: 39.9 % (ref 39.0–52.0)
Hemoglobin: 13.7 g/dL (ref 13.0–17.0)
MCH: 32.5 pg (ref 26.0–34.0)
MCHC: 34.3 g/dL (ref 30.0–36.0)
MCV: 94.8 fL (ref 78.0–100.0)
Platelets: 160 10*3/uL (ref 150–400)
RBC: 4.21 MIL/uL — ABNORMAL LOW (ref 4.22–5.81)
RDW: 13.1 % (ref 11.5–15.5)
WBC: 7.8 10*3/uL (ref 4.0–10.5)

## 2013-12-19 LAB — GLUCOSE, CAPILLARY: Glucose-Capillary: 123 mg/dL — ABNORMAL HIGH (ref 70–99)

## 2013-12-19 LAB — URINALYSIS, ROUTINE W REFLEX MICROSCOPIC
Bilirubin Urine: NEGATIVE
Glucose, UA: NEGATIVE mg/dL
Hgb urine dipstick: NEGATIVE
Ketones, ur: NEGATIVE mg/dL
Nitrite: NEGATIVE
Protein, ur: 30 mg/dL — AB
Specific Gravity, Urine: 1.021 (ref 1.005–1.030)
Urobilinogen, UA: 0.2 mg/dL (ref 0.0–1.0)
pH: 6 (ref 5.0–8.0)

## 2013-12-19 LAB — TROPONIN I: Troponin I: <0.3 ng/mL (ref <0.30)

## 2013-12-19 LAB — PRO B NATRIURETIC PEPTIDE: Pro B Natriuretic peptide (BNP): 504.2 pg/mL — ABNORMAL HIGH (ref 0–450)

## 2013-12-19 MED ORDER — DEXTROSE 5 % IV SOLN
1.0000 g | INTRAVENOUS | Status: DC
  Administered 2013-12-19: 1 g via INTRAVENOUS
  Filled 2013-12-19: qty 10

## 2013-12-19 MED ORDER — CEPHALEXIN 500 MG PO CAPS: 500.0000 mg | ORAL_CAPSULE | Freq: Four times a day (QID) | ORAL | Status: DC

## 2013-12-19 NOTE — ED Notes (Signed)
Pt c/o weakness and "feeling bad" x 1 week.  Denies changes in bowel or bladder habits, denies sob, denies chest pain.  Per wife, pt has been so week he has barely been able to get out of the chair.

## 2013-12-19 NOTE — Progress Notes (Addendum)
[facility] ED CM consulted by Dr Wilson Singer to see patient regarding possible [place]. Pt presented to [facility] ED with generalized weakness  Pt lives at home with wife as primary Teaching laboratory technician. Discussed the recommendation for Home Health, patient in agreement with disposition plan.  Discussed HH services, [place] agency list given. Choice given, Pt was familia and wife selected [facility], state, they are more familiar with the agency. Wife reports having a walker and 3:1 chair at home.  HH order placed for PT, RN, OT, and HHA. Contacted Mary.referral placed. Verified current address and phone number with patient and wife. Discussed plan with  Dr. Wilson Singer and RN both agreed with discharge plan. . Informed patient of referral placed with [facility],. Explained someone from [facility] would  be contacting them at the number provided within 24 [phone] to set up a visit time. Provided patient with [facility] brochure with phone number to contact the agency if any questions or concerns should arise about home care services Pt verbalized understanding using teach back method. No further CM needs identified

## 2013-12-19 NOTE — Discharge Instructions (Signed)
Fatigue °Fatigue is a feeling of tiredness, lack of energy, lack of motivation, or feeling tired all the time. Having enough rest, good nutrition, and reducing stress will normally reduce fatigue. Consult your caregiver if it persists. The nature of your fatigue will help your caregiver to find out its cause. The treatment is based on the cause.  °CAUSES  °There are many causes for fatigue. Most of the time, fatigue can be traced to one or more of your habits or routines. Most causes fit into one or more of three general areas. They are: °Lifestyle problems °· Sleep disturbances. °· Overwork. °· Physical exertion. °· Unhealthy habits. °· Poor eating habits or eating disorders. °· Alcohol and/or drug use . °· Lack of proper nutrition (malnutrition). °Psychological problems °· Stress and/or anxiety problems. °· Depression. °· Grief. °· Boredom. °Medical Problems or Conditions °· Anemia. °· Pregnancy. °· Thyroid gland problems. °· Recovery from major surgery. °· Continuous pain. °· Emphysema or asthma that is not well controlled °· Allergic conditions. °· Diabetes. °· Infections (such as mononucleosis). °· Obesity. °· Sleep disorders, such as sleep apnea. °· Heart failure or other heart-related problems. °· Cancer. °· Kidney disease. °· Liver disease. °· Effects of certain medicines such as antihistamines, cough and cold remedies, prescription pain medicines, heart and blood pressure medicines, drugs used for treatment of cancer, and some antidepressants. °SYMPTOMS  °The symptoms of fatigue include:  °· Lack of energy. °· Lack of drive (motivation). °· Drowsiness. °· Feeling of indifference to the surroundings. °DIAGNOSIS  °The details of how you feel help guide your caregiver in finding out what is causing the fatigue. You will be asked about your present and past health condition. It is important to review all medicines that you take, including prescription and non-prescription items. A thorough exam will be done.  You will be questioned about your feelings, habits, and normal lifestyle. Your caregiver may suggest blood tests, urine tests, or other tests to look for common medical causes of fatigue.  °TREATMENT  °Fatigue is treated by correcting the underlying cause. For example, if you have continuous pain or depression, treating these causes will improve how you feel. Similarly, adjusting the dose of certain medicines will help in reducing fatigue.  °HOME CARE INSTRUCTIONS  °· Try to get the required amount of good sleep every night. °· Eat a healthy and nutritious diet, and drink enough water throughout the day. °· Practice ways of relaxing (including yoga or meditation). °· Exercise regularly. °· Make plans to change situations that cause stress. Act on those plans so that stresses decrease over time. Keep your work and personal routine reasonable. °· Avoid street drugs and minimize use of alcohol. °· Start taking a daily multivitamin after consulting your caregiver. °SEEK MEDICAL CARE IF:  °· You have persistent tiredness, which cannot be accounted for. °· You have fever. °· You have unintentional weight loss. °· You have headaches. °· You have disturbed sleep throughout the night. °· You are feeling sad. °· You have constipation. °· You have dry skin. °· You have gained weight. °· You are taking any new or different medicines that you suspect are causing fatigue. °· You are unable to sleep at night. °· You develop any unusual swelling of your legs or other parts of your body. °SEEK IMMEDIATE MEDICAL CARE IF:  °· You are feeling confused. °· Your vision is blurred. °· You feel faint or pass out. °· You develop severe headache. °· You develop severe abdominal, pelvic, or   back pain.  You develop chest pain, shortness of breath, or an irregular or fast heartbeat.  You are unable to pass a normal amount of urine.  You develop abnormal bleeding such as bleeding from the rectum or you vomit blood.  You have thoughts  about harming yourself or committing suicide.  You are worried that you might harm someone else. MAKE SURE YOU:   Understand these instructions.  Will watch your condition.  Will get help right away if you are not doing well or get worse. Document Released: 08/21/2007 Document Revised: 01/16/2012 Document Reviewed: 08/21/2007 [facility] Patient Information 2014 [place].  Urinary Tract Infection Urinary tract infections (UTIs) can develop anywhere along your urinary tract. Your urinary tract is your body's drainage system for removing wastes and extra water. Your urinary tract includes two kidneys, two ureters, a bladder, and a urethra. Your kidneys are a pair of bean-shaped organs. Each kidney is about the size of your fist. They are located below your ribs, one on each side of your spine. CAUSES Infections are caused by microbes, which are microscopic organisms, including fungi, viruses, and bacteria. These organisms are so small that they can only be seen through a microscope. Bacteria are the microbes that most commonly cause UTIs. SYMPTOMS  Symptoms of UTIs may vary by age and gender of the patient and by the location of the infection. Symptoms in young women typically include a frequent and intense urge to urinate and a painful, burning feeling in the bladder or urethra during urination. Older women and men are more likely to be tired, shaky, and weak and have muscle aches and abdominal pain. A fever may mean the infection is in your kidneys. Other symptoms of a kidney infection include pain in your back or sides below the ribs, nausea, and vomiting. DIAGNOSIS To diagnose a UTI, your caregiver will ask you about your symptoms. Your caregiver also will ask to provide a urine sample. The urine sample will be tested for bacteria and white blood cells. White blood cells are made by your body to help fight infection. TREATMENT  Typically, UTIs can be treated with medication. Because most  UTIs are caused by a bacterial infection, they usually can be treated with the use of antibiotics. The choice of antibiotic and length of treatment depend on your symptoms and the type of bacteria causing your infection. HOME CARE INSTRUCTIONS  If you were prescribed antibiotics, take them exactly as your caregiver instructs you. Finish the medication even if you feel better after you have only taken some of the medication.  Drink enough water and fluids to keep your urine clear or pale yellow.  Avoid caffeine, tea, and carbonated beverages. They tend to irritate your bladder.  Empty your bladder often. Avoid holding urine for long periods of time.  Empty your bladder before and after sexual intercourse.  After a bowel movement, women should cleanse from front to back. Use each tissue only once. SEEK MEDICAL CARE IF:   You have back pain.  You develop a fever.  Your symptoms do not begin to resolve within 3 days. SEEK IMMEDIATE MEDICAL CARE IF:   You have severe back pain or lower abdominal pain.  You develop chills.  You have nausea or vomiting.  You have continued burning or discomfort with urination. MAKE SURE YOU:   Understand these instructions.  Will watch your condition.  Will get help right away if you are not doing well or get worse. Document Released: 08/03/2005 Document  Revised: 04/24/2012 Document Reviewed: 12/02/2011 [facility] Patient Information 2014 [place], [place].  Weakness Weakness is a lack of strength. It may be felt all over the body (generalized) or in one specific part of the body (focal). Some causes of weakness can be serious. You may need further medical evaluation, especially if you are elderly or you have a history of immunosuppression (such as chemotherapy or HIV), kidney disease, heart disease, or diabetes. CAUSES  Weakness can be caused by many different things, including:  Infection.  Physical exhaustion.  Internal bleeding or other  blood loss that results in a lack of red blood cells (anemia).  Dehydration. This cause is more common in elderly people.  Side effects or electrolyte abnormalities from medicines, such as pain medicines or sedatives.  Emotional distress, anxiety, or depression.  Circulation problems, especially severe peripheral arterial disease.  Heart disease, such as rapid atrial fibrillation, bradycardia, or heart failure.  Nervous system disorders, such as Guillain-Barr syndrome, multiple sclerosis, or stroke. DIAGNOSIS  To find the cause of your weakness, your caregiver will take your history and perform a physical exam. Lab tests or X-rays may also be ordered, if needed. TREATMENT  Treatment of weakness depends on the cause of your symptoms and can vary greatly. HOME CARE INSTRUCTIONS   Rest as needed.  Eat a well-balanced diet.  Try to get some exercise every day.  Only take over-the-counter or prescription medicines as directed by your caregiver. SEEK MEDICAL CARE IF:   Your weakness seems to be getting worse or spreads to other parts of your body.  You develop new aches or pains. SEEK IMMEDIATE MEDICAL CARE IF:   You cannot perform your normal daily activities, such as getting dressed and feeding yourself.  You cannot walk up and down stairs, or you feel exhausted when you do so.  You have shortness of breath or chest pain.  You have difficulty moving parts of your body.  You have weakness in only one area of the body or on only one side of the body.  You have a fever.  You have trouble speaking or swallowing.  You cannot control your bladder or bowel movements.  You have black or bloody vomit or stools. MAKE SURE YOU:  Understand these instructions.  Will watch your condition.  Will get help right away if you are not doing well or get worse. Document Released: 10/24/2005 Document Revised: 04/24/2012 Document Reviewed: 12/23/2011 [facility] Patient Information  2014 [place].

## 2013-12-19 NOTE — ED Provider Notes (Signed)
CSN: [id number]     Arrival date & time 12/19/13  1134 History   First MD Initiated Contact with Patient 12/19/13 1205     Chief Complaint  Patient presents with  . Weakness     (Consider location/radiation/quality/duration/timing/severity/associated sxs/prior Treatment) HPI  80 year old male presenting with wife for evaluation of generalized weakness. Patient states that he feels "just lousy." Symptoms worsening over the past week, worse over the past 2 days. Patient's weakness has progressed the point receptive difficulty just getting up from a chair. Denies any focalized weakness. Denies any acute pain. No SOB. No nausea or vomiting. Reports that his appetite has been good. No recent changes in medication. No recent falls. No fevers or chills. Patient is not sure why he may be more weak than he typically is.  Past Medical History  Diagnosis Date  . Obstructive sleep apnea   . Ischemic cardiomyopathy   . Atrial fibrillation   . AV block, complete s/p ablation   . Pacemaker [place]   . Cerebrovascular disease   . Hypercholesterolemia   . Hiatal hernia   . Gastritis   . Diverticulosis   . Colonic polyp   . BPH (benign prostatic hypertrophy)   . DJD (degenerative joint disease)   . Memory loss   . Anxiety   . Anemia   . Shingles   . Carotid artery occlusion   . Hypertension   . Pneumonia     hx of  . Bruises easily   . Parkinsonian features 09/18/2013   Past Surgical History  Procedure Laterality Date  . Pacemaker insertion    . Coronary artery bypass graft      x4 by Dr. Jobie Quaker 9/00  . Carotid endarterectomy      right 10/04 Dr. Scot Dock  . Anal fissure repair    . Hiatal hernia repair      incarcerated stomach with takedown and repair of hiatus and gastropexy  . Insert / replace / remove pacemaker    . Endarterectomy Left 06/27/2013    Procedure: Left Carotid Endarterectomy with Finesse patch angioplasty;  Surgeon: Angelia Mould, MD;  Location: [facility] OR;   Service: Vascular;  Laterality: Left;  . Hernia repair     Family History  Problem Relation Age of Onset  . Coronary artery disease Other    History  Substance Use Topics  . Smoking status: Former Smoker -- 1.50 packs/day for 50 years    Types: Cigarettes    Quit date: 11/08/1995  . Smokeless tobacco: Never Used  . Alcohol Use: No     Comment: quit drinking back in 1977    Review of Systems  All systems reviewed and negative, other than as noted in HPI.   Allergies  Review of patient's allergies indicates no known allergies.  Home Medications   Current Outpatient Rx  Name  Route  Sig  Dispense  Refill  . acetaminophen (TYLENOL) 500 MG tablet   Oral   Take 500 mg by mouth every 6 (six) hours as needed for pain.          Marland Kitchen ALPRAZolam (XANAX) 0.25 MG tablet   Oral   Take 0.25 mg by mouth 2 (two) times daily.         . clotrimazole-betamethasone (LOTRISONE) cream   Topical   Apply 1 application topically 2 (two) times daily as needed (legs). Applies to legs         . dextromethorphan-guaiFENesin (MUCINEX DM) 30-600 MG per 12 hr tablet  Oral   Take 1 tablet by mouth every 12 (twelve) hours.           . docusate sodium (COLACE) 100 MG capsule   Oral   Take 1 capsule (100 mg total) by mouth every 12 (twelve) hours.   60 capsule   0   . donepezil (ARICEPT) 10 MG tablet   Oral   Take 1 tablet (10 mg total) by mouth every morning.   30 tablet   6   . fesoterodine (TOVIAZ) 4 MG TB24   Oral   Take 4 mg by mouth every morning.          . finasteride (PROSCAR) 5 MG tablet   Oral   Take 5 mg by mouth every morning.          . latanoprost (XALATAN) 0.005 % ophthalmic solution   Both Eyes   Place 1 drop into both eyes daily.          Marland Kitchen lisinopril (PRINIVIL,ZESTRIL) 20 MG tablet   Oral   Take 20 mg by mouth every morning.         . memantine (NAMENDA) 10 MG tablet   Oral   Take 10 mg by mouth 2 (two) times daily.         Marland Kitchen  nystatin-triamcinolone (MYCOLOG II) cream   Topical   Apply 1 application topically daily as needed (irritated areas).          . QUEtiapine (SEROQUEL) 50 MG tablet   Oral   Take 50 mg by mouth 2 (two) times daily.          . simvastatin (ZOCOR) 40 MG tablet   Oral   Take 40 mg by mouth at bedtime.         Marland Kitchen tiZANidine (ZANAFLEX) 4 MG tablet   Oral   Take 1 tablet (4 mg total) by mouth 3 (three) times daily as needed.   50 tablet   1   . warfarin (COUMADIN) 5 MG tablet   Oral   Take 5 mg by mouth See admin instructions. Take 2.5 mg by mouth Tuesday, Thursday, Friday, and Sunday in the evening, then 5 mg by mouth Monday, Wednesday and Saturday in the evening.          BP 135/66  Pulse 70  Temp(Src) 97.9 F (36.6 C) (Oral)  Resp 22  Ht 6' (1.829 m)  Wt 207 lb 11.2 oz (94.212 kg)  BMI 28.16 kg/m2  SpO2 97% Physical Exam  Nursing note and vitals reviewed. Constitutional: He appears well-developed and well-nourished. No distress.  Laying in bed. NAD.   HENT:  Head: Normocephalic and atraumatic.  dentures  Eyes: Conjunctivae are normal. Right eye exhibits no discharge. Left eye exhibits no discharge.  Neck: Neck supple.  Cardiovascular: Normal rate, regular rhythm and normal heart sounds.  Exam reveals no gallop and no friction rub.   No murmur heard. Pulmonary/Chest: Effort normal and breath sounds normal. No respiratory distress.  Abdominal: Soft. He exhibits no distension. There is no tenderness.  Musculoskeletal: He exhibits no edema and no tenderness.  Neurological: He is alert. No cranial nerve deficit. He exhibits normal muscle tone. Coordination normal.  Speech clear. Content appropriate. Movements slow an deliberate. Generalized weakness. No focal motor deficit.   Skin: Skin is warm and dry.  Psychiatric: He has a normal mood and affect. His behavior is normal. Thought content normal.    ED Course  Procedures (including critical care time) Labs  Review Labs Reviewed  CBC - Abnormal; Notable for the following:    RBC 4.21 (*)    All other components within normal limits  BASIC METABOLIC PANEL - Abnormal; Notable for the following:    Glucose, Bld 118 (*)    GFR calc non Af Amer 56 (*)    GFR calc Af Amer 65 (*)    All other components within normal limits  URINALYSIS, ROUTINE W REFLEX MICROSCOPIC - Abnormal; Notable for the following:    Protein, ur 30 (*)    Leukocytes, UA MODERATE (*)    All other components within normal limits  GLUCOSE, CAPILLARY - Abnormal; Notable for the following:    Glucose-Capillary 123 (*)    All other components within normal limits  PRO B NATRIURETIC PEPTIDE - Abnormal; Notable for the following:    Pro B Natriuretic peptide (BNP) 504.2 (*)    All other components within normal limits  URINE MICROSCOPIC-ADD ON - Abnormal; Notable for the following:    Bacteria, UA FEW (*)    All other components within normal limits  URINE CULTURE  TROPONIN I   Imaging Review Ct Head Wo Contrast  12/19/2013   CLINICAL DATA:  Altered mental status  EXAM: CT HEAD WITHOUT CONTRAST  TECHNIQUE: Contiguous axial images were obtained from the base of the skull through the vertex without intravenous contrast. Study was obtained within 24 hr of patient's arrival at the emergency department.  COMPARISON:  September 25, 2013  FINDINGS: Moderate generalized atrophy is again noted. Prominence of the cisterna magna is an anatomic variant. There is no mass, hemorrhage, extra-axial fluid collection, or midline shift. There is extensive small vessel disease throughout the centra semiovale. There is no new gray-white compartment lesion. No acute infarct apparent.  Bony calvarium appears intact.  Mastoid air cells are clear.  IMPRESSION: Stable atrophy with extensive periventricular small vessel disease. No intracranial mass, hemorrhage, or acute appearing infarct.   Electronically Signed   By: Lowella Grip M.D.   On: 12/19/2013  14:38    EKG Interpretation    Date/Time:  Thursday December 19 2013 11:49:46 EST Ventricular Rate:  78 PR Interval:    QRS Duration: 160 QT Interval:  442 QTC Calculation: 503 R Axis:   -157 Text Interpretation:  Ventricular-paced rhythm No significant change since last tracing Confirmed by Nita Whitmire  MD, [place] ([id number]) on 12/19/2013 4:01:45 PM            MDM   Final diagnoses:  UTI (urinary tract infection)  Physical deconditioning    80 year old male with generalized weakness. Patient with no other complaints. On exam he has generalized weakness. No focal motor deficits. Workup has been pretty unremarkable aside from possible urinary tract infection. Because of his fatigue, will treat. I suspect a lot of the symptoms are secondary to general deconditioning. Patient would like to go home. He has medical decision making capability. I have concerns about safety as he lives with his elderly wife and no one else in the home. I tried to ambulate pt and it was a struggle for him to even stand.  Will try to have physical therapy evaluate patient in emergency room and make recommendations.  Pt already has a walker. Will have care management evaluate for further possible home health needs.    Virgel Manifold, MD 12/19/13 [phone]

## 2013-12-20 LAB — URINE CULTURE
Colony Count: NO GROWTH
Culture: NO GROWTH

## 2013-12-25 ENCOUNTER — Ambulatory Visit (INDEPENDENT_AMBULATORY_CARE_PROVIDER_SITE_OTHER): Payer: Medicare Other | Admitting: Internal Medicine

## 2013-12-25 ENCOUNTER — Telehealth: Payer: Self-pay | Admitting: Internal Medicine

## 2013-12-25 DIAGNOSIS — I4891 Unspecified atrial fibrillation: Secondary | ICD-10-CM

## 2013-12-25 DIAGNOSIS — I679 Cerebrovascular disease, unspecified: Secondary | ICD-10-CM

## 2013-12-25 DIAGNOSIS — Z7901 Long term (current) use of anticoagulants: Secondary | ICD-10-CM

## 2013-12-25 LAB — POCT INR: INR: 2.4

## 2013-12-25 NOTE — Telephone Encounter (Signed)
Noted, result received and addressed.  See anticoagulation note.

## 2013-12-25 NOTE — Telephone Encounter (Signed)
New message     Want Danae Chen or Jonelle Sidle know that home health will check pt's coumadin.  He is not able to come here

## 2014-01-01 ENCOUNTER — Telehealth: Payer: Self-pay | Admitting: Pulmonary Disease

## 2014-01-01 NOTE — Telephone Encounter (Signed)
I called spoke with spouse. She reports pt has been off toviaz x 1 month. Dr. Jeffie Pollock stopped this and changed pt medication to something different. Not sure of the name and threw the box away. I made her aware she needed to call Dr. Jeffie Pollock to okay the change. She voiced her understanding

## 2014-01-06 ENCOUNTER — Other Ambulatory Visit: Payer: Self-pay | Admitting: Pulmonary Disease

## 2014-01-06 DIAGNOSIS — E7800 Pure hypercholesterolemia, unspecified: Secondary | ICD-10-CM

## 2014-01-06 DIAGNOSIS — F411 Generalized anxiety disorder: Secondary | ICD-10-CM

## 2014-01-06 DIAGNOSIS — D649 Anemia, unspecified: Secondary | ICD-10-CM

## 2014-01-07 ENCOUNTER — Telehealth: Payer: Self-pay | Admitting: Pulmonary Disease

## 2014-01-07 NOTE — Telephone Encounter (Signed)
T[facility] with [facility] calling a/b this pat.336- [phone] is where she cn b reached pt still having systoms of uti would like another speciman done.Hillery Hunter

## 2014-01-08 ENCOUNTER — Ambulatory Visit: Payer: Medicare Other | Admitting: Family

## 2014-01-08 ENCOUNTER — Other Ambulatory Visit (HOSPITAL_COMMUNITY): Payer: Medicare Other

## 2014-01-08 NOTE — Telephone Encounter (Signed)
Per SN---  Ok to do another UA and urine culture.  thanks

## 2014-01-08 NOTE — Telephone Encounter (Signed)
I spoke with Otila Kluver at [facility] and she states the pt has been treated for a UTI in Feb. She states he is still having increased frequency, foul odor, and is urinating on himself a lot more. She is set to see the pt tomorrow and wants to know can she do another urine test and culture to see what is going on? Please advise. [place], CMA

## 2014-01-08 NOTE — Telephone Encounter (Signed)
i called and gave Tina VO. Nothing further needed

## 2014-01-17 ENCOUNTER — Ambulatory Visit (INDEPENDENT_AMBULATORY_CARE_PROVIDER_SITE_OTHER): Payer: Medicare Other | Admitting: Nurse Practitioner

## 2014-01-17 ENCOUNTER — Encounter: Payer: Self-pay | Admitting: Nurse Practitioner

## 2014-01-17 VITALS — BP 165/85 | HR 76 | Ht 71.0 in | Wt 205.0 lb

## 2014-01-17 DIAGNOSIS — F02818 Dementia in other diseases classified elsewhere, unspecified severity, with other behavioral disturbance: Secondary | ICD-10-CM

## 2014-01-17 DIAGNOSIS — F0281 Dementia in other diseases classified elsewhere with behavioral disturbance: Secondary | ICD-10-CM

## 2014-01-17 DIAGNOSIS — R259 Unspecified abnormal involuntary movements: Secondary | ICD-10-CM

## 2014-01-17 NOTE — Progress Notes (Signed)
[facility]  PATIENT: Ricky [name] DOB: Mar 28, 1931   REASON FOR VISIT: Followup for dementia    HISTORY OF PRESENT ILLNESS: Mr. Calcaterra, 80 year old male returns for followup. He has a history of dementia. He continues to be independent in dressing and feeding himself however he does need some help with bathing. He had a urinary tract infection in February and was admitted to the hospital. CT of the head has not changed at that time apparently he had fallen as well. His memory is about the same according to the patient and the wife. He has not had any wandering behavior, there have been no hallucinations. Appetite is stable. He returns for reevaluation    HISTORY: of  dementia. The patient remains independent in dressing is feeding himself his back leaving himself.  On Seroquel he is sleeping better and has less agitation. He has been" ugly" to his wife, he has been very vocal and disinhibited. He is easily agitated, aggressive and impulsive.  This geriatric depression score today was endorsed at 4 points, a [place] cognitive assessment test was performed. The patient scored on this memory test 16 points out of 30 possible points.  The patient and his wife report no hallucination, neither visual nor auditory. He has no REM BD, sleeps deep and long. He has no trouble to go to the [place], has not wandered around the house.  His appetite is stable and so is his sleep.  He is known to have untreated OSA, but his wife is not longer able to convince him to take medication, therapy etc. She seems afraid of her husband.  She is tearful, and the loss of their daughter has further depressed the two.    REVIEW OF SYSTEMS: Full 14 system review of systems performed and notable only for those listed, all others are neg:  Constitutional: N/A  Cardiovascular: N/A  Ear/Nose/Throat: N/A  Skin: N/A  Eyes: N/A  Respiratory: N/A  Gastroitestinal: N/A  Hematology/Lymphatic: N/A    Endocrine: N/A Musculoskeletal:N/A  Allergy/Immunology: N/A  Neurological: N/A Psychiatric: N/A   ALLERGIES: No Known Allergies  HOME MEDICATIONS: Outpatient Prescriptions Prior to Visit  Medication Sig Dispense Refill  . acetaminophen (TYLENOL) 500 MG tablet Take 500 mg by mouth every 6 (six) hours as needed for pain.       Marland Kitchen ALPRAZolam (XANAX) 0.25 MG tablet Take 0.25 mg by mouth 2 (two) times daily.      . cephALEXin (KEFLEX) 500 MG capsule Take 1 capsule (500 mg total) by mouth 4 (four) times daily.  16 capsule  0  . clotrimazole-betamethasone (LOTRISONE) cream Apply 1 application topically 2 (two) times daily as needed (legs). Applies to legs      . dextromethorphan-guaiFENesin (MUCINEX DM) 30-600 MG per 12 hr tablet Take 1 tablet by mouth every 12 (twelve) hours.        . docusate sodium (COLACE) 100 MG capsule Take 1 capsule (100 mg total) by mouth every 12 (twelve) hours.  60 capsule  0  . donepezil (ARICEPT) 10 MG tablet Take 1 tablet (10 mg total) by mouth every morning.  30 tablet  6  . fesoterodine (TOVIAZ) 4 MG TB24 Take 4 mg by mouth every morning.       . finasteride (PROSCAR) 5 MG tablet Take 5 mg by mouth every morning.       . latanoprost (XALATAN) 0.005 % ophthalmic solution Place 1 drop into both eyes daily.       Marland Kitchen lisinopril (  PRINIVIL,ZESTRIL) 20 MG tablet Take 20 mg by mouth every morning.      . memantine (NAMENDA) 10 MG tablet Take 10 mg by mouth 2 (two) times daily.      Marland Kitchen nystatin-triamcinolone (MYCOLOG II) cream Apply 1 application topically daily as needed (irritated areas).       . QUEtiapine (SEROQUEL) 50 MG tablet Take 50 mg by mouth 2 (two) times daily.       . simvastatin (ZOCOR) 40 MG tablet Take 40 mg by mouth at bedtime.      Marland Kitchen tiZANidine (ZANAFLEX) 4 MG tablet Take 1 tablet (4 mg total) by mouth 3 (three) times daily as needed.  50 tablet  1  . warfarin (COUMADIN) 5 MG tablet TAKE AS INSTRUCTED BY ANTICOAGULATION CLINIC  35 tablet  2   No  facility-administered medications prior to visit.    PAST MEDICAL HISTORY: Past Medical History  Diagnosis Date  . Obstructive sleep apnea   . Ischemic cardiomyopathy   . Atrial fibrillation   . AV block, complete s/p ablation   . Pacemaker [place]   . Cerebrovascular disease   . Hypercholesterolemia   . Hiatal hernia   . Gastritis   . Diverticulosis   . Colonic polyp   . BPH (benign prostatic hypertrophy)   . DJD (degenerative joint disease)   . Memory loss   . Anxiety   . Anemia   . Shingles   . Carotid artery occlusion   . Hypertension   . Pneumonia     hx of  . Bruises easily   . Parkinsonian features 09/18/2013    PAST SURGICAL HISTORY: Past Surgical History  Procedure Laterality Date  . Pacemaker insertion    . Coronary artery bypass graft      x4 by Dr. Jobie Quaker 9/00  . Carotid endarterectomy      right 10/04 Dr. Scot Dock  . Anal fissure repair    . Hiatal hernia repair      incarcerated stomach with takedown and repair of hiatus and gastropexy  . Insert / replace / remove pacemaker    . Endarterectomy Left 06/27/2013    Procedure: Left Carotid Endarterectomy with Finesse patch angioplasty;  Surgeon: Angelia Mould, MD;  Location: [facility] OR;  Service: Vascular;  Laterality: Left;  . Hernia repair      FAMILY HISTORY: Family History  Problem Relation Age of Onset  . Coronary artery disease Other     SOCIAL HISTORY: History   Social History  . Marital Status: Married    Spouse Name: arrie    Number of Children: 1  . Years of Education: N/A   Occupational History  . retired   .     Social History Main Topics  . Smoking status: Former Smoker -- 1.50 packs/day for 50 years    Types: Cigarettes    Quit date: 11/08/1995  . Smokeless tobacco: Never Used  . Alcohol Use: No     Comment: quit drinking back in 1977  . Drug Use: No  . Sexual Activity: Not Currently   Other Topics Concern  . Not on file   Social History Narrative   Married,  wife Bonnita Levan, 28yrs   1 daughter from 1st marriage who lives nearby   Son died Apr 05, 2010 in tractor accident.     PHYSICAL EXAM  Filed Vitals:   01/17/14 1105  BP: 165/85  Pulse: 76  Height: 5\' 11"  (1.803 m)  Weight: 205 lb (92.987 kg)   Body  mass index is 28.6 kg/(m^2).  Generalized: Well developed, in no acute distress  Head: normocephalic and atraumatic,. Oropharynx benign  Neck: Supple, no carotid bruits  Cardiac: Regular rate rhythm, no murmur  Musculoskeletal: No deformity   Neurological examination   Mentation: Alert MMSE 18/30, missing items in orientation, calculation and 2 of 3 recall. He is unable to write a sentence or copy a figure. Follows all commands speech and language fluent  Cranial nerve II-XII: Pupils were equal round reactive to light extraocular movements were full, visual field were full on confrontational test. Facial sensation and strength were normal. hearing was intact to finger rubbing bilaterally. Uvula tongue midline. head turning and shoulder shrug were normal and symmetric.Tongue protrusion into cheek strength was normal. Motor: normal bulk and tone, full strength in the BUE, BLE, mild cogwheeling at wrists and elbows  Sensory: normal and symmetric to light touch, pinprick, and  vibration  Coordination: finger-nose-finger, heel-to-shin bilaterally, no dysmetria, action tremor which is mild Reflexes: Brachioradialis 2/2, biceps 2/2, triceps 2/2, patellar 2/2, Achilles 2/2, plantar responses were flexor bilaterally. Gait and Station: Rising up from seated position without assistance, small steppage, no shuffling, decreased arm swing no assistive device, no difficulty with turns  DIAGNOSTIC DATA (LABS, IMAGING, TESTING) - I reviewed patient records, labs, notes, testing and imaging myself where available.  Lab Results  Component Value Date   WBC 7.8 12/19/2013   HGB 13.7 12/19/2013   HCT 39.9 12/19/2013   MCV 94.8 12/19/2013   PLT 160 12/19/2013        Component Value Date/Time   NA 141 12/19/2013 1240   K 4.2 12/19/2013 1240   CL 101 12/19/2013 1240   CO2 27 12/19/2013 1240   GLUCOSE 118* 12/19/2013 1240   BUN 20 12/19/2013 1240   CREATININE 1.19 12/19/2013 1240   CALCIUM 9.9 12/19/2013 1240   PROT 7.0 06/25/2013 1221   ALBUMIN 3.6 06/25/2013 1221   AST 21 06/25/2013 1221   ALT 14 06/25/2013 1221   ALKPHOS 82 06/25/2013 1221   BILITOT 0.5 06/25/2013 1221   GFRNONAA 56* 12/19/2013 1240   GFRAA 65* 12/19/2013 1240   Lab Results  Component Value Date   CHOL 158 09/16/2013   HDL 44.90 09/16/2013   LDLCALC 82 09/16/2013   LDLDIRECT 102.1 09/07/2011   TRIG 154.0* 09/16/2013   CHOLHDL 4 09/16/2013   Lab Results  Component Value Date   HGBA1C 6.5 09/16/2013    Lab Results  Component Value Date   TSH 2.88 09/16/2013      ASSESSMENT AND PLAN  80 y.o. year old male  has a past medical history of Obstructive sleep apnea; Ischemic cardiomyopathy; Atrial fibrillation;  Memory loss; Anxiety;  Carotid artery occlusion; Hypertension; and Parkinsonian features (09/18/2013). here to followup. Memory testing is stable. He is a patient of Dr. Brett Fairy who is out of the office  Continue Aricept 10 mg daily, does not need refills Continue Namenda 10 mg twice daily, does not need refills Continue Seroquel 2 times daily does not need refills Followup in 6 months Dennie Bible, [facility], [facility], [facility] [address], [place], [place] [address] [phone]

## 2014-01-17 NOTE — Progress Notes (Signed)
I have read the note, and I agree with the clinical assessment and plan.  Kalayna Noy KEITH   

## 2014-01-17 NOTE — Patient Instructions (Signed)
Continue Aricept 10 mg daily, does not need refills Continue Namenda 10 mg twice daily, does not need refills Continue Seroquel 2 times daily does not need refills Followup in 6 months

## 2014-01-20 ENCOUNTER — Other Ambulatory Visit: Payer: Self-pay | Admitting: Pulmonary Disease

## 2014-02-03 ENCOUNTER — Encounter: Payer: Self-pay | Admitting: Internal Medicine

## 2014-02-05 ENCOUNTER — Ambulatory Visit (INDEPENDENT_AMBULATORY_CARE_PROVIDER_SITE_OTHER): Payer: Medicare Other | Admitting: *Deleted

## 2014-02-05 ENCOUNTER — Encounter: Payer: Self-pay | Admitting: Family

## 2014-02-05 DIAGNOSIS — Z5181 Encounter for therapeutic drug level monitoring: Secondary | ICD-10-CM

## 2014-02-05 DIAGNOSIS — Z7901 Long term (current) use of anticoagulants: Secondary | ICD-10-CM

## 2014-02-05 DIAGNOSIS — I4891 Unspecified atrial fibrillation: Secondary | ICD-10-CM

## 2014-02-05 DIAGNOSIS — I679 Cerebrovascular disease, unspecified: Secondary | ICD-10-CM

## 2014-02-05 LAB — POCT INR: INR: 2.4

## 2014-02-06 ENCOUNTER — Ambulatory Visit (HOSPITAL_COMMUNITY)
Admission: RE | Admit: 2014-02-06 | Discharge: 2014-02-06 | Disposition: A | Discharge status: Home / Self Care | Payer: Medicare Other | Source: Ambulatory Visit | Attending: Family | Admitting: Family

## 2014-02-06 ENCOUNTER — Encounter: Payer: Self-pay | Admitting: Family

## 2014-02-06 ENCOUNTER — Ambulatory Visit (INDEPENDENT_AMBULATORY_CARE_PROVIDER_SITE_OTHER): Payer: Medicare Other | Admitting: Family

## 2014-02-06 VITALS — BP 178/90 | HR 76 | Temp 98.6°F | Resp 18 | Ht 72.0 in | Wt 200.0 lb

## 2014-02-06 DIAGNOSIS — I6529 Occlusion and stenosis of unspecified carotid artery: Secondary | ICD-10-CM | POA: Insufficient documentation

## 2014-02-06 DIAGNOSIS — Z48812 Encounter for surgical aftercare following surgery on the circulatory system: Secondary | ICD-10-CM

## 2014-02-06 NOTE — Patient Instructions (Signed)

## 2014-02-06 NOTE — Progress Notes (Signed)
Established Carotid Patient   History of Present Illness  Ricky [name] is a 80 y.o. male patient of Dr. Scot Dock who is s/p right CEA on 08/26/2003 and left CEA on 06/27/2013. He returns today for surveillance.  Patient has Negative history of TIA or stroke symptom.  The patient denies amaurosis fugax or monocular blindness.  The patient  denies facial drooping.  Pt. denies hemiplegia.  The patient denies receptive or expressive aphasia.  Pt. denies extremity weakness.   Patient denies New Medical or Surgical History.  Pt Diabetic: No Pt smoker: former smoker, quit many years ago  Pt meds include: Statin : Yes ASA: No Other anticoagulants/antiplatelets: coumadin, for atrial fib   Past Medical History  Diagnosis Date  . Obstructive sleep apnea   . Ischemic cardiomyopathy   . Atrial fibrillation   . AV block, complete s/p ablation   . Pacemaker [place]   . Cerebrovascular disease   . Hypercholesterolemia   . Hiatal hernia   . Gastritis   . Diverticulosis   . Colonic polyp   . BPH (benign prostatic hypertrophy)   . DJD (degenerative joint disease)   . Memory loss   . Anxiety   . Anemia   . Shingles   . Carotid artery occlusion   . Hypertension   . Pneumonia     hx of  . Bruises easily   . Parkinsonian features 09/18/2013    Social History History  Substance Use Topics  . Smoking status: Former Smoker -- 1.50 packs/day for 50 years    Types: Cigarettes    Quit date: 11/08/1995  . Smokeless tobacco: Never Used  . Alcohol Use: No     Comment: quit drinking back in 1977    Family History Family History  Problem Relation Age of Onset  . Coronary artery disease Other     Surgical History Past Surgical History  Procedure Laterality Date  . Pacemaker insertion    . Coronary artery bypass graft      x4 by Dr. Jobie Quaker 9/00  . Carotid endarterectomy      right 10/04 Dr. Scot Dock  . Anal fissure repair    . Hiatal hernia repair      incarcerated stomach  with takedown and repair of hiatus and gastropexy  . Insert / replace / remove pacemaker    . Endarterectomy Left 06/27/2013    Procedure: Left Carotid Endarterectomy with Finesse patch angioplasty;  Surgeon: Angelia Mould, MD;  Location: [facility] OR;  Service: Vascular;  Laterality: Left;  . Hernia repair      No Known Allergies  Current Outpatient Prescriptions  Medication Sig Dispense Refill  . acetaminophen (TYLENOL) 500 MG tablet Take 500 mg by mouth every 6 (six) hours as needed for pain.       Marland Kitchen ALPRAZolam (XANAX) 0.25 MG tablet Take 0.25 mg by mouth 2 (two) times daily.      . clotrimazole-betamethasone (LOTRISONE) cream Apply 1 application topically 2 (two) times daily as needed (legs). Applies to legs      . dextromethorphan-guaiFENesin (MUCINEX DM) 30-600 MG per 12 hr tablet Take 1 tablet by mouth every 12 (twelve) hours.        . docusate sodium (COLACE) 100 MG capsule Take 1 capsule (100 mg total) by mouth every 12 (twelve) hours.  60 capsule  0  . donepezil (ARICEPT) 10 MG tablet Take 1 tablet (10 mg total) by mouth every morning.  30 tablet  6  . fesoterodine (TOVIAZ)  4 MG TB24 Take 4 mg by mouth every morning.       . finasteride (PROSCAR) 5 MG tablet Take 5 mg by mouth every morning.       . latanoprost (XALATAN) 0.005 % ophthalmic solution Place 1 drop into both eyes daily.       Marland Kitchen lisinopril (PRINIVIL,ZESTRIL) 20 MG tablet TAKE 1 TABLET (20 MG TOTAL) BY MOUTH DAILY.  30 tablet  2  . memantine (NAMENDA) 10 MG tablet Take 10 mg by mouth 2 (two) times daily.      . Oxybutynin (GELNIQUE) 3 (28) % (MG/ACT) GEL Place onto the skin. Apply 3 pumps topically once a day      . QUEtiapine (SEROQUEL) 50 MG tablet Take 50 mg by mouth 2 (two) times daily.       . simvastatin (ZOCOR) 40 MG tablet TAKE ONE TABLET BY MOUTH AT BEDTIME  30 tablet  2  . tiZANidine (ZANAFLEX) 4 MG tablet Take 1 tablet (4 mg total) by mouth 3 (three) times daily as needed.  50 tablet  1  . warfarin (COUMADIN)  5 MG tablet TAKE AS INSTRUCTED BY ANTICOAGULATION CLINIC  35 tablet  2   No current facility-administered medications for this visit.    Review of Systems : See HPI for pertinent positives and negatives.  Physical Examination  Filed Vitals:   02/06/14 1128  BP: 178/90  Pulse: 76  Temp:   Resp:    Filed Weights   02/06/14 1126  Weight: 200 lb (90.719 kg)   Body mass index is 27.12 kg/(m^2).  General: WDWN male in NAD GAIT: normal Eyes: PERRLA Pulmonary:  Non-labored, CTAB, Negative  Rales, Negative rhonchi, & Negative wheezing.  Cardiac: regular Rhythm ,  Negative detected murmur, pacemaker palpated subcutaneous at left upper chest.  VASCULAR EXAM Carotid Bruits Left Right   Negative Negative     Radial pulses are 2+ palpable and equal.                                                                                                                            LE Pulses LEFT RIGHT       POPLITEAL  not palpable   not palpable       POSTERIOR TIBIAL   palpable    palpable        DORSALIS PEDIS      ANTERIOR TIBIAL  palpable   palpable     Gastrointestinal: soft, nontender, BS WNL, no r/g,  negative masses.  Musculoskeletal: Negative muscle atrophy/wasting. M/S 4/5 throughout, Extremities without ischemic changes.  Neurologic: A&O X 3; Appropriate Affect ; SENSATION ;normal;  Speech is normal CN 2-12 intact, Pain and light touch intact in extremities, Motor exam as listed above.   Non-Invasive Vascular Imaging CAROTID DUPLEX 02/06/2014   Patent right and left carotid endarterectomy sites with no evidence of hyperplasia or restenosis.  Assessment: Ricky [name] is a 80 y.o. male who is s/p  right CEA on 08/26/2003 and left CEA on 06/27/2013 and presents with asymptomatic patent right and left carotid endarterectomy sites with no evidence of hyperplasia or restenosis.  Plan: Follow-up in 6 months with Carotid Duplex scan.   I discussed in depth with the patient  the nature of atherosclerosis, and emphasized the importance of maximal medical management including strict control of blood pressure, blood glucose, and lipid levels, obtaining regular exercise, and continued cessation of smoking.  The patient is aware that without maximal medical management the underlying atherosclerotic disease process will progress, limiting the benefit of any interventions. The patient was given information about stroke prevention and what symptoms should prompt the patient to seek immediate medical care. Thank you for allowing [place] to participate in this patient's care.  Clemon Chambers, RN, MSN, FNP-C [facility] Office: [phone]  Clinic Physician: Oneida Alar  02/06/2014 11:38 AM

## 2014-02-10 ENCOUNTER — Ambulatory Visit (INDEPENDENT_AMBULATORY_CARE_PROVIDER_SITE_OTHER): Payer: Medicare Other | Admitting: Internal Medicine

## 2014-02-10 ENCOUNTER — Encounter: Payer: Self-pay | Admitting: Internal Medicine

## 2014-02-10 VITALS — BP 168/80 | HR 71 | Ht 72.0 in | Wt 200.0 lb

## 2014-02-10 DIAGNOSIS — Z950 Presence of cardiac pacemaker: Secondary | ICD-10-CM

## 2014-02-10 DIAGNOSIS — I4891 Unspecified atrial fibrillation: Secondary | ICD-10-CM

## 2014-02-10 DIAGNOSIS — I442 Atrioventricular block, complete: Secondary | ICD-10-CM

## 2014-02-10 LAB — MDC_IDC_ENUM_SESS_TYPE_INCLINIC
Battery Impedance: 3300 Ohm
Battery Voltage: 2.75 V
Date Time Interrogation Session: 20150406112940
Implantable Pulse Generator Model: 5386
Implantable Pulse Generator Serial Number: 1436753
Lead Channel Impedance Value: 492 Ohm
Lead Channel Impedance Value: 580 Ohm
Lead Channel Pacing Threshold Amplitude: 0.625 V
Lead Channel Pacing Threshold Pulse Width: 0.4 ms
Lead Channel Sensing Intrinsic Amplitude: 1.9 mV
Lead Channel Setting Pacing Amplitude: 2 V
Lead Channel Setting Pacing Pulse Width: 0.4 ms
Lead Channel Setting Sensing Sensitivity: 4 mV

## 2014-02-10 NOTE — Progress Notes (Signed)
Patient Care Team: Noralee Space, MD as PCP - General   HPI  Ricky [name] is a 80 y.o. male seen in followup for coronary artery disease. He status post CABG. He also has atrial arrhythmias and permanent atrial fibrillation. He is status post AV junction ablation and pacemaker implantation.  The patient denies chest pain, shortness of breath, nocturnal dyspnea, orthopnea or peripheral edema. There have been no palpitations, lightheadedness or syncope.  Echocardiogram 2011 EF 55%     Past Medical History  Diagnosis Date  . Obstructive sleep apnea   . Ischemic cardiomyopathy   . Atrial fibrillation   . AV block, complete s/p ablation   . Pacemaker [place]   . Cerebrovascular disease   . Hypercholesterolemia   . Hiatal hernia   . Gastritis   . Diverticulosis   . Colonic polyp   . BPH (benign prostatic hypertrophy)   . DJD (degenerative joint disease)   . Memory loss   . Anxiety   . Anemia   . Shingles   . Carotid artery occlusion   . Hypertension   . Pneumonia     hx of  . Bruises easily   . Parkinsonian features 09/18/2013    Past Surgical History  Procedure Laterality Date  . Pacemaker insertion    . Coronary artery bypass graft      x4 by Dr. Jobie Quaker 9/00  . Carotid endarterectomy      right 10/04 Dr. Scot Dock  . Anal fissure repair    . Hiatal hernia repair      incarcerated stomach with takedown and repair of hiatus and gastropexy  . Insert / replace / remove pacemaker    . Endarterectomy Left 06/27/2013    Procedure: Left Carotid Endarterectomy with Finesse patch angioplasty;  Surgeon: Angelia Mould, MD;  Location: [facility];  Service: Vascular;  Laterality: Left;  . Hernia repair      Current Outpatient Prescriptions  Medication Sig Dispense Refill  . acetaminophen (TYLENOL) 500 MG tablet Take 500 mg by mouth every 6 (six) hours as needed for pain.       Marland Kitchen ALPRAZolam (XANAX) 0.25 MG tablet Take 0.25 mg by mouth 2 (two) times daily.      .  clotrimazole-betamethasone (LOTRISONE) cream Apply 1 application topically 2 (two) times daily as needed (legs). Applies to legs      . dextromethorphan-guaiFENesin (MUCINEX DM) 30-600 MG per 12 hr tablet Take 1 tablet by mouth every 12 (twelve) hours.        . docusate sodium (COLACE) 100 MG capsule Take 1 capsule (100 mg total) by mouth every 12 (twelve) hours.  60 capsule  0  . donepezil (ARICEPT) 10 MG tablet Take 1 tablet (10 mg total) by mouth every morning.  30 tablet  6  . fesoterodine (TOVIAZ) 4 MG TB24 Take 4 mg by mouth every morning.       . finasteride (PROSCAR) 5 MG tablet Take 5 mg by mouth every morning.       . latanoprost (XALATAN) 0.005 % ophthalmic solution Place 1 drop into both eyes daily.       Marland Kitchen lisinopril (PRINIVIL,ZESTRIL) 20 MG tablet TAKE 1 TABLET (20 MG TOTAL) BY MOUTH DAILY.  30 tablet  2  . memantine (NAMENDA) 10 MG tablet Take 10 mg by mouth 2 (two) times daily.      . Oxybutynin (GELNIQUE) 3 (28) % (MG/ACT) GEL Place onto the skin. Apply 3 pumps  topically once a day      . QUEtiapine (SEROQUEL) 50 MG tablet Take 50 mg by mouth 2 (two) times daily.       . simvastatin (ZOCOR) 40 MG tablet TAKE ONE TABLET BY MOUTH AT BEDTIME  30 tablet  2  . tiZANidine (ZANAFLEX) 4 MG tablet Take 1 tablet (4 mg total) by mouth 3 (three) times daily as needed.  50 tablet  1  . warfarin (COUMADIN) 5 MG tablet TAKE AS INSTRUCTED BY ANTICOAGULATION CLINIC  35 tablet  2   No current facility-administered medications for this visit.    No Known Allergies  Review of Systems negative except from HPI and PMH  Physical Exam BP 168/80  Pulse 71  Ht 6' (1.829 m)  Wt 200 lb (90.719 kg)  BMI 27.12 kg/m2 Well developed and nourished in no acute distress HENT normal Neck supple with JVP-flat Clear Regular rate and rhythm, no murmurs or gallops Abd-soft with active BS No Clubbing cyanosis edema Skin-warm and dry A & Oriented  Grossly normal sensory and motor function  Device pocket  well healed; without hematoma or erythema.  There is no tethering     Assessment and  Plan  Atrial fibrillation  Coronary artery disease  S/p CABG  AV junction ablation/complete heart block  Pacemaker-[employer]. Jude The patient's device was interrogated.  The information was reviewed. No changes were made in the programming.    Continue anticoagulation. Otherwise stable.

## 2014-02-10 NOTE — Patient Instructions (Signed)

## 2014-03-16 ENCOUNTER — Other Ambulatory Visit: Payer: Self-pay | Admitting: Pulmonary Disease

## 2014-03-17 ENCOUNTER — Ambulatory Visit: Payer: Medicare Other | Admitting: Pulmonary Disease

## 2014-03-19 ENCOUNTER — Ambulatory Visit (INDEPENDENT_AMBULATORY_CARE_PROVIDER_SITE_OTHER): Payer: Medicare Other | Admitting: *Deleted

## 2014-03-19 DIAGNOSIS — Z5181 Encounter for therapeutic drug level monitoring: Secondary | ICD-10-CM

## 2014-03-19 DIAGNOSIS — Z7901 Long term (current) use of anticoagulants: Secondary | ICD-10-CM

## 2014-03-19 DIAGNOSIS — I679 Cerebrovascular disease, unspecified: Secondary | ICD-10-CM

## 2014-03-19 DIAGNOSIS — I4891 Unspecified atrial fibrillation: Secondary | ICD-10-CM

## 2014-03-19 LAB — POCT INR: INR: 2.2

## 2014-03-24 ENCOUNTER — Ambulatory Visit: Payer: Medicare Other | Admitting: Internal Medicine

## 2014-04-04 ENCOUNTER — Ambulatory Visit (INDEPENDENT_AMBULATORY_CARE_PROVIDER_SITE_OTHER): Payer: Medicare Other | Admitting: Internal Medicine

## 2014-04-04 ENCOUNTER — Encounter: Payer: Self-pay | Admitting: Internal Medicine

## 2014-04-04 VITALS — BP 142/80 | HR 81 | Temp 98.3°F | Resp 15 | Wt 202.2 lb

## 2014-04-04 DIAGNOSIS — R221 Localized swelling, mass and lump, neck: Secondary | ICD-10-CM

## 2014-04-04 DIAGNOSIS — R220 Localized swelling, mass and lump, head: Secondary | ICD-10-CM

## 2014-04-04 NOTE — Progress Notes (Signed)
Pre visit review using our clinic review tool, if applicable. No additional management support is needed unless otherwise documented below in the visit note. 

## 2014-04-04 NOTE — Progress Notes (Signed)
   Subjective:    Patient ID: Ricky [name], male    DOB: 1931-03-03, 80 y.o.   MRN: [id number]  HPI swelling of R sided jawline/neck and post auricular Per pt and his wife the swelling was noticed 04/01/14. There was no trauma, instead the pt reports waking up with the swelling. The pt's wife believes the swelling has gotten worse over the past three days. The swelling is not bothersome or painful to the patient, but he does report tenderness to touch. Denies SOB or dysphagia. There is no swelling of his tongue. Denies sore throat or cough. The pt's wife reports her husband told her that the inside of his R ear was hurting before the swelling occurred. He has not taken any medicine nor tried any topical mediators to make it feel better. Pt has full bottom and upper dentures. He has not been to the dentist in years. Both pt and his wife are poor historians.   Review of Systems  Constitutional: Negative for chills, fatigue and unexpected weight change.  HENT: Positive for ear pain, facial swelling, postnasal drip and rhinorrhea. Negative for ear discharge, sinus pressure, sneezing, sore throat and trouble swallowing.   Eyes: Negative for itching.  Respiratory: Negative for cough, shortness of breath and wheezing.   Cardiovascular: Negative for chest pain, palpitations and leg swelling.  Allergic/Immunologic: Positive for environmental allergies.       Objective:   Physical Exam        Assessment & Plan:  #1 mastoiditis; allergy component?

## 2014-04-04 NOTE — Patient Instructions (Addendum)
Use warm moist compresses 3-4  times a day to the Parotid area. I would recommend avoiding materials which are citrus or tart  in nature. ENT referral if no better. Report fever or rash.

## 2014-04-06 NOTE — Progress Notes (Signed)
   Subjective:    Patient ID: Ricky [name], male    DOB: May 28, 1931, 80 y.o.   MRN: [id number]  HPI  Painless swelling of R posterior mandibular area noted upon awakening 04/01/14 w/o antecedent trauma or trigger .Probable progression in size over past 3 days. Some tenderness to touch & possible R intra- auricular discomfort prior to swelling onset. No exacerbation with tart foods such as citrus intake.  PMH of R facial zoster.    Review of Systems No fever, chills , sweats , or weight loss. No frontal headache , facial pain or nasal purulence. No otic discharge. No angioedema symptoms.     Objective:   Physical Exam General appearance:appears somewhat chronically ill but adequately nourished; no acute distress or increased work of breathing is present.  No  lymphadenopathy about the head, neck, or axilla noted.   Eyes: No conjunctival inflammation or lid edema is present. There is no scleral icterus. Ptosis OD > OS  Ears:  External ear exam shows no significant lesions or deformities.  Otoscopic examination reveals some pale wax on R . TM partially visualized. No erythema or discharge. Nose:  External nasal examination shows no deformity or inflammation. Nasal mucosa are pink and moist without lesions or exudates. No septal dislocation or deviation.No obstruction to airflow.   Oral exam: Dentures; lips and gums are healthy appearing.There is no oropharyngeal erythema or exudate noted. Ill defined SQ fullness over R inferoposterior mandible. No definite submandibular LA. No TMJ pain with mastication maneuver.  Neck:  No deformities, thyromegaly, masses, or tenderness noted.     Heart:  Heart sounds distant.Normal rate and regular rhythm. S1 and S2 normal without gallop, murmur, click, rub or other extra sounds. S4  Lungs:Chest clear to auscultation;but BS decreased.No wheezes, rhonchi,rales ,or rubs present.No increased work of breathing.    Extremities:  No cyanosis, edema, or  clubbing  noted    Skin: Warm & dry ; exfoliative dermatitis over face w/o evidence of cellulitis or zoster  Neuropsych: Wife primary historian.Monotone speech pattern.Unable to give me years of [employer] service. Broad Parkinsonian gait; uses cane.         Assessment & Plan:  #1 R posterior/ inferior facial swelling w/o associated LA or cellulitis. Possible parotid enlargement See AVS

## 2014-04-07 ENCOUNTER — Ambulatory Visit (INDEPENDENT_AMBULATORY_CARE_PROVIDER_SITE_OTHER): Payer: Medicare Other | Admitting: Internal Medicine

## 2014-04-07 ENCOUNTER — Encounter: Payer: Self-pay | Admitting: Internal Medicine

## 2014-04-07 VITALS — BP 132/70 | HR 69 | Temp 98.5°F | Ht 71.0 in | Wt 200.8 lb

## 2014-04-07 DIAGNOSIS — I4891 Unspecified atrial fibrillation: Secondary | ICD-10-CM

## 2014-04-07 DIAGNOSIS — R7309 Other abnormal glucose: Secondary | ICD-10-CM

## 2014-04-07 DIAGNOSIS — F02818 Dementia in other diseases classified elsewhere, unspecified severity, with other behavioral disturbance: Secondary | ICD-10-CM

## 2014-04-07 DIAGNOSIS — I2510 Atherosclerotic heart disease of native coronary artery without angina pectoris: Secondary | ICD-10-CM

## 2014-04-07 DIAGNOSIS — F0281 Dementia in other diseases classified elsewhere with behavioral disturbance: Secondary | ICD-10-CM

## 2014-04-07 DIAGNOSIS — R739 Hyperglycemia, unspecified: Secondary | ICD-10-CM | POA: Insufficient documentation

## 2014-04-07 DIAGNOSIS — Z23 Encounter for immunization: Secondary | ICD-10-CM

## 2014-04-07 NOTE — Assessment & Plan Note (Signed)
CAD s/p CABG 07/1999 No anginal symptoms Med mgmt ongoing - continue same

## 2014-04-07 NOTE — Progress Notes (Signed)
Pre visit review using our clinic review tool, if applicable. No additional management support is needed unless otherwise documented below in the visit note. 

## 2014-04-07 NOTE — Assessment & Plan Note (Signed)
FH DM in mom, but no personal hx same Check a1c now

## 2014-04-07 NOTE — Assessment & Plan Note (Signed)
Follows with neuro for same On aricept, namenda, seroquel Wife is caregiver - looking for "more help"

## 2014-04-07 NOTE — Progress Notes (Signed)
Subjective:    Patient ID: Ricky [name], male    DOB: 05-Jun-1931, 80 y.o.   MRN: [id number]  HPI  Transfer to me from Lenna Gilford to establish with new PCP Reviewed chronic medical issues and interval medical events  Past Medical History  Diagnosis Date  . Obstructive sleep apnea     noncompliant with CPAP  . Ischemic cardiomyopathy   . Atrial fibrillation     on anticoag  . AV block, complete s/p ablation   . Pacemaker [place]   . Cerebrovascular disease   . Hypercholesterolemia   . Hiatal hernia   . Gastritis   . Diverticulosis   . Colonic polyp   . BPH (benign prostatic hypertrophy)   . DJD (degenerative joint disease)   . Memory loss   . Anxiety   . Anemia   . Shingles   . Carotid artery occlusion     s/p R CEA 08/2003, s/p L CEA 06/2013  . Hypertension   . Pneumonia     hx of  . Parkinsonian features   . CAD (coronary artery disease) 2000    s/p CABG x 4 07/1999    Review of Systems  Respiratory: Negative for cough and shortness of breath.   Cardiovascular: Negative for chest pain and leg swelling.  Psychiatric/Behavioral: Positive for confusion. Negative for behavioral problems and sleep disturbance. The patient is not nervous/anxious.        Objective:   Physical Exam  BP 132/70  Pulse 69  Temp(Src) 98.5 F (36.9 C) (Oral)  Ht 5\' 11"  (1.803 m)  Wt 200 lb 12.8 oz (91.082 kg)  BMI 28.02 kg/m2  SpO2 96% Wt Readings from Last 3 Encounters:  04/07/14 200 lb 12.8 oz (91.082 kg)  04/04/14 202 lb 3.2 oz (91.717 kg)  02/10/14 200 lb (90.719 kg)   Constitutional: he appears well-developed and well-nourished. No distress. wife at side Neck: Normal range of motion. Neck supple. No JVD present. No thyromegaly present.  Cardiovascular: Normal rate, regular rhythm and normal heart sounds.  No murmur heard. No BLE edema. Pulmonary/Chest: Effort normal and breath sounds normal. No respiratory distress. he has no wheezes.  Psychiatric: he has a normal mood and  affect. His behavior is normal. Judgment and thought content normal.   Lab Results  Component Value Date   WBC 7.8 12/19/2013   HGB 13.7 12/19/2013   HCT 39.9 12/19/2013   PLT 160 12/19/2013   GLUCOSE 118* 12/19/2013   CHOL 158 09/16/2013   TRIG 154.0* 09/16/2013   HDL 44.90 09/16/2013   LDLDIRECT 102.1 09/07/2011   LDLCALC 82 09/16/2013   ALT 14 06/25/2013   AST 21 06/25/2013   NA 141 12/19/2013   K 4.2 12/19/2013   CL 101 12/19/2013   CREATININE 1.19 12/19/2013   BUN 20 12/19/2013   CO2 27 12/19/2013   TSH 2.88 09/16/2013   PSA 0.91 09/07/2011   INR 2.2 03/19/2014   HGBA1C 6.5 09/16/2013    No results found.     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

## 2014-04-07 NOTE — Patient Instructions (Signed)
It was good to see you today.  We have reviewed your prior records including labs and tests today  Pneumonia vaccine updated today  Test(s) ordered today. Return when you are fasting. Your results will be released to [facility] (or called to you) after review, usually within 72hours after test completion. If any changes need to be made, you will be notified at that same time.  Medications reviewed and updated, no changes recommended at this time. Refill on medication(s) as discussed today.  Please schedule followup in 6 months, call sooner if problems.

## 2014-04-07 NOTE — Assessment & Plan Note (Signed)
CAF, on coumadin for same Rate controlled - follows with cards for same

## 2014-04-14 ENCOUNTER — Other Ambulatory Visit (INDEPENDENT_AMBULATORY_CARE_PROVIDER_SITE_OTHER): Payer: Medicare Other

## 2014-04-14 DIAGNOSIS — R7309 Other abnormal glucose: Secondary | ICD-10-CM

## 2014-04-14 DIAGNOSIS — R739 Hyperglycemia, unspecified: Secondary | ICD-10-CM

## 2014-04-14 LAB — HEMOGLOBIN A1C: Hgb A1c MFr Bld: 6.3 % (ref 4.6–6.5)

## 2014-04-29 ENCOUNTER — Other Ambulatory Visit: Payer: Self-pay | Admitting: Internal Medicine

## 2014-04-29 NOTE — Telephone Encounter (Signed)
Patient's wife calling to request refills for the patient on the following rx's. This will be the 1st time Dr. Asa Lente will rx these for the patient so the pharmacy was unable to send [place] a refill request.    donepezil (ARICEPT) 10 MG tablet  simvastatin (ZOCOR) 40 MG tablet traMADol (ULTRAM) 50 MG tablet  lisinopril (PRINIVIL,ZESTRIL) 20 MG tablet

## 2014-04-30 ENCOUNTER — Ambulatory Visit (INDEPENDENT_AMBULATORY_CARE_PROVIDER_SITE_OTHER): Payer: Medicare Other | Admitting: *Deleted

## 2014-04-30 DIAGNOSIS — I4891 Unspecified atrial fibrillation: Secondary | ICD-10-CM

## 2014-04-30 DIAGNOSIS — Z7901 Long term (current) use of anticoagulants: Secondary | ICD-10-CM

## 2014-04-30 DIAGNOSIS — I679 Cerebrovascular disease, unspecified: Secondary | ICD-10-CM

## 2014-04-30 DIAGNOSIS — Z5181 Encounter for therapeutic drug level monitoring: Secondary | ICD-10-CM

## 2014-04-30 LAB — POCT INR: INR: 2

## 2014-04-30 MED ORDER — SIMVASTATIN 40 MG PO TABS: ORAL_TABLET | ORAL | Status: DC

## 2014-04-30 MED ORDER — LISINOPRIL 20 MG PO TABS: ORAL_TABLET | ORAL | Status: DC

## 2014-04-30 MED ORDER — DONEPEZIL HCL 10 MG PO TABS: 10.0000 mg | ORAL_TABLET | Freq: Every morning | ORAL | Status: DC

## 2014-04-30 MED ORDER — TRAMADOL HCL 50 MG PO TABS: 50.0000 mg | ORAL_TABLET | Freq: Three times a day (TID) | ORAL | Status: DC | PRN

## 2014-04-30 NOTE — Telephone Encounter (Signed)
Done hardcopy to robin  

## 2014-04-30 NOTE — Telephone Encounter (Signed)
Called pt wife no answer LMOM refills has been sent to [facility]...Johny Chess

## 2014-04-30 NOTE — Telephone Encounter (Signed)
Sent maintenance meds to pharmacy. Pls advise on tramadol...Ricky [name]

## 2014-05-05 DIAGNOSIS — I4891 Unspecified atrial fibrillation: Secondary | ICD-10-CM

## 2014-05-23 ENCOUNTER — Ambulatory Visit (INDEPENDENT_AMBULATORY_CARE_PROVIDER_SITE_OTHER): Payer: Medicare Other | Admitting: Urology

## 2014-05-23 DIAGNOSIS — N401 Enlarged prostate with lower urinary tract symptoms: Secondary | ICD-10-CM

## 2014-05-23 DIAGNOSIS — N3941 Urge incontinence: Secondary | ICD-10-CM

## 2014-06-02 ENCOUNTER — Other Ambulatory Visit: Payer: Self-pay | Admitting: Pulmonary Disease

## 2014-06-04 ENCOUNTER — Other Ambulatory Visit: Payer: Self-pay

## 2014-06-04 ENCOUNTER — Telehealth: Payer: Self-pay | Admitting: Internal Medicine

## 2014-06-04 ENCOUNTER — Other Ambulatory Visit: Payer: Self-pay | Admitting: *Deleted

## 2014-06-04 MED ORDER — MEMANTINE HCL 10 MG PO TABS: 10.0000 mg | ORAL_TABLET | Freq: Two times a day (BID) | ORAL | Status: DC

## 2014-06-04 NOTE — Telephone Encounter (Signed)
Pt request refill for Namenda 10mg . Please advise.

## 2014-06-04 NOTE — Telephone Encounter (Signed)
Left msg on triage been faxing refill request on pt Namenda. Have not heard back from office. Sent refill electronically...Johny Chess

## 2014-06-11 ENCOUNTER — Ambulatory Visit (INDEPENDENT_AMBULATORY_CARE_PROVIDER_SITE_OTHER): Payer: Medicare Other | Admitting: Pharmacist

## 2014-06-11 DIAGNOSIS — I679 Cerebrovascular disease, unspecified: Secondary | ICD-10-CM

## 2014-06-11 DIAGNOSIS — Z7901 Long term (current) use of anticoagulants: Secondary | ICD-10-CM

## 2014-06-11 DIAGNOSIS — Z5181 Encounter for therapeutic drug level monitoring: Secondary | ICD-10-CM

## 2014-06-11 DIAGNOSIS — I4891 Unspecified atrial fibrillation: Secondary | ICD-10-CM

## 2014-06-11 LAB — POCT INR: INR: 2.2

## 2014-06-23 ENCOUNTER — Other Ambulatory Visit: Payer: Self-pay | Admitting: Internal Medicine

## 2014-07-01 ENCOUNTER — Other Ambulatory Visit: Payer: Self-pay | Admitting: Pulmonary Disease

## 2014-07-01 ENCOUNTER — Other Ambulatory Visit: Payer: Self-pay | Admitting: Neurology

## 2014-07-01 ENCOUNTER — Other Ambulatory Visit: Payer: Self-pay | Admitting: Internal Medicine

## 2014-07-05 ENCOUNTER — Other Ambulatory Visit: Payer: Self-pay | Admitting: Internal Medicine

## 2014-07-05 ENCOUNTER — Other Ambulatory Visit: Payer: Self-pay | Admitting: Pulmonary Disease

## 2014-07-07 ENCOUNTER — Encounter: Payer: Self-pay | Admitting: Internal Medicine

## 2014-07-07 NOTE — Telephone Encounter (Signed)
OK X1 

## 2014-07-07 NOTE — Telephone Encounter (Signed)
md out pls advise on refill...Ricky [name]

## 2014-07-08 NOTE — Telephone Encounter (Signed)
Faxed script back to [facility]...Ricky [name]

## 2014-07-22 ENCOUNTER — Encounter (INDEPENDENT_AMBULATORY_CARE_PROVIDER_SITE_OTHER): Payer: Self-pay

## 2014-07-22 ENCOUNTER — Ambulatory Visit (INDEPENDENT_AMBULATORY_CARE_PROVIDER_SITE_OTHER): Payer: Medicare Other | Admitting: Nurse Practitioner

## 2014-07-22 ENCOUNTER — Ambulatory Visit (INDEPENDENT_AMBULATORY_CARE_PROVIDER_SITE_OTHER): Payer: Medicare Other | Admitting: *Deleted

## 2014-07-22 ENCOUNTER — Encounter: Payer: Self-pay | Admitting: Nurse Practitioner

## 2014-07-22 VITALS — BP 171/77 | HR 77 | Wt 194.6 lb

## 2014-07-22 DIAGNOSIS — Z5181 Encounter for therapeutic drug level monitoring: Secondary | ICD-10-CM

## 2014-07-22 DIAGNOSIS — I679 Cerebrovascular disease, unspecified: Secondary | ICD-10-CM

## 2014-07-22 DIAGNOSIS — R259 Unspecified abnormal involuntary movements: Secondary | ICD-10-CM

## 2014-07-22 DIAGNOSIS — Z7901 Long term (current) use of anticoagulants: Secondary | ICD-10-CM

## 2014-07-22 DIAGNOSIS — F02818 Dementia in other diseases classified elsewhere, unspecified severity, with other behavioral disturbance: Secondary | ICD-10-CM

## 2014-07-22 DIAGNOSIS — F0281 Dementia in other diseases classified elsewhere with behavioral disturbance: Secondary | ICD-10-CM

## 2014-07-22 DIAGNOSIS — I4891 Unspecified atrial fibrillation: Secondary | ICD-10-CM

## 2014-07-22 LAB — POCT INR: INR: 2.1

## 2014-07-22 NOTE — Progress Notes (Signed)
[facility]  PATIENT: Ricky [name] DOB: 02-Dec-1930   REASON FOR VISIT: Followup for memory loss   HISTORY OF PRESENT ILLNESS:[name], 80 year old male returns for followup. He has a history of dementia. He continues to be independent in dressing and feeding himself however he does need some help with bathing. He had a urinary tract infection in February 2015 and was admitted to the hospital. CT of the head has not changed at that time apparently he had fallen as well. His memory is a little worse according to the patient and the wife. He has not had any wandering behavior, there have been no hallucinations. Appetite is stable however it is noted he has lost 9 pounds in 6 months. He is sleeping well. He has some right jaw and neck  swelling since May. He is using warm compresses to the area. He returns for reevaluation   HISTORY: of dementia. The patient remains independent in dressing is feeding himself his back leaving himself.  On Seroquel he is sleeping better and has less agitation. He has been" ugly" to his wife, he has been very vocal and disinhibited. He is easily agitated, aggressive and impulsive.  This geriatric depression score today was endorsed at 4 points, a [place] cognitive assessment test was performed. The patient scored on this memory test 16 points out of 30 possible points.  The patient and his wife report no hallucination, neither visual nor auditory. He has no REM BD, sleeps deep and long. He has no trouble to go to the [place], has not wandered around the house.  His appetite is stable and so is his sleep.  He is known to have untreated OSA, but his wife is not longer able to convince him to take medication, therapy etc. She seems afraid of her husband.  She is tearful, and the loss of their daughter has further depressed the two.      REVIEW OF SYSTEMS: Full 14 system review of systems performed and notable only for those listed, all others  are neg:  Constitutional: N/A  Cardiovascular: N/A  Ear/Nose/Throat: N/A  Skin: N/A  Eyes: N/A  Respiratory: N/A  Gastroitestinal: N/A  Hematology/Lymphatic: N/A  Endocrine: N/A Musculoskeletal: Neck swelling  Allergy/Immunology: N/A  Neurological: Memory loss Psychiatric: N/A Sleep : NA   ALLERGIES: No Known Allergies  HOME MEDICATIONS: Outpatient Prescriptions Prior to Visit  Medication Sig Dispense Refill  . acetaminophen (TYLENOL) 500 MG tablet Take 500 mg by mouth every 6 (six) hours as needed for pain.       . clotrimazole-betamethasone (LOTRISONE) cream Apply 1 application topically 2 (two) times daily as needed (legs). Applies to legs      . dextromethorphan-guaiFENesin (MUCINEX DM) 30-600 MG per 12 hr tablet Take 1 tablet by mouth every 12 (twelve) hours.       . docusate sodium (COLACE) 100 MG capsule Take 1 capsule (100 mg total) by mouth every 12 (twelve) hours.  60 capsule  0  . donepezil (ARICEPT) 10 MG tablet Take 1 tablet (10 mg total) by mouth every morning.  30 tablet  5  . finasteride (PROSCAR) 5 MG tablet Take 5 mg by mouth every morning.       . latanoprost (XALATAN) 0.005 % ophthalmic solution Place 1 drop into both eyes daily.       Marland Kitchen lisinopril (PRINIVIL,ZESTRIL) 20 MG tablet TAKE 1 TABLET (20 MG TOTAL) BY MOUTH DAILY.  30 tablet  5  . memantine (NAMENDA) 10 MG  tablet Take 1 tablet (10 mg total) by mouth 2 (two) times daily.  180 tablet  3  . QUEtiapine (SEROQUEL) 50 MG tablet TAKE TWO TABLETS BY MOUTH DAILY  60 tablet  3  . simvastatin (ZOCOR) 40 MG tablet TAKE ONE TABLET BY MOUTH AT BEDTIME  30 tablet  5  . timolol (BETIMOL) 0.5 % ophthalmic solution       . tiZANidine (ZANAFLEX) 4 MG tablet Take 1 tablet (4 mg total) by mouth 3 (three) times daily as needed.  50 tablet  1  . traMADol (ULTRAM) 50 MG tablet TAKE ONE TABLET BY MOUTH THREE TIMES DAILY AS NEEDED  90 tablet  0  . warfarin (COUMADIN) 5 MG tablet TAKE AS INSTRUCTED BY ANTICOAGULATION CLINIC  35  tablet  1  . ALPRAZolam (XANAX) 0.25 MG tablet TAKE 1/2-1 TAB 3 TIMES A DAY AS NEEDED FOR ANXIETY  90 tablet  0  . Oxybutynin (GELNIQUE) 3 (28) % (MG/ACT) GEL Place onto the skin. Apply 3 pumps topically once a day       No facility-administered medications prior to visit.    PAST MEDICAL HISTORY: Past Medical History  Diagnosis Date  . Obstructive sleep apnea     noncompliant with CPAP  . Ischemic cardiomyopathy   . Atrial fibrillation     on anticoag  . AV block, complete s/p ablation   . Pacemaker [place]   . Cerebrovascular disease   . Hypercholesterolemia   . Hiatal hernia   . Gastritis   . Diverticulosis   . Colonic polyp   . BPH (benign prostatic hypertrophy)   . DJD (degenerative joint disease)   . Memory loss   . Anxiety   . Anemia   . Shingles   . Carotid artery occlusion     s/p R CEA 08/2003, s/p L CEA 06/2013  . Hypertension   . Pneumonia     hx of  . Parkinsonian features   . CAD (coronary artery disease) 2000    s/p CABG x 4 07/1999  . Glaucoma     PAST SURGICAL HISTORY: Past Surgical History  Procedure Laterality Date  . Pacemaker insertion    . Coronary artery bypass graft  07/1999    x4 by Dr. Jobie Quaker 9/00  . Carotid endarterectomy Right 08/2003    right 10/04 Dr. Scot Dock  . Anal fissure repair    . Hiatal hernia repair      incarcerated stomach with takedown and repair of hiatus and gastropexy  . Insert / replace / remove pacemaker    . Endarterectomy Left 06/27/2013    Procedure: Left Carotid Endarterectomy with Finesse patch angioplasty;  Surgeon: Angelia Mould, MD;  Location: [facility] OR;  Service: Vascular;  Laterality: Left;  . Hernia repair      FAMILY HISTORY: Family History  Problem Relation Age of Onset  . Coronary artery disease Other     SOCIAL HISTORY: History   Social History  . Marital Status: Married    Spouse Name: arrie    Number of Children: 1  . Years of Education: N/A   Occupational History  . retired     .     Social History Main Topics  . Smoking status: Former Smoker -- 1.50 packs/day for 50 years    Types: Cigarettes    Quit date: 11/08/1995  . Smokeless tobacco: Never Used  . Alcohol Use: No     Comment: quit drinking back in 1977  . Drug Use:  No  . Sexual Activity: Not Currently   Other Topics Concern  . Not on file   Social History Narrative   Married, wife Bonnita Levan, 78yrs   1 daughter from 1st marriage who lives nearby   Son died 2010/03/28 in tractor accident.     PHYSICAL EXAM  Filed Vitals:   07/22/14 1406  BP: 171/77  Pulse: 77  Weight: 194 lb 9.6 oz (88.27 kg)   Body mass index is 27.15 kg/(m^2). Generalized: Well developed, in no acute distress  Head: normocephalic and atraumatic,. Oropharynx benign  Neck: right sided jaw and neck swelling Musculoskeletal: No deformity  Neurological examination  Mentation: Alert MMSE 16/30, missing items in orientation, calculation and 3 of 3 recall. He is unable to copy a figure. AFT 8. Follows all commands speech and language fluent  Cranial nerve II-XII: Pupils were equal round reactive to light extraocular movements were full, visual field were full on confrontational test. Facial sensation and strength were normal. hearing was intact to finger rubbing bilaterally. Uvula tongue midline. head turning and shoulder shrug were normal and symmetric.Tongue protrusion into cheek strength was normal.  Motor: normal bulk and tone, full strength in the BUE, BLE, mild cogwheeling at wrists and elbows  Coordination: finger-nose-finger, heel-to-shin bilaterally, no dysmetria, action tremor which is mild  Reflexes: Brachioradialis 2/2, biceps 2/2, triceps 2/2, patellar 2/2, Achilles 2/2, plantar responses were flexor bilaterally.  Gait and Station: Rising up from seated position without assistance, small steppage, no shuffling, decreased arm swing no assistive device, no difficulty with turns    DIAGNOSTIC DATA (LABS, IMAGING, TESTING) - I  reviewed patient records, labs, notes, testing and imaging myself where available.  Lab Results  Component Value Date   WBC 7.8 12/19/2013   HGB 13.7 12/19/2013   HCT 39.9 12/19/2013   MCV 94.8 12/19/2013   PLT 160 12/19/2013      Component Value Date/Time   NA 141 12/19/2013 1240   K 4.2 12/19/2013 1240   CL 101 12/19/2013 1240   CO2 27 12/19/2013 1240   GLUCOSE 118* 12/19/2013 1240   BUN 20 12/19/2013 1240   CREATININE 1.19 12/19/2013 1240   CALCIUM 9.9 12/19/2013 1240   PROT 7.0 06/25/2013 1221   ALBUMIN 3.6 06/25/2013 1221   AST 21 06/25/2013 1221   ALT 14 06/25/2013 1221   ALKPHOS 82 06/25/2013 1221   BILITOT 0.5 06/25/2013 1221   GFRNONAA 56* 12/19/2013 1240   GFRAA 65* 12/19/2013 1240   Lab Results  Component Value Date   CHOL 158 09/16/2013   HDL 44.90 09/16/2013   LDLCALC 82 09/16/2013   LDLDIRECT 102.1 09/07/2011   TRIG 154.0* 09/16/2013   CHOLHDL 4 09/16/2013   Lab Results  Component Value Date   HGBA1C 6.3 04/14/2014    Lab Results  Component Value Date   TSH 2.88 09/16/2013      ASSESSMENT AND PLAN  80 y.o. year old male  has a past medical history of Obstructive sleep apnea; Ischemic cardiomyopathy; Atrial fibrillation; Pacemaker [place]; Cerebrovascular disease; Hypercholesterolemia; in fact he  BPH (benign prostatic hypertrophy); DJD (degenerative joint disease); Memory loss; Anxiety; Anemia;  Carotid artery occlusion; Hypertension;  Parkinsonian features; CAD (coronary artery disease) (2000); here to follow up for memory loss.  Continue Namenda at current dose, refills primary care Continued Aricept at current meds refills primary care  continue Seroquel at current dose does not need refills Followup in 6 months next visit with Dr. Olene Floss Cecille Rubin, [facility], [facility], APRN  [facility] [address] [place], [place] [address] [phone]

## 2014-07-22 NOTE — Patient Instructions (Signed)
Continue Namenda at current dose, refills primary care Continued Aricept at current meds refills primary care  continue Seroquel at current dose does not need refills Followup in 6 months next visit with Dr. Brett Fairy

## 2014-07-23 NOTE — Progress Notes (Signed)
I agree with the assessment and plan as directed by NP .The patient is known to me .   Grayce Budden, MD  

## 2014-07-29 ENCOUNTER — Ambulatory Visit: Payer: Medicare Other | Admitting: Nurse Practitioner

## 2014-08-04 ENCOUNTER — Encounter: Payer: Self-pay | Admitting: Internal Medicine

## 2014-08-04 DIAGNOSIS — I4891 Unspecified atrial fibrillation: Secondary | ICD-10-CM

## 2014-08-07 ENCOUNTER — Encounter: Payer: Self-pay | Admitting: Family

## 2014-08-08 ENCOUNTER — Ambulatory Visit: Payer: Medicare Other | Admitting: Family

## 2014-08-08 ENCOUNTER — Other Ambulatory Visit (HOSPITAL_COMMUNITY): Payer: Medicare Other

## 2014-08-11 ENCOUNTER — Ambulatory Visit (INDEPENDENT_AMBULATORY_CARE_PROVIDER_SITE_OTHER): Payer: Medicare Other | Admitting: *Deleted

## 2014-08-11 DIAGNOSIS — Z950 Presence of cardiac pacemaker: Secondary | ICD-10-CM

## 2014-08-11 DIAGNOSIS — I4891 Unspecified atrial fibrillation: Secondary | ICD-10-CM

## 2014-08-11 DIAGNOSIS — I442 Atrioventricular block, complete: Secondary | ICD-10-CM

## 2014-08-11 LAB — MDC_IDC_ENUM_SESS_TYPE_INCLINIC
Battery Impedance: 4100 Ohm
Battery Voltage: 2.74 V
Brady Statistic RA Percent Paced: 1.4 %
Date Time Interrogation Session: 20151005124304
Implantable Pulse Generator Model: 5386
Implantable Pulse Generator Serial Number: 1436753
Lead Channel Impedance Value: 559 Ohm
Lead Channel Pacing Threshold Pulse Width: 0.4 ms
Lead Channel Sensing Intrinsic Amplitude: 0.8 mV
Lead Channel Setting Pacing Amplitude: 2 V
Lead Channel Setting Sensing Sensitivity: 4 mV
MDC IDC MSMT LEADCHNL RV IMPEDANCE VALUE: 486 Ohm
MDC IDC MSMT LEADCHNL RV PACING THRESHOLD AMPLITUDE: 0.625 V
MDC IDC SET LEADCHNL RV PACING PULSEWIDTH: 0.4 ms
MDC IDC STAT BRADY RV PERCENT PACED: 99 % — AB

## 2014-08-11 NOTE — Progress Notes (Signed)
Pacemaker check in clinic. Normal device function. Threshold, sensing, impedances consistent with previous measurements. Device programmed to maximize longevity. 11 mode switches---EGMs off, longest 90days + coumadin. Device programmed at appropriate safety margins. Histogram distribution appropriate for patient activity level. Device programmed to optimize intrinsic conduction. Estimated longevity 1.75-2.53yrs. ROV w/ Dr. Caryl Comes in 58mo.

## 2014-08-20 ENCOUNTER — Encounter: Payer: Self-pay | Admitting: Internal Medicine

## 2014-09-01 ENCOUNTER — Ambulatory Visit (INDEPENDENT_AMBULATORY_CARE_PROVIDER_SITE_OTHER): Payer: Medicare Other | Admitting: Pharmacist

## 2014-09-01 DIAGNOSIS — Z7901 Long term (current) use of anticoagulants: Secondary | ICD-10-CM

## 2014-09-01 DIAGNOSIS — I4891 Unspecified atrial fibrillation: Secondary | ICD-10-CM

## 2014-09-01 DIAGNOSIS — Z5181 Encounter for therapeutic drug level monitoring: Secondary | ICD-10-CM

## 2014-09-01 DIAGNOSIS — Z23 Encounter for immunization: Secondary | ICD-10-CM

## 2014-09-01 DIAGNOSIS — I679 Cerebrovascular disease, unspecified: Secondary | ICD-10-CM

## 2014-09-01 LAB — POCT INR: INR: 4

## 2014-09-15 ENCOUNTER — Other Ambulatory Visit: Payer: Self-pay | Admitting: Otolaryngology

## 2014-09-15 DIAGNOSIS — H9211 Otorrhea, right ear: Secondary | ICD-10-CM

## 2014-09-15 DIAGNOSIS — K118 Other diseases of salivary glands: Secondary | ICD-10-CM

## 2014-09-17 ENCOUNTER — Ambulatory Visit
Admission: RE | Admit: 2014-09-17 | Discharge: 2014-09-17 | Disposition: A | Discharge status: Home / Self Care | Payer: Medicare Other | Source: Ambulatory Visit | Attending: Otolaryngology | Admitting: Otolaryngology

## 2014-09-17 DIAGNOSIS — H9211 Otorrhea, right ear: Secondary | ICD-10-CM

## 2014-09-17 DIAGNOSIS — K118 Other diseases of salivary glands: Secondary | ICD-10-CM

## 2014-09-17 MED ORDER — IOHEXOL 300 MG/ML  SOLN
75.0000 mL | Freq: Once | INTRAMUSCULAR | Status: AC | PRN
  Administered 2014-09-17: 75 mL via INTRAVENOUS

## 2014-09-22 ENCOUNTER — Ambulatory Visit (INDEPENDENT_AMBULATORY_CARE_PROVIDER_SITE_OTHER): Payer: Medicare Other | Admitting: Pharmacist

## 2014-09-22 DIAGNOSIS — Z5181 Encounter for therapeutic drug level monitoring: Secondary | ICD-10-CM

## 2014-09-22 DIAGNOSIS — I679 Cerebrovascular disease, unspecified: Secondary | ICD-10-CM

## 2014-09-22 DIAGNOSIS — I4891 Unspecified atrial fibrillation: Secondary | ICD-10-CM

## 2014-09-22 DIAGNOSIS — Z7901 Long term (current) use of anticoagulants: Secondary | ICD-10-CM

## 2014-09-22 LAB — POCT INR: INR: 2.1

## 2014-09-24 ENCOUNTER — Encounter: Payer: Self-pay | Admitting: Neurology

## 2014-09-30 ENCOUNTER — Encounter: Payer: Self-pay | Admitting: Neurology

## 2014-10-07 ENCOUNTER — Ambulatory Visit: Payer: Medicare Other | Admitting: Internal Medicine

## 2014-10-20 ENCOUNTER — Ambulatory Visit (INDEPENDENT_AMBULATORY_CARE_PROVIDER_SITE_OTHER): Payer: Medicare Other | Admitting: *Deleted

## 2014-10-20 DIAGNOSIS — Z5181 Encounter for therapeutic drug level monitoring: Secondary | ICD-10-CM

## 2014-10-20 DIAGNOSIS — Z7901 Long term (current) use of anticoagulants: Secondary | ICD-10-CM

## 2014-10-20 DIAGNOSIS — I679 Cerebrovascular disease, unspecified: Secondary | ICD-10-CM

## 2014-10-20 DIAGNOSIS — I4891 Unspecified atrial fibrillation: Secondary | ICD-10-CM

## 2014-10-20 LAB — POCT INR: INR: 2.5

## 2014-10-24 NOTE — Progress Notes (Addendum)
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 Nicolette Bang "6-9 months of problems with the right parotid gland. These include swelling and pain. Has had courses of abx. Developed otalgia and had some bloody otorrhea, treated with ciprodex and cleaning, which improved his symptoms. However, the parotid gland has continued swelling"."  Biopsies revealed:   10/08/14   Nutrition Status Yes No Comments  Weight changes? [x]  []  Has lost 13 lbs since 06/2013  Swallowing concerns? [x]  []  Has pain in his right gums, patient has dentures top and bottom  PEG? []  [x]     Referrals Yes No Comments  Social Work? []  [x]    Dentistry? []  [x]    Swallowing therapy? []  [x]    Nutrition? []  [x]    Med/Onc? [x]  []  Dr. Alen Blew 11/13/14   Safety Issues Yes No Comments  Prior radiation? []  [x]    Pacemaker/ICD? [x]  []    Possible current pregnancy? []  [x]    Is the patient on methotrexate? []  [x]     Tobacco/Marijuana/Snuff/ETOH use: Quit smoking in 1997, smoked 1.5 ppd for 50 years, no etoh use.  Past/Anticipated interventions by otolaryngology, if any: possible total parotidectomy with facial nerve sacrifice, resection of overlying skin and portions of the ear canal, along with a disabling radical neck dissection  Past/Anticipated interventions by medical oncology, if any: Apt with Dr. Osker Mason on 11/13/13  Current Complaints / other details:  Patient is here with his wife.  Patient has a pacemaker, left chest.  Patient has dementia.

## 2014-10-28 ENCOUNTER — Telehealth: Payer: Self-pay | Admitting: *Deleted

## 2014-10-28 NOTE — Telephone Encounter (Signed)
Called pt to introduce myself as the oncology nurse navigator that works with Dr. Sondra Come, spoke with patient and his wife.  I indicated that I would be joining them during his appt with Dr. Sondra Come this Thursday, that I would tell them more about my role as a member of the Care Team when we meet.  I confirmed their understanding of [place] location, the arrival and check-in procedure for Radiation Oncology.  She verbalized understanding and expressed appreciation for my call.  Gayleen Orem, RN, BSN, [place] at [place] [phone]

## 2014-10-29 ENCOUNTER — Telehealth: Payer: Self-pay | Admitting: Oncology

## 2014-10-29 NOTE — Telephone Encounter (Signed)
S/W PATIENT WIFE AND GAVE NP APPT FOR 01/07 @ 10:30 W/DR. SHADAD.  REFERRING DR. Francina Ames DX- PAROTID TUMOR

## 2014-10-30 ENCOUNTER — Ambulatory Visit
Admission: RE | Admit: 2014-10-30 | Discharge: 2014-10-30 | Disposition: A | Discharge status: Home / Self Care | Payer: Medicare Other | Source: Ambulatory Visit | Attending: Radiation Oncology | Admitting: Radiation Oncology

## 2014-10-30 ENCOUNTER — Encounter: Payer: Self-pay | Admitting: Radiation Oncology

## 2014-10-30 ENCOUNTER — Encounter: Payer: Self-pay | Admitting: *Deleted

## 2014-10-30 VITALS — BP 126/62 | HR 70 | Temp 98.9°F | Resp 20 | Ht 71.0 in | Wt 189.0 lb

## 2014-10-30 DIAGNOSIS — N400 Enlarged prostate without lower urinary tract symptoms: Secondary | ICD-10-CM | POA: Insufficient documentation

## 2014-10-30 DIAGNOSIS — I2510 Atherosclerotic heart disease of native coronary artery without angina pectoris: Secondary | ICD-10-CM | POA: Diagnosis not present

## 2014-10-30 DIAGNOSIS — F419 Anxiety disorder, unspecified: Secondary | ICD-10-CM | POA: Insufficient documentation

## 2014-10-30 DIAGNOSIS — Z951 Presence of aortocoronary bypass graft: Secondary | ICD-10-CM | POA: Insufficient documentation

## 2014-10-30 DIAGNOSIS — I10 Essential (primary) hypertension: Secondary | ICD-10-CM | POA: Diagnosis not present

## 2014-10-30 DIAGNOSIS — C07 Malignant neoplasm of parotid gland: Secondary | ICD-10-CM | POA: Insufficient documentation

## 2014-10-30 DIAGNOSIS — H409 Unspecified glaucoma: Secondary | ICD-10-CM | POA: Insufficient documentation

## 2014-10-30 DIAGNOSIS — I255 Ischemic cardiomyopathy: Secondary | ICD-10-CM | POA: Diagnosis not present

## 2014-10-30 DIAGNOSIS — Z79891 Long term (current) use of opiate analgesic: Secondary | ICD-10-CM | POA: Insufficient documentation

## 2014-10-30 DIAGNOSIS — Z87891 Personal history of nicotine dependence: Secondary | ICD-10-CM | POA: Insufficient documentation

## 2014-10-30 DIAGNOSIS — I4891 Unspecified atrial fibrillation: Secondary | ICD-10-CM | POA: Insufficient documentation

## 2014-10-30 DIAGNOSIS — Z7901 Long term (current) use of anticoagulants: Secondary | ICD-10-CM | POA: Insufficient documentation

## 2014-10-30 DIAGNOSIS — Z950 Presence of cardiac pacemaker: Secondary | ICD-10-CM | POA: Insufficient documentation

## 2014-10-30 DIAGNOSIS — E780 Pure hypercholesterolemia: Secondary | ICD-10-CM | POA: Insufficient documentation

## 2014-10-30 DIAGNOSIS — Z510 Encounter for antineoplastic radiation therapy: Secondary | ICD-10-CM | POA: Insufficient documentation

## 2014-10-30 DIAGNOSIS — F0390 Unspecified dementia without behavioral disturbance: Secondary | ICD-10-CM | POA: Insufficient documentation

## 2014-10-30 DIAGNOSIS — I429 Cardiomyopathy, unspecified: Secondary | ICD-10-CM | POA: Insufficient documentation

## 2014-10-30 HISTORY — DX: Malignant neoplasm of parotid gland: C07

## 2014-10-30 LAB — CBC WITH DIFFERENTIAL/PLATELET
BASO%: 0.6 % (ref 0.0–2.0)
Basophils Absolute: 0 10*3/uL (ref 0.0–0.1)
EOS ABS: 0.1 10*3/uL (ref 0.0–0.5)
EOS%: 1.4 % (ref 0.0–7.0)
HCT: 41.9 % (ref 38.4–49.9)
HEMOGLOBIN: 13.8 g/dL (ref 13.0–17.1)
LYMPH#: 1.5 10*3/uL (ref 0.9–3.3)
LYMPH%: 21.1 % (ref 14.0–49.0)
MCH: 31.2 pg (ref 27.2–33.4)
MCHC: 32.9 g/dL (ref 32.0–36.0)
MCV: 94.9 fL (ref 79.3–98.0)
MONO#: 0.5 10*3/uL (ref 0.1–0.9)
MONO%: 7.6 % (ref 0.0–14.0)
NEUT#: 4.9 10*3/uL (ref 1.5–6.5)
NEUT%: 69.3 % (ref 39.0–75.0)
Platelets: 159 10*3/uL (ref 140–400)
RBC: 4.41 10*6/uL (ref 4.20–5.82)
RDW: 13.7 % (ref 11.0–14.6)
WBC: 7.1 10*3/uL (ref 4.0–10.3)

## 2014-10-30 LAB — COMPREHENSIVE METABOLIC PANEL (CC13)
ALBUMIN: 3.5 g/dL (ref 3.5–5.0)
ALK PHOS: 85 U/L (ref 40–150)
ALT: 14 U/L (ref 0–55)
AST: 20 U/L (ref 5–34)
Anion Gap: 10 mEq/L (ref 3–11)
BUN: 13.4 mg/dL (ref 7.0–26.0)
CALCIUM: 10.2 mg/dL (ref 8.4–10.4)
CHLORIDE: 102 meq/L (ref 98–109)
CO2: 29 mEq/L (ref 22–29)
Creatinine: 1.2 mg/dL (ref 0.7–1.3)
EGFR: 59 mL/min/{1.73_m2} — ABNORMAL LOW (ref >90)
GLUCOSE: 114 mg/dL (ref 70–140)
POTASSIUM: 4.3 meq/L (ref 3.5–5.1)
SODIUM: 142 meq/L (ref 136–145)
TOTAL PROTEIN: 6.9 g/dL (ref 6.4–8.3)
Total Bilirubin: 0.62 mg/dL (ref 0.20–1.20)

## 2014-10-30 NOTE — Progress Notes (Signed)
Please see the Nurse Progress Note in the MD Initial Consult Encounter for this patient. 

## 2014-10-30 NOTE — Progress Notes (Signed)
[facility] Psychosocial Distress Screening Clinical Social Work  Clinical Social Work was referred by distress screening protocol.  The patient scored a 9 on the Psychosocial Distress Thermometer which indicates severe distress. Clinical Social Worker met with pt and his wife to assess for distress and other psychosocial needs. Pt has dementia and wife filled out form. CSW reviewed distress screen with both of them. Transportation is an issue and CSW reviewed resources to assist. CSW also provided pt and wife with Support Team hand out and reviewed resources available at the [facility]. Pt and wife have CNA services three times a week to assist with Pt's bathing and they are paying for this service. CSW to make referral to [facility] for help with transportation. They were provided with how to contact the Support Team for additional support.   ONCBCN DISTRESS SCREENING 10/30/2014  Screening Type Initial Screening  Distress experienced in past week (1-10) 9  Emotional problem type Depression;Adjusting to illness;Feeling hopeless;Boredom;Adjusting to appearance changes  Spiritual/Religous concerns type Relating to God     Clinical Social Worker follow up needed: Yes.    If yes, follow up plan:  See above Loren Racer, [place] Worker [place]  [facility] Phone: [phone] Fax: [phone]

## 2014-10-30 NOTE — Progress Notes (Signed)
Met with patient and his wife during initial consult with Dr. Sondra Come. 1. Introduced myself as their Navigator, explained my role as a member of the Care Team, provided contact information, encouraged them to contact me with questions/concerns as treatments/procedures begin. 2. Provided New Patient Information packet:  Contact information for physician and navigator  Advance Directive information ([place] blue pamphlet)  Fall Prevention Patient Safety Plan  [facility] campus map with highlight of [place] 3. Provided introductory explanation of radiation treatment including SIM planning and fitting of head mask, showed them example of mask.   4. Provided photos/diagrams of feeding tube, explained purpose. 5. Provided a tour of SIM and [place] areas, explained treatment and arrival procedures.   6. Escorted to Registration for lab draw. They verbalized understanding of information provided.    Gayleen Orem, RN, BSN, [place] at [place] [phone]

## 2014-10-30 NOTE — Progress Notes (Signed)
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 male who is seen out courtesy of Dr. Nicolette Bang for an opinion concerning radiation therapy as part of management of what appears to be advanced parotid carcinoma. Earlier this year the patient presented with some discomfort and swelling in the right parotid region as well as some ear problems. He also developed otalgia and some bloody right ear drainage. Patient was treated with antibiotics with minimal improvement. A subsequent neck CT scan showed what appeared to be a malignant mass originating in the right parotid region with lymphatic spread into the right neck and supraclavicular region. Patient was referred to Dr. Nicolette Bang. A subsequent PET/CT scan confirmed bilateral lymphadenopathy and possibly lung metastasis. Given the patient's age and medical issues he was not felt to be a good candidate for surgical intervention and the patient did not wish to have surgery. He is now seen in radiation oncology for evaluation and consideration for treatment. He is also been scheduled for medical oncology evaluation in early January.  PREVIOUS RADIATION THERAPY: No  PAST MEDICAL HISTORY:  has a past medical history of Obstructive sleep apnea; Ischemic cardiomyopathy; Atrial fibrillation; AV block, complete s/p ablation; Pacemaker [place]; Cerebrovascular disease; Hypercholesterolemia; Hiatal hernia; Gastritis; Diverticulosis; Colonic polyp; BPH (benign prostatic hypertrophy); DJD (degenerative joint disease); Memory loss; Anxiety; Anemia; Shingles; Carotid artery occlusion;  Hypertension; Pneumonia; Parkinsonian features; CAD (coronary artery disease) (2000); Glaucoma; and Cancer of parotid gland.    PAST SURGICAL HISTORY: Past Surgical History  Procedure Laterality Date  . Pacemaker insertion    . Coronary artery bypass graft  07/1999    x4 by Dr. Jobie Quaker 9/00  . Carotid endarterectomy Right 08/2003    right 10/04 Dr. Scot Dock  . Anal fissure repair    . Hiatal hernia repair      incarcerated stomach with takedown and repair of hiatus and gastropexy  . Insert / replace / remove pacemaker    . Endarterectomy Left 06/27/2013    Procedure: Left Carotid Endarterectomy with Finesse patch angioplasty;  Surgeon: Angelia Mould, MD;  Location: [facility] OR;  Service: Vascular;  Laterality: Left;  . Hernia repair      FAMILY HISTORY: family history includes Coronary artery disease in his other.  SOCIAL HISTORY:  reports that he quit smoking about 18 years ago. His smoking use included Cigarettes. He has a 75 pack-year smoking history. He has never used smokeless tobacco. He reports that he does not drink alcohol or use illicit drugs.  ALLERGIES: Review of patient's allergies indicates no known allergies.  MEDICATIONS:  Current Outpatient Prescriptions  Medication Sig Dispense Refill  . acetaminophen (TYLENOL) 500 MG tablet Take 500 mg by mouth every 6 (six) hours as needed for pain.     . brimonidine-timolol (COMBIGAN) 0.2-0.5 % ophthalmic solution Place 1 drop into both eyes every 12 (twelve) hours.    Marland Kitchen dextromethorphan-guaiFENesin (MUCINEX DM) 30-600 MG per 12 hr tablet Take 1 tablet by mouth 2 (two) times daily as needed.     . docusate sodium (COLACE) 100 MG capsule Take 1 capsule (100 mg total) by mouth every 12 (twelve) hours. [place]  capsule 0  . donepezil (ARICEPT) 10 MG tablet Take 1 tablet (10 mg total) by mouth every morning. 30 tablet 5  . finasteride (PROSCAR) 5 MG tablet Take 5 mg by mouth every morning.     . latanoprost (XALATAN) 0.005 % ophthalmic  solution Place 1 drop into both eyes daily.     Marland Kitchen lisinopril (PRINIVIL,ZESTRIL) 20 MG tablet TAKE 1 TABLET (20 MG TOTAL) BY MOUTH DAILY. 30 tablet 5  . memantine (NAMENDA) 10 MG tablet Take 1 tablet (10 mg total) by mouth 2 (two) times daily. 180 tablet 3  . QUEtiapine (SEROQUEL) 50 MG tablet TAKE TWO TABLETS BY MOUTH DAILY 60 tablet 3  . simvastatin (ZOCOR) 40 MG tablet TAKE ONE TABLET BY MOUTH AT BEDTIME 30 tablet 5  . timolol (BETIMOL) 0.5 % ophthalmic solution     . traMADol (ULTRAM) 50 MG tablet TAKE ONE TABLET BY MOUTH THREE TIMES DAILY AS NEEDED 90 tablet 0  . warfarin (COUMADIN) 5 MG tablet TAKE AS INSTRUCTED BY ANTICOAGULATION CLINIC 35 tablet 1  . HYDROcodone-acetaminophen (NORCO/VICODIN) 5-325 MG per tablet      No current facility-administered medications for this encounter.    REVIEW OF SYSTEMS:  A 15 point review of systems is documented in the electronic medical record. This was obtained by the nursing staff. However, I reviewed this with the patient to discuss relevant findings and make appropriate changes.  Decreased hearing out of the right ear.  The patient surprisingly has little pain in his right face and ear area. He has noticed progressive problems with eating related to his right facial paralysis. He does drool. He denies any irritation or dryness to his right eye. The patient denies any cough or breathing problems. He denies any hemoptysis. He denies any new areas of bony pain. He does complain of pain with palpation along the inner table of the right mandible. This appears to be related to his denture placement.   PHYSICAL EXAM:  height is 5\' 11"  (1.803 m) and weight is 189 lb (85.73 kg). His oral temperature is 98.9 F (37.2 C). His blood pressure is 126/62 and his pulse is 70. His respiration is 20 and oxygen saturation is 97%.   BP 126/62 mmHg  Pulse 70  Temp(Src) 98.9 F (37.2 C) (Oral)  Resp 20  Ht 5\' 11"  (1.803 m)  Wt 189 lb (85.73 kg)  BMI 26.37 kg/m2  SpO2  97%  General Appearance:    Alert, cooperative, no distress, appears stated age, accompanied by wife on evaluation today   Head:    Normocephalic, without obvious abnormality, atraumatic  Eyes:    PERRL, conjunctiva/corneas clear, EOM's intact, right eyelid droops        Ears:    left ear normal with benign tympanic membrane. The right ear shows the external canal to be closed secondary to swelling and tumor   Nose:   Nares normal, septum midline, mucosa normal, no drainage    or sinus tenderness  Throat:   Lips, mucosa, and tongue normal; dentures in place which were removed for the examination,  gums normal  Neck:   Supple, symmetrical, trachea midline, fixed firm mass in the right parotid region extending into the right upper neck. This extends over approximately 8-9 cm. There is erythema and darkening of the skin consistent with tumor infiltration but no drainage at this time.  the patient also has swelling behind his right ear extending over approximately 4-5 cm, no obvious palpable adenopathy in the right  lower neck,  supraclavicular or left neck area      Back:     Symmetric, no curvature, ROM normal, no CVA tenderness  Lungs:     Clear to auscultation bilaterally, respirations unlabored  Chest wall:    No tenderness or deformity, pacemaker in place in the left upper chest   Heart:    Regular rate and rhythm, S1 and S2 normal, no murmur, rub   or gallop  Abdomen:     Soft, non-tender, bowel sounds active all four quadrants,    no masses, no organomegaly        Extremities:   Extremities normal, atraumatic, no cyanosis or edema  Pulses:   2+ and symmetric all extremities  Skin:   Skin color, texture, turgor normal, no rashes or lesions  Lymph nodes:   supraclavicular, and axillary nodes normal  Neurologic:    complete right facial nerve paralysis,  motor strength in the proximal and distal muscle groups of the upper lower extremities is 5 out of 5       ECOG = 2  2 - Symptomatic,  <50% in bed during the day (Ambulatory and capable of all self care but unable to carry out any work activities. Up and about more than 50% of waking hours)  LABORATORY DATA:  Lab Results  Component Value Date   WBC 7.1 10/30/2014   HGB 13.8 10/30/2014   HCT 41.9 10/30/2014   MCV 94.9 10/30/2014   PLT 159 10/30/2014   NEUTROABS 4.9 10/30/2014   Lab Results  Component Value Date   NA 142 10/30/2014   K 4.3 10/30/2014   CL 101 12/19/2013   CO2 29 10/30/2014   GLUCOSE 114 10/30/2014   CREATININE 1.2 10/30/2014   CALCIUM 10.2 10/30/2014      RADIOGRAPHY: No results found.    IMPRESSION: Stage IV invasive poorly differentiated carcinoma presenting in the right parotid gland. Given the extent of disease he would not be a curative candidate with radiation or radiation and radiosensitizing chemotherapy alone. As above the patient is not a good candidate for surgery and he does not wish to consider aggressive surgery in his management. The patient would be a good candidate for an aggressive palliative course of radiation therapy and potentially radiosensitizing chemotherapy. The patient will be seen by medical oncology in the next several days for further evaluation. I discussed aggressive palliative radiation therapy with the patient and his wife versus consideration for hospice. I do anticipate significant difficulty with the patient completing radiation therapy given his performance status and anticipated side effects with this treatment. AtThis time the patient does wish to proceed with radiation rather than hospice evaluation.  PLAN: Simulation and planning first week in January. Anticipate ~ 6 weeks of radiation therapy depending on the patient's overall tolerance to therapy. I did discuss placement of a gastrostomy feeding tube at this time but at this point the patient does not wish to proceed with this procedure. He is willing to proceed with dental evaluation and will be set up with Dr.  Enrique Sack in the near future. Patient does complain of pain along the right inner mandible area which may be related to his dentures. I spent 60 minutes minutes face to face with the patient and more than 50% of that time was spent in counseling and/or coordination of care.   ------------------------------------------------  Blair Promise, PhD, MD

## 2014-11-03 ENCOUNTER — Encounter: Payer: Self-pay | Admitting: Internal Medicine

## 2014-11-03 DIAGNOSIS — I442 Atrioventricular block, complete: Secondary | ICD-10-CM

## 2014-11-06 ENCOUNTER — Other Ambulatory Visit: Payer: Self-pay | Admitting: Neurology

## 2014-11-06 ENCOUNTER — Other Ambulatory Visit: Payer: Self-pay | Admitting: Internal Medicine

## 2014-11-11 ENCOUNTER — Ambulatory Visit
Admission: RE | Admit: 2014-11-11 | Discharge: 2014-11-11 | Disposition: A | Discharge status: Home / Self Care | Payer: Medicare Other | Source: Ambulatory Visit | Attending: Radiation Oncology | Admitting: Radiation Oncology

## 2014-11-11 ENCOUNTER — Telehealth: Payer: Self-pay | Admitting: Oncology

## 2014-11-11 ENCOUNTER — Encounter: Payer: Self-pay | Admitting: *Deleted

## 2014-11-11 VITALS — BP 132/52 | HR 69 | Temp 97.8°F | Resp 20 | Ht 71.0 in | Wt 189.0 lb

## 2014-11-11 DIAGNOSIS — I2510 Atherosclerotic heart disease of native coronary artery without angina pectoris: Secondary | ICD-10-CM | POA: Diagnosis not present

## 2014-11-11 DIAGNOSIS — Z510 Encounter for antineoplastic radiation therapy: Secondary | ICD-10-CM | POA: Diagnosis not present

## 2014-11-11 DIAGNOSIS — Z950 Presence of cardiac pacemaker: Secondary | ICD-10-CM | POA: Diagnosis not present

## 2014-11-11 DIAGNOSIS — N400 Enlarged prostate without lower urinary tract symptoms: Secondary | ICD-10-CM | POA: Diagnosis not present

## 2014-11-11 DIAGNOSIS — C07 Malignant neoplasm of parotid gland: Secondary | ICD-10-CM

## 2014-11-11 DIAGNOSIS — I10 Essential (primary) hypertension: Secondary | ICD-10-CM | POA: Diagnosis not present

## 2014-11-11 DIAGNOSIS — Z951 Presence of aortocoronary bypass graft: Secondary | ICD-10-CM | POA: Diagnosis not present

## 2014-11-11 DIAGNOSIS — F419 Anxiety disorder, unspecified: Secondary | ICD-10-CM | POA: Diagnosis not present

## 2014-11-11 DIAGNOSIS — Z87891 Personal history of nicotine dependence: Secondary | ICD-10-CM | POA: Diagnosis not present

## 2014-11-11 DIAGNOSIS — I4891 Unspecified atrial fibrillation: Secondary | ICD-10-CM | POA: Diagnosis not present

## 2014-11-11 DIAGNOSIS — Z7901 Long term (current) use of anticoagulants: Secondary | ICD-10-CM | POA: Diagnosis not present

## 2014-11-11 DIAGNOSIS — I255 Ischemic cardiomyopathy: Secondary | ICD-10-CM | POA: Diagnosis not present

## 2014-11-11 DIAGNOSIS — E780 Pure hypercholesterolemia: Secondary | ICD-10-CM | POA: Diagnosis not present

## 2014-11-11 DIAGNOSIS — H409 Unspecified glaucoma: Secondary | ICD-10-CM | POA: Diagnosis not present

## 2014-11-11 DIAGNOSIS — Z79891 Long term (current) use of opiate analgesic: Secondary | ICD-10-CM | POA: Diagnosis not present

## 2014-11-11 MED ORDER — SODIUM CHLORIDE 0.9 % IJ SOLN
10.0000 mL | Freq: Once | INTRAMUSCULAR | Status: AC
  Administered 2014-11-11: 10 mL via INTRAVENOUS

## 2014-11-11 NOTE — Telephone Encounter (Signed)
Left a message for the devices area at [place] regarding Ricky [name] pacemaker form.  Requested a return call.

## 2014-11-11 NOTE — Progress Notes (Signed)
To provide support and encouragement, care continuity and to assess for needs, met with patient and his wife during CT SIM, after which: 1. Showed them [place] tmt area, explained arrival and preparation procedure, explained tmt procedure.  They verbalized understanding.   2. Provided an Epic appt calendar for clarification of appts.  3. Took patient to [employer], helped with transfer to car (he had been in wheelchair since IV start b/c unsteady on feet). They understand they can contact me with questions/concerns.  Gayleen Orem, RN, BSN, [facility] at [place] [phone]

## 2014-11-11 NOTE — Progress Notes (Signed)
Ricky [name]  here for an IV start for CT Sim.  He does not have any allergies and denies problems with IV contrast dye.  He is not diabetic.  BUN of 13.4, creat of 1.2 from 10/30/14.  #22 gauge IV inserted into his left AC.  Blood return noted.  Covered with tegaderm and tape.  He was escorted to CT SIM in a wheelchair by Gayleen Orem, Head and Neck Navigator.

## 2014-11-12 ENCOUNTER — Ambulatory Visit: Payer: Medicare Other | Admitting: Hematology

## 2014-11-12 ENCOUNTER — Ambulatory Visit: Payer: Medicare Other

## 2014-11-12 ENCOUNTER — Other Ambulatory Visit: Payer: Self-pay | Admitting: Internal Medicine

## 2014-11-12 NOTE — Progress Notes (Signed)
  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  Identity was confirmed.  All relevant records and images related to the planned course of therapy were reviewed.  The patient freely provided informed written consent to proceed with treatment after reviewing the details related to the planned course of therapy. The consent form was witnessed and verified by the simulation staff.  Then, the patient was set-up in a stable reproducible  supine position for radiation therapy.  CT images were obtained.  Surface markings were placed.  The CT images were loaded into the planning software.  Then the target and avoidance structures were contoured.  Treatment planning then occurred.  The radiation prescription was entered and confirmed.  Then, I designed and supervised the construction of a total of 1 medically necessary complex treatment devices.  I have requested : Intensity Modulated Radiotherapy (IMRT) is medically necessary for this case for the following reason:  Parotid sparing..  I have ordered:dose calc.  PLAN:  The patient will receive 60 Gy in 30 fractions.  ________________________________  -----------------------------------  Blair Promise, PhD, MD

## 2014-11-13 ENCOUNTER — Encounter: Payer: Self-pay | Admitting: Nutrition

## 2014-11-13 ENCOUNTER — Ambulatory Visit (HOSPITAL_BASED_OUTPATIENT_CLINIC_OR_DEPARTMENT_OTHER): Payer: Medicare Other

## 2014-11-13 ENCOUNTER — Encounter: Payer: Medicare Other | Admitting: Nutrition

## 2014-11-13 ENCOUNTER — Ambulatory Visit (HOSPITAL_BASED_OUTPATIENT_CLINIC_OR_DEPARTMENT_OTHER): Payer: Medicare Other | Admitting: Oncology

## 2014-11-13 ENCOUNTER — Telehealth: Payer: Self-pay | Admitting: Oncology

## 2014-11-13 ENCOUNTER — Encounter: Payer: Self-pay | Admitting: *Deleted

## 2014-11-13 ENCOUNTER — Other Ambulatory Visit: Payer: Medicare Other

## 2014-11-13 ENCOUNTER — Encounter: Payer: Self-pay | Admitting: Oncology

## 2014-11-13 VITALS — BP 132/53 | HR 78 | Temp 98.2°F | Resp 19 | Ht 71.0 in | Wt 187.9 lb

## 2014-11-13 DIAGNOSIS — C07 Malignant neoplasm of parotid gland: Secondary | ICD-10-CM | POA: Diagnosis not present

## 2014-11-13 DIAGNOSIS — N400 Enlarged prostate without lower urinary tract symptoms: Secondary | ICD-10-CM | POA: Diagnosis not present

## 2014-11-13 DIAGNOSIS — Z87891 Personal history of nicotine dependence: Secondary | ICD-10-CM | POA: Diagnosis not present

## 2014-11-13 DIAGNOSIS — Z79891 Long term (current) use of opiate analgesic: Secondary | ICD-10-CM | POA: Diagnosis not present

## 2014-11-13 DIAGNOSIS — I255 Ischemic cardiomyopathy: Secondary | ICD-10-CM | POA: Diagnosis not present

## 2014-11-13 DIAGNOSIS — Z510 Encounter for antineoplastic radiation therapy: Secondary | ICD-10-CM | POA: Diagnosis not present

## 2014-11-13 DIAGNOSIS — Z951 Presence of aortocoronary bypass graft: Secondary | ICD-10-CM | POA: Diagnosis not present

## 2014-11-13 DIAGNOSIS — I4891 Unspecified atrial fibrillation: Secondary | ICD-10-CM | POA: Diagnosis not present

## 2014-11-13 DIAGNOSIS — E780 Pure hypercholesterolemia: Secondary | ICD-10-CM | POA: Diagnosis not present

## 2014-11-13 DIAGNOSIS — Z950 Presence of cardiac pacemaker: Secondary | ICD-10-CM | POA: Diagnosis not present

## 2014-11-13 DIAGNOSIS — Z7901 Long term (current) use of anticoagulants: Secondary | ICD-10-CM | POA: Diagnosis not present

## 2014-11-13 DIAGNOSIS — I2510 Atherosclerotic heart disease of native coronary artery without angina pectoris: Secondary | ICD-10-CM | POA: Diagnosis not present

## 2014-11-13 DIAGNOSIS — F419 Anxiety disorder, unspecified: Secondary | ICD-10-CM | POA: Diagnosis not present

## 2014-11-13 DIAGNOSIS — I10 Essential (primary) hypertension: Secondary | ICD-10-CM | POA: Diagnosis not present

## 2014-11-13 DIAGNOSIS — H409 Unspecified glaucoma: Secondary | ICD-10-CM | POA: Diagnosis not present

## 2014-11-13 NOTE — Progress Notes (Signed)
Patient did not show up for nutrition appointment. 

## 2014-11-13 NOTE — Progress Notes (Signed)
Checked in new patient with no issues prior to seeing the dr. He has appt card and has not been traveling.

## 2014-11-13 NOTE — Progress Notes (Signed)
Please see consult note.  

## 2014-11-13 NOTE — Consult Note (Signed)
Reason for Referral: Parotid gland tumor.   HPI: 81 year old gentleman with multiple comorbid conditions including cardiomyopathy, atrial fibrillation among others and no recent diagnosis of parotid gland tumor. He was in his normal state of health until he presented with a right-sided tumor that has been growing for at least the last 6 months. He subsequently started developing her pain and bloody otorrhea and subsequently was evaluated in the emergency department including CT of the neck. That showed what appeared to be a malignant mass originating in the right parotid region with lymphatic spread into the right neck and supraclavicular region. Patient was referred to Dr. Nicolette Bang. A subsequent PET/CT scan confirmed bilateral lymphadenopathy and possibly lung metastasis. Given the patient's age and medical issues he was not felt to be a good candidate for surgical intervention and the patient did not wish to have surgery. He is now seen in radiation oncology for evaluation and consideration for treatment. He was evaluated by Dr. Sondra Come and plan to start palliative radiation therapy in the near future. I was asked to evaluate the role of chemotherapy for this tumor. Clinically, patient report some symptoms associated with this tumor. Although he is not having any pain he have reported some decline in his appetite. He has close to 20 pound weight loss. He does report bleeding that have subsided at this time. His mobility is very limited and he is virtually chair bound at this time. He does not report any headaches, blurry vision, syncope or seizures. He does not report any fevers, chills, sweats but does report decline in his performance status. He does not report any chest pain, shortness of breath, cough, hemoptysis or leg edema. He does not report any cough, wheezing or hemoptysis. He does not report any nausea, vomiting, abdominal pain or satiety. He does not report any dysphagia without odynophagia or  regurgitation. He does not report any frequency, urgency or hematuria. Rest of his review of systems unremarkable.   Past Medical History  Diagnosis Date  . Obstructive sleep apnea     noncompliant with CPAP  . Ischemic cardiomyopathy   . Atrial fibrillation     on anticoag  . AV block, complete s/p ablation   . Pacemaker [place]   . Cerebrovascular disease   . Hypercholesterolemia   . Hiatal hernia   . Gastritis   . Diverticulosis   . Colonic polyp   . BPH (benign prostatic hypertrophy)   . DJD (degenerative joint disease)   . Memory loss   . Anxiety   . Anemia   . Shingles   . Carotid artery occlusion     s/p R CEA 08/2003, s/p L CEA 06/2013  . Hypertension   . Pneumonia     hx of  . Parkinsonian features   . CAD (coronary artery disease) 2000    s/p CABG x 4 07/1999  . Glaucoma   . Cancer of parotid gland     right  :  Past Surgical History  Procedure Laterality Date  . Pacemaker insertion    . Coronary artery bypass graft  07/1999    x4 by Dr. Jobie Quaker 9/00  . Carotid endarterectomy Right 08/2003    right 10/04 Dr. Scot Dock  . Anal fissure repair    . Hiatal hernia repair      incarcerated stomach with takedown and repair of hiatus and gastropexy  . Insert / replace / remove pacemaker    . Endarterectomy Left 06/27/2013    Procedure: Left Carotid Endarterectomy  with Finesse patch angioplasty;  Surgeon: Angelia Mould, MD;  Location: [place];  Service: Vascular;  Laterality: Left;  . Hernia repair    :  Current Outpatient Prescriptions  Medication Sig Dispense Refill  . acetaminophen (TYLENOL) 500 MG tablet Take 500 mg by mouth every 6 (six) hours as needed for pain.     . brimonidine-timolol (COMBIGAN) 0.2-0.5 % ophthalmic solution Place 1 drop into both eyes every 12 (twelve) hours.    Marland Kitchen dextromethorphan-guaiFENesin (MUCINEX DM) 30-600 MG per 12 hr tablet Take 1 tablet by mouth 2 (two) times daily as needed.     . docusate sodium (COLACE) 100 MG capsule  Take 1 capsule (100 mg total) by mouth every 12 (twelve) hours. 60 capsule 0  . donepezil (ARICEPT) 10 MG tablet TAKE  ONE TABLET BY MOUTH EVERY MORNING 30 tablet 5  . finasteride (PROSCAR) 5 MG tablet Take 5 mg by mouth every morning.     Marland Kitchen HYDROcodone-acetaminophen (NORCO/VICODIN) 5-325 MG per tablet     . latanoprost (XALATAN) 0.005 % ophthalmic solution Place 1 drop into both eyes daily.     Marland Kitchen lisinopril (PRINIVIL,ZESTRIL) 20 MG tablet TAKE 1 TABLET (20 MG TOTAL) BY MOUTH DAILY. 30 tablet 5  . memantine (NAMENDA) 10 MG tablet Take 1 tablet (10 mg total) by mouth 2 (two) times daily. 180 tablet 3  . QUEtiapine (SEROQUEL) 50 MG tablet TAKE TWO TABLETS BY MOUTH DAILY 60 tablet 3  . simvastatin (ZOCOR) 40 MG tablet TAKE ONE TABLET BY MOUTH AT BEDTIME 30 tablet 5  . timolol (BETIMOL) 0.5 % ophthalmic solution     . traMADol (ULTRAM) 50 MG tablet TAKE ONE TABLET BY MOUTH THREE TIMES DAILY AS NEEDED 90 tablet 0  . warfarin (COUMADIN) 5 MG tablet TAKE AS INSTRUCTED BY ANTICOAGULATION CLINIC 35 tablet 1   No current facility-administered medications for this visit.     :  No Known Allergies:  Family History  Problem Relation Age of Onset  . Coronary artery disease Other   :  History   Social History  . Marital Status: Married    Spouse Name: arrie    Number of Children: 1  . Years of Education: N/A   Occupational History  . retired   .     Social History Main Topics  . Smoking status: Former Smoker -- 1.50 packs/day for 50 years    Types: Cigarettes    Quit date: 11/08/1995  . Smokeless tobacco: Never Used  . Alcohol Use: No     Comment: quit drinking back in 1977.  . Drug Use: No  . Sexual Activity: Not Currently   Other Topics Concern  . Not on file   Social History Narrative   Married, wife Bonnita Levan, 7yrs   1 daughter from 1st marriage who lives nearby   Son died 04-30-10 in tractor accident.  :  Pertinent items are noted in HPI.  Exam: ECOG 2 Blood pressure  132/53, pulse 78, temperature 98.2 F (36.8 C), temperature source Oral, resp. rate 19, height 5\' 11"  (1.803 m), weight 187 lb 14.4 oz (85.231 kg), SpO2 100 %. General appearance: alert and cooperative Head: Normocephalic, without obvious abnormality. A large tumor noted along the right parotid area. Some dried blood noted behind his right ear. Throat: lips, mucosa, and tongue normal; teeth and gums normal Neck: Palpable adenopathy on the right cervical and submandibular area. Back: negative Resp: Expiratory wheezes noted in all lung fields. Chest wall: no tenderness Cardio: regular  rate and rhythm, S1, S2 normal, no murmur, click, rub or gallop GI: soft, non-tender; bowel sounds normal; no masses,  no organomegaly Extremities: extremities normal, atraumatic, no cyanosis or edema Skin: Skin color, texture, turgor normal. No rashes or lesions Lymph nodes: Cervical, supraclavicular, and axillary nodes normal.  CBC    Component Value Date/Time   WBC 7.1 10/30/2014 1228   WBC 7.8 12/19/2013 1240   RBC 4.41 10/30/2014 1228   RBC 4.21* 12/19/2013 1240   HGB 13.8 10/30/2014 1228   HGB 13.7 12/19/2013 1240   HCT 41.9 10/30/2014 1228   HCT 39.9 12/19/2013 1240   PLT 159 10/30/2014 1228   PLT 160 12/19/2013 1240   MCV 94.9 10/30/2014 1228   MCV 94.8 12/19/2013 1240   MCH 31.2 10/30/2014 1228   MCH 32.5 12/19/2013 1240   MCHC 32.9 10/30/2014 1228   MCHC 34.3 12/19/2013 1240   RDW 13.7 10/30/2014 1228   RDW 13.1 12/19/2013 1240   LYMPHSABS 1.5 10/30/2014 1228   LYMPHSABS 2.1 04/25/2013 1027   MONOABS 0.5 10/30/2014 1228   MONOABS 0.5 04/25/2013 1027   EOSABS 0.1 10/30/2014 1228   EOSABS 0.1 04/25/2013 1027   BASOSABS 0.0 10/30/2014 1228   BASOSABS 0.0 04/25/2013 1027      Chemistry      Component Value Date/Time   NA 142 10/30/2014 1229   NA 141 12/19/2013 1240   K 4.3 10/30/2014 1229   K 4.2 12/19/2013 1240   CL 101 12/19/2013 1240   CO2 29 10/30/2014 1229   CO2 27  12/19/2013 1240   BUN 13.4 10/30/2014 1229   BUN 20 12/19/2013 1240   CREATININE 1.2 10/30/2014 1229   CREATININE 1.19 12/19/2013 1240      Component Value Date/Time   CALCIUM 10.2 10/30/2014 1229   CALCIUM 9.9 12/19/2013 1240   ALKPHOS 85 10/30/2014 1229   ALKPHOS 82 06/25/2013 1221   AST 20 10/30/2014 1229   AST 21 06/25/2013 1221   ALT 14 10/30/2014 1229   ALT 14 06/25/2013 1221   BILITOT 0.62 10/30/2014 1229   BILITOT 0.5 06/25/2013 1221       Assessment and Plan:   81 year old gentleman with poorly differentiated carcinoma of the parotid gland presented with neck mass and diffuse adenopathy. Staging PET/CT scan in December 2015 showed no distant metastasis but a hypermetabolic mass measuring 4.3 x 7.6 cm in the right parotid gland and into the right parapharyngeal space. There is hypermetabolic lymph node involvement in the right neck involving right cervical node as well as left cervical lymphadenopathy. There is also hypermetabolic bilateral supraclavicular lymph nodes.  The natural course of this disease was discussed extensively with the patient and his wife. Given the aggressive nature of this cancer curative treatment at this time are extremely unlikely especially in the setting of him refusing surgery. Palliative radiation therapy is a reasonable option an attempt to reduce the cancer burden and associated symptoms. The role of adding systemic chemotherapy to radiation therapy was discussed extensively. I feel that he is a poor candidate for platinum chemotherapy which would be the ideal regimen to be added to radiation therapy. The risks associated with platinum concomitantly with radiation really outweighs any potential palliative risk especially in this gentleman. Abdomen: Therapy is palliation, radiation should be adequate to doing that without the risk associated with chemotherapy.  Overall I feel his prognosis is very poor given the aggressive nature of this cancer as  well as his poor performance status. I think  he would be a reasonable candidate for hospice involvement after completing radiation therapy.  All his questions are answered today to her satisfaction.

## 2014-11-13 NOTE — Telephone Encounter (Signed)
gv adn printed appt sched and avs for pt for March.... °

## 2014-11-13 NOTE — Progress Notes (Signed)
To provide support and encouragement, care continuity and to assess for needs, met with patient and his wife during [place] Consult appt with Dr. Alen Blew. They verbalized Dr. Hazeline Junker explanation why he thinks chemo is not an appropriate tmt at this time. I reiterated that I would be joining them when he starts RT next week. They understand I can be contacted with questions/concerns.  Gayleen Orem, RN, BSN, [place] at [place] [phone]

## 2014-11-14 ENCOUNTER — Telehealth: Payer: Self-pay | Admitting: Oncology

## 2014-11-14 ENCOUNTER — Encounter (HOSPITAL_COMMUNITY): Payer: Self-pay | Admitting: Dentistry

## 2014-11-14 ENCOUNTER — Ambulatory Visit (HOSPITAL_COMMUNITY): Payer: Self-pay | Admitting: Dentistry

## 2014-11-14 ENCOUNTER — Encounter (INDEPENDENT_AMBULATORY_CARE_PROVIDER_SITE_OTHER): Payer: Self-pay

## 2014-11-14 VITALS — BP 119/61 | HR 70 | Temp 97.6°F

## 2014-11-14 DIAGNOSIS — K0820 Unspecified atrophy of edentulous alveolar ridge: Secondary | ICD-10-CM

## 2014-11-14 DIAGNOSIS — C07 Malignant neoplasm of parotid gland: Secondary | ICD-10-CM | POA: Diagnosis not present

## 2014-11-14 DIAGNOSIS — K0889 Other specified disorders of teeth and supporting structures: Secondary | ICD-10-CM

## 2014-11-14 DIAGNOSIS — K08109 Complete loss of teeth, unspecified cause, unspecified class: Secondary | ICD-10-CM

## 2014-11-14 DIAGNOSIS — Z01818 Encounter for other preprocedural examination: Secondary | ICD-10-CM | POA: Diagnosis not present

## 2014-11-14 DIAGNOSIS — Z972 Presence of dental prosthetic device (complete) (partial): Secondary | ICD-10-CM

## 2014-11-14 DIAGNOSIS — Z0189 Encounter for other specified special examinations: Secondary | ICD-10-CM

## 2014-11-14 NOTE — Patient Instructions (Signed)

## 2014-11-14 NOTE — Telephone Encounter (Signed)
Called Windle Guard, Psychologist, educational for [place] regarding Mr. [place] pacemaker.  Aaron Edelman said he will be here at 10:30 on 11/20/14 to monitor and interrogate Mr. Cajamarca pacemaker during and after treatment.  He will also call on 12/30/14 to set up a time to check his pacemaker after treatment is finished.

## 2014-11-14 NOTE — Progress Notes (Signed)
DENTAL CONSULTATION  Date of Consultation:  11/14/2014 Patient Name:   Ricky [name] Date of Birth:   1931-05-03 Medical Record Number: [id number]  VITALS: BP 119/61 mmHg  Pulse 70  Temp(Src) 97.6 F (36.4 C) (Oral)  CHIEF COMPLAINT: Patient was referred by Dr. Gery Pray for a preradiation therapy dental consultation.  HPI: BURNIS HALLING is an 81 year old male recently diagnosed with right parotid cancer. Patient with anticipated radiation therapy with Dr. Sondra Come. Patient is now seen as part of a medically necessary preradiation therapy dental protocol examination.  The patient is edentulous. Patient has been wearing upper and lower complete denture for 40-50 years by report. Patient indicates that this set of dentures was fabricated approximately 42 years ago. The patient indicates that the dentures were fabricated in [place], [place]. Patient indicates that the dentures "fit good" and denies having any denture problems at this time.  PROBLEM LIST: Patient Active Problem List   Diagnosis Date Noted  . Hyperglycemia 04/07/2014  . Aftercare following surgery of the circulatory system, [place] 02/06/2014  . Encounter for therapeutic drug monitoring 02/05/2014  . Parkinsonian features 09/18/2013  . Chronic venous insufficiency 04/30/2013  . Dementia in conditions classified elsewhere with behavioral disturbance(294.11) 03/13/2013  . Occlusion and stenosis of carotid artery without mention of cerebral infarction 11/21/2012  . Hypertension 08/31/2012  . Pacemaker-single-chamber [employer] 08/31/2011  . Coronary artery disease  s/p CABG EF 55% 2011 06/27/2011  . Long term current use of anticoagulant 12/20/2010  . BENIGN PROSTATIC HYPERTROPHY, WITH OBSTRUCTION 08/14/2009  . Memory loss 08/14/2009  . AV BLOCK, COMPLETE-s/p AV ablation 05/12/2009  . ATRIAL FIBRILLATION 05/12/2009  . CEREBROVASCULAR DISEASE 05/25/2008  . ANEMIA 11/08/2007  . GASTRITIS 11/08/2007  . DIVERTICULOSIS  OF COLON 11/08/2007  . HYPERCHOLESTEROLEMIA 09/24/2007  . ANXIETY 09/24/2007  . OBSTRUCTIVE SLEEP APNEA 09/24/2007  . DEGENERATIVE JOINT DISEASE 09/24/2007    PMH: Past Medical History  Diagnosis Date  . Obstructive sleep apnea     noncompliant with CPAP  . Ischemic cardiomyopathy   . Atrial fibrillation     on anticoag  . AV block, complete s/p ablation   . Pacemaker [place]   . Cerebrovascular disease   . Hypercholesterolemia   . Hiatal hernia   . Gastritis   . Diverticulosis   . Colonic polyp   . BPH (benign prostatic hypertrophy)   . DJD (degenerative joint disease)   . Memory loss   . Anxiety   . Anemia   . Shingles   . Carotid artery occlusion     s/p R CEA 08/2003, s/p L CEA 06/2013  . Hypertension   . Pneumonia     hx of  . Parkinsonian features   . CAD (coronary artery disease) 2000    s/p CABG x 4 07/1999  . Glaucoma   . Cancer of parotid gland     right    PSH: Past Surgical History  Procedure Laterality Date  . Pacemaker insertion    . Coronary artery bypass graft  07/1999    x4 by Dr. Jobie Quaker 9/00  . Carotid endarterectomy Right 08/2003    right 10/04 Dr. Scot Dock  . Anal fissure repair    . Hiatal hernia repair      incarcerated stomach with takedown and repair of hiatus and gastropexy  . Insert / replace / remove pacemaker    . Endarterectomy Left 06/27/2013    Procedure: Left Carotid Endarterectomy with Finesse patch angioplasty;  Surgeon: Angelia Mould,  MD;  Location: [facility];  Service: Vascular;  Laterality: Left;  . Hernia repair      ALLERGIES: No Known Allergies  MEDICATIONS: Current Outpatient Prescriptions  Medication Sig Dispense Refill  . acetaminophen (TYLENOL) 500 MG tablet Take 500 mg by mouth every 6 (six) hours as needed for pain.     . brimonidine-timolol (COMBIGAN) 0.2-0.5 % ophthalmic solution Place 1 drop into both eyes every 12 (twelve) hours.    Marland Kitchen dextromethorphan-guaiFENesin (MUCINEX DM) 30-600 MG per 12 hr  tablet Take 1 tablet by mouth 2 (two) times daily as needed.     . docusate sodium (COLACE) 100 MG capsule Take 1 capsule (100 mg total) by mouth every 12 (twelve) hours. 60 capsule 0  . donepezil (ARICEPT) 10 MG tablet TAKE  ONE TABLET BY MOUTH EVERY MORNING 30 tablet 5  . finasteride (PROSCAR) 5 MG tablet Take 5 mg by mouth every morning.     Marland Kitchen HYDROcodone-acetaminophen (NORCO/VICODIN) 5-325 MG per tablet     . latanoprost (XALATAN) 0.005 % ophthalmic solution Place 1 drop into both eyes daily.     Marland Kitchen lisinopril (PRINIVIL,ZESTRIL) 20 MG tablet TAKE 1 TABLET (20 MG TOTAL) BY MOUTH DAILY. 30 tablet 5  . memantine (NAMENDA) 10 MG tablet Take 1 tablet (10 mg total) by mouth 2 (two) times daily. 180 tablet 3  . QUEtiapine (SEROQUEL) 50 MG tablet TAKE TWO TABLETS BY MOUTH DAILY 60 tablet 3  . simvastatin (ZOCOR) 40 MG tablet TAKE ONE TABLET BY MOUTH AT BEDTIME 30 tablet 5  . timolol (BETIMOL) 0.5 % ophthalmic solution     . traMADol (ULTRAM) 50 MG tablet TAKE ONE TABLET BY MOUTH THREE TIMES DAILY AS NEEDED 90 tablet 0  . warfarin (COUMADIN) 5 MG tablet TAKE AS INSTRUCTED BY ANTICOAGULATION CLINIC 35 tablet 1   No current facility-administered medications for this visit.    LABS: Lab Results  Component Value Date   WBC 7.1 10/30/2014   HGB 13.8 10/30/2014   HCT 41.9 10/30/2014   MCV 94.9 10/30/2014   PLT 159 10/30/2014      Component Value Date/Time   NA 142 10/30/2014 1229   NA 141 12/19/2013 1240   K 4.3 10/30/2014 1229   K 4.2 12/19/2013 1240   CL 101 12/19/2013 1240   CO2 29 10/30/2014 1229   CO2 27 12/19/2013 1240   GLUCOSE 114 10/30/2014 1229   GLUCOSE 118* 12/19/2013 1240   BUN 13.4 10/30/2014 1229   BUN 20 12/19/2013 1240   CREATININE 1.2 10/30/2014 1229   CREATININE 1.19 12/19/2013 1240   CALCIUM 10.2 10/30/2014 1229   CALCIUM 9.9 12/19/2013 1240   GFRNONAA 56* 12/19/2013 1240   GFRAA 65* 12/19/2013 1240   Lab Results  Component Value Date   INR 2.5 10/20/2014    INR 2.1 09/22/2014   INR 4.0 09/01/2014   PROTIME 18.8 04/17/2009   No results found for: PTT  SOCIAL HISTORY: History   Social History  . Marital Status: Married    Spouse Name: arrie    Number of Children: 1  . Years of Education: N/A   Occupational History  . retired   .     Social History Main Topics  . Smoking status: Former Smoker -- 1.50 packs/day for 50 years    Types: Cigarettes    Quit date: 11/08/1995  . Smokeless tobacco: Never Used  . Alcohol Use: No     Comment: quit drinking back in 1977.  . Drug Use: No  .  Sexual Activity: Not Currently   Other Topics Concern  . Not on file   Social History Narrative   Married, wife Bonnita Levan, 21yrs   1 daughter from 1st marriage who lives nearby   Son died 05-05-2010 in tractor accident.    FAMILY HISTORY: Family History  Problem Relation Age of Onset  . Coronary artery disease Other     REVIEW OF SYSTEMS: Reviewed with the patient included in dental record.  DENTAL HISTORY: CHIEF COMPLAINT: Patient referred by Dr. Gery Pray for a preradiation therapy dental consultation.  HPI: LAURENCE CROFFORD is an 81 year old male recently diagnosed with right parotid cancer. Patient with anticipated radiation therapy with Dr. Sondra Come. Patient is now seen as part of a medically necessary preradiation therapy dental protocol examination.  The patient is edentulous. Patient has been wearing upper and lower complete denture for 40-50 years by report. Patient indicates that this set of dentures was fabricated approximately 42 years ago. The patient indicates that the dentures were fabricated in [place], [place]. Patient indicates that the dentures "fit good" and denies having any denture problems at this time.  DENTAL EXAMINATION: GENERAL: The patient is a well-developed, well-nourished male in no acute distress. Patient ambulates slowly and presented in a wheelchair today. HEAD AND NECK: Patient has significant right neck  lymphadenopathy. There is no obvious left neck lymphadenopathy. The patient denies acute TMJ symptoms. INTRAORAL EXAM: The patient is edentulous. Patient has normal saliva. The patient has significant atrophy of the edentulous alveolar ridges. There is no evidence of denture irritation. DENTITION: The patient is edentulous. PROSTHODONTIC: Patient has an upper and lower complete denture.  The patient indicates that the dentures "fit good".  Clinically the dentures are ill fitting. The patient now has an acquired class III occlusal relationship. The denture teeth of the dentures are significantly worn. Dentures are 65+ years old by patient report.  OCCLUSION:Patient has a poor occlusal scheme and malocclusion of the upper or lower complete dentures.  RADIOGRAPHIC INTERPRETATION: Orthopantogram was taken today. Patient is edentulous. There is severe atrophy of the mandibular alveolar ridges. The maxillary sinuses are noted bilaterally.  ASSESSMENTS: 1. Right parotid cancer 2. Preradiation therapy dental protocol examination 3. The patient is completely edentulous 4. There is severe atrophy of the mandibular alveolar ridges 5. Ill fitting maxillary mandibular complete dentures 6. No evidence of denture irritation at this time.  PLAN/RECOMMENDATIONS: 1. I discussed the risks, benefits, and complications of various treatment options with the patient in relationship to the medical and dental conditions, anticipated radiation therapy, and radiation therapy side effects to include xerostomia, trismus, mucositis, taste changes, gum and jawbone changes, and risk for osteoradionecrosis. We discussed various treatment options to include no treatment,  implant therapy, and replacement of  upper and lower complete dentures as indicated. The patient currently wishes to defer any dental treatment at this time. Patient will contact dental medicine if acute dental problems arise during the radiation therapy. Patient  will then follow up with dental medicine one month after he has completed radiation therapy to discuss possible referral to a prosthodontist for fabrication of a new upper and lower complete dentures with or without implants. The patient is currently cleared for radiation therapy at this time.  2. Discussion of findings with medical team and coordination of future medical and dental care as needed.  I spent in excess of  90 minutes during the conduct of this consultation and >50% of this time involved direct face-to-face encounter for counseling and/or coordination  of the patient's care.    Lenn Cal, DDS

## 2014-11-17 ENCOUNTER — Ambulatory Visit (INDEPENDENT_AMBULATORY_CARE_PROVIDER_SITE_OTHER): Payer: Medicare Other | Admitting: Surgery

## 2014-11-17 DIAGNOSIS — I679 Cerebrovascular disease, unspecified: Secondary | ICD-10-CM

## 2014-11-17 DIAGNOSIS — Z7901 Long term (current) use of anticoagulants: Secondary | ICD-10-CM

## 2014-11-17 DIAGNOSIS — I4891 Unspecified atrial fibrillation: Secondary | ICD-10-CM

## 2014-11-17 DIAGNOSIS — Z5181 Encounter for therapeutic drug level monitoring: Secondary | ICD-10-CM | POA: Diagnosis not present

## 2014-11-17 LAB — POCT INR
INR: 2.3
INR: 2.3

## 2014-11-20 ENCOUNTER — Encounter: Payer: Self-pay | Admitting: *Deleted

## 2014-11-20 ENCOUNTER — Ambulatory Visit
Admission: RE | Admit: 2014-11-20 | Discharge: 2014-11-20 | Disposition: A | Discharge status: Home / Self Care | Payer: Medicare Other | Source: Ambulatory Visit | Attending: Radiation Oncology | Admitting: Radiation Oncology

## 2014-11-20 VITALS — BP 132/66 | HR 70 | Temp 97.9°F | Resp 12

## 2014-11-20 DIAGNOSIS — Z951 Presence of aortocoronary bypass graft: Secondary | ICD-10-CM | POA: Diagnosis not present

## 2014-11-20 DIAGNOSIS — I255 Ischemic cardiomyopathy: Secondary | ICD-10-CM | POA: Diagnosis not present

## 2014-11-20 DIAGNOSIS — Z7901 Long term (current) use of anticoagulants: Secondary | ICD-10-CM | POA: Diagnosis not present

## 2014-11-20 DIAGNOSIS — I10 Essential (primary) hypertension: Secondary | ICD-10-CM | POA: Diagnosis not present

## 2014-11-20 DIAGNOSIS — E780 Pure hypercholesterolemia: Secondary | ICD-10-CM | POA: Diagnosis not present

## 2014-11-20 DIAGNOSIS — Z87891 Personal history of nicotine dependence: Secondary | ICD-10-CM | POA: Diagnosis not present

## 2014-11-20 DIAGNOSIS — I2510 Atherosclerotic heart disease of native coronary artery without angina pectoris: Secondary | ICD-10-CM | POA: Diagnosis not present

## 2014-11-20 DIAGNOSIS — Z79891 Long term (current) use of opiate analgesic: Secondary | ICD-10-CM | POA: Diagnosis not present

## 2014-11-20 DIAGNOSIS — Z950 Presence of cardiac pacemaker: Secondary | ICD-10-CM | POA: Diagnosis not present

## 2014-11-20 DIAGNOSIS — Z510 Encounter for antineoplastic radiation therapy: Secondary | ICD-10-CM | POA: Diagnosis not present

## 2014-11-20 DIAGNOSIS — N400 Enlarged prostate without lower urinary tract symptoms: Secondary | ICD-10-CM | POA: Diagnosis not present

## 2014-11-20 DIAGNOSIS — H409 Unspecified glaucoma: Secondary | ICD-10-CM | POA: Diagnosis not present

## 2014-11-20 DIAGNOSIS — C07 Malignant neoplasm of parotid gland: Secondary | ICD-10-CM | POA: Diagnosis not present

## 2014-11-20 DIAGNOSIS — I4891 Unspecified atrial fibrillation: Secondary | ICD-10-CM | POA: Diagnosis not present

## 2014-11-20 DIAGNOSIS — F419 Anxiety disorder, unspecified: Secondary | ICD-10-CM | POA: Diagnosis not present

## 2014-11-20 NOTE — Progress Notes (Signed)
To provide support and encouragement, care continuity and to assess for needs, met with patient and his wife during [employer].  Facilitated pacemaker check with [employer] staffperson.  Provided Tomo education to wife, I addressed her questions re: prognosis, deferred her to Dr. Sondra Come for additional discussion.  Returned patient via wheelchair to lobby and waited with him and wife until transportation arrived. They understand they can contact me with questions/concerns.  Gayleen Orem, RN, BSN, [place] at [place] [phone]

## 2014-11-20 NOTE — Progress Notes (Signed)
Pacemaker rep from [employer] checked Ricky [name] pacemaker before and after treatment.  He was monitored during treatment on the cardiac monitor.  Patient was 100% ventricular paced with a rate of 70-93 bpm.  Patient felt dizzy after treatment.  BP was 132/66.

## 2014-11-20 NOTE — Progress Notes (Signed)
  Radiation Oncology         [phone] ________________________________  Name: Ricky [name] MRN: [id number]  Date: 11/20/2014  DOB: [birth year]  Simulation Verification Note    ICD-9-CM ICD-10-CM   1. Cancer of parotid gland 142.0 C07     Status: outpatient  NARRATIVE: The patient was brought to the treatment unit and placed in the planned treatment position. The clinical setup was verified. Then port films were obtained and uploaded to the radiation oncology medical record software.  The treatment beams were carefully compared against the planned radiation fields. The position location and shape of the radiation fields was reviewed. They targeted volume of tissue appears to be appropriately covered by the radiation beams. Organs at risk appear to be excluded as planned.  Based on my personal review, I approved the simulation verification. The patient's treatment will proceed as planned.  -----------------------------------  Blair Promise, PhD, MD

## 2014-11-21 ENCOUNTER — Ambulatory Visit
Admission: RE | Admit: 2014-11-21 | Discharge: 2014-11-21 | Disposition: A | Discharge status: Home / Self Care | Payer: Medicare Other | Source: Ambulatory Visit | Attending: Radiation Oncology | Admitting: Radiation Oncology

## 2014-11-21 ENCOUNTER — Telehealth: Payer: Self-pay | Admitting: *Deleted

## 2014-11-21 DIAGNOSIS — N400 Enlarged prostate without lower urinary tract symptoms: Secondary | ICD-10-CM | POA: Diagnosis not present

## 2014-11-21 DIAGNOSIS — F419 Anxiety disorder, unspecified: Secondary | ICD-10-CM | POA: Diagnosis not present

## 2014-11-21 DIAGNOSIS — C07 Malignant neoplasm of parotid gland: Secondary | ICD-10-CM | POA: Diagnosis not present

## 2014-11-21 DIAGNOSIS — Z951 Presence of aortocoronary bypass graft: Secondary | ICD-10-CM | POA: Diagnosis not present

## 2014-11-21 DIAGNOSIS — Z950 Presence of cardiac pacemaker: Secondary | ICD-10-CM | POA: Diagnosis not present

## 2014-11-21 DIAGNOSIS — Z510 Encounter for antineoplastic radiation therapy: Secondary | ICD-10-CM | POA: Diagnosis not present

## 2014-11-21 DIAGNOSIS — Z79891 Long term (current) use of opiate analgesic: Secondary | ICD-10-CM | POA: Diagnosis not present

## 2014-11-21 DIAGNOSIS — I4891 Unspecified atrial fibrillation: Secondary | ICD-10-CM | POA: Diagnosis not present

## 2014-11-21 DIAGNOSIS — I255 Ischemic cardiomyopathy: Secondary | ICD-10-CM | POA: Diagnosis not present

## 2014-11-21 DIAGNOSIS — H409 Unspecified glaucoma: Secondary | ICD-10-CM | POA: Diagnosis not present

## 2014-11-21 DIAGNOSIS — Z7901 Long term (current) use of anticoagulants: Secondary | ICD-10-CM | POA: Diagnosis not present

## 2014-11-21 DIAGNOSIS — E780 Pure hypercholesterolemia: Secondary | ICD-10-CM | POA: Diagnosis not present

## 2014-11-21 DIAGNOSIS — I2510 Atherosclerotic heart disease of native coronary artery without angina pectoris: Secondary | ICD-10-CM | POA: Diagnosis not present

## 2014-11-21 DIAGNOSIS — I10 Essential (primary) hypertension: Secondary | ICD-10-CM | POA: Diagnosis not present

## 2014-11-21 DIAGNOSIS — Z87891 Personal history of nicotine dependence: Secondary | ICD-10-CM | POA: Diagnosis not present

## 2014-11-21 MED ORDER — BIAFINE EX EMUL
Freq: Once | CUTANEOUS | Status: AC
  Administered 2014-11-21: 12:00:00 via TOPICAL

## 2014-11-21 NOTE — Progress Notes (Signed)
Met with Ricky [name] and his wife for post sim education.  He was given the Radiation Therapy and You book along with handouts for mouth care and skin care.  Discussed potential side effects including skin changes, fatigue, mouth changes, hair loss and throat changes.  He was also given biafine cream and was instructed to apply it to his right face, ear and neck twice a day, after treatment and at bedtime.  His wife verbalized understanding in teach back mode.  They were educated about under treat day with Dr. Sondra Come on Tuesday's.

## 2014-11-21 NOTE — Telephone Encounter (Signed)
CALLED PATIENT TO INFORM OF APPT. FOR NUTRITION ON 11-05-15 @ 10:30 AM , LVM FOR A RETURN CALL

## 2014-11-24 ENCOUNTER — Ambulatory Visit
Admission: RE | Admit: 2014-11-24 | Discharge: 2014-11-24 | Disposition: A | Discharge status: Home / Self Care | Payer: Medicare Other | Source: Ambulatory Visit | Attending: Radiation Oncology | Admitting: Radiation Oncology

## 2014-11-24 DIAGNOSIS — I10 Essential (primary) hypertension: Secondary | ICD-10-CM | POA: Diagnosis not present

## 2014-11-24 DIAGNOSIS — N400 Enlarged prostate without lower urinary tract symptoms: Secondary | ICD-10-CM | POA: Diagnosis not present

## 2014-11-24 DIAGNOSIS — I4891 Unspecified atrial fibrillation: Secondary | ICD-10-CM | POA: Diagnosis not present

## 2014-11-24 DIAGNOSIS — F419 Anxiety disorder, unspecified: Secondary | ICD-10-CM | POA: Diagnosis not present

## 2014-11-24 DIAGNOSIS — Z951 Presence of aortocoronary bypass graft: Secondary | ICD-10-CM | POA: Diagnosis not present

## 2014-11-24 DIAGNOSIS — Z7901 Long term (current) use of anticoagulants: Secondary | ICD-10-CM | POA: Diagnosis not present

## 2014-11-24 DIAGNOSIS — Z79891 Long term (current) use of opiate analgesic: Secondary | ICD-10-CM | POA: Diagnosis not present

## 2014-11-24 DIAGNOSIS — H409 Unspecified glaucoma: Secondary | ICD-10-CM | POA: Diagnosis not present

## 2014-11-24 DIAGNOSIS — C07 Malignant neoplasm of parotid gland: Secondary | ICD-10-CM | POA: Diagnosis not present

## 2014-11-24 DIAGNOSIS — I2510 Atherosclerotic heart disease of native coronary artery without angina pectoris: Secondary | ICD-10-CM | POA: Diagnosis not present

## 2014-11-24 DIAGNOSIS — E780 Pure hypercholesterolemia: Secondary | ICD-10-CM | POA: Diagnosis not present

## 2014-11-24 DIAGNOSIS — Z510 Encounter for antineoplastic radiation therapy: Secondary | ICD-10-CM | POA: Diagnosis not present

## 2014-11-24 DIAGNOSIS — Z87891 Personal history of nicotine dependence: Secondary | ICD-10-CM | POA: Diagnosis not present

## 2014-11-24 DIAGNOSIS — Z950 Presence of cardiac pacemaker: Secondary | ICD-10-CM | POA: Diagnosis not present

## 2014-11-24 DIAGNOSIS — I255 Ischemic cardiomyopathy: Secondary | ICD-10-CM | POA: Diagnosis not present

## 2014-11-24 NOTE — Progress Notes (Signed)
Monitored Ricky [name] during treatment.  He was 100% ventricular paced with a rate from 70-101.  Strip printed.  Patient tolerated treatment well.

## 2014-11-25 ENCOUNTER — Ambulatory Visit
Admission: RE | Admit: 2014-11-25 | Discharge: 2014-11-25 | Disposition: A | Discharge status: Home / Self Care | Payer: Medicare Other | Source: Ambulatory Visit | Attending: Radiation Oncology | Admitting: Radiation Oncology

## 2014-11-25 ENCOUNTER — Encounter: Payer: Self-pay | Admitting: Radiation Oncology

## 2014-11-25 VITALS — BP 142/62 | HR 70 | Temp 98.0°F | Resp 16 | Ht 71.0 in | Wt 187.6 lb

## 2014-11-25 DIAGNOSIS — F419 Anxiety disorder, unspecified: Secondary | ICD-10-CM | POA: Diagnosis not present

## 2014-11-25 DIAGNOSIS — C07 Malignant neoplasm of parotid gland: Secondary | ICD-10-CM

## 2014-11-25 DIAGNOSIS — I4891 Unspecified atrial fibrillation: Secondary | ICD-10-CM | POA: Diagnosis not present

## 2014-11-25 DIAGNOSIS — Z87891 Personal history of nicotine dependence: Secondary | ICD-10-CM | POA: Diagnosis not present

## 2014-11-25 DIAGNOSIS — I10 Essential (primary) hypertension: Secondary | ICD-10-CM | POA: Diagnosis not present

## 2014-11-25 DIAGNOSIS — Z79891 Long term (current) use of opiate analgesic: Secondary | ICD-10-CM | POA: Diagnosis not present

## 2014-11-25 DIAGNOSIS — Z951 Presence of aortocoronary bypass graft: Secondary | ICD-10-CM | POA: Diagnosis not present

## 2014-11-25 DIAGNOSIS — Z950 Presence of cardiac pacemaker: Secondary | ICD-10-CM | POA: Diagnosis not present

## 2014-11-25 DIAGNOSIS — E780 Pure hypercholesterolemia: Secondary | ICD-10-CM | POA: Diagnosis not present

## 2014-11-25 DIAGNOSIS — I2510 Atherosclerotic heart disease of native coronary artery without angina pectoris: Secondary | ICD-10-CM | POA: Diagnosis not present

## 2014-11-25 DIAGNOSIS — H409 Unspecified glaucoma: Secondary | ICD-10-CM | POA: Diagnosis not present

## 2014-11-25 DIAGNOSIS — N400 Enlarged prostate without lower urinary tract symptoms: Secondary | ICD-10-CM | POA: Diagnosis not present

## 2014-11-25 DIAGNOSIS — Z7901 Long term (current) use of anticoagulants: Secondary | ICD-10-CM | POA: Diagnosis not present

## 2014-11-25 DIAGNOSIS — Z510 Encounter for antineoplastic radiation therapy: Secondary | ICD-10-CM | POA: Diagnosis not present

## 2014-11-25 DIAGNOSIS — I255 Ischemic cardiomyopathy: Secondary | ICD-10-CM | POA: Diagnosis not present

## 2014-11-25 NOTE — Progress Notes (Signed)
  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 . . . . . . . The patient presents for routine under treatment assessment.                                   The patient is without complaint. Tolerating treatments well thus far                                 Set-up films were reviewed.                                 The chart was checked. Physical Findings. . .  height is 5\' 11"  (1.803 m) and weight is 187 lb 9.6 oz (85.095 kg). His oral temperature is 98 F (36.7 C). His blood pressure is 142/62 and his pulse is 70. His respiration is 16 and oxygen saturation is 99%. . The right posterior auricular lymph node mass seems to be larger on exam today. There is no drainage from any of his lesions. The oral cavity is moist without secondary. Impression . . . . . . . The patient is tolerating radiation. Plan . . . . . . . . . . . . Continue treatment as planned.  ________________________________   Blair Promise, PhD, MD

## 2014-11-25 NOTE — Progress Notes (Addendum)
Sherald  has completed 4 fractions to his right neck.  He denies pain.  He reports his mouth has started getting dry.  He is able to swallow without any problems.  He does have trouble chewing so he has been eating soups.  His weight is stable.  He does feel dizzy after treatments.  His wife is concerned about a bump that he has on his right neck.  She said it was there before treatment started.  The skin on his right neck is red.  He is using biafine.  BP 142/62 mmHg  Pulse 70  Temp(Src) 98 F (36.7 C) (Oral)  Resp 16  Ht 5\' 11"  (1.803 m)  Wt 187 lb 9.6 oz (85.095 kg)  BMI 26.18 kg/m2  SpO2 99%

## 2014-11-26 ENCOUNTER — Ambulatory Visit
Admission: RE | Admit: 2014-11-26 | Discharge: 2014-11-26 | Disposition: A | Discharge status: Home / Self Care | Payer: Medicare Other | Source: Ambulatory Visit | Attending: Radiation Oncology | Admitting: Radiation Oncology

## 2014-11-26 ENCOUNTER — Encounter: Payer: Self-pay | Admitting: Radiation Oncology

## 2014-11-26 VITALS — BP 136/54 | HR 70 | Temp 98.7°F | Resp 16

## 2014-11-26 DIAGNOSIS — Z7901 Long term (current) use of anticoagulants: Secondary | ICD-10-CM | POA: Diagnosis not present

## 2014-11-26 DIAGNOSIS — C07 Malignant neoplasm of parotid gland: Secondary | ICD-10-CM

## 2014-11-26 DIAGNOSIS — H409 Unspecified glaucoma: Secondary | ICD-10-CM | POA: Diagnosis not present

## 2014-11-26 DIAGNOSIS — I2510 Atherosclerotic heart disease of native coronary artery without angina pectoris: Secondary | ICD-10-CM | POA: Diagnosis not present

## 2014-11-26 DIAGNOSIS — N400 Enlarged prostate without lower urinary tract symptoms: Secondary | ICD-10-CM | POA: Diagnosis not present

## 2014-11-26 DIAGNOSIS — Z87891 Personal history of nicotine dependence: Secondary | ICD-10-CM | POA: Diagnosis not present

## 2014-11-26 DIAGNOSIS — Z510 Encounter for antineoplastic radiation therapy: Secondary | ICD-10-CM | POA: Diagnosis not present

## 2014-11-26 DIAGNOSIS — I4891 Unspecified atrial fibrillation: Secondary | ICD-10-CM | POA: Diagnosis not present

## 2014-11-26 DIAGNOSIS — I255 Ischemic cardiomyopathy: Secondary | ICD-10-CM | POA: Diagnosis not present

## 2014-11-26 DIAGNOSIS — E780 Pure hypercholesterolemia: Secondary | ICD-10-CM | POA: Diagnosis not present

## 2014-11-26 DIAGNOSIS — I10 Essential (primary) hypertension: Secondary | ICD-10-CM | POA: Diagnosis not present

## 2014-11-26 DIAGNOSIS — Z950 Presence of cardiac pacemaker: Secondary | ICD-10-CM | POA: Diagnosis not present

## 2014-11-26 DIAGNOSIS — F419 Anxiety disorder, unspecified: Secondary | ICD-10-CM | POA: Diagnosis not present

## 2014-11-26 DIAGNOSIS — Z79891 Long term (current) use of opiate analgesic: Secondary | ICD-10-CM | POA: Diagnosis not present

## 2014-11-26 DIAGNOSIS — Z951 Presence of aortocoronary bypass graft: Secondary | ICD-10-CM | POA: Diagnosis not present

## 2014-11-26 NOTE — Progress Notes (Signed)
Patient's wife reports that he fell last night.  He reports that he was walking in the living room and tripped over his dog.  He denies felling dizzy before his fall.  911 was called and Terance said he hurt his right lower side and cut the back of his ear.  There is a cut on the back of his right ear that was cleansed with normal saline today.  The right ear also appears swollen.  He said the mask felt tight today.

## 2014-11-26 NOTE — Progress Notes (Signed)
  Radiation Oncology         [phone] ________________________________  Name: Ricky [name] MRN: [id number]  Date: 11/26/2014  DOB: [birth year]  Weekly Radiation Therapy Management  Current Dose: 10 Gy     Planned Dose:  60 Gy  Narrative . . . . . . . . The patient presents for routine under treatment assessment.                                   The patient tripped while walking. He denies any dizziness or loss of consciousness.                                 Set-up films were reviewed.                                 The chart was checked. Physical Findings. . .  oral temperature is 98.7 F (37.1 C). His blood pressure is 136/54 and his pulse is 70. His respiration is 16 and oxygen saturation is 97%. . Small superficial abrasion along the posterior aspect of his right ear.  No sutures required. Impression . . . . . . . The patient is tolerating radiation. Plan . . . . . . . . . . . . Continue treatment as planned. The wife will place a triple antibiotic ointment along this superficial abrasion  ________________________________   Blair Promise, PhD, MD

## 2014-11-27 ENCOUNTER — Ambulatory Visit
Admission: RE | Admit: 2014-11-27 | Discharge: 2014-11-27 | Disposition: A | Discharge status: Home / Self Care | Payer: Medicare Other | Source: Ambulatory Visit | Attending: Radiation Oncology | Admitting: Radiation Oncology

## 2014-11-27 ENCOUNTER — Encounter: Payer: Self-pay | Admitting: *Deleted

## 2014-11-27 DIAGNOSIS — F419 Anxiety disorder, unspecified: Secondary | ICD-10-CM | POA: Diagnosis not present

## 2014-11-27 DIAGNOSIS — C07 Malignant neoplasm of parotid gland: Secondary | ICD-10-CM | POA: Diagnosis not present

## 2014-11-27 DIAGNOSIS — E780 Pure hypercholesterolemia: Secondary | ICD-10-CM | POA: Diagnosis not present

## 2014-11-27 DIAGNOSIS — Z79891 Long term (current) use of opiate analgesic: Secondary | ICD-10-CM | POA: Diagnosis not present

## 2014-11-27 DIAGNOSIS — I10 Essential (primary) hypertension: Secondary | ICD-10-CM | POA: Diagnosis not present

## 2014-11-27 DIAGNOSIS — I2510 Atherosclerotic heart disease of native coronary artery without angina pectoris: Secondary | ICD-10-CM | POA: Diagnosis not present

## 2014-11-27 DIAGNOSIS — I4891 Unspecified atrial fibrillation: Secondary | ICD-10-CM | POA: Diagnosis not present

## 2014-11-27 DIAGNOSIS — I255 Ischemic cardiomyopathy: Secondary | ICD-10-CM | POA: Diagnosis not present

## 2014-11-27 DIAGNOSIS — Z87891 Personal history of nicotine dependence: Secondary | ICD-10-CM | POA: Diagnosis not present

## 2014-11-27 DIAGNOSIS — Z950 Presence of cardiac pacemaker: Secondary | ICD-10-CM | POA: Diagnosis not present

## 2014-11-27 DIAGNOSIS — Z7901 Long term (current) use of anticoagulants: Secondary | ICD-10-CM | POA: Diagnosis not present

## 2014-11-27 DIAGNOSIS — Z510 Encounter for antineoplastic radiation therapy: Secondary | ICD-10-CM | POA: Diagnosis not present

## 2014-11-27 DIAGNOSIS — H409 Unspecified glaucoma: Secondary | ICD-10-CM | POA: Diagnosis not present

## 2014-11-27 DIAGNOSIS — Z951 Presence of aortocoronary bypass graft: Secondary | ICD-10-CM | POA: Diagnosis not present

## 2014-11-27 DIAGNOSIS — N400 Enlarged prostate without lower urinary tract symptoms: Secondary | ICD-10-CM | POA: Diagnosis not present

## 2014-11-27 NOTE — Progress Notes (Signed)
Met with Polo Riley, SW, to discuss patient/wife needs.  Conversation included initiation of hospice care with [facility].  We discussed patient prognosis and expectation re: completion of tmts with Dr. Sondra Come.  He supported Lauren's suggestion of her making inquiry with hospice.    Gayleen Orem, RN, BSN, [place] at [place] [phone]

## 2014-11-27 NOTE — Progress Notes (Signed)
Met with patient and his wife prior to Tomo tmt.    I indicated they can expect visit by Polo Riley, SW, who will address their needs including transportation.    Wife noted increased R cheek swelling, indicated Dr. Sondra Come saw patient yesterday in this regard.    She indicated Dr. Sondra Come aware of patient's fall at home on Tuesday. He did not express any needs or concerns at this time, I encouraged them to contact me if that changes, they verbalized understanding.  Gayleen Orem, RN, BSN, [place] at [place] [phone]

## 2014-11-28 ENCOUNTER — Ambulatory Visit: Payer: Medicare Other

## 2014-12-01 ENCOUNTER — Ambulatory Visit
Admission: RE | Admit: 2014-12-01 | Discharge: 2014-12-01 | Disposition: A | Discharge status: Home / Self Care | Payer: Medicare Other | Source: Ambulatory Visit | Attending: Radiation Oncology | Admitting: Radiation Oncology

## 2014-12-01 DIAGNOSIS — H409 Unspecified glaucoma: Secondary | ICD-10-CM | POA: Diagnosis not present

## 2014-12-01 DIAGNOSIS — Z951 Presence of aortocoronary bypass graft: Secondary | ICD-10-CM | POA: Diagnosis not present

## 2014-12-01 DIAGNOSIS — Z510 Encounter for antineoplastic radiation therapy: Secondary | ICD-10-CM | POA: Diagnosis not present

## 2014-12-01 DIAGNOSIS — N400 Enlarged prostate without lower urinary tract symptoms: Secondary | ICD-10-CM | POA: Diagnosis not present

## 2014-12-01 DIAGNOSIS — C07 Malignant neoplasm of parotid gland: Secondary | ICD-10-CM | POA: Diagnosis not present

## 2014-12-01 DIAGNOSIS — Z79891 Long term (current) use of opiate analgesic: Secondary | ICD-10-CM | POA: Diagnosis not present

## 2014-12-01 DIAGNOSIS — Z87891 Personal history of nicotine dependence: Secondary | ICD-10-CM | POA: Diagnosis not present

## 2014-12-01 DIAGNOSIS — E780 Pure hypercholesterolemia: Secondary | ICD-10-CM | POA: Diagnosis not present

## 2014-12-01 DIAGNOSIS — I255 Ischemic cardiomyopathy: Secondary | ICD-10-CM | POA: Diagnosis not present

## 2014-12-01 DIAGNOSIS — I4891 Unspecified atrial fibrillation: Secondary | ICD-10-CM | POA: Diagnosis not present

## 2014-12-01 DIAGNOSIS — I2510 Atherosclerotic heart disease of native coronary artery without angina pectoris: Secondary | ICD-10-CM | POA: Diagnosis not present

## 2014-12-01 DIAGNOSIS — I10 Essential (primary) hypertension: Secondary | ICD-10-CM | POA: Diagnosis not present

## 2014-12-01 DIAGNOSIS — Z7901 Long term (current) use of anticoagulants: Secondary | ICD-10-CM | POA: Diagnosis not present

## 2014-12-01 DIAGNOSIS — F419 Anxiety disorder, unspecified: Secondary | ICD-10-CM | POA: Diagnosis not present

## 2014-12-01 DIAGNOSIS — Z950 Presence of cardiac pacemaker: Secondary | ICD-10-CM | POA: Diagnosis not present

## 2014-12-01 NOTE — Progress Notes (Addendum)
Monitored patient via [facility] MP5 during treatment.  Patient is pacing 100% ventricularly with a rate of 70-101.  Noted that right ear has dried blood present.  Patient's wife is appling neosporin to this area.  Patient's wife also reports that he has bruising on his left side from his fall at home last week.  Patient did see Dr. Sondra Come on 11/26/14 after the fall.  Purple/yellow bruise noted on his left lower side.  He denies pain in this area.  Patient and his wife stated that they do not want to see the doctor today.  They would like to wait until tomorrow.  Advised them to call or go to the ER if bruised area becomes painful or becomes larger.  They verbalized agreement.

## 2014-12-02 ENCOUNTER — Encounter: Payer: Self-pay | Admitting: *Deleted

## 2014-12-02 ENCOUNTER — Encounter: Payer: Self-pay | Admitting: Radiation Oncology

## 2014-12-02 ENCOUNTER — Ambulatory Visit
Admission: RE | Admit: 2014-12-02 | Discharge: 2014-12-02 | Disposition: A | Discharge status: Home / Self Care | Payer: Medicare Other | Source: Ambulatory Visit | Attending: Radiation Oncology | Admitting: Radiation Oncology

## 2014-12-02 VITALS — BP 123/56 | HR 69 | Temp 98.3°F | Resp 16 | Ht 71.0 in | Wt 185.5 lb

## 2014-12-02 DIAGNOSIS — H409 Unspecified glaucoma: Secondary | ICD-10-CM | POA: Diagnosis not present

## 2014-12-02 DIAGNOSIS — I255 Ischemic cardiomyopathy: Secondary | ICD-10-CM | POA: Diagnosis not present

## 2014-12-02 DIAGNOSIS — I10 Essential (primary) hypertension: Secondary | ICD-10-CM | POA: Diagnosis not present

## 2014-12-02 DIAGNOSIS — N400 Enlarged prostate without lower urinary tract symptoms: Secondary | ICD-10-CM | POA: Diagnosis not present

## 2014-12-02 DIAGNOSIS — Z950 Presence of cardiac pacemaker: Secondary | ICD-10-CM | POA: Diagnosis not present

## 2014-12-02 DIAGNOSIS — E780 Pure hypercholesterolemia: Secondary | ICD-10-CM | POA: Diagnosis not present

## 2014-12-02 DIAGNOSIS — Z951 Presence of aortocoronary bypass graft: Secondary | ICD-10-CM | POA: Diagnosis not present

## 2014-12-02 DIAGNOSIS — Z87891 Personal history of nicotine dependence: Secondary | ICD-10-CM | POA: Diagnosis not present

## 2014-12-02 DIAGNOSIS — Z79891 Long term (current) use of opiate analgesic: Secondary | ICD-10-CM | POA: Diagnosis not present

## 2014-12-02 DIAGNOSIS — I4891 Unspecified atrial fibrillation: Secondary | ICD-10-CM | POA: Diagnosis not present

## 2014-12-02 DIAGNOSIS — Z7901 Long term (current) use of anticoagulants: Secondary | ICD-10-CM | POA: Diagnosis not present

## 2014-12-02 DIAGNOSIS — F419 Anxiety disorder, unspecified: Secondary | ICD-10-CM | POA: Diagnosis not present

## 2014-12-02 DIAGNOSIS — Z510 Encounter for antineoplastic radiation therapy: Secondary | ICD-10-CM | POA: Diagnosis not present

## 2014-12-02 DIAGNOSIS — I2510 Atherosclerotic heart disease of native coronary artery without angina pectoris: Secondary | ICD-10-CM | POA: Diagnosis not present

## 2014-12-02 DIAGNOSIS — C07 Malignant neoplasm of parotid gland: Secondary | ICD-10-CM

## 2014-12-02 NOTE — Progress Notes (Signed)
  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 . . . . . . . The patient presents for routine under treatment assessment.                                   The patient is without complaint except for some generalized fatigue. He also has some soreness along his left lower chest and abdomen. The patient has somewhat of a poor appetite. He will be set up to see the nutritionist. The patient will occasionally have bleeding from the back part of his right ear. We have given the patient's wife some gauze to place on this area if this becomes more of an bleeding issue. He has lost 2 pounds since his weighing last week.                                 Set-up films were reviewed.                                 The chart was checked. Physical Findings. . .  height is 5\' 11"  (1.803 m) and weight is 185 lb 8 oz (84.142 kg). His oral temperature is 98.3 F (36.8 C). His blood pressure is 123/56 and his pulse is 69. His respiration is 16 and oxygen saturation is 96%. . The oral cavity somewhat dry. No secondary infection noted. Patient appears to have less swelling along the right face and parotid region today. No drainage noted from the tumor .The lungs are clear. The heart has regular rhythm and rate. the abdomen is soft and nontender with normal bowel sounds. Patient has some bruising present along the left upper abdominal area. Impression . . . . . . . The patient is tolerating radiation. Plan . . . . . . . . . . . . Continue treatment as planned.  ________________________________   Blair Promise, PhD, MD

## 2014-12-02 NOTE — Progress Notes (Signed)
Ricky [name]  has completed 8 fractions to his right neck.  He reports generalized pain at a 2/10.  He is taking norco 1-2 tablets daily.  His wife reports he is not eating as much.  He has lost 2 lbs since last week.  He denies trouble swallowing.  The swelling in his right neck and face has gone down a little bit.  He has two cuts on the back of his right ear that continue to bleed.  His wife is putting neosporin on it daily.  He denies mouth sores.  He reports he does have a dry mouth.  The skin on his right neck is red.  He is using biafine twice a day.  He continues to have a purple bruise on his lower left side.  He reports fatigue.

## 2014-12-02 NOTE — Progress Notes (Signed)
Monitored Ricky [name] during radiation treatment.  He was 100% ventricular paced and occasional atrial paced.  His rate was 70-101.

## 2014-12-03 ENCOUNTER — Ambulatory Visit
Admission: RE | Admit: 2014-12-03 | Discharge: 2014-12-03 | Disposition: A | Discharge status: Home / Self Care | Payer: Medicare Other | Source: Ambulatory Visit | Attending: Radiation Oncology | Admitting: Radiation Oncology

## 2014-12-03 DIAGNOSIS — Z7901 Long term (current) use of anticoagulants: Secondary | ICD-10-CM | POA: Diagnosis not present

## 2014-12-03 DIAGNOSIS — Z87891 Personal history of nicotine dependence: Secondary | ICD-10-CM | POA: Diagnosis not present

## 2014-12-03 DIAGNOSIS — F419 Anxiety disorder, unspecified: Secondary | ICD-10-CM | POA: Diagnosis not present

## 2014-12-03 DIAGNOSIS — Z510 Encounter for antineoplastic radiation therapy: Secondary | ICD-10-CM | POA: Diagnosis not present

## 2014-12-03 DIAGNOSIS — H409 Unspecified glaucoma: Secondary | ICD-10-CM | POA: Diagnosis not present

## 2014-12-03 DIAGNOSIS — Z950 Presence of cardiac pacemaker: Secondary | ICD-10-CM | POA: Diagnosis not present

## 2014-12-03 DIAGNOSIS — Z951 Presence of aortocoronary bypass graft: Secondary | ICD-10-CM | POA: Diagnosis not present

## 2014-12-03 DIAGNOSIS — I255 Ischemic cardiomyopathy: Secondary | ICD-10-CM | POA: Diagnosis not present

## 2014-12-03 DIAGNOSIS — I2510 Atherosclerotic heart disease of native coronary artery without angina pectoris: Secondary | ICD-10-CM | POA: Diagnosis not present

## 2014-12-03 DIAGNOSIS — Z79891 Long term (current) use of opiate analgesic: Secondary | ICD-10-CM | POA: Diagnosis not present

## 2014-12-03 DIAGNOSIS — N400 Enlarged prostate without lower urinary tract symptoms: Secondary | ICD-10-CM | POA: Diagnosis not present

## 2014-12-03 DIAGNOSIS — C07 Malignant neoplasm of parotid gland: Secondary | ICD-10-CM | POA: Diagnosis not present

## 2014-12-03 DIAGNOSIS — I10 Essential (primary) hypertension: Secondary | ICD-10-CM | POA: Diagnosis not present

## 2014-12-03 DIAGNOSIS — I4891 Unspecified atrial fibrillation: Secondary | ICD-10-CM | POA: Diagnosis not present

## 2014-12-03 DIAGNOSIS — E780 Pure hypercholesterolemia: Secondary | ICD-10-CM | POA: Diagnosis not present

## 2014-12-04 ENCOUNTER — Ambulatory Visit
Admission: RE | Admit: 2014-12-04 | Discharge: 2014-12-04 | Disposition: A | Discharge status: Home / Self Care | Payer: Medicare Other | Source: Ambulatory Visit | Attending: Radiation Oncology | Admitting: Radiation Oncology

## 2014-12-04 DIAGNOSIS — Z7901 Long term (current) use of anticoagulants: Secondary | ICD-10-CM | POA: Diagnosis not present

## 2014-12-04 DIAGNOSIS — F419 Anxiety disorder, unspecified: Secondary | ICD-10-CM | POA: Diagnosis not present

## 2014-12-04 DIAGNOSIS — Z79891 Long term (current) use of opiate analgesic: Secondary | ICD-10-CM | POA: Diagnosis not present

## 2014-12-04 DIAGNOSIS — Z951 Presence of aortocoronary bypass graft: Secondary | ICD-10-CM | POA: Diagnosis not present

## 2014-12-04 DIAGNOSIS — H409 Unspecified glaucoma: Secondary | ICD-10-CM | POA: Diagnosis not present

## 2014-12-04 DIAGNOSIS — Z87891 Personal history of nicotine dependence: Secondary | ICD-10-CM | POA: Diagnosis not present

## 2014-12-04 DIAGNOSIS — N400 Enlarged prostate without lower urinary tract symptoms: Secondary | ICD-10-CM | POA: Diagnosis not present

## 2014-12-04 DIAGNOSIS — I4891 Unspecified atrial fibrillation: Secondary | ICD-10-CM | POA: Diagnosis not present

## 2014-12-04 DIAGNOSIS — E780 Pure hypercholesterolemia: Secondary | ICD-10-CM | POA: Diagnosis not present

## 2014-12-04 DIAGNOSIS — I255 Ischemic cardiomyopathy: Secondary | ICD-10-CM | POA: Diagnosis not present

## 2014-12-04 DIAGNOSIS — Z510 Encounter for antineoplastic radiation therapy: Secondary | ICD-10-CM | POA: Diagnosis not present

## 2014-12-04 DIAGNOSIS — I10 Essential (primary) hypertension: Secondary | ICD-10-CM | POA: Diagnosis not present

## 2014-12-04 DIAGNOSIS — Z950 Presence of cardiac pacemaker: Secondary | ICD-10-CM | POA: Diagnosis not present

## 2014-12-04 DIAGNOSIS — I2510 Atherosclerotic heart disease of native coronary artery without angina pectoris: Secondary | ICD-10-CM | POA: Diagnosis not present

## 2014-12-04 DIAGNOSIS — C07 Malignant neoplasm of parotid gland: Secondary | ICD-10-CM | POA: Diagnosis not present

## 2014-12-04 NOTE — Progress Notes (Signed)
Monitored Mr. Tingler during treatment.  He was 100% ventricular paced with a rate from 70-101.

## 2014-12-04 NOTE — Progress Notes (Signed)
Patient's pacemaker monitored during treatment with [facility] MP5. 100 % Ventricular pacing, HR 70-101.

## 2014-12-05 ENCOUNTER — Ambulatory Visit
Admission: RE | Admit: 2014-12-05 | Discharge: 2014-12-05 | Disposition: A | Discharge status: Home / Self Care | Payer: Medicare Other | Source: Ambulatory Visit | Attending: Radiation Oncology | Admitting: Radiation Oncology

## 2014-12-05 ENCOUNTER — Ambulatory Visit: Payer: Medicare Other | Admitting: Nutrition

## 2014-12-05 DIAGNOSIS — Z87891 Personal history of nicotine dependence: Secondary | ICD-10-CM | POA: Diagnosis not present

## 2014-12-05 DIAGNOSIS — Z79891 Long term (current) use of opiate analgesic: Secondary | ICD-10-CM | POA: Diagnosis not present

## 2014-12-05 DIAGNOSIS — I4891 Unspecified atrial fibrillation: Secondary | ICD-10-CM | POA: Diagnosis not present

## 2014-12-05 DIAGNOSIS — Z510 Encounter for antineoplastic radiation therapy: Secondary | ICD-10-CM | POA: Diagnosis not present

## 2014-12-05 DIAGNOSIS — H409 Unspecified glaucoma: Secondary | ICD-10-CM | POA: Diagnosis not present

## 2014-12-05 DIAGNOSIS — I2510 Atherosclerotic heart disease of native coronary artery without angina pectoris: Secondary | ICD-10-CM | POA: Diagnosis not present

## 2014-12-05 DIAGNOSIS — Z951 Presence of aortocoronary bypass graft: Secondary | ICD-10-CM | POA: Diagnosis not present

## 2014-12-05 DIAGNOSIS — N400 Enlarged prostate without lower urinary tract symptoms: Secondary | ICD-10-CM | POA: Diagnosis not present

## 2014-12-05 DIAGNOSIS — I10 Essential (primary) hypertension: Secondary | ICD-10-CM | POA: Diagnosis not present

## 2014-12-05 DIAGNOSIS — Z7901 Long term (current) use of anticoagulants: Secondary | ICD-10-CM | POA: Diagnosis not present

## 2014-12-05 DIAGNOSIS — C07 Malignant neoplasm of parotid gland: Secondary | ICD-10-CM | POA: Diagnosis not present

## 2014-12-05 DIAGNOSIS — F419 Anxiety disorder, unspecified: Secondary | ICD-10-CM | POA: Diagnosis not present

## 2014-12-05 DIAGNOSIS — Z950 Presence of cardiac pacemaker: Secondary | ICD-10-CM | POA: Diagnosis not present

## 2014-12-05 DIAGNOSIS — E780 Pure hypercholesterolemia: Secondary | ICD-10-CM | POA: Diagnosis not present

## 2014-12-05 DIAGNOSIS — I255 Ischemic cardiomyopathy: Secondary | ICD-10-CM | POA: Diagnosis not present

## 2014-12-05 NOTE — Progress Notes (Signed)
To provide support and encouragement, care continuity and to assess for needs, met with patient during Tomo treatment and afterwards during Weekly UT with Dr. Sondra Come.  He was accompanied by his wife. He reported:  Occasional bleeding behind R ear.  Per Dr. Clabe Seal earlier guidance, he is applying Neosporin to area.  Denied difficulty swallowing.  "Pain all over" at 2/10.  He is taking hydrocodone 1-2 times daily.  L side is still sore, bruised r/t fall last week.  Wife reports chair that he was leaning on turned over.  Dr. Sondra Come aware from last week's WUT. Weight loss of 2 pounds noted today.  Dory Peru, Nutrition, to be notified for follow-up. They understand they can contact with needs/concerns.  Gayleen Orem, RN, BSN, [place] at [place] [phone]

## 2014-12-05 NOTE — Progress Notes (Signed)
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  Past medical history includes obstructive sleep apnea, atrial fibrillation, CVD, hiatal hernia, gastritis, diverticulosis, anxiety, hypertension, CAD status post CABG.  Medications include Colace, Seroquel, Zocor, and Coumadin.  Labs were reviewed.  Height: 71 inches. Weight: 185.5 pounds. Usual body weight: 200 pounds. BMI: 25.88.  Patient reports decreased appetite with decreased oral intake.   He does express difficulty chewing secondary to only wearing top dentures. Per wife dentures are "worn out".   Patient has had them "40 years." Patient unable to eat foods that he used to eat. Wife reports he refuses to try a new things to eat. Patient did drink a boost and enjoyed it. States he likes milkshakes. Patient has had 15 pound weight loss.  Nutrition Diagnosis: Inadequate oral intake related to parotid gland cancer and associated treatments as evidenced by 15 pound weight loss from usual body weight.  Intervention: Patient educated to consume soft moist foods frequently throughout the day. Recommended patient add ensure plus or boost +3 times a day between meals. Provided samples of oral nutrition supplements and coupons. Educated patient on strategies for increasing calories and protein. Provided fact sheets.  Questions were answered.  Teach back method used.  Contact information given.  Monitoring, evaluation, goals: Patient will work to increase calories and protein with oral nutrition supplements to minimize further weight loss.  Next visit: Follow-up has not been scheduled.  Wife agrees to contact me with questions or concerns.  **Disclaimer: This note was dictated with voice recognition software. Similar sounding words can inadvertently be transcribed and this note may contain transcription errors which may not have been corrected upon publication of note.**

## 2014-12-08 ENCOUNTER — Ambulatory Visit
Admission: RE | Admit: 2014-12-08 | Discharge: 2014-12-08 | Disposition: A | Discharge status: Home / Self Care | Payer: Medicare Other | Source: Ambulatory Visit | Attending: Radiation Oncology | Admitting: Radiation Oncology

## 2014-12-08 DIAGNOSIS — I10 Essential (primary) hypertension: Secondary | ICD-10-CM | POA: Diagnosis not present

## 2014-12-08 DIAGNOSIS — Z7901 Long term (current) use of anticoagulants: Secondary | ICD-10-CM | POA: Diagnosis not present

## 2014-12-08 DIAGNOSIS — H409 Unspecified glaucoma: Secondary | ICD-10-CM | POA: Diagnosis not present

## 2014-12-08 DIAGNOSIS — Z950 Presence of cardiac pacemaker: Secondary | ICD-10-CM | POA: Diagnosis not present

## 2014-12-08 DIAGNOSIS — Z951 Presence of aortocoronary bypass graft: Secondary | ICD-10-CM | POA: Diagnosis not present

## 2014-12-08 DIAGNOSIS — Z87891 Personal history of nicotine dependence: Secondary | ICD-10-CM | POA: Diagnosis not present

## 2014-12-08 DIAGNOSIS — I2510 Atherosclerotic heart disease of native coronary artery without angina pectoris: Secondary | ICD-10-CM | POA: Diagnosis not present

## 2014-12-08 DIAGNOSIS — I4891 Unspecified atrial fibrillation: Secondary | ICD-10-CM | POA: Diagnosis not present

## 2014-12-08 DIAGNOSIS — C07 Malignant neoplasm of parotid gland: Secondary | ICD-10-CM | POA: Diagnosis not present

## 2014-12-08 DIAGNOSIS — Z510 Encounter for antineoplastic radiation therapy: Secondary | ICD-10-CM | POA: Diagnosis not present

## 2014-12-08 DIAGNOSIS — N400 Enlarged prostate without lower urinary tract symptoms: Secondary | ICD-10-CM | POA: Diagnosis not present

## 2014-12-08 DIAGNOSIS — F419 Anxiety disorder, unspecified: Secondary | ICD-10-CM | POA: Diagnosis not present

## 2014-12-08 DIAGNOSIS — Z79891 Long term (current) use of opiate analgesic: Secondary | ICD-10-CM | POA: Diagnosis not present

## 2014-12-08 DIAGNOSIS — E780 Pure hypercholesterolemia: Secondary | ICD-10-CM | POA: Diagnosis not present

## 2014-12-08 DIAGNOSIS — I255 Ischemic cardiomyopathy: Secondary | ICD-10-CM | POA: Diagnosis not present

## 2014-12-08 NOTE — Progress Notes (Signed)
Monitored Ricky [name] during treatment.  He was 100% paced ventricular.  His rate was 70-100.

## 2014-12-08 NOTE — Progress Notes (Signed)
Monitored Ricky [name] during radiation treatment.  He was 100% paced ventricular with a rate of 70-101.

## 2014-12-09 ENCOUNTER — Encounter: Payer: Self-pay | Admitting: Radiation Oncology

## 2014-12-09 ENCOUNTER — Ambulatory Visit
Admission: RE | Admit: 2014-12-09 | Discharge: 2014-12-09 | Disposition: A | Discharge status: Home / Self Care | Payer: Medicare Other | Source: Ambulatory Visit | Attending: Radiation Oncology | Admitting: Radiation Oncology

## 2014-12-09 DIAGNOSIS — Z7901 Long term (current) use of anticoagulants: Secondary | ICD-10-CM | POA: Diagnosis not present

## 2014-12-09 DIAGNOSIS — F419 Anxiety disorder, unspecified: Secondary | ICD-10-CM | POA: Diagnosis not present

## 2014-12-09 DIAGNOSIS — Z950 Presence of cardiac pacemaker: Secondary | ICD-10-CM | POA: Diagnosis not present

## 2014-12-09 DIAGNOSIS — Z87891 Personal history of nicotine dependence: Secondary | ICD-10-CM | POA: Diagnosis not present

## 2014-12-09 DIAGNOSIS — H409 Unspecified glaucoma: Secondary | ICD-10-CM | POA: Diagnosis not present

## 2014-12-09 DIAGNOSIS — I255 Ischemic cardiomyopathy: Secondary | ICD-10-CM | POA: Diagnosis not present

## 2014-12-09 DIAGNOSIS — C07 Malignant neoplasm of parotid gland: Secondary | ICD-10-CM | POA: Insufficient documentation

## 2014-12-09 DIAGNOSIS — N400 Enlarged prostate without lower urinary tract symptoms: Secondary | ICD-10-CM | POA: Diagnosis not present

## 2014-12-09 DIAGNOSIS — I10 Essential (primary) hypertension: Secondary | ICD-10-CM | POA: Diagnosis not present

## 2014-12-09 DIAGNOSIS — I2510 Atherosclerotic heart disease of native coronary artery without angina pectoris: Secondary | ICD-10-CM | POA: Diagnosis not present

## 2014-12-09 DIAGNOSIS — Z951 Presence of aortocoronary bypass graft: Secondary | ICD-10-CM | POA: Diagnosis not present

## 2014-12-09 DIAGNOSIS — E780 Pure hypercholesterolemia: Secondary | ICD-10-CM | POA: Diagnosis not present

## 2014-12-09 DIAGNOSIS — Z510 Encounter for antineoplastic radiation therapy: Secondary | ICD-10-CM | POA: Diagnosis not present

## 2014-12-09 DIAGNOSIS — Z79891 Long term (current) use of opiate analgesic: Secondary | ICD-10-CM | POA: Diagnosis not present

## 2014-12-09 DIAGNOSIS — I4891 Unspecified atrial fibrillation: Secondary | ICD-10-CM | POA: Diagnosis not present

## 2014-12-09 MED ORDER — BIAFINE EX EMUL
Freq: Every day | CUTANEOUS | Status: DC
  Administered 2014-12-09: 11:00:00 via TOPICAL

## 2014-12-09 NOTE — Progress Notes (Signed)
Denies difficulty swallowing. Denies pain. Hyperpigmentation with dry desquamation at right ear. Dried blood noted behind right ear. Wife reports applying biafine and neosporin as directed. Weight and vitals stable. Denies headache or dizziness. Denies ringing in the ears.

## 2014-12-09 NOTE — Progress Notes (Signed)
  Radiation Oncology         [phone] ________________________________  Name: Ricky [name] MRN: [id number]  Date: 12/09/2014  DOB: 1931-04-15  Weekly Radiation Therapy Management  DIAGNOSIS: Poorly differentiated invasive carcinoma presenting in the right parotid gland, likely stage IV  Current Dose: 26 Gy     Planned Dose:  60 Gy  Narrative . . . . . . . . The patient presents for routine under treatment assessment.                                   The patient is without complaint except for some mild soreness in the right oral cavity. He denies any pain along the right parotid or ear area.                                  Set-up films were reviewed.                                 The chart was checked. Physical Findings. . .  weight is 185 lb 9.6 oz (84.188 kg). His blood pressure is 127/59 and his pulse is 70. His respiration is 16. . No secondary infection noted in the oral cavity. Patient continues to have significant swelling in the right face but is swelling in the parotid and postauricular area seems to be better. No drainage noted. No obvious signs of infection Impression . . . . . . . The patient is tolerating radiation. Plan . . . . . . . . . . . . Continue treatment as planned.  ________________________________   Blair Promise, PhD, MD

## 2014-12-09 NOTE — Progress Notes (Signed)
Monitored Ricky [name] during radiation treatment today.  He was 100% ventricular paced with occasional atrial pacing.  His rate was 67-101.

## 2014-12-10 ENCOUNTER — Ambulatory Visit
Admission: RE | Admit: 2014-12-10 | Discharge: 2014-12-10 | Disposition: A | Discharge status: Home / Self Care | Payer: Medicare Other | Source: Ambulatory Visit | Attending: Radiation Oncology | Admitting: Radiation Oncology

## 2014-12-10 DIAGNOSIS — Z510 Encounter for antineoplastic radiation therapy: Secondary | ICD-10-CM | POA: Diagnosis not present

## 2014-12-10 DIAGNOSIS — F419 Anxiety disorder, unspecified: Secondary | ICD-10-CM | POA: Diagnosis not present

## 2014-12-10 DIAGNOSIS — C07 Malignant neoplasm of parotid gland: Secondary | ICD-10-CM | POA: Diagnosis not present

## 2014-12-10 DIAGNOSIS — Z87891 Personal history of nicotine dependence: Secondary | ICD-10-CM | POA: Diagnosis not present

## 2014-12-10 DIAGNOSIS — I255 Ischemic cardiomyopathy: Secondary | ICD-10-CM | POA: Diagnosis not present

## 2014-12-10 DIAGNOSIS — N400 Enlarged prostate without lower urinary tract symptoms: Secondary | ICD-10-CM | POA: Diagnosis not present

## 2014-12-10 DIAGNOSIS — Z79891 Long term (current) use of opiate analgesic: Secondary | ICD-10-CM | POA: Diagnosis not present

## 2014-12-10 DIAGNOSIS — H409 Unspecified glaucoma: Secondary | ICD-10-CM | POA: Diagnosis not present

## 2014-12-10 DIAGNOSIS — I4891 Unspecified atrial fibrillation: Secondary | ICD-10-CM | POA: Diagnosis not present

## 2014-12-10 DIAGNOSIS — Z950 Presence of cardiac pacemaker: Secondary | ICD-10-CM | POA: Diagnosis not present

## 2014-12-10 DIAGNOSIS — E780 Pure hypercholesterolemia: Secondary | ICD-10-CM | POA: Diagnosis not present

## 2014-12-10 DIAGNOSIS — Z7901 Long term (current) use of anticoagulants: Secondary | ICD-10-CM | POA: Diagnosis not present

## 2014-12-10 DIAGNOSIS — I10 Essential (primary) hypertension: Secondary | ICD-10-CM | POA: Diagnosis not present

## 2014-12-10 DIAGNOSIS — I2510 Atherosclerotic heart disease of native coronary artery without angina pectoris: Secondary | ICD-10-CM | POA: Diagnosis not present

## 2014-12-10 DIAGNOSIS — Z951 Presence of aortocoronary bypass graft: Secondary | ICD-10-CM | POA: Diagnosis not present

## 2014-12-11 ENCOUNTER — Ambulatory Visit
Admission: RE | Admit: 2014-12-11 | Discharge: 2014-12-11 | Disposition: A | Discharge status: Home / Self Care | Payer: Medicare Other | Source: Ambulatory Visit | Attending: Radiation Oncology | Admitting: Radiation Oncology

## 2014-12-11 DIAGNOSIS — Z950 Presence of cardiac pacemaker: Secondary | ICD-10-CM | POA: Diagnosis not present

## 2014-12-11 DIAGNOSIS — Z87891 Personal history of nicotine dependence: Secondary | ICD-10-CM | POA: Diagnosis not present

## 2014-12-11 DIAGNOSIS — Z79891 Long term (current) use of opiate analgesic: Secondary | ICD-10-CM | POA: Diagnosis not present

## 2014-12-11 DIAGNOSIS — H409 Unspecified glaucoma: Secondary | ICD-10-CM | POA: Diagnosis not present

## 2014-12-11 DIAGNOSIS — Z510 Encounter for antineoplastic radiation therapy: Secondary | ICD-10-CM | POA: Diagnosis not present

## 2014-12-11 DIAGNOSIS — N400 Enlarged prostate without lower urinary tract symptoms: Secondary | ICD-10-CM | POA: Diagnosis not present

## 2014-12-11 DIAGNOSIS — C07 Malignant neoplasm of parotid gland: Secondary | ICD-10-CM | POA: Diagnosis not present

## 2014-12-11 DIAGNOSIS — Z7901 Long term (current) use of anticoagulants: Secondary | ICD-10-CM | POA: Diagnosis not present

## 2014-12-11 DIAGNOSIS — Z951 Presence of aortocoronary bypass graft: Secondary | ICD-10-CM | POA: Diagnosis not present

## 2014-12-11 DIAGNOSIS — E780 Pure hypercholesterolemia: Secondary | ICD-10-CM | POA: Diagnosis not present

## 2014-12-11 DIAGNOSIS — I4891 Unspecified atrial fibrillation: Secondary | ICD-10-CM | POA: Diagnosis not present

## 2014-12-11 DIAGNOSIS — I2510 Atherosclerotic heart disease of native coronary artery without angina pectoris: Secondary | ICD-10-CM | POA: Diagnosis not present

## 2014-12-11 DIAGNOSIS — F419 Anxiety disorder, unspecified: Secondary | ICD-10-CM | POA: Diagnosis not present

## 2014-12-11 DIAGNOSIS — I10 Essential (primary) hypertension: Secondary | ICD-10-CM | POA: Diagnosis not present

## 2014-12-11 DIAGNOSIS — I255 Ischemic cardiomyopathy: Secondary | ICD-10-CM | POA: Diagnosis not present

## 2014-12-11 NOTE — Progress Notes (Signed)
Patient gave name and dob as  identificatin,  5 lead EKG monitored patient during radiation treatment today, 100% ventricular pacing  rate 70-100, patient tolerated well, no c/o 10:42 AM

## 2014-12-12 ENCOUNTER — Ambulatory Visit
Admission: RE | Admit: 2014-12-12 | Discharge: 2014-12-12 | Disposition: A | Discharge status: Home / Self Care | Payer: Medicare Other | Source: Ambulatory Visit | Attending: Radiation Oncology | Admitting: Radiation Oncology

## 2014-12-12 DIAGNOSIS — Z79891 Long term (current) use of opiate analgesic: Secondary | ICD-10-CM | POA: Diagnosis not present

## 2014-12-12 DIAGNOSIS — E780 Pure hypercholesterolemia: Secondary | ICD-10-CM | POA: Diagnosis not present

## 2014-12-12 DIAGNOSIS — H409 Unspecified glaucoma: Secondary | ICD-10-CM | POA: Diagnosis not present

## 2014-12-12 DIAGNOSIS — F419 Anxiety disorder, unspecified: Secondary | ICD-10-CM | POA: Diagnosis not present

## 2014-12-12 DIAGNOSIS — I255 Ischemic cardiomyopathy: Secondary | ICD-10-CM | POA: Diagnosis not present

## 2014-12-12 DIAGNOSIS — Z510 Encounter for antineoplastic radiation therapy: Secondary | ICD-10-CM | POA: Diagnosis not present

## 2014-12-12 DIAGNOSIS — Z87891 Personal history of nicotine dependence: Secondary | ICD-10-CM | POA: Diagnosis not present

## 2014-12-12 DIAGNOSIS — Z951 Presence of aortocoronary bypass graft: Secondary | ICD-10-CM | POA: Diagnosis not present

## 2014-12-12 DIAGNOSIS — I10 Essential (primary) hypertension: Secondary | ICD-10-CM | POA: Diagnosis not present

## 2014-12-12 DIAGNOSIS — N400 Enlarged prostate without lower urinary tract symptoms: Secondary | ICD-10-CM | POA: Diagnosis not present

## 2014-12-12 DIAGNOSIS — Z7901 Long term (current) use of anticoagulants: Secondary | ICD-10-CM | POA: Diagnosis not present

## 2014-12-12 DIAGNOSIS — Z950 Presence of cardiac pacemaker: Secondary | ICD-10-CM | POA: Diagnosis not present

## 2014-12-12 DIAGNOSIS — C07 Malignant neoplasm of parotid gland: Secondary | ICD-10-CM | POA: Diagnosis not present

## 2014-12-12 DIAGNOSIS — I2510 Atherosclerotic heart disease of native coronary artery without angina pectoris: Secondary | ICD-10-CM | POA: Diagnosis not present

## 2014-12-12 DIAGNOSIS — I4891 Unspecified atrial fibrillation: Secondary | ICD-10-CM | POA: Diagnosis not present

## 2014-12-12 NOTE — Progress Notes (Signed)
Monitored Mr. Costilla during radiation treatment.  He was 100% ventricular paced with occasional atrial pacing.  His rate was 70 - 101.

## 2014-12-15 ENCOUNTER — Ambulatory Visit
Admission: RE | Admit: 2014-12-15 | Discharge: 2014-12-15 | Disposition: A | Discharge status: Home / Self Care | Payer: Medicare Other | Source: Ambulatory Visit | Attending: Radiation Oncology | Admitting: Radiation Oncology

## 2014-12-15 ENCOUNTER — Telehealth: Payer: Self-pay | Admitting: Internal Medicine

## 2014-12-15 DIAGNOSIS — I10 Essential (primary) hypertension: Secondary | ICD-10-CM | POA: Diagnosis not present

## 2014-12-15 DIAGNOSIS — C07 Malignant neoplasm of parotid gland: Secondary | ICD-10-CM | POA: Diagnosis not present

## 2014-12-15 DIAGNOSIS — N400 Enlarged prostate without lower urinary tract symptoms: Secondary | ICD-10-CM | POA: Diagnosis not present

## 2014-12-15 DIAGNOSIS — I255 Ischemic cardiomyopathy: Secondary | ICD-10-CM | POA: Diagnosis not present

## 2014-12-15 DIAGNOSIS — H409 Unspecified glaucoma: Secondary | ICD-10-CM | POA: Diagnosis not present

## 2014-12-15 DIAGNOSIS — Z950 Presence of cardiac pacemaker: Secondary | ICD-10-CM | POA: Diagnosis not present

## 2014-12-15 DIAGNOSIS — Z510 Encounter for antineoplastic radiation therapy: Secondary | ICD-10-CM | POA: Diagnosis not present

## 2014-12-15 DIAGNOSIS — E780 Pure hypercholesterolemia: Secondary | ICD-10-CM | POA: Diagnosis not present

## 2014-12-15 DIAGNOSIS — Z951 Presence of aortocoronary bypass graft: Secondary | ICD-10-CM | POA: Diagnosis not present

## 2014-12-15 DIAGNOSIS — Z7901 Long term (current) use of anticoagulants: Secondary | ICD-10-CM | POA: Diagnosis not present

## 2014-12-15 DIAGNOSIS — Z87891 Personal history of nicotine dependence: Secondary | ICD-10-CM | POA: Diagnosis not present

## 2014-12-15 DIAGNOSIS — F419 Anxiety disorder, unspecified: Secondary | ICD-10-CM | POA: Diagnosis not present

## 2014-12-15 DIAGNOSIS — I4891 Unspecified atrial fibrillation: Secondary | ICD-10-CM | POA: Diagnosis not present

## 2014-12-15 DIAGNOSIS — Z79891 Long term (current) use of opiate analgesic: Secondary | ICD-10-CM | POA: Diagnosis not present

## 2014-12-15 DIAGNOSIS — I2510 Atherosclerotic heart disease of native coronary artery without angina pectoris: Secondary | ICD-10-CM | POA: Diagnosis not present

## 2014-12-15 NOTE — Telephone Encounter (Signed)
Called spoke with pt's wife, advised radiation itself shouldn't effect INR levels. Inquired about pt's diet and if he was eating regularly and pt's wife states his isn't eating much at all, has a very poor appetite.  BRB present after pt c/o constipation and had BM.  BRB Present on wipes after cleaned pt up.  Made pt appt to check INR tomorrow 12/16/14.  Pt has appt to see primary MD 12/18/14, advised to keep appt.

## 2014-12-15 NOTE — Telephone Encounter (Signed)
New message      Pt is on coumadin and is now taking radiation for cancer.  Wife says yesterday, pt had blood in his stool. Please advise

## 2014-12-15 NOTE — Progress Notes (Addendum)
Monitored [name] during radiation treatment.  He was pacing 100% ventricular with occasional atrial pacing.  His heart rate was 70-93.    Also, his wife, Ricky [name], mentioned that he had a bowel movement yesterday and had a moderate amount of bright red blood present when she was helping him to clean up.  Ricky [name] reports that he used to have hemorrhoids but had treatment for them. Ricky [name] is taking coumadin. They do not want to stay and see the doctor today.  Advised them to call Dr. Caryl Comes and let him know about the rectal bleeding since he manages Ricky [name] coumadin.  Both Ricky [name] and Ricky [name] verbalized agreement.

## 2014-12-16 ENCOUNTER — Ambulatory Visit
Admission: RE | Admit: 2014-12-16 | Discharge: 2014-12-16 | Disposition: A | Discharge status: Home / Self Care | Payer: Medicare Other | Source: Ambulatory Visit | Attending: Radiation Oncology | Admitting: Radiation Oncology

## 2014-12-16 ENCOUNTER — Encounter: Payer: Self-pay | Admitting: Radiation Oncology

## 2014-12-16 ENCOUNTER — Ambulatory Visit (INDEPENDENT_AMBULATORY_CARE_PROVIDER_SITE_OTHER): Payer: Medicare Other

## 2014-12-16 VITALS — BP 138/57 | HR 72 | Temp 98.2°F | Resp 20 | Ht 71.0 in | Wt 183.3 lb

## 2014-12-16 DIAGNOSIS — I679 Cerebrovascular disease, unspecified: Secondary | ICD-10-CM

## 2014-12-16 DIAGNOSIS — N400 Enlarged prostate without lower urinary tract symptoms: Secondary | ICD-10-CM | POA: Diagnosis not present

## 2014-12-16 DIAGNOSIS — Z951 Presence of aortocoronary bypass graft: Secondary | ICD-10-CM | POA: Diagnosis not present

## 2014-12-16 DIAGNOSIS — Z7901 Long term (current) use of anticoagulants: Secondary | ICD-10-CM

## 2014-12-16 DIAGNOSIS — C07 Malignant neoplasm of parotid gland: Secondary | ICD-10-CM

## 2014-12-16 DIAGNOSIS — H409 Unspecified glaucoma: Secondary | ICD-10-CM | POA: Diagnosis not present

## 2014-12-16 DIAGNOSIS — I255 Ischemic cardiomyopathy: Secondary | ICD-10-CM | POA: Diagnosis not present

## 2014-12-16 DIAGNOSIS — Z79891 Long term (current) use of opiate analgesic: Secondary | ICD-10-CM | POA: Diagnosis not present

## 2014-12-16 DIAGNOSIS — Z5181 Encounter for therapeutic drug level monitoring: Secondary | ICD-10-CM | POA: Diagnosis not present

## 2014-12-16 DIAGNOSIS — I4891 Unspecified atrial fibrillation: Secondary | ICD-10-CM | POA: Diagnosis not present

## 2014-12-16 DIAGNOSIS — E780 Pure hypercholesterolemia: Secondary | ICD-10-CM | POA: Diagnosis not present

## 2014-12-16 DIAGNOSIS — I2510 Atherosclerotic heart disease of native coronary artery without angina pectoris: Secondary | ICD-10-CM | POA: Diagnosis not present

## 2014-12-16 DIAGNOSIS — F419 Anxiety disorder, unspecified: Secondary | ICD-10-CM | POA: Diagnosis not present

## 2014-12-16 DIAGNOSIS — Z510 Encounter for antineoplastic radiation therapy: Secondary | ICD-10-CM | POA: Diagnosis not present

## 2014-12-16 DIAGNOSIS — Z950 Presence of cardiac pacemaker: Secondary | ICD-10-CM | POA: Diagnosis not present

## 2014-12-16 DIAGNOSIS — I10 Essential (primary) hypertension: Secondary | ICD-10-CM | POA: Diagnosis not present

## 2014-12-16 DIAGNOSIS — Z87891 Personal history of nicotine dependence: Secondary | ICD-10-CM | POA: Diagnosis not present

## 2014-12-16 LAB — POCT INR: INR: 3.8

## 2014-12-16 NOTE — Progress Notes (Signed)
  Radiation Oncology         [phone] ________________________________  Name: Ricky [name] MRN: [id number]  Date: 12/16/2014  DOB: [birth year]  Weekly Radiation Therapy Management  DIAGNOSIS: Poorly differentiated invasive carcinoma presenting in the right parotid gland, likely stage IV  Current Dose: 36 Gy     Planned Dose:  60 Gy  Narrative . . . . . . . . The patient presents for routine under treatment assessment.                                   The patient is without complaint. He denies any pain along the right face or ear area. According to his wife he does pick at the right ear and as consequences had some bleeding. He is also had some rectal bleeding and will have  his INR checked later today.  He is becoming fatigued and he will have a break later this week with a 4 day weekend. His appetite is poor                                 Set-up films were reviewed.                                 The chart was checked. Physical Findings. . .  height is 5\' 11"  (1.803 m) and weight is 183 lb 4.8 oz (83.144 kg). His oral temperature is 98.2 F (36.8 C). His blood pressure is 138/57 and his pulse is 72. His respiration is 20 and oxygen saturation is 98%. . Patient has some yellowish drainage from the right postauricular area. This is likely tumor necrosis. No active bleeding at this time. Overall there is less swelling along the right face region. Impression . . . . . . . The patient is tolerating radiation. Plan . . . . . . . . . . . . Continue treatment as planned. Short break in treatment later this week as above  ________________________________   Blair Promise, PhD, MD

## 2014-12-16 NOTE — Progress Notes (Signed)
Ricky [name]  has completed 18 fractions to his right neck.  He denies pain.  He reports that his gums are sore.  He denies a sore throat and a dry mouth.  He reports a poor appetite and has lost 2 lbs since last week.  His wife is trying to get him to eat but he says nothing sounds good.  He has been drinking carnation instant breakfast shakes occasionally.  The skin on his right face/neck/ear is red with dried blood and yellow scabbed areas.  He is using bafine gel on his right face/ear and antibiotic ointment on the back of his right ear.  He reports the skin is itching.  He is going to have his INR checked today due to his rectal bleeding 2 days ago.  BP 138/57 mmHg  Pulse 72  Temp(Src) 98.2 F (36.8 C) (Oral)  Resp 20  Ht 5\' 11"  (1.803 m)  Wt 183 lb 4.8 oz (83.144 kg)  BMI 25.58 kg/m2  SpO2 98%

## 2014-12-16 NOTE — Progress Notes (Signed)
Monitored Ricky [name] 5 lead EKG, pacing 100%,  Heart rates=70-93 today  10:31 AM

## 2014-12-17 ENCOUNTER — Ambulatory Visit
Admission: RE | Admit: 2014-12-17 | Discharge: 2014-12-17 | Disposition: A | Discharge status: Home / Self Care | Payer: Medicare Other | Source: Ambulatory Visit | Attending: Radiation Oncology | Admitting: Radiation Oncology

## 2014-12-17 DIAGNOSIS — Z79891 Long term (current) use of opiate analgesic: Secondary | ICD-10-CM | POA: Diagnosis not present

## 2014-12-17 DIAGNOSIS — I4891 Unspecified atrial fibrillation: Secondary | ICD-10-CM | POA: Diagnosis not present

## 2014-12-17 DIAGNOSIS — Z7901 Long term (current) use of anticoagulants: Secondary | ICD-10-CM | POA: Diagnosis not present

## 2014-12-17 DIAGNOSIS — F419 Anxiety disorder, unspecified: Secondary | ICD-10-CM | POA: Diagnosis not present

## 2014-12-17 DIAGNOSIS — Z951 Presence of aortocoronary bypass graft: Secondary | ICD-10-CM | POA: Diagnosis not present

## 2014-12-17 DIAGNOSIS — Z87891 Personal history of nicotine dependence: Secondary | ICD-10-CM | POA: Diagnosis not present

## 2014-12-17 DIAGNOSIS — N400 Enlarged prostate without lower urinary tract symptoms: Secondary | ICD-10-CM | POA: Diagnosis not present

## 2014-12-17 DIAGNOSIS — E780 Pure hypercholesterolemia: Secondary | ICD-10-CM | POA: Diagnosis not present

## 2014-12-17 DIAGNOSIS — I2510 Atherosclerotic heart disease of native coronary artery without angina pectoris: Secondary | ICD-10-CM | POA: Diagnosis not present

## 2014-12-17 DIAGNOSIS — H409 Unspecified glaucoma: Secondary | ICD-10-CM | POA: Diagnosis not present

## 2014-12-17 DIAGNOSIS — C07 Malignant neoplasm of parotid gland: Secondary | ICD-10-CM | POA: Diagnosis not present

## 2014-12-17 DIAGNOSIS — Z950 Presence of cardiac pacemaker: Secondary | ICD-10-CM | POA: Diagnosis not present

## 2014-12-17 DIAGNOSIS — I10 Essential (primary) hypertension: Secondary | ICD-10-CM | POA: Diagnosis not present

## 2014-12-17 DIAGNOSIS — Z510 Encounter for antineoplastic radiation therapy: Secondary | ICD-10-CM | POA: Diagnosis not present

## 2014-12-17 DIAGNOSIS — I255 Ischemic cardiomyopathy: Secondary | ICD-10-CM | POA: Diagnosis not present

## 2014-12-18 ENCOUNTER — Ambulatory Visit: Payer: Medicare Other

## 2014-12-19 ENCOUNTER — Ambulatory Visit: Payer: Medicare Other

## 2014-12-22 ENCOUNTER — Ambulatory Visit: Payer: Medicare Other

## 2014-12-22 ENCOUNTER — Telehealth: Payer: Self-pay | Admitting: *Deleted

## 2014-12-22 ENCOUNTER — Ambulatory Visit: Admission: RE | Admit: 2014-12-22 | Payer: Medicare Other | Source: Ambulatory Visit

## 2014-12-22 NOTE — Telephone Encounter (Signed)
Patient's wife called to cancel tmt appt d/t weather.  Tomo, Dr. Sondra Come and RN Santiago Glad notified.  Gayleen Orem, RN, BSN, [place] at [place] [phone]

## 2014-12-23 ENCOUNTER — Ambulatory Visit: Payer: Medicare Other

## 2014-12-23 ENCOUNTER — Ambulatory Visit
Admission: RE | Admit: 2014-12-23 | Discharge: 2014-12-23 | Disposition: A | Discharge status: Home / Self Care | Payer: Medicare Other | Source: Ambulatory Visit | Attending: Radiation Oncology | Admitting: Radiation Oncology

## 2014-12-23 ENCOUNTER — Ambulatory Visit: Payer: Medicare Other | Admitting: Radiation Oncology

## 2014-12-24 ENCOUNTER — Ambulatory Visit
Admission: RE | Admit: 2014-12-24 | Discharge: 2014-12-24 | Disposition: A | Discharge status: Home / Self Care | Payer: Medicare Other | Source: Ambulatory Visit | Attending: Radiation Oncology | Admitting: Radiation Oncology

## 2014-12-24 ENCOUNTER — Ambulatory Visit: Payer: Medicare Other | Admitting: Internal Medicine

## 2014-12-24 DIAGNOSIS — F419 Anxiety disorder, unspecified: Secondary | ICD-10-CM | POA: Diagnosis not present

## 2014-12-24 DIAGNOSIS — H409 Unspecified glaucoma: Secondary | ICD-10-CM | POA: Diagnosis not present

## 2014-12-24 DIAGNOSIS — Z510 Encounter for antineoplastic radiation therapy: Secondary | ICD-10-CM | POA: Diagnosis not present

## 2014-12-24 DIAGNOSIS — I10 Essential (primary) hypertension: Secondary | ICD-10-CM | POA: Diagnosis not present

## 2014-12-24 DIAGNOSIS — Z950 Presence of cardiac pacemaker: Secondary | ICD-10-CM | POA: Diagnosis not present

## 2014-12-24 DIAGNOSIS — Z7901 Long term (current) use of anticoagulants: Secondary | ICD-10-CM | POA: Diagnosis not present

## 2014-12-24 DIAGNOSIS — Z951 Presence of aortocoronary bypass graft: Secondary | ICD-10-CM | POA: Diagnosis not present

## 2014-12-24 DIAGNOSIS — E780 Pure hypercholesterolemia: Secondary | ICD-10-CM | POA: Diagnosis not present

## 2014-12-24 DIAGNOSIS — I2510 Atherosclerotic heart disease of native coronary artery without angina pectoris: Secondary | ICD-10-CM | POA: Diagnosis not present

## 2014-12-24 DIAGNOSIS — Z79891 Long term (current) use of opiate analgesic: Secondary | ICD-10-CM | POA: Diagnosis not present

## 2014-12-24 DIAGNOSIS — C07 Malignant neoplasm of parotid gland: Secondary | ICD-10-CM | POA: Diagnosis not present

## 2014-12-24 DIAGNOSIS — I255 Ischemic cardiomyopathy: Secondary | ICD-10-CM | POA: Diagnosis not present

## 2014-12-24 DIAGNOSIS — I4891 Unspecified atrial fibrillation: Secondary | ICD-10-CM | POA: Diagnosis not present

## 2014-12-24 DIAGNOSIS — Z87891 Personal history of nicotine dependence: Secondary | ICD-10-CM | POA: Diagnosis not present

## 2014-12-24 DIAGNOSIS — N400 Enlarged prostate without lower urinary tract symptoms: Secondary | ICD-10-CM | POA: Diagnosis not present

## 2014-12-25 ENCOUNTER — Ambulatory Visit
Admission: RE | Admit: 2014-12-25 | Discharge: 2014-12-25 | Disposition: A | Discharge status: Home / Self Care | Payer: Medicare Other | Source: Ambulatory Visit | Attending: Radiation Oncology | Admitting: Radiation Oncology

## 2014-12-25 DIAGNOSIS — N400 Enlarged prostate without lower urinary tract symptoms: Secondary | ICD-10-CM | POA: Diagnosis not present

## 2014-12-25 DIAGNOSIS — Z510 Encounter for antineoplastic radiation therapy: Secondary | ICD-10-CM | POA: Diagnosis not present

## 2014-12-25 DIAGNOSIS — F419 Anxiety disorder, unspecified: Secondary | ICD-10-CM | POA: Diagnosis not present

## 2014-12-25 DIAGNOSIS — Z951 Presence of aortocoronary bypass graft: Secondary | ICD-10-CM | POA: Diagnosis not present

## 2014-12-25 DIAGNOSIS — H409 Unspecified glaucoma: Secondary | ICD-10-CM | POA: Diagnosis not present

## 2014-12-25 DIAGNOSIS — I10 Essential (primary) hypertension: Secondary | ICD-10-CM | POA: Diagnosis not present

## 2014-12-25 DIAGNOSIS — Z950 Presence of cardiac pacemaker: Secondary | ICD-10-CM | POA: Diagnosis not present

## 2014-12-25 DIAGNOSIS — Z87891 Personal history of nicotine dependence: Secondary | ICD-10-CM | POA: Diagnosis not present

## 2014-12-25 DIAGNOSIS — Z7901 Long term (current) use of anticoagulants: Secondary | ICD-10-CM | POA: Diagnosis not present

## 2014-12-25 DIAGNOSIS — Z79891 Long term (current) use of opiate analgesic: Secondary | ICD-10-CM | POA: Diagnosis not present

## 2014-12-25 DIAGNOSIS — C07 Malignant neoplasm of parotid gland: Secondary | ICD-10-CM | POA: Diagnosis not present

## 2014-12-25 DIAGNOSIS — I4891 Unspecified atrial fibrillation: Secondary | ICD-10-CM | POA: Diagnosis not present

## 2014-12-25 DIAGNOSIS — E780 Pure hypercholesterolemia: Secondary | ICD-10-CM | POA: Diagnosis not present

## 2014-12-25 DIAGNOSIS — I2510 Atherosclerotic heart disease of native coronary artery without angina pectoris: Secondary | ICD-10-CM | POA: Diagnosis not present

## 2014-12-25 DIAGNOSIS — I255 Ischemic cardiomyopathy: Secondary | ICD-10-CM | POA: Diagnosis not present

## 2014-12-25 NOTE — Progress Notes (Signed)
Monitored Ricky [name] during radiation treatment.  He was 100% ventricular paced with a heart rate of 70-95.

## 2014-12-26 ENCOUNTER — Telehealth: Payer: Self-pay | Admitting: Oncology

## 2014-12-26 ENCOUNTER — Other Ambulatory Visit: Payer: Self-pay | Admitting: *Deleted

## 2014-12-26 ENCOUNTER — Ambulatory Visit
Admission: RE | Admit: 2014-12-26 | Discharge: 2014-12-26 | Disposition: A | Discharge status: Home / Self Care | Payer: Medicare Other | Source: Ambulatory Visit | Attending: Radiation Oncology | Admitting: Radiation Oncology

## 2014-12-26 DIAGNOSIS — I4891 Unspecified atrial fibrillation: Secondary | ICD-10-CM | POA: Diagnosis not present

## 2014-12-26 DIAGNOSIS — Z87891 Personal history of nicotine dependence: Secondary | ICD-10-CM | POA: Diagnosis not present

## 2014-12-26 DIAGNOSIS — I10 Essential (primary) hypertension: Secondary | ICD-10-CM | POA: Diagnosis not present

## 2014-12-26 DIAGNOSIS — H409 Unspecified glaucoma: Secondary | ICD-10-CM | POA: Diagnosis not present

## 2014-12-26 DIAGNOSIS — C07 Malignant neoplasm of parotid gland: Secondary | ICD-10-CM

## 2014-12-26 DIAGNOSIS — N400 Enlarged prostate without lower urinary tract symptoms: Secondary | ICD-10-CM | POA: Diagnosis not present

## 2014-12-26 DIAGNOSIS — Z951 Presence of aortocoronary bypass graft: Secondary | ICD-10-CM | POA: Diagnosis not present

## 2014-12-26 DIAGNOSIS — F419 Anxiety disorder, unspecified: Secondary | ICD-10-CM | POA: Diagnosis not present

## 2014-12-26 DIAGNOSIS — Z5181 Encounter for therapeutic drug level monitoring: Secondary | ICD-10-CM

## 2014-12-26 DIAGNOSIS — Z510 Encounter for antineoplastic radiation therapy: Secondary | ICD-10-CM | POA: Diagnosis not present

## 2014-12-26 DIAGNOSIS — Z7901 Long term (current) use of anticoagulants: Secondary | ICD-10-CM | POA: Diagnosis not present

## 2014-12-26 DIAGNOSIS — I255 Ischemic cardiomyopathy: Secondary | ICD-10-CM | POA: Diagnosis not present

## 2014-12-26 DIAGNOSIS — E780 Pure hypercholesterolemia: Secondary | ICD-10-CM | POA: Diagnosis not present

## 2014-12-26 DIAGNOSIS — Z950 Presence of cardiac pacemaker: Secondary | ICD-10-CM | POA: Diagnosis not present

## 2014-12-26 DIAGNOSIS — Z79891 Long term (current) use of opiate analgesic: Secondary | ICD-10-CM | POA: Diagnosis not present

## 2014-12-26 DIAGNOSIS — I2510 Atherosclerotic heart disease of native coronary artery without angina pectoris: Secondary | ICD-10-CM | POA: Diagnosis not present

## 2014-12-26 MED ORDER — BIAFINE EX EMUL
Freq: Once | CUTANEOUS | Status: AC
  Administered 2014-12-26: 10:00:00 via TOPICAL

## 2014-12-26 NOTE — Telephone Encounter (Signed)
Ricky [name] called and left a message to see if Ricky [name] can have his INR drawn on Tuesday at the [facility] instead of the [facility] since he will be here for radiation.

## 2014-12-26 NOTE — Progress Notes (Signed)
Monitored Mr. Schreur during radiation treatment.  He was 100% ventricular paced with occasional atrial pacing.  His rate was 70-101.  His wife also requested a refill on biafine.  Another tube has been given.

## 2014-12-26 NOTE — Telephone Encounter (Signed)
Called [name] back regarding Rogen's INR.  Per the [facility], he can have his INR done at the [facility] lab.  An appointment has been put in for 9:30 on 12/30/14.  [name] is aware of the appointment time.

## 2014-12-29 ENCOUNTER — Ambulatory Visit
Admission: RE | Admit: 2014-12-29 | Discharge: 2014-12-29 | Disposition: A | Discharge status: Home / Self Care | Payer: Medicare Other | Source: Ambulatory Visit | Attending: Radiation Oncology | Admitting: Radiation Oncology

## 2014-12-29 DIAGNOSIS — Z7901 Long term (current) use of anticoagulants: Secondary | ICD-10-CM | POA: Diagnosis not present

## 2014-12-29 DIAGNOSIS — Z510 Encounter for antineoplastic radiation therapy: Secondary | ICD-10-CM | POA: Diagnosis not present

## 2014-12-29 DIAGNOSIS — I10 Essential (primary) hypertension: Secondary | ICD-10-CM | POA: Diagnosis not present

## 2014-12-29 DIAGNOSIS — C07 Malignant neoplasm of parotid gland: Secondary | ICD-10-CM | POA: Diagnosis not present

## 2014-12-29 DIAGNOSIS — F419 Anxiety disorder, unspecified: Secondary | ICD-10-CM | POA: Diagnosis not present

## 2014-12-29 DIAGNOSIS — N400 Enlarged prostate without lower urinary tract symptoms: Secondary | ICD-10-CM | POA: Diagnosis not present

## 2014-12-29 DIAGNOSIS — I255 Ischemic cardiomyopathy: Secondary | ICD-10-CM | POA: Diagnosis not present

## 2014-12-29 DIAGNOSIS — I2510 Atherosclerotic heart disease of native coronary artery without angina pectoris: Secondary | ICD-10-CM | POA: Diagnosis not present

## 2014-12-29 DIAGNOSIS — H409 Unspecified glaucoma: Secondary | ICD-10-CM | POA: Diagnosis not present

## 2014-12-29 DIAGNOSIS — Z79891 Long term (current) use of opiate analgesic: Secondary | ICD-10-CM | POA: Diagnosis not present

## 2014-12-29 DIAGNOSIS — I4891 Unspecified atrial fibrillation: Secondary | ICD-10-CM | POA: Diagnosis not present

## 2014-12-29 DIAGNOSIS — Z87891 Personal history of nicotine dependence: Secondary | ICD-10-CM | POA: Diagnosis not present

## 2014-12-29 DIAGNOSIS — Z951 Presence of aortocoronary bypass graft: Secondary | ICD-10-CM | POA: Diagnosis not present

## 2014-12-29 DIAGNOSIS — Z950 Presence of cardiac pacemaker: Secondary | ICD-10-CM | POA: Diagnosis not present

## 2014-12-29 DIAGNOSIS — E780 Pure hypercholesterolemia: Secondary | ICD-10-CM | POA: Diagnosis not present

## 2014-12-30 ENCOUNTER — Ambulatory Visit
Admission: RE | Admit: 2014-12-30 | Discharge: 2014-12-30 | Disposition: A | Discharge status: Home / Self Care | Payer: Medicare Other | Source: Ambulatory Visit | Attending: Radiation Oncology | Admitting: Radiation Oncology

## 2014-12-30 ENCOUNTER — Encounter: Payer: Self-pay | Admitting: Radiation Oncology

## 2014-12-30 ENCOUNTER — Telehealth: Payer: Self-pay | Admitting: Oncology

## 2014-12-30 VITALS — BP 117/54 | HR 77 | Temp 97.3°F | Resp 16 | Ht 71.0 in | Wt 176.0 lb

## 2014-12-30 DIAGNOSIS — I255 Ischemic cardiomyopathy: Secondary | ICD-10-CM | POA: Diagnosis not present

## 2014-12-30 DIAGNOSIS — I4891 Unspecified atrial fibrillation: Secondary | ICD-10-CM | POA: Diagnosis not present

## 2014-12-30 DIAGNOSIS — C07 Malignant neoplasm of parotid gland: Secondary | ICD-10-CM | POA: Diagnosis not present

## 2014-12-30 DIAGNOSIS — Z5181 Encounter for therapeutic drug level monitoring: Secondary | ICD-10-CM | POA: Diagnosis not present

## 2014-12-30 DIAGNOSIS — Z951 Presence of aortocoronary bypass graft: Secondary | ICD-10-CM | POA: Diagnosis not present

## 2014-12-30 DIAGNOSIS — Z7901 Long term (current) use of anticoagulants: Secondary | ICD-10-CM | POA: Diagnosis not present

## 2014-12-30 DIAGNOSIS — Z87891 Personal history of nicotine dependence: Secondary | ICD-10-CM | POA: Diagnosis not present

## 2014-12-30 DIAGNOSIS — E780 Pure hypercholesterolemia: Secondary | ICD-10-CM | POA: Diagnosis not present

## 2014-12-30 DIAGNOSIS — N400 Enlarged prostate without lower urinary tract symptoms: Secondary | ICD-10-CM | POA: Diagnosis not present

## 2014-12-30 DIAGNOSIS — H409 Unspecified glaucoma: Secondary | ICD-10-CM | POA: Diagnosis not present

## 2014-12-30 DIAGNOSIS — Z950 Presence of cardiac pacemaker: Secondary | ICD-10-CM | POA: Diagnosis not present

## 2014-12-30 DIAGNOSIS — Z510 Encounter for antineoplastic radiation therapy: Secondary | ICD-10-CM | POA: Diagnosis not present

## 2014-12-30 DIAGNOSIS — F419 Anxiety disorder, unspecified: Secondary | ICD-10-CM | POA: Diagnosis not present

## 2014-12-30 DIAGNOSIS — Z79891 Long term (current) use of opiate analgesic: Secondary | ICD-10-CM | POA: Diagnosis not present

## 2014-12-30 DIAGNOSIS — I10 Essential (primary) hypertension: Secondary | ICD-10-CM | POA: Diagnosis not present

## 2014-12-30 DIAGNOSIS — I2510 Atherosclerotic heart disease of native coronary artery without angina pectoris: Secondary | ICD-10-CM | POA: Diagnosis not present

## 2014-12-30 LAB — PROTIME-INR: INR: 2.7 — AB (ref <=1.1)

## 2014-12-30 NOTE — Progress Notes (Signed)
Monitored Mr. Baldinger during radiation treatment.  He was 100% ventricular paced.  His heart rate ranged between 70-101 during treatment.

## 2014-12-30 NOTE — Progress Notes (Signed)
Ricky [name] has completed 24 fractions to his right neck.  He denies pain and a sore throat.  He does report pain in his right gums when chewing.  He denies trouble swallowing but his wife reports he is not eating and having trouble swallowing.  He is drinking 1 boost per day and eating some applesauce.  He reports his taste buds have changed and nothing tastes good.  He has lost 7 lbs since 12/16/14.  Orthostatic vitals done: bp sitting 132/60, hr 77, bp standing 117/54, hr 77.  He denies fatigue. The skin on his right face and neck is red and peeling with scattered scabbed areas.  He is using biafine and neosporin on the irritated areas.  BP 117/54 mmHg  Pulse 77  Temp(Src) 97.3 F (36.3 C) (Oral)  Resp 16  Ht 5\' 11"  (1.803 m)  Wt 176 lb (79.833 kg)  BMI 24.56 kg/m2  SpO2 98%

## 2014-12-30 NOTE — Progress Notes (Signed)
Patient ekg 5 lead monitoring, paceing started  70bpm went up to 96 80% done rad tx and up to 101 at 95% treatment compleetd, back to 85 99% completed treatment,patient tolerated well no c/o 11:59 AM

## 2014-12-30 NOTE — Progress Notes (Signed)
  Radiation Oncology         [phone] ________________________________  Name: Ricky [name] MRN: [id number]  Date: 12/30/2014  DOB: [birth year]  Weekly Radiation Therapy Management  DIAGNOSIS: Poorly differentiated invasive carcinoma presenting in the right parotid gland, likely stage IV  Current Dose: 48 Gy     Planned Dose:  60 Gy  Narrative . . . . . . . . The patient presents for routine under treatment assessment.                                   The patient complains of soreness along his right oral cavity and gums on. This makes it uncomfortable for him to eat. He has also lost his appetite but knows the importance of forcing foods. He denies any pain along the right face or ear area                                 Set-up films were reviewed.                                 The chart was checked. Physical Findings. . .  height is 5\' 11"  (1.803 m) and weight is 176 lb (79.833 kg). His oral temperature is 97.3 F (36.3 C). His blood pressure is 117/54 and his pulse is 77. His respiration is 16 and oxygen saturation is 98%. . Approximately a 7 pound weight loss since February 9. The patient has some mucositis along the right oral cavity but no hemorrhagic mucositis. The right face and neck area shows erythema and dry desquamation but overall less erythema. The overall tumor mass is decreased nicely since my last exam.  No drainage noted from the right neck area. Impression . . . . . . . The patient is tolerating radiation. Plan . . . . . . . . . . . . Continue treatment as planned.  ________________________________   Blair Promise, PhD, MD

## 2014-12-30 NOTE — Telephone Encounter (Signed)
Per [name] in the [facility], it is Ok to send the PT/INR out for Ricky [name].  She asked to send the results as soon as they are available.

## 2014-12-31 ENCOUNTER — Telehealth: Payer: Self-pay

## 2014-12-31 ENCOUNTER — Ambulatory Visit: Payer: Medicare Other

## 2014-12-31 ENCOUNTER — Ambulatory Visit
Admission: RE | Admit: 2014-12-31 | Discharge: 2014-12-31 | Disposition: A | Discharge status: Home / Self Care | Payer: Medicare Other | Source: Ambulatory Visit | Attending: Radiation Oncology | Admitting: Radiation Oncology

## 2014-12-31 DIAGNOSIS — Z950 Presence of cardiac pacemaker: Secondary | ICD-10-CM | POA: Diagnosis not present

## 2014-12-31 DIAGNOSIS — I4891 Unspecified atrial fibrillation: Secondary | ICD-10-CM | POA: Diagnosis not present

## 2014-12-31 DIAGNOSIS — Z79891 Long term (current) use of opiate analgesic: Secondary | ICD-10-CM | POA: Diagnosis not present

## 2014-12-31 DIAGNOSIS — I10 Essential (primary) hypertension: Secondary | ICD-10-CM | POA: Diagnosis not present

## 2014-12-31 DIAGNOSIS — I255 Ischemic cardiomyopathy: Secondary | ICD-10-CM | POA: Diagnosis not present

## 2014-12-31 DIAGNOSIS — Z7901 Long term (current) use of anticoagulants: Secondary | ICD-10-CM | POA: Diagnosis not present

## 2014-12-31 DIAGNOSIS — N400 Enlarged prostate without lower urinary tract symptoms: Secondary | ICD-10-CM | POA: Diagnosis not present

## 2014-12-31 DIAGNOSIS — F419 Anxiety disorder, unspecified: Secondary | ICD-10-CM | POA: Diagnosis not present

## 2014-12-31 DIAGNOSIS — H409 Unspecified glaucoma: Secondary | ICD-10-CM | POA: Diagnosis not present

## 2014-12-31 DIAGNOSIS — Z87891 Personal history of nicotine dependence: Secondary | ICD-10-CM | POA: Diagnosis not present

## 2014-12-31 DIAGNOSIS — C07 Malignant neoplasm of parotid gland: Secondary | ICD-10-CM | POA: Diagnosis not present

## 2014-12-31 DIAGNOSIS — E780 Pure hypercholesterolemia: Secondary | ICD-10-CM | POA: Diagnosis not present

## 2014-12-31 DIAGNOSIS — Z510 Encounter for antineoplastic radiation therapy: Secondary | ICD-10-CM | POA: Diagnosis not present

## 2014-12-31 DIAGNOSIS — I2510 Atherosclerotic heart disease of native coronary artery without angina pectoris: Secondary | ICD-10-CM | POA: Diagnosis not present

## 2014-12-31 DIAGNOSIS — Z951 Presence of aortocoronary bypass graft: Secondary | ICD-10-CM | POA: Diagnosis not present

## 2014-12-31 NOTE — Progress Notes (Signed)
Monitored patient while receiving radiation therapy today.  NSR with evidence of pacemaker spikes during treatment rate 70-80 beats/min.  No voiced concerns.  Pacemaker check today via [place] rep.

## 2014-12-31 NOTE — Telephone Encounter (Signed)
Voice message left for [facility] [phone]).INR  2.68 and PT 28.5 collected on 12/30/14.May call tomorrow to provide fax number to fax over results.

## 2015-01-01 ENCOUNTER — Ambulatory Visit: Payer: Medicare Other

## 2015-01-01 ENCOUNTER — Ambulatory Visit (INDEPENDENT_AMBULATORY_CARE_PROVIDER_SITE_OTHER): Payer: Medicare Other | Admitting: Cardiovascular Disease

## 2015-01-01 ENCOUNTER — Ambulatory Visit
Admission: RE | Admit: 2015-01-01 | Discharge: 2015-01-01 | Disposition: A | Discharge status: Home / Self Care | Payer: Medicare Other | Source: Ambulatory Visit | Attending: Radiation Oncology | Admitting: Radiation Oncology

## 2015-01-01 DIAGNOSIS — F419 Anxiety disorder, unspecified: Secondary | ICD-10-CM | POA: Diagnosis not present

## 2015-01-01 DIAGNOSIS — I2510 Atherosclerotic heart disease of native coronary artery without angina pectoris: Secondary | ICD-10-CM | POA: Diagnosis not present

## 2015-01-01 DIAGNOSIS — H409 Unspecified glaucoma: Secondary | ICD-10-CM | POA: Diagnosis not present

## 2015-01-01 DIAGNOSIS — I255 Ischemic cardiomyopathy: Secondary | ICD-10-CM | POA: Diagnosis not present

## 2015-01-01 DIAGNOSIS — E780 Pure hypercholesterolemia: Secondary | ICD-10-CM | POA: Diagnosis not present

## 2015-01-01 DIAGNOSIS — I4891 Unspecified atrial fibrillation: Secondary | ICD-10-CM | POA: Diagnosis not present

## 2015-01-01 DIAGNOSIS — Z951 Presence of aortocoronary bypass graft: Secondary | ICD-10-CM | POA: Diagnosis not present

## 2015-01-01 DIAGNOSIS — Z950 Presence of cardiac pacemaker: Secondary | ICD-10-CM | POA: Diagnosis not present

## 2015-01-01 DIAGNOSIS — I10 Essential (primary) hypertension: Secondary | ICD-10-CM | POA: Diagnosis not present

## 2015-01-01 DIAGNOSIS — N400 Enlarged prostate without lower urinary tract symptoms: Secondary | ICD-10-CM | POA: Diagnosis not present

## 2015-01-01 DIAGNOSIS — Z7901 Long term (current) use of anticoagulants: Secondary | ICD-10-CM | POA: Diagnosis not present

## 2015-01-01 DIAGNOSIS — Z5181 Encounter for therapeutic drug level monitoring: Secondary | ICD-10-CM

## 2015-01-01 DIAGNOSIS — Z79891 Long term (current) use of opiate analgesic: Secondary | ICD-10-CM | POA: Diagnosis not present

## 2015-01-01 DIAGNOSIS — C07 Malignant neoplasm of parotid gland: Secondary | ICD-10-CM | POA: Diagnosis not present

## 2015-01-01 DIAGNOSIS — Z87891 Personal history of nicotine dependence: Secondary | ICD-10-CM | POA: Diagnosis not present

## 2015-01-01 DIAGNOSIS — Z510 Encounter for antineoplastic radiation therapy: Secondary | ICD-10-CM | POA: Diagnosis not present

## 2015-01-01 NOTE — Progress Notes (Signed)
Monitored patient with 5 lead EKG  While receiving radiation therapy today, NSR  pacing 70-85 bpm  , no concerns voiced, patient tolerated well  10:29 AM

## 2015-01-02 ENCOUNTER — Ambulatory Visit
Admission: RE | Admit: 2015-01-02 | Discharge: 2015-01-02 | Disposition: A | Discharge status: Home / Self Care | Payer: Medicare Other | Source: Ambulatory Visit | Attending: Radiation Oncology | Admitting: Radiation Oncology

## 2015-01-02 ENCOUNTER — Ambulatory Visit: Payer: Medicare Other | Admitting: Nutrition

## 2015-01-02 DIAGNOSIS — Z79891 Long term (current) use of opiate analgesic: Secondary | ICD-10-CM | POA: Diagnosis not present

## 2015-01-02 DIAGNOSIS — I4891 Unspecified atrial fibrillation: Secondary | ICD-10-CM | POA: Diagnosis not present

## 2015-01-02 DIAGNOSIS — N400 Enlarged prostate without lower urinary tract symptoms: Secondary | ICD-10-CM | POA: Diagnosis not present

## 2015-01-02 DIAGNOSIS — I255 Ischemic cardiomyopathy: Secondary | ICD-10-CM | POA: Diagnosis not present

## 2015-01-02 DIAGNOSIS — Z510 Encounter for antineoplastic radiation therapy: Secondary | ICD-10-CM | POA: Diagnosis not present

## 2015-01-02 DIAGNOSIS — Z950 Presence of cardiac pacemaker: Secondary | ICD-10-CM | POA: Diagnosis not present

## 2015-01-02 DIAGNOSIS — H409 Unspecified glaucoma: Secondary | ICD-10-CM | POA: Diagnosis not present

## 2015-01-02 DIAGNOSIS — F419 Anxiety disorder, unspecified: Secondary | ICD-10-CM | POA: Diagnosis not present

## 2015-01-02 DIAGNOSIS — Z951 Presence of aortocoronary bypass graft: Secondary | ICD-10-CM | POA: Diagnosis not present

## 2015-01-02 DIAGNOSIS — E780 Pure hypercholesterolemia: Secondary | ICD-10-CM | POA: Diagnosis not present

## 2015-01-02 DIAGNOSIS — Z7901 Long term (current) use of anticoagulants: Secondary | ICD-10-CM | POA: Diagnosis not present

## 2015-01-02 DIAGNOSIS — C07 Malignant neoplasm of parotid gland: Secondary | ICD-10-CM | POA: Diagnosis not present

## 2015-01-02 DIAGNOSIS — I10 Essential (primary) hypertension: Secondary | ICD-10-CM | POA: Diagnosis not present

## 2015-01-02 DIAGNOSIS — Z87891 Personal history of nicotine dependence: Secondary | ICD-10-CM | POA: Diagnosis not present

## 2015-01-02 DIAGNOSIS — I2510 Atherosclerotic heart disease of native coronary artery without angina pectoris: Secondary | ICD-10-CM | POA: Diagnosis not present

## 2015-01-02 NOTE — Progress Notes (Signed)
Nutrition follow-up completed with patient over the telephone. Patient has lost approximately 10 pounds over the last 3 weeks. Weight documented as 176 pounds February 23, down from 185.5 pounds February 2. Wife reports patient refuses food that is offered throughout the day. Patient also is refusing oral nutrition supplements. Educated patient on the importance of consuming adequate calories and protein for healing.   Patient states he will try to drink two oral nutrition supplements daily.  Offered to mail coupons.  However, wife declined. Will continue to follow patient as needed.  **Disclaimer: This note was dictated with voice recognition software. Similar sounding words can inadvertently be transcribed and this note may contain transcription errors which may not have been corrected upon publication of note.**

## 2015-01-02 NOTE — Progress Notes (Signed)
Monitored Mr. Anastacio during radiation treatment.  He was 100% ventricular paced with occasional atrial pacing.  His heart rate ranged from 70-93.

## 2015-01-05 ENCOUNTER — Ambulatory Visit
Admission: RE | Admit: 2015-01-05 | Discharge: 2015-01-05 | Disposition: A | Discharge status: Home / Self Care | Payer: Medicare Other | Source: Ambulatory Visit | Attending: Radiation Oncology | Admitting: Radiation Oncology

## 2015-01-05 ENCOUNTER — Telehealth: Payer: Self-pay | Admitting: Oncology

## 2015-01-05 DIAGNOSIS — H409 Unspecified glaucoma: Secondary | ICD-10-CM | POA: Diagnosis not present

## 2015-01-05 DIAGNOSIS — I255 Ischemic cardiomyopathy: Secondary | ICD-10-CM | POA: Diagnosis not present

## 2015-01-05 DIAGNOSIS — Z7901 Long term (current) use of anticoagulants: Secondary | ICD-10-CM | POA: Diagnosis not present

## 2015-01-05 DIAGNOSIS — Z79891 Long term (current) use of opiate analgesic: Secondary | ICD-10-CM | POA: Diagnosis not present

## 2015-01-05 DIAGNOSIS — Z510 Encounter for antineoplastic radiation therapy: Secondary | ICD-10-CM | POA: Diagnosis not present

## 2015-01-05 DIAGNOSIS — Z951 Presence of aortocoronary bypass graft: Secondary | ICD-10-CM | POA: Diagnosis not present

## 2015-01-05 DIAGNOSIS — I10 Essential (primary) hypertension: Secondary | ICD-10-CM | POA: Diagnosis not present

## 2015-01-05 DIAGNOSIS — I2510 Atherosclerotic heart disease of native coronary artery without angina pectoris: Secondary | ICD-10-CM | POA: Diagnosis not present

## 2015-01-05 DIAGNOSIS — Z950 Presence of cardiac pacemaker: Secondary | ICD-10-CM | POA: Diagnosis not present

## 2015-01-05 DIAGNOSIS — Z87891 Personal history of nicotine dependence: Secondary | ICD-10-CM | POA: Diagnosis not present

## 2015-01-05 DIAGNOSIS — C07 Malignant neoplasm of parotid gland: Secondary | ICD-10-CM | POA: Diagnosis not present

## 2015-01-05 DIAGNOSIS — E780 Pure hypercholesterolemia: Secondary | ICD-10-CM | POA: Diagnosis not present

## 2015-01-05 DIAGNOSIS — N400 Enlarged prostate without lower urinary tract symptoms: Secondary | ICD-10-CM | POA: Diagnosis not present

## 2015-01-05 DIAGNOSIS — I4891 Unspecified atrial fibrillation: Secondary | ICD-10-CM | POA: Diagnosis not present

## 2015-01-05 DIAGNOSIS — F419 Anxiety disorder, unspecified: Secondary | ICD-10-CM | POA: Diagnosis not present

## 2015-01-05 NOTE — Telephone Encounter (Signed)
Called Ricky [name] at [facility] to arrange a pacemaker check on Ricky [name]'s last day of radiation, 3/2 at 9:40 am.

## 2015-01-05 NOTE — Progress Notes (Signed)
Monitored Ricky [name] during radiation treatment.  He was 100% ventricular paced with a heart rate that ranged between 70-101.

## 2015-01-06 ENCOUNTER — Ambulatory Visit
Admission: RE | Admit: 2015-01-06 | Discharge: 2015-01-06 | Disposition: A | Discharge status: Home / Self Care | Payer: Medicare Other | Source: Ambulatory Visit | Attending: Radiation Oncology | Admitting: Radiation Oncology

## 2015-01-06 ENCOUNTER — Encounter: Payer: Self-pay | Admitting: Radiation Oncology

## 2015-01-06 ENCOUNTER — Ambulatory Visit: Payer: Medicare Other

## 2015-01-06 VITALS — BP 127/53 | HR 70 | Temp 98.7°F | Resp 16 | Ht 71.0 in | Wt 176.0 lb

## 2015-01-06 DIAGNOSIS — I4891 Unspecified atrial fibrillation: Secondary | ICD-10-CM | POA: Diagnosis not present

## 2015-01-06 DIAGNOSIS — C07 Malignant neoplasm of parotid gland: Secondary | ICD-10-CM

## 2015-01-06 DIAGNOSIS — Z7901 Long term (current) use of anticoagulants: Secondary | ICD-10-CM | POA: Diagnosis not present

## 2015-01-06 DIAGNOSIS — I2510 Atherosclerotic heart disease of native coronary artery without angina pectoris: Secondary | ICD-10-CM | POA: Diagnosis not present

## 2015-01-06 DIAGNOSIS — Z87891 Personal history of nicotine dependence: Secondary | ICD-10-CM | POA: Diagnosis not present

## 2015-01-06 DIAGNOSIS — F419 Anxiety disorder, unspecified: Secondary | ICD-10-CM | POA: Diagnosis not present

## 2015-01-06 DIAGNOSIS — Z79891 Long term (current) use of opiate analgesic: Secondary | ICD-10-CM | POA: Diagnosis not present

## 2015-01-06 DIAGNOSIS — I10 Essential (primary) hypertension: Secondary | ICD-10-CM | POA: Diagnosis not present

## 2015-01-06 DIAGNOSIS — Z510 Encounter for antineoplastic radiation therapy: Secondary | ICD-10-CM | POA: Diagnosis not present

## 2015-01-06 DIAGNOSIS — N400 Enlarged prostate without lower urinary tract symptoms: Secondary | ICD-10-CM | POA: Diagnosis not present

## 2015-01-06 DIAGNOSIS — I255 Ischemic cardiomyopathy: Secondary | ICD-10-CM | POA: Diagnosis not present

## 2015-01-06 DIAGNOSIS — Z950 Presence of cardiac pacemaker: Secondary | ICD-10-CM | POA: Diagnosis not present

## 2015-01-06 DIAGNOSIS — H409 Unspecified glaucoma: Secondary | ICD-10-CM | POA: Diagnosis not present

## 2015-01-06 DIAGNOSIS — E780 Pure hypercholesterolemia: Secondary | ICD-10-CM | POA: Diagnosis not present

## 2015-01-06 DIAGNOSIS — Z951 Presence of aortocoronary bypass graft: Secondary | ICD-10-CM | POA: Diagnosis not present

## 2015-01-06 NOTE — Progress Notes (Signed)
5 Lead EKG monitoring while getting 29th rad tx, paced between 78-101 from [id number], patient tolerted well,  11:00 AM

## 2015-01-06 NOTE — Progress Notes (Signed)
Sherald Hess has completed 29 fractions to his right neck.  He denies pain and trouble swallowing.  He denies having a dry mouth.  His lips appear dry and chapped.  He reports his gums continue to hurt when eating on his right side.  He reports only eating small amounts of solid foods.  He is drinking 1 boost per day.  Advised him to increase this to 2 a day.  His weight is stable from last week. The skin on his right face/neck is red with scabbed areas.  The swelling in his right check and the back of his right neck as improved.  He is using biafine.  He reports fatigue.  He has been given a one month follow up appointment card.  BP 127/53 mmHg  Pulse 70  Temp(Src) 98.7 F (37.1 C) (Oral)  Resp 16  Ht 5\' 11"  (1.803 m)  Wt 176 lb (79.833 kg)  BMI 24.56 kg/m2  SpO2 97%

## 2015-01-06 NOTE — Progress Notes (Signed)
  Radiation Oncology         ([phone] ________________________________  Name: Ricky [name] MRN: [id number]  Date: 01/06/2015  DOB: [birth year]  Weekly Radiation Therapy Management  DIAGNOSIS: Poorly differentiated invasive carcinoma presenting in the right parotid gland, likely stage IV   Current Dose: 58 Gy     Planned Dose:  60 Gy  Narrative . . . . . . . . The patient presents for routine under treatment assessment.                                   The patient continues to have a poor taste and soreness along the right oral cavity. He denies any pain along the right face or ear area. He continues to have hearing difficulties out of the right ear.                                 Set-up films were reviewed.                                 The chart was checked. Physical Findings. . .  height is 5\' 11"  (1.803 m) and weight is 176 lb (79.833 kg). His oral temperature is 98.7 F (37.1 C). His blood pressure is 127/53 and his pulse is 70. His respiration is 16 and oxygen saturation is 97%. . There is less erythema along the right neck. Dry desquamation present but no moist desquamation. Overall the tumor mass has decreased in size. The oral cavity is moist without secondary infection. Some mucositis along the right buccal mucosa region. 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 . . . . . . The patient is tolerating radiation. Plan . . . . . . . . . . . . Continue treatment as planned.  ________________________________   Blair Promise, PhD, MD

## 2015-01-07 ENCOUNTER — Encounter: Payer: Self-pay | Admitting: *Deleted

## 2015-01-07 ENCOUNTER — Ambulatory Visit
Admission: RE | Admit: 2015-01-07 | Discharge: 2015-01-07 | Disposition: A | Discharge status: Home / Self Care | Payer: Medicare Other | Source: Ambulatory Visit | Attending: Radiation Oncology | Admitting: Radiation Oncology

## 2015-01-07 ENCOUNTER — Ambulatory Visit: Payer: Medicare Other

## 2015-01-07 DIAGNOSIS — I10 Essential (primary) hypertension: Secondary | ICD-10-CM | POA: Diagnosis not present

## 2015-01-07 DIAGNOSIS — N400 Enlarged prostate without lower urinary tract symptoms: Secondary | ICD-10-CM | POA: Diagnosis not present

## 2015-01-07 DIAGNOSIS — Z950 Presence of cardiac pacemaker: Secondary | ICD-10-CM | POA: Diagnosis not present

## 2015-01-07 DIAGNOSIS — C07 Malignant neoplasm of parotid gland: Secondary | ICD-10-CM | POA: Diagnosis not present

## 2015-01-07 DIAGNOSIS — H409 Unspecified glaucoma: Secondary | ICD-10-CM | POA: Diagnosis not present

## 2015-01-07 DIAGNOSIS — Z510 Encounter for antineoplastic radiation therapy: Secondary | ICD-10-CM | POA: Diagnosis not present

## 2015-01-07 DIAGNOSIS — Z79891 Long term (current) use of opiate analgesic: Secondary | ICD-10-CM | POA: Diagnosis not present

## 2015-01-07 DIAGNOSIS — F419 Anxiety disorder, unspecified: Secondary | ICD-10-CM | POA: Diagnosis not present

## 2015-01-07 DIAGNOSIS — Z951 Presence of aortocoronary bypass graft: Secondary | ICD-10-CM | POA: Diagnosis not present

## 2015-01-07 DIAGNOSIS — Z7901 Long term (current) use of anticoagulants: Secondary | ICD-10-CM | POA: Diagnosis not present

## 2015-01-07 DIAGNOSIS — E780 Pure hypercholesterolemia: Secondary | ICD-10-CM | POA: Diagnosis not present

## 2015-01-07 DIAGNOSIS — I255 Ischemic cardiomyopathy: Secondary | ICD-10-CM | POA: Diagnosis not present

## 2015-01-07 DIAGNOSIS — I4891 Unspecified atrial fibrillation: Secondary | ICD-10-CM | POA: Diagnosis not present

## 2015-01-07 DIAGNOSIS — Z87891 Personal history of nicotine dependence: Secondary | ICD-10-CM | POA: Diagnosis not present

## 2015-01-07 DIAGNOSIS — I2510 Atherosclerotic heart disease of native coronary artery without angina pectoris: Secondary | ICD-10-CM | POA: Diagnosis not present
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
## 2015-01-07 NOTE — Progress Notes (Signed)
5 Lead EKG monitoring while receiving final rad tx, paced between 70-93 from 0913am-0955am, patient tolerted well.

## 2015-01-09 ENCOUNTER — Encounter: Payer: Self-pay | Admitting: *Deleted
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
## 2015-01-12 ENCOUNTER — Telehealth: Payer: Self-pay | Admitting: *Deleted

## 2015-01-12 NOTE — Telephone Encounter (Signed)
There will be no need unless he wants to meet with me. I leave it up to him.

## 2015-01-12 NOTE — Telephone Encounter (Signed)
Patient's wife called to inquire the reason for this Wednesday morning's appt with Dr. Alen Blew.  Patient indicated "if it's about having chemotherapy I'm not coming.  I'm not having it"; they wish to cancel appt if r/t to chemotherapy.  I indicated I would check and get back with them.  Gayleen Orem, RN, BSN, [place] at [place] [phone]

## 2015-01-13 ENCOUNTER — Telehealth: Payer: Self-pay | Admitting: *Deleted

## 2015-01-13 DIAGNOSIS — R031 Nonspecific low blood-pressure reading: Secondary | ICD-10-CM | POA: Diagnosis not present

## 2015-01-13 NOTE — Telephone Encounter (Signed)
Spoke with patient wife, indicated that per Dr. Alen Blew, it is up to patient whether he wants to keep tomorrow's appt.   Wife confirmed that they do not want appt, requested that I facilitate cancellation.  Dr. Alen Blew notified.  Gayleen Orem, RN, BSN, [place] at [place] [phone]

## 2015-01-14 ENCOUNTER — Ambulatory Visit: Payer: Medicare Other | Admitting: Oncology

## 2015-01-19 ENCOUNTER — Encounter: Payer: Self-pay | Admitting: Internal Medicine

## 2015-01-20 ENCOUNTER — Ambulatory Visit (INDEPENDENT_AMBULATORY_CARE_PROVIDER_SITE_OTHER): Payer: Medicare Other | Admitting: *Deleted

## 2015-01-20 ENCOUNTER — Encounter: Payer: Self-pay | Admitting: Neurology

## 2015-01-20 ENCOUNTER — Ambulatory Visit (INDEPENDENT_AMBULATORY_CARE_PROVIDER_SITE_OTHER): Payer: Medicare Other | Admitting: Neurology

## 2015-01-20 VITALS — BP 103/65 | HR 76 | Resp 15 | Ht 72.0 in | Wt 171.2 lb

## 2015-01-20 DIAGNOSIS — Z5181 Encounter for therapeutic drug level monitoring: Secondary | ICD-10-CM

## 2015-01-20 DIAGNOSIS — I679 Cerebrovascular disease, unspecified: Secondary | ICD-10-CM

## 2015-01-20 DIAGNOSIS — F0281 Dementia in other diseases classified elsewhere with behavioral disturbance: Secondary | ICD-10-CM

## 2015-01-20 DIAGNOSIS — G3183 Dementia with Lewy bodies: Secondary | ICD-10-CM

## 2015-01-20 DIAGNOSIS — I4891 Unspecified atrial fibrillation: Secondary | ICD-10-CM | POA: Diagnosis not present

## 2015-01-20 DIAGNOSIS — G3109 Other frontotemporal dementia: Secondary | ICD-10-CM | POA: Diagnosis not present

## 2015-01-20 DIAGNOSIS — F0280 Dementia in other diseases classified elsewhere without behavioral disturbance: Secondary | ICD-10-CM

## 2015-01-20 DIAGNOSIS — Z7901 Long term (current) use of anticoagulants: Secondary | ICD-10-CM

## 2015-01-20 LAB — POCT INR: INR: 3.1

## 2015-01-20 MED ORDER — QUETIAPINE FUMARATE 50 MG PO TABS: 100.0000 mg | ORAL_TABLET | Freq: Every day | ORAL | Status: DC

## 2015-01-20 MED ORDER — DONEPEZIL HCL 10 MG PO TABS
ORAL_TABLET | ORAL | Status: DC
Start: 2015-01-20 — End: 2015-05-09

## 2015-01-20 MED ORDER — MEMANTINE HCL 10 MG PO TABS: 10.0000 mg | ORAL_TABLET | Freq: Two times a day (BID) | ORAL | Status: AC

## 2015-01-20 NOTE — Progress Notes (Addendum)
[facility]  Provider:  Larey [name], M D  Referring Provider: [name], MD Primary Care Physician:  Ricky Grant, MD  Chief Complaint  Patient presents with  . RV Dementia    Rm 59, wife    HPI:  Ricky [name] is a 81 y.o. male  Is seen here as a revisit  from Dr. Asa [name] for dementia.  Patient has last been seen by Ricky [name],  on 01-17-14.  The patient remains independent in dressing is feeding himself his back leaving himself. On Seroquel he is sleeping better and has less agitation. He has been" ugly" to his wife, he has been very vocal and disinhibited. He is easily agitated, aggressive and impulsive.  This geriatric depression score today was endorsed at 4 points, a Montral cognitive assessment test was performed. The patient scored on this memory test 16 points out of 30 possible points. The patient and his wife report no hallucination, neither visual nor auditory. He has no REM BD, sleeps deep and long. He has no trouble to go to the [name], has not wandered around the house.   His  appetite is stable and so is his sleep.  He is known to have untreated OSA, but his wife is not longer able to convince him to take medication, therapy etc. She seems afraid of her husband.  She is tearful, and the loss of their daughter has further depressed the two.   Interval history 01-20-15  The patient is a regular patient at [name]. 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 It is also pulled the lower eyelids down. His right eye is affected , the eye movements are abnormal.  the eye has moved upward. He has also a puffiness over the right eyebrow and right  retroauricular.  It seems that the tumor has spread under the skin.  Ricky [name] is followed here for dementia the last time he was seen a [name] he scored 18 out of 19 points on the Mini-Mental test at geriatric  depression score was endorsed at 4 points, and the patient had at the time denied any difficulties with sleep or progressive memory loss. Here today he scored again on a Mini-Mental Status Examination 19 out of 30 points. His short-term memory for the 3 words to recall is the worst affected. He could not do the serial sevens and he was not sure about the day of the week. A clock drawing test was unsuccessful, he could name 6 animals he was able to review his medical list of medications. The patient will continue with Aricept and Namenda and I will refill his Seroquel 50 mg at night. He has been sleeping well with it and wants not to wean.    Review of Systems: Out of a complete 14 system review, the patient complains of only the following symptoms, and all other reviewed systems are negative. " hateful, ugly". Disinhibited. - he told his wife he wished she had the cancer, not him.  He appears frontal disinhibited.  He should not have any guns or ammunition in the home.  " The patients daughter died on 63- 32-14 .  History   Social History  . Marital Status: Married    Spouse Name: Ricky [name]  . Number of Children: 1  . Years of Education: N/A   Occupational History  . retired   .  Social History Main Topics  . Smoking status: Former Smoker -- 1.50 packs/day for 50 years    Types: Cigarettes    Quit date: 11/08/1995  . Smokeless tobacco: Never Used  . Alcohol Use: No     Comment: quit drinking back in 1977.  . Drug Use: No  . Sexual Activity: Not Currently   Other Topics Concern  . Not on file   Social History Narrative   Married, wife Ricky [name], 14yrs   1 daughter from 1st marriage who lives nearby   Son died May 04, 2010 in tractor accident.    Family History  Problem Relation Age of Onset  . Coronary artery disease Other     Past Medical History  Diagnosis Date  . Obstructive sleep apnea     noncompliant with CPAP  . Ischemic cardiomyopathy   . Atrial fibrillation     on  anticoag  . AV block, complete s/p ablation   . Pacemaker [name]   . Cerebrovascular disease   . Hypercholesterolemia   . Hiatal hernia   . Gastritis   . Diverticulosis   . Colonic polyp   . BPH (benign prostatic hypertrophy)   . DJD (degenerative joint disease)   . Memory loss   . Anxiety   . Anemia   . Shingles   . Carotid artery occlusion     s/p R CEA 08/2003, s/p L CEA 06/2013  . Hypertension   . Pneumonia     hx of  . Parkinsonian features   . CAD (coronary artery disease) 2000    s/p CABG x 4 07/1999  . Glaucoma   . Cancer of parotid gland 09/2014 approx    right    Past Surgical History  Procedure Laterality Date  . Pacemaker insertion    . Coronary artery bypass graft  07/1999    x4 by Dr. Jobie [name] 9/00  . Carotid endarterectomy Right 08/2003    right 10/04 Dr. Scot [name]  . Anal fissure repair    . Hiatal hernia repair      incarcerated stomach with takedown and repair of hiatus and gastropexy  . Insert / replace / remove pacemaker    . Endarterectomy Left 06/27/2013    Procedure: Left Carotid Endarterectomy with Finesse patch angioplasty;  Surgeon: Angelia Mould, MD;  Location: [name];  Service: Vascular;  Laterality: Left;  . Hernia repair      Current Outpatient Prescriptions  Medication Sig Dispense Refill  . acetaminophen (TYLENOL) 500 MG tablet Take 500 mg by mouth every 6 (six) hours as needed for pain.     . brimonidine-timolol (COMBIGAN) 0.2-0.5 % ophthalmic solution Place 1 drop into both eyes every 12 (twelve) hours.    Marland Kitchen dextromethorphan-guaiFENesin (MUCINEX DM) 30-600 MG per 12 hr tablet Take 1 tablet by mouth 2 (two) times daily as needed.     . docusate sodium (COLACE) 100 MG capsule Take 1 capsule (100 mg total) by mouth every 12 (twelve) hours. 60 capsule 0  . donepezil (ARICEPT) 10 MG tablet TAKE  ONE TABLET BY MOUTH EVERY MORNING 30 tablet 5  . emollient (BIAFINE) cream Apply topically as needed.    . finasteride (PROSCAR) 5 MG  tablet Take 5 mg by mouth every morning.     Marland Kitchen HYDROcodone-acetaminophen (NORCO/VICODIN) 5-325 MG per tablet     . latanoprost (XALATAN) 0.005 % ophthalmic solution Place 1 drop into both eyes daily.     Marland Kitchen lisinopril (PRINIVIL,ZESTRIL) 20 MG tablet TAKE 1 TABLET (20  MG TOTAL) BY MOUTH DAILY. 30 tablet 5  . memantine (NAMENDA) 10 MG tablet Take 1 tablet (10 mg total) by mouth 2 (two) times daily. 180 tablet 3  . QUEtiapine (SEROQUEL) 50 MG tablet TAKE TWO TABLETS BY MOUTH DAILY 60 tablet 3  . simvastatin (ZOCOR) 40 MG tablet TAKE ONE TABLET BY MOUTH AT BEDTIME 30 tablet 5  . timolol (BETIMOL) 0.5 % ophthalmic solution     . traMADol (ULTRAM) 50 MG tablet TAKE ONE TABLET BY MOUTH THREE TIMES DAILY AS NEEDED 90 tablet 0  . warfarin (COUMADIN) 5 MG tablet TAKE AS INSTRUCTED BY ANTICOAGULATION CLINIC 35 tablet 1   No current facility-administered medications for this visit.    Allergies as of 01/20/2015  . (No Known Allergies)    Vitals: BP 103/65 mmHg  Pulse 76  Resp 15  Ht 6' (1.829 m)  Wt 171 lb 3.2 oz (77.656 kg)  BMI 23.21 kg/m2 Last Weight:  Wt Readings from Last 1 Encounters:  01/20/15 171 lb 3.2 oz (77.656 kg)   Last Height:   Ht Readings from Last 1 Encounters:  01/20/15 6' (1.829 m)    Physical exam:  General: The patient is awake, alert and appears not in acute distress. Head: Normocephalic, atraumatic. Neck is supple. Cardiovascular:  Regular rate and rhythm without  murmurs or carotid bruit, and without distended neck veins.Respiratory: Lungs are clear to auscultation. Skin:  disfiguered right face and postauricular swelling.  Facial erythema. Indentation over the left temple.  Trunk: BMI is elevated .  Neurologic exam : The patient is awake and alert, oriented to place and time.  Memory subjective further impaired -  he needs to be evaluated by MMSE :01-20-15 , 19-30 points  for continuity.  There is agitation, Speech is fluent.  .  Assessment:  After physical  and neurologic examination, review of laboratory studies, imaging, neurophysiology testing and pre-existing records, assessment is   1) dementia , not progressed.  2) desinhibition, anger, aggression- I have asked the spouse to remove the gun and ammunition from the home.   Dementia with frontal lobe dominance, according to the unreflective behaviors and verbal abuse his wife is exposed to. He has become more rigid on Seroquel,   Has some tremor,  Loss of arm swing. His gait is slow , but not shuffling.  He has likely a  subtype of dementia - Lewy body.   Plan:  Treatment plan and additional workup : Continue with seroquel and namenda.

## 2015-01-23 DIAGNOSIS — L851 Acquired keratosis [keratoderma] palmaris et plantaris: Secondary | ICD-10-CM | POA: Diagnosis not present

## 2015-01-23 DIAGNOSIS — I739 Peripheral vascular disease, unspecified: Secondary | ICD-10-CM | POA: Diagnosis not present

## 2015-01-23 DIAGNOSIS — B351 Tinea unguium: Secondary | ICD-10-CM | POA: Diagnosis not present

## 2015-02-02 ENCOUNTER — Encounter: Payer: Self-pay | Admitting: Internal Medicine

## 2015-02-02 DIAGNOSIS — Z950 Presence of cardiac pacemaker: Secondary | ICD-10-CM | POA: Diagnosis not present

## 2015-02-02 DIAGNOSIS — I4891 Unspecified atrial fibrillation: Secondary | ICD-10-CM | POA: Diagnosis not present

## 2015-02-10 ENCOUNTER — Ambulatory Visit (INDEPENDENT_AMBULATORY_CARE_PROVIDER_SITE_OTHER): Payer: Medicare Other | Admitting: *Deleted

## 2015-02-10 DIAGNOSIS — Z7901 Long term (current) use of anticoagulants: Secondary | ICD-10-CM

## 2015-02-10 DIAGNOSIS — I679 Cerebrovascular disease, unspecified: Secondary | ICD-10-CM | POA: Diagnosis not present

## 2015-02-10 DIAGNOSIS — Z5181 Encounter for therapeutic drug level monitoring: Secondary | ICD-10-CM

## 2015-02-10 DIAGNOSIS — I4891 Unspecified atrial fibrillation: Secondary | ICD-10-CM

## 2015-02-10 LAB — POCT INR: INR: 1.4

## 2015-02-12 ENCOUNTER — Ambulatory Visit (INDEPENDENT_AMBULATORY_CARE_PROVIDER_SITE_OTHER): Payer: Medicare Other | Admitting: Internal Medicine

## 2015-02-12 ENCOUNTER — Other Ambulatory Visit (INDEPENDENT_AMBULATORY_CARE_PROVIDER_SITE_OTHER): Payer: Medicare Other

## 2015-02-12 ENCOUNTER — Encounter: Payer: Self-pay | Admitting: Internal Medicine

## 2015-02-12 VITALS — BP 120/70 | HR 87 | Temp 98.2°F | Ht 72.0 in | Wt 170.5 lb

## 2015-02-12 DIAGNOSIS — E7800 Pure hypercholesterolemia, unspecified: Secondary | ICD-10-CM

## 2015-02-12 DIAGNOSIS — I10 Essential (primary) hypertension: Secondary | ICD-10-CM | POA: Diagnosis not present

## 2015-02-12 DIAGNOSIS — F0281 Dementia in other diseases classified elsewhere with behavioral disturbance: Secondary | ICD-10-CM

## 2015-02-12 DIAGNOSIS — C07 Malignant neoplasm of parotid gland: Secondary | ICD-10-CM | POA: Diagnosis not present

## 2015-02-12 DIAGNOSIS — F02818 Dementia in other diseases classified elsewhere, unspecified severity, with other behavioral disturbance: Secondary | ICD-10-CM

## 2015-02-12 DIAGNOSIS — Z0000 Encounter for general adult medical examination without abnormal findings: Secondary | ICD-10-CM | POA: Diagnosis not present

## 2015-02-12 LAB — LIPID PANEL
CHOL/HDL RATIO: 3
Cholesterol: 130 mg/dL (ref 0–200)
HDL: 44.6 mg/dL (ref >39.00)
LDL Cholesterol: 52 mg/dL (ref 0–99)
NonHDL: 85.4
Triglycerides: 165 mg/dL — ABNORMAL HIGH (ref 0.0–149.0)
VLDL: 33 mg/dL (ref 0.0–40.0)

## 2015-02-12 LAB — BASIC METABOLIC PANEL
BUN: 12 mg/dL (ref 6–23)
CHLORIDE: 104 meq/L (ref 96–112)
CO2: 30 meq/L (ref 19–32)
Calcium: 10 mg/dL (ref 8.4–10.5)
Creatinine, Ser: 1.24 mg/dL (ref 0.40–1.50)
GFR: 59.42 mL/min — ABNORMAL LOW (ref >60.00)
Glucose, Bld: 108 mg/dL — ABNORMAL HIGH (ref 70–99)
Potassium: 4.2 mEq/L (ref 3.5–5.1)
SODIUM: 141 meq/L (ref 135–145)

## 2015-02-12 NOTE — Patient Instructions (Signed)
We will check on the blood work today. We will call you back with the results.   We will see you back in about 3-6 months and if you have new problems sooner please call us at the office.

## 2015-02-12 NOTE — Progress Notes (Signed)
Pre visit review using our clinic review tool, if applicable. No additional management support is needed unless otherwise documented below in the visit note. 

## 2015-02-13 ENCOUNTER — Encounter: Payer: Self-pay | Admitting: Internal Medicine

## 2015-02-13 NOTE — Assessment & Plan Note (Signed)
BP at goal now, continue finasteride and lisinopril. Check labs today.

## 2015-02-13 NOTE — Assessment & Plan Note (Signed)
Will check lipid panel today per their request but feel that soon we are getting to the point where he will likely not benefit from the statin he is taking and can consider discontinuing. For now continue zocor as no significant side effects.

## 2015-02-13 NOTE — Assessment & Plan Note (Signed)
He does continue to suffer from frontal changes and dementia with behavioral disturbance. At this time will keep him as comfortable as possible and pursue a more palliative agenda given his poor prognosis overall.

## 2015-02-13 NOTE — Progress Notes (Signed)
   Subjective:    Patient ID: Ricky [name], male    DOB: 03-14-1931, 81 y.o.   MRN: [id number]  HPI The patient is an 81 YO man who is coming to follow up on his 81 overall health. The biggest challenge to his health at this time is the parotid cancer. He is s/p radiation to the area. It has caused disfiguration of his face. He has healed fairly well from radiation and denies any skin problems. His appetite is poor overall. They have declined any options of chemotherapy and declined to meet with the oncologist about it. He is also suffering from frontal dementia and this has caused significant change to his personality over the last year especially. His wife is with him and is struggling to deal with this transformation. He does not have inhibitiion and states what he wants without consideration. Is taking namenda and seroquel and follows with neurology.   Review of Systems  Constitutional: Positive for activity change. Negative for fever, appetite change and fatigue.  HENT: Positive for facial swelling and hearing loss. Negative for ear pain.   Eyes: Positive for redness and visual disturbance.  Respiratory: Negative for cough, shortness of breath and wheezing.   Cardiovascular: Negative for chest pain, palpitations and leg swelling.  Gastrointestinal: Negative for abdominal pain, diarrhea, constipation and abdominal distention.  Neurological: Positive for facial asymmetry and weakness. Negative for speech difficulty.       Frontal disinhibition       Objective:   Physical Exam  Constitutional: He appears well-developed.  HENT:  Right side of face slumping, eyelid drooping, total hearing loss of right ear, no vision useful from right eye  Cardiovascular: Normal rate.   Pulmonary/Chest: Effort normal and breath sounds normal.  Abdominal: Soft. He exhibits no distension. There is no tenderness.  Neurological: Coordination normal.  Patient is alert and frontally disinhibited.  Skin: Skin is  warm and dry.   Filed Vitals:   02/12/15 1017  BP: 120/70  Pulse: 87  Temp: 98.2 F (36.8 C)  TempSrc: Oral  Height: 6' (1.829 m)  Weight: 170 lb 8 oz (77.338 kg)  SpO2: 94%      Assessment & Plan:

## 2015-02-13 NOTE — Assessment & Plan Note (Signed)
Cancer described as poorly differentiated and prognosis not good. They are not pursuing cure and have declined chemotherapy and completed radiation. He is significantly disfigured and unable to hear out of the right or see out of the right. Unclear whether he has any brain mets that could be accelerating his personality changes. I have not known the patient long enough to know if some of these changes were present prior to the start of the parotid problems. Will provide expectant management. Wife will likely need support and could benefit from respite stay for her husband. Talked to her about this and she is not ready right now.

## 2015-02-17 ENCOUNTER — Ambulatory Visit (INDEPENDENT_AMBULATORY_CARE_PROVIDER_SITE_OTHER): Payer: Medicare Other | Admitting: *Deleted

## 2015-02-17 ENCOUNTER — Encounter: Payer: Self-pay | Admitting: Radiation Oncology

## 2015-02-17 DIAGNOSIS — I4891 Unspecified atrial fibrillation: Secondary | ICD-10-CM

## 2015-02-17 DIAGNOSIS — Z7901 Long term (current) use of anticoagulants: Secondary | ICD-10-CM | POA: Diagnosis not present

## 2015-02-17 DIAGNOSIS — I679 Cerebrovascular disease, unspecified: Secondary | ICD-10-CM | POA: Diagnosis not present

## 2015-02-17 LAB — POCT INR: INR: 1.9

## 2015-02-17 NOTE — Progress Notes (Signed)
  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 His tumor did regress nicely during the course of treatment.  Plan: The patient has completed radiation treatment. The patient will return to radiation oncology clinic for routine followup in one month. I advised them to call or return sooner if they have any questions or concerns related to their recovery or treatment.  -----------------------------------  Blair Promise, PhD, MD

## 2015-02-26 ENCOUNTER — Ambulatory Visit
Admission: RE | Admit: 2015-02-26 | Discharge: 2015-02-26 | Disposition: A | Discharge status: Home / Self Care | Payer: Medicare Other | Source: Ambulatory Visit | Attending: Radiation Oncology | Admitting: Radiation Oncology

## 2015-02-26 ENCOUNTER — Encounter: Payer: Self-pay | Admitting: *Deleted

## 2015-02-26 ENCOUNTER — Encounter: Payer: Self-pay | Admitting: Radiation Oncology

## 2015-02-26 VITALS — BP 148/72 | HR 78 | Temp 98.2°F | Resp 16 | Ht 72.0 in | Wt 170.2 lb

## 2015-02-26 DIAGNOSIS — C07 Malignant neoplasm of parotid gland: Secondary | ICD-10-CM

## 2015-02-26 NOTE — Progress Notes (Signed)
Sherald Hess here for follow up.  He denies pain, dry mouth and trouble swallowing.  He reports that he is able to eat what he wants.  His wife reports he is not eating as much as he was.  He has lost 15 lbs since January 26. He denies trouble hearing.  The skin on his right face/neck has hyperpigmentation.  He denies fatigue.  He reports having sores on his bottom jaw.    BP 148/72 mmHg  Pulse 78  Temp(Src) 98.2 F (36.8 C) (Oral)  Resp 16  Ht 6' (1.829 m)  Wt 170 lb 3.2 oz (77.202 kg)  BMI 23.08 kg/m2  SpO2 100%   Wt Readings from Last 3 Encounters:  02/12/15 170 lb 8 oz (77.338 kg)  01/20/15 171 lb 3.2 oz (77.656 kg)  01/06/15 176 lb (79.833 kg)

## 2015-02-26 NOTE — Progress Notes (Signed)
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  Pt feeling well. Pt right eyesight is improving. Pt states it feels well. Pt's appetite is well. Gums are sore. Pt has no other complaints at this time.  ALLERGIES:  has No Known Allergies.  Meds: Current Outpatient Prescriptions  Medication Sig Dispense Refill  . acetaminophen (TYLENOL) 500 MG tablet Take 500 mg by mouth every 6 (six) hours as needed for pain.     Marland Kitchen dextromethorphan-guaiFENesin (MUCINEX DM) 30-600 MG per 12 hr tablet Take 1 tablet by mouth 2 (two) times daily as needed.     . docusate sodium (COLACE) 100 MG capsule Take 1 capsule (100 mg total) by mouth every 12 (twelve) hours. 60 capsule 0  . donepezil (ARICEPT) 10 MG tablet TAKE  ONE TABLET BY MOUTH EVERY MORNING 30 tablet 5  . finasteride (PROSCAR) 5 MG tablet Take 5 mg by mouth every morning.     Marland Kitchen lisinopril (PRINIVIL,ZESTRIL) 20 MG tablet TAKE 1 TABLET (20 MG TOTAL) BY MOUTH DAILY. 30 tablet 5  . memantine (NAMENDA) 10 MG tablet Take 1 tablet (10 mg total) by mouth 2 (two) times daily. 180 tablet 3  . QUEtiapine (SEROQUEL) 50 MG tablet Take 2 tablets (100 mg total) by mouth daily. 60 tablet 3  . simvastatin (ZOCOR) 40 MG tablet TAKE ONE TABLET BY MOUTH AT BEDTIME 30 tablet 5  . warfarin (COUMADIN) 5 MG tablet TAKE AS INSTRUCTED BY ANTICOAGULATION CLINIC 35 tablet 1  . brimonidine-timolol (COMBIGAN) 0.2-0.5 % ophthalmic solution Place 1 drop into both eyes every 12 (twelve) hours.    Marland Kitchen latanoprost (XALATAN) 0.005 % ophthalmic solution Place 1 drop into both  eyes daily.     . timolol (BETIMOL) 0.5 % ophthalmic solution      No current facility-administered medications for this encounter.    Physical Findings: The patient is in no acute distress. Patient is alert and oriented.  height is 6' (1.829 m) and weight is 170 lb 3.2 oz (77.202 kg). His oral temperature is 98.2 F (36.8 C). His blood pressure is 148/72 and his pulse is 78. His respiration is 16 and oxygen saturation is 100%. .  Lungs are clear. Heart has regular r/r. No palpable adenopathy in neck and supraclavicular region. Skin has healed well. Some induration along jaw line (scar tissue on the right jaw). No obvious tumor. Oral cavity is clear.   Lab Findings: Lab Results  Component Value Date   WBC 7.1 10/30/2014   HGB 13.8 10/30/2014   HCT 41.9 10/30/2014   MCV 94.9 10/30/2014   PLT 159 10/30/2014    Radiographic Findings: No results found.  Impression:  The patient is recovering from the effects of radiation.  Pt appears to be doing well. Pt asked if he should drive and was advised that he should refrain from driving at the moment due to the condition of his right eye.  Plan:  Pt follow up in 3 months.  This document serves as a record of services personally performed by Gery Pray, MD. It was created on his  behalf by Darcus Austin, a trained medical scribe. The creation of this record is based on the scribe's personal observations and the provider's statements to them. This document has been checked and approved by the attending provider. ____________________________________ Gery Pray, MD

## 2015-02-27 NOTE — Progress Notes (Addendum)
To provide support and encouragement, care continuity and to assess for needs, met with patient prior to 1 month f/u appt with Dr. Sondra Come.  He was accompanied by his wife. 1. In response to his reporting reduced hydration, he was encouraged by this navigator and RN Santiago Glad to increase water/fluid consumption. 2. I encouraged continued use of Biafine until supply exhausted. 3. I emphasized importance of sun protection for areas of RT when outdoors. 4. He did not express any needs or concerns at this time. 5. I encouraged him and his wife to contact me if that changes, they verbalized understanding.  Gayleen Orem, RN, BSN, [place] [facility] [place] [phone]

## 2015-03-03 ENCOUNTER — Ambulatory Visit (INDEPENDENT_AMBULATORY_CARE_PROVIDER_SITE_OTHER): Payer: Medicare Other

## 2015-03-03 DIAGNOSIS — I679 Cerebrovascular disease, unspecified: Secondary | ICD-10-CM

## 2015-03-03 DIAGNOSIS — I4891 Unspecified atrial fibrillation: Secondary | ICD-10-CM

## 2015-03-03 DIAGNOSIS — Z7901 Long term (current) use of anticoagulants: Secondary | ICD-10-CM

## 2015-03-03 LAB — POCT INR: INR: 1.9

## 2015-03-07 ENCOUNTER — Other Ambulatory Visit: Payer: Self-pay | Admitting: Internal Medicine

## 2015-03-09 DIAGNOSIS — L570 Actinic keratosis: Secondary | ICD-10-CM | POA: Diagnosis not present

## 2015-03-10 ENCOUNTER — Encounter: Payer: Self-pay | Admitting: *Deleted

## 2015-03-19 ENCOUNTER — Encounter: Payer: Self-pay | Admitting: Cardiology

## 2015-03-24 ENCOUNTER — Ambulatory Visit (INDEPENDENT_AMBULATORY_CARE_PROVIDER_SITE_OTHER): Payer: Medicare Other | Admitting: *Deleted

## 2015-03-24 DIAGNOSIS — I4891 Unspecified atrial fibrillation: Secondary | ICD-10-CM | POA: Diagnosis not present

## 2015-03-24 DIAGNOSIS — I679 Cerebrovascular disease, unspecified: Secondary | ICD-10-CM

## 2015-03-24 DIAGNOSIS — Z7901 Long term (current) use of anticoagulants: Secondary | ICD-10-CM | POA: Diagnosis not present

## 2015-03-24 LAB — POCT INR: INR: 1.7

## 2015-04-03 DIAGNOSIS — H4011X3 Primary open-angle glaucoma, severe stage: Secondary | ICD-10-CM | POA: Diagnosis not present

## 2015-04-07 ENCOUNTER — Ambulatory Visit (INDEPENDENT_AMBULATORY_CARE_PROVIDER_SITE_OTHER): Payer: Medicare Other | Admitting: Pharmacist Clinician (PhC)/ Clinical Pharmacy Specialist

## 2015-04-07 DIAGNOSIS — I679 Cerebrovascular disease, unspecified: Secondary | ICD-10-CM

## 2015-04-07 DIAGNOSIS — I4891 Unspecified atrial fibrillation: Secondary | ICD-10-CM

## 2015-04-07 DIAGNOSIS — Z7901 Long term (current) use of anticoagulants: Secondary | ICD-10-CM

## 2015-04-07 LAB — POCT INR: INR: 2.3

## 2015-04-10 ENCOUNTER — Encounter: Payer: Self-pay | Admitting: *Deleted

## 2015-04-24 ENCOUNTER — Telehealth: Payer: Self-pay | Admitting: *Deleted

## 2015-04-24 NOTE — Telephone Encounter (Signed)
Oncology Nurse Navigator Documentation  Oncology Nurse Navigator Flowsheets 04/24/2015  Navigator Encounter Type Telephone;3 month  Called to check on patient's well-being since completing RT 02/07/15.  In response to inquiry, patient stated:   Pain: denies  Nutrition/Hydration:  Back to baseline, "eating like a pig".  Drinking decaff sodas, "I don't like water".  I encouraged him to try flavored waters for additional hydration.  BMs: daily, not using laxatives/stool softeners.  Mouth: dry.  Has Biotene MW but doesn't like to use.  I encouraged keeping bottle of flavored water at hand to sip.  Occasionally has somewhat thickened saliva but managed.  Skin: skin has healed, not applying Biafine.  Some redness on R (?) temple that appeared about 3 weeks ago but not of concern.  Energy Level: Back to baseline but activity minimal which is also baseline.  Needs:  Denied needs/concerns, understands I can be contacted.  I noted upcoming appt with Dr. Sondra Come on 7/21, patient and wife verbalized understanding.    Patient Visit Type Follow-up  Time Spent with Patient [place]a, RN, BSN, [place] [facility] [place] [phone]

## 2015-04-28 DIAGNOSIS — B351 Tinea unguium: Secondary | ICD-10-CM | POA: Diagnosis not present

## 2015-04-28 DIAGNOSIS — L851 Acquired keratosis [keratoderma] palmaris et plantaris: Secondary | ICD-10-CM | POA: Diagnosis not present

## 2015-04-28 DIAGNOSIS — I739 Peripheral vascular disease, unspecified: Secondary | ICD-10-CM | POA: Diagnosis not present

## 2015-05-04 ENCOUNTER — Encounter: Payer: Self-pay | Admitting: Internal Medicine

## 2015-05-04 DIAGNOSIS — Z950 Presence of cardiac pacemaker: Secondary | ICD-10-CM | POA: Diagnosis not present

## 2015-05-04 DIAGNOSIS — I4891 Unspecified atrial fibrillation: Secondary | ICD-10-CM | POA: Diagnosis not present

## 2015-05-05 ENCOUNTER — Ambulatory Visit (INDEPENDENT_AMBULATORY_CARE_PROVIDER_SITE_OTHER): Payer: Medicare Other

## 2015-05-05 ENCOUNTER — Encounter: Payer: Self-pay | Admitting: *Deleted

## 2015-05-05 DIAGNOSIS — I4891 Unspecified atrial fibrillation: Secondary | ICD-10-CM | POA: Diagnosis not present

## 2015-05-05 DIAGNOSIS — I679 Cerebrovascular disease, unspecified: Secondary | ICD-10-CM | POA: Diagnosis not present

## 2015-05-05 DIAGNOSIS — Z7901 Long term (current) use of anticoagulants: Secondary | ICD-10-CM | POA: Diagnosis not present

## 2015-05-05 LAB — POCT INR: INR: 1.7

## 2015-05-09 ENCOUNTER — Other Ambulatory Visit: Payer: Self-pay | Admitting: Internal Medicine

## 2015-05-18 ENCOUNTER — Ambulatory Visit (INDEPENDENT_AMBULATORY_CARE_PROVIDER_SITE_OTHER): Payer: Medicare Other | Admitting: Internal Medicine

## 2015-05-18 ENCOUNTER — Other Ambulatory Visit: Payer: Self-pay

## 2015-05-18 ENCOUNTER — Encounter: Payer: Self-pay | Admitting: Internal Medicine

## 2015-05-18 VITALS — BP 138/70 | HR 77 | Resp 11 | Ht 72.0 in | Wt 176.0 lb

## 2015-05-18 DIAGNOSIS — C07 Malignant neoplasm of parotid gland: Secondary | ICD-10-CM

## 2015-05-18 DIAGNOSIS — F0281 Dementia in other diseases classified elsewhere with behavioral disturbance: Secondary | ICD-10-CM

## 2015-05-18 DIAGNOSIS — F02818 Dementia in other diseases classified elsewhere, unspecified severity, with other behavioral disturbance: Secondary | ICD-10-CM

## 2015-05-18 NOTE — Progress Notes (Signed)
   Subjective:    Patient ID: [name], male    DOB: 10/15/1931, 81 y.o.   MRN: [id number]  HPI The patient is an 81 YO man coming in for follow up. No new complaints. Some behavioral change and lack of restraint. His wife is primary caregiver and answers most of the questions. He does have new rash on his cheek in the last several weeks. Denies chest pains or SOB or constipation or diarrhea.   Review of Systems  Constitutional: Positive for activity change. Negative for fever, appetite change and fatigue.  HENT: Positive for facial swelling and hearing loss. Negative for ear pain.   Eyes: Positive for redness and visual disturbance.  Respiratory: Negative for cough, shortness of breath and wheezing.   Cardiovascular: Negative for chest pain, palpitations and leg swelling.  Gastrointestinal: Negative for abdominal pain, diarrhea, constipation and abdominal distention.  Neurological: Positive for facial asymmetry and weakness. Negative for speech difficulty.       Frontal disinhibition       Objective:   Physical Exam  Constitutional: He appears well-developed.  HENT:  Right side of face slumping, eyelid drooping, total hearing loss of right ear, no vision useful from right eye  Cardiovascular: Normal rate.   Pulmonary/Chest: Effort normal and breath sounds normal.  Abdominal: Soft. He exhibits no distension. There is no tenderness.  Neurological: Coordination normal.  Patient is alert and frontally disinhibited.  Skin: Skin is warm and dry.   Filed Vitals:   05/18/15 1104  BP: 138/70  Pulse: 77  Resp: 11  Height: 6' (1.829 m)  Weight: 176 lb (79.833 kg)  SpO2: 97%      Assessment & Plan:

## 2015-05-18 NOTE — Patient Instructions (Signed)
We are not changing the medicines today and do not need to take blood work.   Come back in about 6 months. If you have new problems or questions before then please feel free to call us back.

## 2015-05-18 NOTE — Progress Notes (Signed)
Pre visit review using our clinic review tool, if applicable. No additional management support is needed unless otherwise documented below in the visit note. 

## 2015-05-18 NOTE — Assessment & Plan Note (Signed)
Still with behavioral changes and verbally abusive at times. He is frontally disinhibited. Continue aricept, namenda, seroquel. His wife is the primary caregiver and is burdened with his changes. She will think about respite stay for him so she can rest but she would feel guilty about that and does not want to pursue at this time. Help coming in with his bathing. He does not care for himself at this time.

## 2015-05-18 NOTE — Assessment & Plan Note (Signed)
New rash and lesion on the cheek right which could be related. No further treatment would be indicated at this time.

## 2015-05-27 ENCOUNTER — Encounter: Payer: Self-pay | Admitting: Internal Medicine

## 2015-05-27 ENCOUNTER — Ambulatory Visit (INDEPENDENT_AMBULATORY_CARE_PROVIDER_SITE_OTHER): Payer: Medicare Other | Admitting: *Deleted

## 2015-05-27 ENCOUNTER — Ambulatory Visit (INDEPENDENT_AMBULATORY_CARE_PROVIDER_SITE_OTHER): Payer: Medicare Other | Admitting: Internal Medicine

## 2015-05-27 VITALS — BP 132/50 | HR 75 | Ht 72.0 in | Wt 179.0 lb

## 2015-05-27 DIAGNOSIS — I442 Atrioventricular block, complete: Secondary | ICD-10-CM | POA: Diagnosis not present

## 2015-05-27 DIAGNOSIS — I4891 Unspecified atrial fibrillation: Secondary | ICD-10-CM

## 2015-05-27 DIAGNOSIS — I482 Chronic atrial fibrillation: Secondary | ICD-10-CM | POA: Diagnosis not present

## 2015-05-27 DIAGNOSIS — Z45018 Encounter for adjustment and management of other part of cardiac pacemaker: Secondary | ICD-10-CM | POA: Diagnosis not present

## 2015-05-27 DIAGNOSIS — Z7901 Long term (current) use of anticoagulants: Secondary | ICD-10-CM | POA: Diagnosis not present

## 2015-05-27 DIAGNOSIS — I679 Cerebrovascular disease, unspecified: Secondary | ICD-10-CM | POA: Diagnosis not present

## 2015-05-27 DIAGNOSIS — I4821 Permanent atrial fibrillation: Secondary | ICD-10-CM

## 2015-05-27 LAB — POCT INR: INR: 1.8

## 2015-05-27 LAB — CUP PACEART INCLINIC DEVICE CHECK
Battery Impedance: 8700 Ohm
Battery Voltage: 2.69 V
Brady Statistic RV Percent Paced: 99 %
Lead Channel Impedance Value: 453 Ohm
Lead Channel Impedance Value: 515 Ohm
Lead Channel Pacing Threshold Amplitude: 0.625 V
Lead Channel Setting Pacing Amplitude: 2 V
Lead Channel Setting Pacing Pulse Width: 0.4 ms
Lead Channel Setting Sensing Sensitivity: 4 mV
MDC IDC MSMT LEADCHNL RA SENSING INTR AMPL: 0.5 mV
MDC IDC MSMT LEADCHNL RV PACING THRESHOLD PULSEWIDTH: 0.4 ms
MDC IDC PG SERIAL: 1436753
MDC IDC SESS DTM: 20160720155249
MDC IDC STAT BRADY RA PERCENT PACED: 1.9 %

## 2015-05-27 NOTE — Patient Instructions (Signed)
Medication Instructions:  Your physician recommends that you continue on your current medications as directed. Please refer to the Current Medication list given to you today.  Labwork: None ordered  Testing/Procedures: None ordered  Follow-Up: Your physician wants you to follow-up in: 6 months with device clinic.  You will receive a reminder letter in the mail two months in advance. If you don't receive a letter, please call our office to schedule the follow-up appointment.  Your physician wants you to follow-up in: 1 year with Dr. Caryl Comes.  You will receive a reminder letter in the mail two months in advance. If you don't receive a letter, please call our office to schedule the follow-up appointment.   Any Other Special Instructions Will Be Listed Below (If Applicable). Thank you for choosing [facility]!!

## 2015-05-27 NOTE — Progress Notes (Signed)
Patient Care Team: [name], MD as PCP - General (Internal Medicine) [name], MD (Neurology) Angelia Mould, MD (Vascular Surgery) [name], MD (Cardiology) Marygrace Drought, MD (Ophthalmology) Gery Pray, MD as Consulting Physician (Radiation Oncology) Leota Sauers, RN as Oncology Nurse Navigator   HPI  Ricky [name] is a 81 y.o. male seen in followup for coronary artery disease. He status post CABG. He also has atrial arrhythmias and permanent atrial fibrillation. He is status post AV junction ablation and pacemaker implantation.  The patient denies chest pain, shortness of breath, nocturnal dyspnea, orthopnea or peripheral edema. There have been no palpitations, lightheadedness or syncope.  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 Obstructive sleep apnea     noncompliant with CPAP  . Ischemic cardiomyopathy   . Atrial fibrillation     on anticoag  . AV block, complete s/p ablation   . Pacemaker [place]   . Cerebrovascular disease   . Hypercholesterolemia   . Hiatal hernia   . Gastritis   . Diverticulosis   . Colonic polyp   . BPH (benign prostatic hypertrophy)   . DJD (degenerative joint disease)   . Memory loss   . Anxiety   . Anemia   . Shingles   . Carotid artery occlusion     s/p R CEA 08/2003, s/p L CEA 06/2013  . Hypertension   . Pneumonia     hx of  . Parkinsonian features   . CAD (coronary artery disease) 2000    s/p CABG x 4 07/1999  . Glaucoma   . 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 Radiation 11/20/14-01/07/15    right face/neck 60 gray    Past Surgical History  Procedure Laterality Date  . Pacemaker insertion    . Coronary artery bypass graft  07/1999    x4 by Dr. Jobie Quaker 9/00  . Carotid  endarterectomy Right 08/2003    right 10/04 Dr. Scot Dock  . Anal fissure repair    . Hiatal hernia repair      incarcerated stomach with takedown and repair of hiatus and gastropexy  . Insert / replace / remove pacemaker    . Endarterectomy Left 06/27/2013    Procedure: Left Carotid Endarterectomy with Finesse patch angioplasty;  Surgeon: Angelia Mould, MD;  Location: [facility];  Service: Vascular;  Laterality: Left;  . Hernia repair      Current Outpatient Prescriptions  Medication Sig Dispense Refill  . acetaminophen (TYLENOL) 500 MG tablet Take 500 mg by mouth every 6 (six) hours as needed for pain.     . brimonidine-timolol (COMBIGAN) 0.2-0.5 % ophthalmic solution Place 1 drop into both eyes every 12 (twelve) hours.    Marland Kitchen dextromethorphan-guaiFENesin (MUCINEX DM) 30-600 MG per 12 hr tablet Take 1 tablet by mouth 2 (two) times daily as needed.     . docusate sodium (COLACE) 100 MG capsule Take 1 capsule (100 mg total) by mouth every 12 (twelve) hours. 60 capsule 0  . donepezil (ARICEPT) 10 MG tablet Take 1 tablet (10 mg total) by mouth every morning. 90 tablet 3  . finasteride (PROSCAR) 5 MG tablet Take 5 mg by mouth every morning.     Marland Kitchen  latanoprost (XALATAN) 0.005 % ophthalmic solution Place 1 drop into both eyes daily.     Marland Kitchen lisinopril (PRINIVIL,ZESTRIL) 20 MG tablet Take 1 tablet (20 mg total) by mouth daily. 90 tablet 3  . memantine (NAMENDA) 10 MG tablet Take 1 tablet (10 mg total) by mouth 2 (two) times daily. 180 tablet 3  . QUEtiapine (SEROQUEL) 50 MG tablet Take 2 tablets (100 mg total) by mouth daily. 60 tablet 3  . simvastatin (ZOCOR) 40 MG tablet Take 1 tablet (40 mg total) by mouth at bedtime. 90 tablet 3  . timolol (BETIMOL) 0.5 % ophthalmic solution     . warfarin (COUMADIN) 5 MG tablet TAKE AS INSTRUCTED BY ANTICOAGULATION CLINIC 35 tablet 3   No current facility-administered medications for this visit.    No Known Allergies  Review of Systems negative except from  HPI and PMH  Physical Exam BP 132/50 mmHg  Pulse 75  Ht 6' (1.829 m)  Wt 179 lb (81.194 kg)  BMI 24.27 kg/m2 Well developed and nourished in no acute distress HENT normal Neck supple with JVP-flat Clear Regular rate and rhythm, no murmurs or gallops Abd-soft with active BS No Clubbing cyanosis edema Skin-warm and dry A & dememnted  Grossly normal sensory and motor function  Device pocket well healed; without hematoma or erythema.  There is no tethering     Assessment and  Plan  Atrial fibrillation  Coronary artery disease  S/p CABG  Squamous cell ca  S/p radiation   AV junction ablation/complete heart block  Pacemaker-[employer]. Jude The patient's device was interrogated.  The information was reviewed. No changes were made in the programming.    Continue anticoagulation.   Cancer worsenings will have implication as tp change out given device dependent   d

## 2015-05-28 ENCOUNTER — Ambulatory Visit
Admission: RE | Admit: 2015-05-28 | Discharge: 2015-05-28 | Disposition: A | Discharge status: Home / Self Care | Payer: Medicare Other | Source: Ambulatory Visit | Attending: Radiation Oncology | Admitting: Radiation Oncology

## 2015-05-28 ENCOUNTER — Encounter: Payer: Self-pay | Admitting: Radiation Oncology

## 2015-05-28 VITALS — BP 173/76 | HR 76 | Temp 98.3°F | Resp 20 | Ht 72.0 in | Wt 175.6 lb

## 2015-05-28 DIAGNOSIS — C07 Malignant neoplasm of parotid gland: Secondary | ICD-10-CM | POA: Diagnosis not present

## 2015-05-28 NOTE — Progress Notes (Signed)
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  Interval Since Last Radiation:  January 07, 2015 (20 weeks and 1 day)  Narrative:  The patient returns today for routine follow-up. He reports having a healthy appetite accompanied by healthy weight  gain, and no symptoms in regards to difficulty swallowing or chewing or nausea. He is not experiencing fatigue, loss of taste, or dry mouth. His wife commented that her husband "sleeps a lot". A redness and eschar scab is currently located on the right side of his head. "He don't want no pain medicine. He very seldom says he's in pain," the wife vocalized when questioned about her husbands pain management. "It bleeds sometimes and drains at night," she additionally vocalized when asked about the eschar scab currently prevalent on the right side of her husbands head.  ALLERGIES:  has No Known Allergies.  Meds: Current Outpatient Prescriptions  Medication Sig Dispense Refill  . acetaminophen (TYLENOL) 500 MG tablet Take 500 mg by mouth every 6 (six) hours as needed for pain.     . brimonidine-timolol (COMBIGAN) 0.2-0.5 % ophthalmic solution Place 1 drop into both eyes every 12 (twelve) hours.    Marland Kitchen dextromethorphan-guaiFENesin (MUCINEX DM) 30-600 MG per 12 hr tablet Take 1 tablet by mouth 2 (two) times daily as needed.     . docusate sodium (COLACE) 100 MG capsule Take 1 capsule (100 mg total) by mouth every 12 (twelve) hours. 60 capsule 0  . donepezil (ARICEPT) 10 MG tablet Take 1 tablet (10 mg total) by mouth every morning. 90 tablet 3  . finasteride (PROSCAR) 5 MG tablet Take 5 mg by mouth every morning.     .  latanoprost (XALATAN) 0.005 % ophthalmic solution Place 1 drop into both eyes daily.     Marland Kitchen lisinopril (PRINIVIL,ZESTRIL) 20 MG tablet Take 1 tablet (20 mg total) by mouth daily. 90 tablet 3  . memantine (NAMENDA) 10 MG tablet Take 1 tablet (10 mg total) by mouth 2 (two) times daily. 180 tablet 3  . QUEtiapine (SEROQUEL) 50 MG tablet Take 2 tablets (100 mg total) by mouth daily. 60 tablet 3  . simvastatin (ZOCOR) 40 MG tablet Take 1 tablet (40 mg total) by mouth at bedtime. 90 tablet 3  . timolol (BETIMOL) 0.5 % ophthalmic solution     . warfarin (COUMADIN) 5 MG tablet TAKE AS INSTRUCTED BY ANTICOAGULATION CLINIC 35 tablet 3   No current facility-administered medications for this encounter.    Physical Findings: The patient is in no acute distress and the patient is alert and oriented X3 at the today's appointment.  height is 6' (1.829 m) and weight is 175 lb 9.6 oz (79.652 kg). His oral temperature is 98.3 F (36.8 C). His blood pressure is 173/76 and his pulse is 76. His respiration is 20 and oxygen saturation is 100%. . The patient's lungs are clear, heart has a regular rate and rhythm, and no palpable adenopathy in neck and supraclavicular region are noted at this time. The area of eschar is noted along the right temporal area. There is nodularity along the skin of the right temporal and right preauricular area and right  upper neck concerning for skin involvement of malignancy. The oral cavity is moist without secondary infection Lab Findings: Lab Results  Component Value Date   WBC 7.1 10/30/2014   HGB 13.8 10/30/2014   HCT 41.9 10/30/2014   MCV 94.9 10/30/2014   PLT 159 10/30/2014    Radiographic Findings: No results found.  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 Exam is concerning today for tumor progression. fortunately the patient is not symptomatic.    Plan: Symptoms to be aware of, that may develop in regards to the patient's cancer was discussed with the patient and his wife. All questions vocalized by the patient's wife were fully addressed, including possibly involving Hospice in the future. Resources, including a visit from the social worker were provided during this visit. Other healthy options in regards to pain management if his pain were to worsen, including medications. The patient was made aware of his follow up appointment with radiation oncology to take place in 2 months. If he or his wife develops any further questions or concerns, he was encouraged to contact Dr. Sondra Come, PhD, MD.   This document serves as a record of services personally performed by Gery Pray, MD. It was created on his behalf by Lenn Cal, a trained medical scribe. The creation of this record is based on the scribe's personal observations and the provider's statements to them. This document has been checked and approved by the attending provider.    -----------------------------------  Blair Promise, PhD, MD

## 2015-05-28 NOTE — Progress Notes (Signed)
Follow up s/p radiation right parotid  11/20/14-01/07/15, appetite good, has gaine dweight, no c/o difficulty swallowing or chewing, no nausea,  No fatigue stated, sleeps a lot per wife, redness and eschar scab  on side of right side head , no dry mouth stated taste buds good, no rinses used in mouth BP 173/76 mmHg  Pulse 76  Temp(Src) 98.3 F (36.8 C) (Oral)  Resp 20  Ht 6' (1.829 m)  Wt 175 lb 9.6 oz (79.652 kg)  BMI 23.81 kg/m2  SpO2 100%  Wt Readings from Last 3 Encounters:  05/28/15 175 lb 9.6 oz (79.652 kg)  05/27/15 179 lb (81.194 kg)  05/18/15 176 lb (79.833 kg)    11:29 AM

## 2015-06-05 ENCOUNTER — Ambulatory Visit (INDEPENDENT_AMBULATORY_CARE_PROVIDER_SITE_OTHER): Payer: Medicare Other | Admitting: Urology

## 2015-06-05 DIAGNOSIS — N3941 Urge incontinence: Secondary | ICD-10-CM

## 2015-06-05 DIAGNOSIS — N401 Enlarged prostate with lower urinary tract symptoms: Secondary | ICD-10-CM

## 2015-06-10 ENCOUNTER — Ambulatory Visit (INDEPENDENT_AMBULATORY_CARE_PROVIDER_SITE_OTHER): Payer: Medicare Other | Admitting: *Deleted

## 2015-06-10 DIAGNOSIS — Z7901 Long term (current) use of anticoagulants: Secondary | ICD-10-CM

## 2015-06-10 DIAGNOSIS — I679 Cerebrovascular disease, unspecified: Secondary | ICD-10-CM

## 2015-06-10 DIAGNOSIS — I4891 Unspecified atrial fibrillation: Secondary | ICD-10-CM | POA: Diagnosis not present

## 2015-06-10 LAB — POCT INR: INR: 2.3

## 2015-07-07 DIAGNOSIS — B351 Tinea unguium: Secondary | ICD-10-CM | POA: Diagnosis not present

## 2015-07-07 DIAGNOSIS — I739 Peripheral vascular disease, unspecified: Secondary | ICD-10-CM | POA: Diagnosis not present

## 2015-07-07 DIAGNOSIS — L851 Acquired keratosis [keratoderma] palmaris et plantaris: Secondary | ICD-10-CM | POA: Diagnosis not present

## 2015-07-08 ENCOUNTER — Ambulatory Visit (INDEPENDENT_AMBULATORY_CARE_PROVIDER_SITE_OTHER): Payer: Medicare Other | Admitting: *Deleted

## 2015-07-08 DIAGNOSIS — Z7901 Long term (current) use of anticoagulants: Secondary | ICD-10-CM | POA: Diagnosis not present

## 2015-07-08 DIAGNOSIS — I4891 Unspecified atrial fibrillation: Secondary | ICD-10-CM

## 2015-07-08 DIAGNOSIS — I679 Cerebrovascular disease, unspecified: Secondary | ICD-10-CM

## 2015-07-08 LAB — POCT INR: INR: 3.3

## 2015-07-10 ENCOUNTER — Other Ambulatory Visit: Payer: Self-pay | Admitting: Internal Medicine

## 2015-07-10 ENCOUNTER — Other Ambulatory Visit: Payer: Self-pay | Admitting: Neurology

## 2015-07-14 ENCOUNTER — Emergency Department (HOSPITAL_COMMUNITY): Payer: Medicare Other

## 2015-07-14 ENCOUNTER — Inpatient Hospital Stay (HOSPITAL_COMMUNITY)
Admission: EM | Admit: 2015-07-14 | Discharge: 2015-08-08 | DRG: 193 | Disposition: E | Payer: Medicare Other | Attending: Internal Medicine | Admitting: Internal Medicine

## 2015-07-14 ENCOUNTER — Telehealth: Payer: Self-pay | Admitting: Internal Medicine

## 2015-07-14 ENCOUNTER — Encounter (HOSPITAL_COMMUNITY): Payer: Self-pay | Admitting: *Deleted

## 2015-07-14 DIAGNOSIS — J9602 Acute respiratory failure with hypercapnia: Secondary | ICD-10-CM | POA: Diagnosis not present

## 2015-07-14 DIAGNOSIS — D689 Coagulation defect, unspecified: Secondary | ICD-10-CM | POA: Diagnosis present

## 2015-07-14 DIAGNOSIS — G9340 Encephalopathy, unspecified: Secondary | ICD-10-CM | POA: Diagnosis not present

## 2015-07-14 DIAGNOSIS — R0602 Shortness of breath: Secondary | ICD-10-CM | POA: Diagnosis not present

## 2015-07-14 DIAGNOSIS — I129 Hypertensive chronic kidney disease with stage 1 through stage 4 chronic kidney disease, or unspecified chronic kidney disease: Secondary | ICD-10-CM | POA: Diagnosis not present

## 2015-07-14 DIAGNOSIS — M1990 Unspecified osteoarthritis, unspecified site: Secondary | ICD-10-CM | POA: Diagnosis present

## 2015-07-14 DIAGNOSIS — Z9981 Dependence on supplemental oxygen: Secondary | ICD-10-CM | POA: Diagnosis not present

## 2015-07-14 DIAGNOSIS — E780 Pure hypercholesterolemia: Secondary | ICD-10-CM | POA: Diagnosis present

## 2015-07-14 DIAGNOSIS — R0789 Other chest pain: Secondary | ICD-10-CM

## 2015-07-14 DIAGNOSIS — I2510 Atherosclerotic heart disease of native coronary artery without angina pectoris: Secondary | ICD-10-CM | POA: Diagnosis not present

## 2015-07-14 DIAGNOSIS — Z8249 Family history of ischemic heart disease and other diseases of the circulatory system: Secondary | ICD-10-CM | POA: Diagnosis not present

## 2015-07-14 DIAGNOSIS — R05 Cough: Secondary | ICD-10-CM | POA: Diagnosis not present

## 2015-07-14 DIAGNOSIS — G4733 Obstructive sleep apnea (adult) (pediatric): Secondary | ICD-10-CM | POA: Diagnosis present

## 2015-07-14 DIAGNOSIS — Z950 Presence of cardiac pacemaker: Secondary | ICD-10-CM | POA: Diagnosis present

## 2015-07-14 DIAGNOSIS — Z923 Personal history of irradiation: Secondary | ICD-10-CM | POA: Diagnosis not present

## 2015-07-14 DIAGNOSIS — J9601 Acute respiratory failure with hypoxia: Secondary | ICD-10-CM | POA: Diagnosis not present

## 2015-07-14 DIAGNOSIS — H409 Unspecified glaucoma: Secondary | ICD-10-CM | POA: Diagnosis not present

## 2015-07-14 DIAGNOSIS — I480 Paroxysmal atrial fibrillation: Secondary | ICD-10-CM | POA: Diagnosis not present

## 2015-07-14 DIAGNOSIS — R079 Chest pain, unspecified: Secondary | ICD-10-CM | POA: Diagnosis not present

## 2015-07-14 DIAGNOSIS — F0390 Unspecified dementia without behavioral disturbance: Secondary | ICD-10-CM | POA: Diagnosis present

## 2015-07-14 DIAGNOSIS — R410 Disorientation, unspecified: Secondary | ICD-10-CM | POA: Diagnosis present

## 2015-07-14 DIAGNOSIS — Z7401 Bed confinement status: Secondary | ICD-10-CM | POA: Diagnosis not present

## 2015-07-14 DIAGNOSIS — Z66 Do not resuscitate: Secondary | ICD-10-CM | POA: Diagnosis present

## 2015-07-14 DIAGNOSIS — I248 Other forms of acute ischemic heart disease: Secondary | ICD-10-CM | POA: Diagnosis present

## 2015-07-14 DIAGNOSIS — N183 Chronic kidney disease, stage 3 (moderate): Secondary | ICD-10-CM | POA: Diagnosis present

## 2015-07-14 DIAGNOSIS — I482 Chronic atrial fibrillation: Secondary | ICD-10-CM | POA: Diagnosis not present

## 2015-07-14 DIAGNOSIS — R739 Hyperglycemia, unspecified: Secondary | ICD-10-CM | POA: Diagnosis not present

## 2015-07-14 DIAGNOSIS — Z951 Presence of aortocoronary bypass graft: Secondary | ICD-10-CM | POA: Diagnosis not present

## 2015-07-14 DIAGNOSIS — J189 Pneumonia, unspecified organism: Principal | ICD-10-CM | POA: Diagnosis present

## 2015-07-14 DIAGNOSIS — Z515 Encounter for palliative care: Secondary | ICD-10-CM | POA: Diagnosis not present

## 2015-07-14 DIAGNOSIS — I509 Heart failure, unspecified: Secondary | ICD-10-CM | POA: Diagnosis not present

## 2015-07-14 DIAGNOSIS — G9341 Metabolic encephalopathy: Secondary | ICD-10-CM | POA: Diagnosis not present

## 2015-07-14 DIAGNOSIS — I255 Ischemic cardiomyopathy: Secondary | ICD-10-CM | POA: Diagnosis not present

## 2015-07-14 DIAGNOSIS — Y95 Nosocomial condition: Secondary | ICD-10-CM | POA: Diagnosis present

## 2015-07-14 DIAGNOSIS — I5031 Acute diastolic (congestive) heart failure: Secondary | ICD-10-CM | POA: Diagnosis present

## 2015-07-14 DIAGNOSIS — C07 Malignant neoplasm of parotid gland: Secondary | ICD-10-CM | POA: Diagnosis present

## 2015-07-14 DIAGNOSIS — R0682 Tachypnea, not elsewhere classified: Secondary | ICD-10-CM | POA: Diagnosis not present

## 2015-07-14 DIAGNOSIS — N400 Enlarged prostate without lower urinary tract symptoms: Secondary | ICD-10-CM | POA: Diagnosis present

## 2015-07-14 DIAGNOSIS — I959 Hypotension, unspecified: Secondary | ICD-10-CM | POA: Diagnosis present

## 2015-07-14 DIAGNOSIS — N179 Acute kidney failure, unspecified: Secondary | ICD-10-CM | POA: Diagnosis present

## 2015-07-14 DIAGNOSIS — Z85818 Personal history of malignant neoplasm of other sites of lip, oral cavity, and pharynx: Secondary | ICD-10-CM

## 2015-07-14 DIAGNOSIS — Z7901 Long term (current) use of anticoagulants: Secondary | ICD-10-CM | POA: Diagnosis not present

## 2015-07-14 DIAGNOSIS — E872 Acidosis: Secondary | ICD-10-CM | POA: Diagnosis present

## 2015-07-14 DIAGNOSIS — R918 Other nonspecific abnormal finding of lung field: Secondary | ICD-10-CM | POA: Diagnosis not present

## 2015-07-14 DIAGNOSIS — Z87891 Personal history of nicotine dependence: Secondary | ICD-10-CM | POA: Diagnosis not present

## 2015-07-14 DIAGNOSIS — I4891 Unspecified atrial fibrillation: Secondary | ICD-10-CM | POA: Diagnosis not present

## 2015-07-14 DIAGNOSIS — J690 Pneumonitis due to inhalation of food and vomit: Secondary | ICD-10-CM | POA: Diagnosis not present

## 2015-07-14 DIAGNOSIS — J90 Pleural effusion, not elsewhere classified: Secondary | ICD-10-CM | POA: Diagnosis not present

## 2015-07-14 DIAGNOSIS — R4182 Altered mental status, unspecified: Secondary | ICD-10-CM | POA: Diagnosis not present

## 2015-07-14 LAB — BASIC METABOLIC PANEL
Anion gap: 9 (ref 5–15)
BUN: 38 mg/dL — AB (ref 6–20)
CO2: 28 mmol/L (ref 22–32)
Calcium: 9.6 mg/dL (ref 8.9–10.3)
Chloride: 103 mmol/L (ref 101–111)
Creatinine, Ser: 1.97 mg/dL — ABNORMAL HIGH (ref 0.61–1.24)
GFR calc Af Amer: 35 mL/min — ABNORMAL LOW (ref >60)
GFR, EST NON AFRICAN AMERICAN: 30 mL/min — AB (ref >60)
GLUCOSE: 152 mg/dL — AB (ref 65–99)
POTASSIUM: 4.6 mmol/L (ref 3.5–5.1)
Sodium: 140 mmol/L (ref 135–145)

## 2015-07-14 LAB — PROTIME-INR
INR: 3.77 — ABNORMAL HIGH (ref 0.00–1.49)
Prothrombin Time: 36.3 seconds — ABNORMAL HIGH (ref 11.6–15.2)

## 2015-07-14 LAB — CBC
HEMATOCRIT: 41.4 % (ref 39.0–52.0)
Hemoglobin: 13.9 g/dL (ref 13.0–17.0)
MCH: 32 pg (ref 26.0–34.0)
MCHC: 33.6 g/dL (ref 30.0–36.0)
MCV: 95.2 fL (ref 78.0–100.0)
Platelets: 267 10*3/uL (ref 150–400)
RBC: 4.35 MIL/uL (ref 4.22–5.81)
RDW: 13.4 % (ref 11.5–15.5)
WBC: 9.3 10*3/uL (ref 4.0–10.5)

## 2015-07-14 LAB — TROPONIN I: Troponin I: <0.03 ng/mL (ref <0.031)

## 2015-07-14 LAB — BRAIN NATRIURETIC PEPTIDE: B NATRIURETIC PEPTIDE 5: 133 pg/mL — AB (ref 0.0–100.0)

## 2015-07-14 MED ORDER — VANCOMYCIN HCL IN DEXTROSE 750-5 MG/150ML-% IV SOLN
750.0000 mg | Freq: Once | INTRAVENOUS | Status: AC
  Administered 2015-07-14: 750 mg via INTRAVENOUS
  Filled 2015-07-14: qty 150

## 2015-07-14 MED ORDER — DEXTROSE 5 % IV SOLN
2.0000 g | Freq: Two times a day (BID) | INTRAVENOUS | Status: DC
  Administered 2015-07-15 – 2015-07-16 (×3): 2 g via INTRAVENOUS
  Filled 2015-07-14 (×5): qty 2

## 2015-07-14 MED ORDER — VANCOMYCIN HCL IN DEXTROSE 1-5 GM/200ML-% IV SOLN
1000.0000 mg | Freq: Once | INTRAVENOUS | Status: AC
  Administered 2015-07-14: 1000 mg via INTRAVENOUS
  Filled 2015-07-14: qty 200

## 2015-07-14 MED ORDER — VANCOMYCIN HCL IN DEXTROSE 750-5 MG/150ML-% IV SOLN
INTRAVENOUS | Status: AC
  Filled 2015-07-14: qty 150

## 2015-07-14 MED ORDER — DOCUSATE SODIUM 100 MG PO CAPS
100.0000 mg | ORAL_CAPSULE | Freq: Two times a day (BID) | ORAL | Status: DC
  Administered 2015-07-15 – 2015-07-17 (×6): 100 mg via ORAL
  Filled 2015-07-14 (×6): qty 1

## 2015-07-14 MED ORDER — LATANOPROST 0.005 % OP SOLN
1.0000 [drp] | Freq: Every day | OPHTHALMIC | Status: DC
  Administered 2015-07-15 – 2015-07-16 (×2): 1 [drp] via OPHTHALMIC
  Filled 2015-07-14: qty 2.5

## 2015-07-14 MED ORDER — SIMVASTATIN 20 MG PO TABS
40.0000 mg | ORAL_TABLET | Freq: Every day | ORAL | Status: DC
  Administered 2015-07-15 – 2015-07-16 (×3): 40 mg via ORAL
  Filled 2015-07-14 (×3): qty 2

## 2015-07-14 MED ORDER — TIMOLOL MALEATE 0.5 % OP SOLN
1.0000 [drp] | Freq: Two times a day (BID) | OPHTHALMIC | Status: DC
  Administered 2015-07-15 – 2015-07-17 (×6): 1 [drp] via OPHTHALMIC
  Filled 2015-07-14: qty 5

## 2015-07-14 MED ORDER — DM-GUAIFENESIN ER 30-600 MG PO TB12: 1.0000 | ORAL_TABLET | Freq: Two times a day (BID) | ORAL | Status: DC | PRN

## 2015-07-14 MED ORDER — BRIMONIDINE TARTRATE 0.2 % OP SOLN
1.0000 [drp] | Freq: Two times a day (BID) | OPHTHALMIC | Status: DC
  Administered 2015-07-15 – 2015-07-17 (×5): 1 [drp] via OPHTHALMIC
  Filled 2015-07-14: qty 5

## 2015-07-14 MED ORDER — FINASTERIDE 5 MG PO TABS
5.0000 mg | ORAL_TABLET | Freq: Every day | ORAL | Status: DC
  Administered 2015-07-15 – 2015-07-17 (×3): 5 mg via ORAL
  Filled 2015-07-14 (×6): qty 1

## 2015-07-14 MED ORDER — VANCOMYCIN HCL 10 G IV SOLR
1250.0000 mg | INTRAVENOUS | Status: DC
  Administered 2015-07-15: 1250 mg via INTRAVENOUS
  Filled 2015-07-14 (×2): qty 1250

## 2015-07-14 MED ORDER — SODIUM CHLORIDE 0.9 % IV BOLUS (SEPSIS)
500.0000 mL | Freq: Once | INTRAVENOUS | Status: AC
  Administered 2015-07-14: 500 mL via INTRAVENOUS

## 2015-07-14 MED ORDER — SODIUM CHLORIDE 0.9 % IV SOLN
INTRAVENOUS | Status: DC
  Administered 2015-07-15 – 2015-07-17 (×3): via INTRAVENOUS

## 2015-07-14 MED ORDER — DEXTROSE 5 % IV SOLN
2.0000 g | Freq: Once | INTRAVENOUS | Status: AC
  Administered 2015-07-14: 2 g via INTRAVENOUS
  Filled 2015-07-14: qty 2

## 2015-07-14 MED ORDER — MEMANTINE HCL 10 MG PO TABS
10.0000 mg | ORAL_TABLET | Freq: Two times a day (BID) | ORAL | Status: DC
  Administered 2015-07-15 – 2015-07-17 (×6): 10 mg via ORAL
  Filled 2015-07-14 (×6): qty 1

## 2015-07-14 MED ORDER — DONEPEZIL HCL 5 MG PO TABS
10.0000 mg | ORAL_TABLET | Freq: Every day | ORAL | Status: DC
  Administered 2015-07-15 – 2015-07-17 (×3): 10 mg via ORAL
  Filled 2015-07-14 (×3): qty 2

## 2015-07-14 NOTE — ED Provider Notes (Signed)
CSN: [id number]     Arrival date & time 08/02/2015  1638 History   First MD Initiated Contact with Patient 07/20/2015 1650     Chief Complaint  Patient presents with  . Chest Pain     Patient is a 81 y.o. male presenting with chest pain. The history is provided by the patient. No language interpreter was used.  Chest Pain  [name] presents for evaluation of chest pain.  He reports left sided chest pain since yesterday.  Pain is waxing and waning in nature and poorly characterized.  He denies fevers, abdominal pain, vomiting, sob, cough, hemoptysis.  Level V caveat due to confusion.  The history provided by the wife is that he had left-sided chest pain for the last couple days, small volume hemoptysis today. She describes it as approximately 1 teaspoon of blood. There are no reported fevers at home.  Past Medical History  Diagnosis Date  . Obstructive sleep apnea     noncompliant with CPAP  . Ischemic cardiomyopathy   . Atrial fibrillation     on anticoag  . AV block, complete s/p ablation   . Pacemaker [place]   . Cerebrovascular disease   . Hypercholesterolemia   . Hiatal hernia   . Gastritis   . Diverticulosis   . Colonic polyp   . BPH (benign prostatic hypertrophy)   . DJD (degenerative joint disease)   . Memory loss   . Anxiety   . Anemia   . Shingles   . Carotid artery occlusion     s/p R CEA 08/2003, s/p L CEA 06/2013  . Hypertension   . Pneumonia     hx of  . Parkinsonian features   . CAD (coronary artery disease) 2000    s/p CABG x 4 07/1999  . Glaucoma   . Radiation 11/20/14-01/07/15    right face/neck 60 gray  . 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 Pacemaker insertion    . Coronary artery bypass graft  07/1999    x4 by Dr. Jobie Quaker 9/00  . Carotid endarterectomy Right 08/2003    right 10/04 Dr. Scot Dock  . Anal fissure repair    . Hiatal hernia repair      incarcerated stomach with takedown and  repair of hiatus and gastropexy  . Insert / replace / remove pacemaker    . Endarterectomy Left 06/27/2013    Procedure: Left Carotid Endarterectomy with Finesse patch angioplasty;  Surgeon: Angelia Mould, MD;  Location: [facility] OR;  Service: Vascular;  Laterality: Left;  . Hernia repair     Family History  Problem Relation Age of Onset  . Coronary artery disease Other    Social History  Substance Use Topics  . Smoking status: Former Smoker -- 1.50 packs/day for 50 years    Types: Cigarettes    Quit date: 11/08/1995  . Smokeless tobacco: Never Used  . Alcohol Use: No     Comment: quit drinking back in 1977.    Review of Systems  Cardiovascular: Positive for chest pain.  All other systems reviewed and are negative.     Allergies  Review of patient's allergies indicates no known allergies.  Home Medications   Prior to Admission medications   Medication Sig Start Date End Date Taking? Authorizing Provider  acetaminophen (TYLENOL) 500 MG tablet Take 500 mg by mouth every 6 (six) hours as needed for pain.  Historical Provider, MD  brimonidine-timolol (COMBIGAN) 0.2-0.5 % ophthalmic solution Place 1 drop into both eyes every 12 (twelve) hours.    Historical Provider, MD  dextromethorphan-guaiFENesin (MUCINEX DM) 30-600 MG per 12 hr tablet Take 1 tablet by mouth 2 (two) times daily as needed.     Historical Provider, MD  docusate sodium (COLACE) 100 MG capsule Take 1 capsule (100 mg total) by mouth every 12 (twelve) hours. 07/05/13   [name], MD  donepezil (ARICEPT) 10 MG tablet Take 1 tablet (10 mg total) by mouth every morning. 05/12/15   [name], MD  finasteride (PROSCAR) 5 MG tablet Take 5 mg by mouth every morning.     Historical Provider, MD  latanoprost (XALATAN) 0.005 % ophthalmic solution Place 1 drop into both eyes daily.  01/18/13   Historical Provider, MD  lisinopril (PRINIVIL,ZESTRIL) 20 MG tablet Take 1 tablet (20 mg total) by mouth daily. 05/12/15    [name], MD  memantine (NAMENDA) 10 MG tablet Take 1 tablet (10 mg total) by mouth 2 (two) times daily. 01/20/15   [name], MD  QUEtiapine (SEROQUEL) 50 MG tablet TAKE 2 TABLETS BY MOUTH DAILY AS DIRECTED 07/10/15   [name], MD  simvastatin (ZOCOR) 40 MG tablet Take 1 tablet (40 mg total) by mouth at bedtime. 05/12/15   [name], MD  timolol (BETIMOL) 0.5 % ophthalmic solution     Historical Provider, MD  warfarin (COUMADIN) 5 MG tablet TAKE AS INSTRUCTED BY ANTICOAGULATION CLINIC 07/10/15   [name], MD   BP 116/55 mmHg  Pulse 70  Temp(Src) 98.4 F (36.9 C) (Oral)  Resp 25  Ht 6' (1.829 m)  Wt 191 lb (86.637 kg)  BMI 25.90 kg/m2  SpO2 91% Physical Exam  Constitutional: He appears well-developed and well-nourished.  HENT:  Head: Normocephalic and atraumatic.  Cardiovascular: Normal rate and regular rhythm.   No murmur heard. Pulmonary/Chest:  Tachypnea with decreased air movement bilaterally  Abdominal: Soft. There is no tenderness. There is no rebound and no guarding.  Musculoskeletal: He exhibits no edema or tenderness.  Neurological: He is alert.  Disoriented to time, right sided facial droop.  Skin: Skin is warm and dry. There is pallor.  Psychiatric: He has a normal mood and affect. His behavior is normal.  Nursing note and vitals reviewed.   ED Course  Procedures (including critical care time) Labs Review Labs Reviewed  BASIC METABOLIC PANEL - Abnormal; Notable for the following:    Glucose, Bld 152 (*)    BUN 38 (*)    Creatinine, Ser 1.97 (*)    GFR calc non Af Amer 30 (*)    GFR calc Af Amer 35 (*)    All other components within normal limits  BRAIN NATRIURETIC PEPTIDE - Abnormal; Notable for the following:    B Natriuretic Peptide 133.0 (*)    All other components within normal limits  PROTIME-INR - Abnormal; Notable for the following:    Prothrombin Time 36.3 (*)    INR 3.77 (*)    All other components within normal  limits  CULTURE, BLOOD (ROUTINE X 2)  CULTURE, BLOOD (ROUTINE X 2)  CBC  TROPONIN I  I-STAT CG4 LACTIC ACID, ED    Imaging Review Dg Chest Portable 1 View  07/10/2015   CLINICAL DATA:  Chest pain for 1 week, cough, history of head neck carcinoma  EXAM: PORTABLE CHEST - 1 VIEW  COMPARISON:  Chest x-ray of 06/25/2013  FINDINGS: The  lungs are poorly aerated. There is haziness at both lung bases right greater than left and possibly in the medial left upper lung field suspicious for multifocal pneumonia as well as volume loss. No definite pleural effusion is seen. Heart size is stable and a dual lead permanent pacemaker remains. Median sternotomy sutures are noted.  IMPRESSION: Poor inspiration with bibasilar opacities and possible medial left upper lung opacity. Some of these changes may be due to atelectasis but pneumonia cannot be excluded.   Electronically Signed   By: Ivar Drape M.D.   On: 08/01/2015 17:21   I have personally reviewed and evaluated these images and lab results as part of my medical decision-making.   EKG Interpretation   Date/Time:  Tuesday July 14 2015 16:49:27 EDT Ventricular Rate:  70 PR Interval:  184 QRS Duration: 141 QT Interval:  420 QTC Calculation: 453 R Axis:   -83 Text Interpretation:  Sinus rhythm VENTRICULAR PACED RHYTHM Anterolateral  infarct, age indeterminate Confirmed by Hazle Coca [phone]) on 08/01/2015  4:53:54 PM      MDM   Final diagnoses:  HCAP (healthcare-associated pneumonia)    Patient with history of stage IV cancer here for cough, chest pain, hemoptysis. Chest x-ray with infiltrate concerning for possible pneumonia. Treating for HCAP given symptoms. PE is less likely given patient's elevated INR. He is on Coumadin. Labs demonstrate acute kidney injury. Discussed with patient and wife findings of studies and need for further treatment. Discussed with hospitalist regarding admission for further workup.    Quintella Reichert, MD 08/02/2015  2250

## 2015-07-14 NOTE — Telephone Encounter (Signed)
Pt wife calling as pt has been having chest pains for the past few days but they are starting to happen more often.  She stated that pt get SOB at times.  Spoke with pt he stated it had happened this morning and was not going on at the moment.  Pt stated the pain is all across his chest with radiation down his right arm. During this pt started to sound SOB and when asked if CP had returned he stated it had.  Pt wife stated he has started coughing and recently coughed up some blood colored mucus, once.  Pt stated that nothing makes his pain worse or better and it only last a few minutes then it goes away but he becomes very short of breath.   Pt instructed to go to [facility] to be evaluated.  Pt stated he would go to the hospital.  Pt handed the phone back to his wife, she was told best options was for pt to go to the emergency room to be evaluated. She agreed to call 911 and not additional questions at this time.

## 2015-07-14 NOTE — ED Notes (Addendum)
Patient reports chest pain since last week, pain is intermittent and dull on left side of chest. Patient also reports cough that started yesterday that is blood tinged. Denies chest pain at present. Patient has stage IV cancer per EMS. Family not here at present, patient states "we need to ask his wife". Per EMS, last radiation was in March.

## 2015-07-14 NOTE — Progress Notes (Signed)
ANTIBIOTIC CONSULT NOTE - INITIAL  Pharmacy Consult for vancomycin Indication: pneumonia  No Known Allergies  Patient Measurements: Height: 6' (182.9 cm) Weight: 191 lb (86.637 kg) IBW/kg (Calculated) : 77.6 Temp: 98.4 F (36.9 C) (09/06 1652) Temp Source: Oral (09/06 1652) BP: 117/64 mmHg (09/06 1800) Pulse Rate: 70 (09/06 1800) Intake/Output from previous day:   Intake/Output from this shift:    Labs:  Recent Labs  07/13/2015 1715  WBC 9.3  HGB 13.9  PLT 267  CREATININE 1.97*   Estimated Creatinine Clearance: 32.3 mL/min (by C-G formula based on Cr of 1.97). No results for input(s): VANCOTROUGH, VANCOPEAK, VANCORANDOM, GENTTROUGH, GENTPEAK, GENTRANDOM, TOBRATROUGH, TOBRAPEAK, TOBRARND, AMIKACINPEAK, AMIKACINTROU, AMIKACIN in the last 72 hours.   Microbiology: No results found for this or any previous visit (from the past 720 hour(s)).  Medical History: Past Medical History  Diagnosis Date  . Obstructive sleep apnea     noncompliant with CPAP  . Ischemic cardiomyopathy   . Atrial fibrillation     on anticoag  . AV block, complete s/p ablation   . Pacemaker [place]   . Cerebrovascular disease   . Hypercholesterolemia   . Hiatal hernia   . Gastritis   . Diverticulosis   . Colonic polyp   . BPH (benign prostatic hypertrophy)   . DJD (degenerative joint disease)   . Memory loss   . Anxiety   . Anemia   . Shingles   . Carotid artery occlusion     s/p R CEA 08/2003, s/p L CEA 06/2013  . Hypertension   . Pneumonia     hx of  . Parkinsonian features   . CAD (coronary artery disease) 2000    s/p CABG x 4 07/1999  . Glaucoma   . Radiation 11/20/14-01/07/15    right face/neck 60 gray  . 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  His CrCl ~ 33 ml/min.  Vancomycin 1 gm was ordered in the ED  Goal of Therapy:  Vancomycin trough level 15-20 mcg/ml  Plan:   Vancomycin 750 mg IV X 1 for total load 1750 mg then 1250 mg IV q24 hours F/u renal function, cultures and clinical course  Marilee Ditommaso Poteet 08/07/2015,6:18 PM

## 2015-07-14 NOTE — H&P (Signed)
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  History obtained from wife and daughter as pt has mild to moderate dementia.  No fevers.  No n/v/d.  Has been sob, and coughing some.  No swelling in legs.  The last 3 days complaints of his chest hurting and having difficulty breathing but it is difficult for them to tell what is going on because of his dementia.  He has been more confused than normal, and more weak than normal since Friday (over 3 days ).  Today found to have pna on cxr, referred for admission for pna.    Review of Systems:  Positive and negative as per HPI otherwise all other systems are negative per family  Past Medical History: Past Medical History  Diagnosis Date  . Obstructive sleep apnea     noncompliant with CPAP  . Ischemic cardiomyopathy   . Atrial fibrillation     on anticoag  . AV block, complete s/p ablation   . Pacemaker [place]   . Cerebrovascular disease   . Hypercholesterolemia   . Hiatal hernia   . Gastritis   . Diverticulosis   . Colonic polyp   . BPH (benign prostatic hypertrophy)   . DJD (degenerative joint disease)   . Memory loss   . Anxiety   . Anemia   . Shingles   . Carotid artery occlusion     s/p R CEA 08/2003, s/p L CEA 06/2013  . Hypertension   . Pneumonia     hx of  . Parkinsonian features   . CAD (coronary artery disease) 2000    s/p CABG x 4 07/1999  . Glaucoma   . Radiation 11/20/14-01/07/15    right face/neck 60 gray  . 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 Pacemaker insertion    . Coronary artery bypass graft  07/1999    x4 by Dr. Jobie Quaker 9/00  . Carotid endarterectomy Right 08/2003    right 10/04 Dr. Scot Dock  . Anal fissure repair    . Hiatal hernia repair      incarcerated  stomach with takedown and repair of hiatus and gastropexy  . Insert / replace / remove pacemaker    . Endarterectomy Left 06/27/2013    Procedure: Left Carotid Endarterectomy with Finesse patch angioplasty;  Surgeon: Angelia Mould, MD;  Location: [facility] OR;  Service: Vascular;  Laterality: Left;  . Hernia repair      Medications: Prior to Admission medications   Medication Sig Start Date End Date Taking? Authorizing Provider  acetaminophen (TYLENOL) 500 MG tablet Take 500 mg by mouth every 6 (six) hours as needed for pain.    Yes Historical Provider, MD  brimonidine-timolol (COMBIGAN) 0.2-0.5 % ophthalmic solution Place 1 drop into both eyes every 12 (twelve) hours.   Yes Historical Provider, MD  dextromethorphan-guaiFENesin (MUCINEX DM) 30-600 MG per 12 hr tablet Take 1 tablet by mouth 2 (two) times daily as needed for cough.    Yes Historical Provider, MD  docusate sodium (COLACE) 100 MG capsule Take 1 capsule (100 mg total) by mouth every 12 (twelve) hours. 07/05/13  Yes [name], MD  donepezil (ARICEPT) 10 MG tablet Take 1 tablet (10 mg total) by mouth  every morning. 05/12/15  Yes [name], MD  finasteride (PROSCAR) 5 MG tablet Take 5 mg by mouth every morning.    Yes Historical Provider, MD  latanoprost (XALATAN) 0.005 % ophthalmic solution Place 1 drop into both eyes at bedtime.  01/18/13  Yes Historical Provider, MD  lisinopril (PRINIVIL,ZESTRIL) 20 MG tablet Take 1 tablet (20 mg total) by mouth daily. 05/12/15  Yes [name], MD  memantine (NAMENDA) 10 MG tablet Take 1 tablet (10 mg total) by mouth 2 (two) times daily. 01/20/15  Yes Carmen Dohmeier, MD  QUEtiapine (SEROQUEL) 50 MG tablet TAKE 2 TABLETS BY MOUTH DAILY AS DIRECTED Patient taking differently: TAKE ONE TABLET BY MOUTH TWICE DAILY 07/10/15  Yes [name], MD  simvastatin (ZOCOR) 40 MG tablet Take 1 tablet (40 mg total) by mouth at bedtime. 05/12/15  Yes [name], MD  warfarin (COUMADIN) 5 MG  tablet TAKE AS INSTRUCTED BY ANTICOAGULATION CLINIC Patient taking differently: TAKE AS INSTRUCTED BY ANTICOAGULATION CLINIC: 2.5MG  BY MOUTH ON TUESDAYS, THURSDAYS, AND SATURDAYS, THEN TAKE 5MG  ON ALL OTHER DAYS 07/10/15  Yes [name], MD    Allergies:  No Known Allergies  Social History:  reports that he quit smoking about 19 years ago. His smoking use included Cigarettes. He has a 75 pack-year smoking history. He has never used smokeless tobacco. He reports that he does not drink alcohol or use illicit drugs.  Family History: Family History  Problem Relation Age of Onset  . Coronary artery disease Other     Physical Exam: Filed Vitals:   07/16/2015 1715 07/26/2015 1730 07/28/2015 1800 08/03/2015 1853  BP:  128/63 117/64 128/60  Pulse: 70 70 70 75  Temp:    98.5 F (36.9 C)  TempSrc:    Oral  Resp: 26 30 27 14   Height:      Weight:      SpO2: 93% 90% 92% 100%   General appearance: alert, cooperative and no distress Head: Normocephalic, without obvious abnormality, atraumatic Eyes: negative Nose: Nares normal. Septum midline. Mucosa normal. No drainage or sinus tenderness. Neck: no JVD and supple, symmetrical, trachea midline Lungs: clear to auscultation bilaterally Heart: regular rate and rhythm, S1, S2 normal, no murmur, click, rub or gallop Abdomen: soft, non-tender; bowel sounds normal; no masses,  no organomegaly Extremities: extremities normal, atraumatic, no cyanosis or edema Pulses: 2+ and symmetric Skin: Skin color, texture, turgor normal. No rashes or lesions Neurologic: Grossly normal    Labs on Admission:   Recent Labs  07/24/2015 1715  NA 140  K 4.6  CL 103  CO2 28  GLUCOSE 152*  BUN 38*  CREATININE 1.97*  CALCIUM 9.6    Recent Labs  07/12/2015 1715  WBC 9.3  HGB 13.9  HCT 41.4  MCV 95.2  PLT 267    Recent Labs  07/23/2015 1715  TROPONINI <0.03   Radiological Exams on Admission: Dg Chest Portable 1 View  08/04/2015   CLINICAL DATA:  Chest  pain for 1 week, cough, history of head neck carcinoma  EXAM: PORTABLE CHEST - 1 VIEW  COMPARISON:  Chest x-ray of 06/25/2013  FINDINGS: The lungs are poorly aerated. There is haziness at both lung bases right greater than left and possibly in the medial left upper lung field suspicious for multifocal pneumonia as well as volume loss. No definite pleural effusion is seen. Heart size is stable and a dual lead permanent pacemaker remains. Median sternotomy sutures are noted.  IMPRESSION: Poor inspiration  with bibasilar opacities and possible medial left upper lung opacity. Some of these changes may be due to atelectasis but pneumonia cannot be excluded.   Electronically Signed   By: Ivar Drape M.D.   On: 07/15/2015 17:21   ekg reviewed ventricular paced rhythm Old Chart reviewed Case discussed with ed doctor reese  Assessment/Plan  81 yo male with encephalopathy from underlying HCAP  Principal Problem:   PNA (pneumonia)- place on broad spectrum abx, vanc and fortaz.  pna pathway, suspect chest pain related to pna.  Doubt PE pt anticoagulated inr over 3.  Afvss, admit to tele bed.  Should improve over the next couple of day with abx.  Active Problems:   Atrial fibrillation- rate controlled, on coumadin   Coronary artery disease  s/p CABG EF 55% 2011- noted   Pacemaker-single-chamber St. Jude's- noted   Cancer of parotid gland- noted   Chronic anticoagulation- hold coumadin today, inr over 3   Chest pain- serial trop overnight, suspect related to pna   Dementia- noted   Confusion- as above  mild AKI - ivf overnight, monitor cr closely  Admit to tele floor.  Family wishes to change is code status to DNR.  No intubation or CPR ever in the future which is appropriate.    DAVID,RACHAL A 08/07/2015, 8:17 PM

## 2015-07-14 NOTE — Telephone Encounter (Signed)
Pt c/o of Chest Pain: STAT if CP now or developed within 24 hours  1. Are you having CP right now? Yes  2. Are you experiencing any other symptoms (ex. SOB, nausea, vomiting, sweating)? Not breathing normally, spit up blood  3. How long have you been experiencing CP? Since Friday  4. Is your CP continuous or coming and going? Coming and going   5. Have you taken Nitroglycerin?  No ?

## 2015-07-14 NOTE — ED Notes (Signed)
Lab at bedside to collect blood cultures.

## 2015-07-15 ENCOUNTER — Encounter (HOSPITAL_COMMUNITY): Payer: Self-pay

## 2015-07-15 DIAGNOSIS — I480 Paroxysmal atrial fibrillation: Secondary | ICD-10-CM

## 2015-07-15 LAB — BASIC METABOLIC PANEL
Anion gap: 7 (ref 5–15)
BUN: 36 mg/dL — ABNORMAL HIGH (ref 6–20)
CALCIUM: 9.1 mg/dL (ref 8.9–10.3)
CO2: 30 mmol/L (ref 22–32)
CREATININE: 1.66 mg/dL — AB (ref 0.61–1.24)
Chloride: 103 mmol/L (ref 101–111)
GFR calc non Af Amer: 37 mL/min — ABNORMAL LOW (ref >60)
GFR, EST AFRICAN AMERICAN: 43 mL/min — AB (ref >60)
GLUCOSE: 109 mg/dL — AB (ref 65–99)
Potassium: 4.4 mmol/L (ref 3.5–5.1)
Sodium: 140 mmol/L (ref 135–145)

## 2015-07-15 LAB — CBC
HCT: 39.4 % (ref 39.0–52.0)
Hemoglobin: 13 g/dL (ref 13.0–17.0)
MCH: 31.6 pg (ref 26.0–34.0)
MCHC: 33 g/dL (ref 30.0–36.0)
MCV: 95.9 fL (ref 78.0–100.0)
PLATELETS: 261 10*3/uL (ref 150–400)
RBC: 4.11 MIL/uL — ABNORMAL LOW (ref 4.22–5.81)
RDW: 13.3 % (ref 11.5–15.5)
WBC: 7.8 10*3/uL (ref 4.0–10.5)

## 2015-07-15 LAB — TROPONIN I

## 2015-07-15 MED ORDER — ENSURE ENLIVE PO LIQD
237.0000 mL | Freq: Two times a day (BID) | ORAL | Status: DC
  Administered 2015-07-15 – 2015-07-17 (×4): 237 mL via ORAL

## 2015-07-15 MED ORDER — DEXTROSE 5 % IV SOLN
INTRAVENOUS | Status: AC
  Filled 2015-07-15: qty 2

## 2015-07-15 NOTE — Progress Notes (Signed)
Unable to complete admission health history, patient oriented to self only, no family at bedside.

## 2015-07-15 NOTE — Progress Notes (Signed)
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  Spoke with pt and wife. Wife reports patient was eating relatively well at home. He was eating 3 small meals with Ensure BID. He has had no n/v/c/d.   Pt's normal weight has been about 180. He had lost weight earlier this year, likely related to his radiation therapy for his cancer. However, in the past 5 months he has regained much of the weight he had lost. Pt/wife report no residual trouble with swallowing  Continue Ensure BID  Diet Order:  Diet Heart Room service appropriate?: Yes; Fluid consistency:: Thin  Skin:  Reviewed, no issues  Last BM:  9/6  Height:  Ht Readings from Last 1 Encounters:  07/10/2015 6' (1.829 m)   Weight:  Wt Readings from Last 1 Encounters:  07/29/2015 191 lb (86.637 kg)   Wt Readings from Last 10 Encounters:  08/01/2015 191 lb (86.637 kg)  05/28/15 175 lb 9.6 oz (79.652 kg)  05/27/15 179 lb (81.194 kg)  05/18/15 176 lb (79.833 kg)  02/26/15 170 lb 3.2 oz (77.202 kg)  02/12/15 170 lb 8 oz (77.338 kg)  01/20/15 171 lb 3.2 oz (77.656 kg)  01/06/15 176 lb (79.833 kg)  12/30/14 176 lb (79.833 kg)  12/16/14 183 lb 4.8 oz (83.144 kg)   Ideal Body Weight:  72 kg  BMI:  Body mass index is 25.9 kg/(m^2).  Estimated Nutritional Needs:  Kcal:  1650-1800 (19-21  kcal/kg) Protein:  81-97 (1-1.2 g/kg) Fluid:  1.7-1.8 liters  EDUCATION NEEDS:  No education needs identified at this time  Burtis Junes RD, LDN Nutrition Pager: [phone] 07/15/2015 4:31 PM

## 2015-07-15 NOTE — Progress Notes (Signed)
[facility] PROGRESS NOTE  FULTON MERRY CBS:[id number] DOB: [birth year] DOA: 08/02/2015 PCP: Olga Millers, MD  Assessment/Plan: Acute Encephalopathy -Presumed 2/2 CAP on top of baseline dementia. -Much improved today.  CAP -Continue broad spectrum coverage given h/o malignancy. -Cx data remains negative to date. -CP is much improved and presumed related to PNA.  Generalized Weakness -Presumed 2/2 CAP. -Will request PT evaluation.  Atrial Fibrillation -Rate controlled. -Continue coumadin.  H/o Parotid Gland Cancer -Follow up with oncology as an OP.  ARF -Improving with IVF.  Code Status: DNR Family Communication: None  Disposition Plan: To be determined.   Consultants:  None   Antibiotics:  Vancomycin  Fortaz   Subjective: No further CP, less SOB.  Objective: Filed Vitals:   08/07/2015 1853 07/30/2015 2028 07/19/2015 2224 07/15/15 0602  BP: 128/60 140/71 142/68 112/59  Pulse: 75 78 77 78  Temp: 98.5 F (36.9 C) 97.6 F (36.4 C) 97.5 F (36.4 C) 97.4 F (36.3 C)  TempSrc: Oral Oral Oral Oral  Resp: 14 13 20 20   Height:      Weight:      SpO2: 100% 93% 99% 99%   No intake or output data in the 24 hours ending 07/15/15 1450 Filed Weights   08/07/2015 1652  Weight: 86.637 kg (191 lb)    Exam:   General:  AA   Cardiovascular: RRR  Respiratory: coarse bilateral breath sounds  Abdomen: S/NT/ND/_BS  Extremities: trace bilateral edema   Neurologic:  Non-focal  Data Reviewed: Basic Metabolic Panel:  Recent Labs Lab 08/02/2015 1715 07/15/15 0615  NA 140 140  K 4.6 4.4  CL 103 103  CO2 28 30  GLUCOSE 152* 109*  BUN 38* 36*  CREATININE 1.97* 1.66*  CALCIUM 9.6 9.1   Liver Function Tests: No results for input(s): AST, ALT, ALKPHOS, BILITOT, PROT, ALBUMIN in the last 168 hours. No results for input(s): LIPASE, AMYLASE in the last 168 hours. No results for input(s): AMMONIA in the last 168 hours. CBC:  Recent Labs Lab  07/26/2015 1715 07/15/15 0615  WBC 9.3 7.8  HGB 13.9 13.0  HCT 41.4 39.4  MCV 95.2 95.9  PLT 267 261   Cardiac Enzymes:  Recent Labs Lab 07/20/2015 1715 07/19/2015 2310 07/15/15 0615 07/15/15 1058  TROPONINI <0.03 <0.03 <0.03 <0.03   BNP (last 3 results)  Recent Labs  08/06/2015 1715  BNP 133.0*    ProBNP (last 3 results) No results for input(s): PROBNP in the last 8760 hours.  CBG: No results for input(s): GLUCAP in the last 168 hours.  Recent Results (from the past 240 hour(s))  Blood Culture (routine x 2)     Status: None (Preliminary result)   Collection Time: 07/24/2015  6:33 PM  Result Value Ref Range Status   Specimen Description BLOOD RIGHT ARM  Final   Special Requests BOTTLES DRAWN AEROBIC AND ANAEROBIC 6CC  Final   Culture NO GROWTH < 24 HOURS  Final   Report Status PENDING  Incomplete  Blood Culture (routine x 2)     Status: None (Preliminary result)   Collection Time: 07/12/2015  6:37 PM  Result Value Ref Range Status   Specimen Description BLOOD RIGHT HAND  Final   Special Requests BOTTLES DRAWN AEROBIC AND ANAEROBIC 6CC  Final   Culture NO GROWTH < 24 HOURS  Final   Report Status PENDING  Incomplete     Studies: Dg Chest Portable 1 View  07/12/2015   CLINICAL DATA:  Chest pain  for 1 week, cough, history of head neck carcinoma  EXAM: PORTABLE CHEST - 1 VIEW  COMPARISON:  Chest x-ray of 06/25/2013  FINDINGS: The lungs are poorly aerated. There is haziness at both lung bases right greater than left and possibly in the medial left upper lung field suspicious for multifocal pneumonia as well as volume loss. No definite pleural effusion is seen. Heart size is stable and a dual lead permanent pacemaker remains. Median sternotomy sutures are noted.  IMPRESSION: Poor inspiration with bibasilar opacities and possible medial left upper lung opacity. Some of these changes may be due to atelectasis but pneumonia cannot be excluded.   Electronically Signed   By: Ivar Drape  M.D.   On: 07/23/2015 17:21    Scheduled Meds: . brimonidine  1 drop Both Eyes Q12H  . cefTAZidime (FORTAZ)  IV  2 g Intravenous Q12H  . docusate sodium  100 mg Oral Q12H  . donepezil  10 mg Oral Daily  . feeding supplement (ENSURE ENLIVE)  237 mL Oral BID BM  . finasteride  5 mg Oral Daily  . latanoprost  1 drop Both Eyes QHS  . memantine  10 mg Oral BID  . simvastatin  40 mg Oral QHS  . timolol  1 drop Both Eyes Q12H  . vancomycin  1,250 mg Intravenous Q24H   Continuous Infusions: . sodium chloride 75 mL/hr at 07/15/15 0101    Principal Problem:   PNA (pneumonia) Active Problems:   Atrial fibrillation   Coronary artery disease  s/p CABG EF 55% 2011   Pacemaker-single-chamber St. Jude's   Cancer of parotid gland   Chronic anticoagulation   Chest pain   Dementia   Confusion    Time spent: 25 minutes. Greater than 50% of this time was spent in direct contact with the patient coordinating care.    Lelon Frohlich  [facility] Pager [phone]  If 7PM-7AM, please contact night-coverage at [email or web address], password [facility] 07/15/2015, 2:50 PM  LOS: 1 day

## 2015-07-15 NOTE — Clinical Documentation Improvement (Signed)
Hospitalist      Please provide historical, or baseline comparative data to support the diagnosis of Acute Kidney Injury,  Please document this information in the progress notes and discharge summary.   Please exercise your independent, professional judgment when responding. A specific answer is not anticipated or expected.   Thank You, Erling Conte  RN BSN [place] [phone]

## 2015-07-16 LAB — BASIC METABOLIC PANEL
ANION GAP: 9 (ref 5–15)
BUN: 38 mg/dL — AB (ref 6–20)
CALCIUM: 9.3 mg/dL (ref 8.9–10.3)
CO2: 27 mmol/L (ref 22–32)
CREATININE: 1.42 mg/dL — AB (ref 0.61–1.24)
Chloride: 105 mmol/L (ref 101–111)
GFR calc Af Amer: 52 mL/min — ABNORMAL LOW (ref >60)
GFR, EST NON AFRICAN AMERICAN: 45 mL/min — AB (ref >60)
GLUCOSE: 112 mg/dL — AB (ref 65–99)
Potassium: 4.6 mmol/L (ref 3.5–5.1)
Sodium: 141 mmol/L (ref 135–145)

## 2015-07-16 LAB — CBC
HCT: 43.9 % (ref 39.0–52.0)
Hemoglobin: 14.2 g/dL (ref 13.0–17.0)
MCH: 31.1 pg (ref 26.0–34.0)
MCHC: 32.3 g/dL (ref 30.0–36.0)
MCV: 96.1 fL (ref 78.0–100.0)
PLATELETS: 335 10*3/uL (ref 150–400)
RBC: 4.57 MIL/uL (ref 4.22–5.81)
RDW: 13.3 % (ref 11.5–15.5)
WBC: 9.7 10*3/uL (ref 4.0–10.5)

## 2015-07-16 LAB — PROTIME-INR
INR: 4.6 — AB (ref 0.00–1.49)
PROTHROMBIN TIME: 42.2 s — AB (ref 11.6–15.2)

## 2015-07-16 MED ORDER — WARFARIN - PHARMACIST DOSING INPATIENT: Status: DC

## 2015-07-16 MED ORDER — AMOXICILLIN-POT CLAVULANATE 875-125 MG PO TABS
1.0000 | ORAL_TABLET | Freq: Two times a day (BID) | ORAL | Status: DC
  Administered 2015-07-16 – 2015-07-17 (×3): 1 via ORAL
  Filled 2015-07-16 (×3): qty 1

## 2015-07-16 NOTE — Progress Notes (Signed)
INR > 4 today, CBC stable.  Hold Coumadin today.  F/U plan per MD.  Pharmacy available to manage coumadin if desired.  Thanks.   Juel Burrow, PharmD

## 2015-07-16 NOTE — Progress Notes (Signed)
[facility] PROGRESS NOTE  Ricky [name] GYK:[id number] DOB: [birth year] DOA: 07/13/2015 PCP: Olga Millers, MD  Assessment/Plan: Acute Encephalopathy -Presumed 2/2 CAP on top of baseline dementia. -Much improved today. -Per wife, close to baseline.  CAP -Cx data remains negative to date. -CP is much improved and presumed related to PNA. -Will transition to PO antibiotics today (Augmentin).  Generalized Weakness -Presumed 2/2 CAP. -Will request PT evaluation.  Atrial Fibrillation -Rate controlled. -Continue coumadin as dosed by pharmacy.  H/o Parotid Gland Cancer -Follow up with oncology as an OP.  ARF -Improving with IVF. -Cr down to 1.42 down from 1.9 on admission.  Code Status: DNR Family Communication: None  Disposition Plan: To be determined.   Consultants:  None   Antibiotics:  Augmentin  Subjective: No further CP, less SOB. More alert.  Objective: Filed Vitals:   07/15/15 1629 07/15/15 2144 07/16/15 0550 07/16/15 1302  BP: 132/85 118/71 140/91 141/81  Pulse: 76 77 74 77  Temp: 97.4 F (36.3 C) 98.5 F (36.9 C) 97.6 F (36.4 C) 97.8 F (36.6 C)  TempSrc: Oral Oral Oral Oral  Resp: 20 20 20 20   Height:      Weight:      SpO2: 98% 98% 98% 98%    Intake/Output Summary (Last 24 hours) at 07/16/15 1535 Last data filed at 07/16/15 1200  Gross per 24 hour  Intake 1376.25 ml  Output    300 ml  Net 1076.25 ml   Filed Weights   07/15/2015 1652  Weight: 86.637 kg (191 lb)    Exam:   General:  AA   Cardiovascular: RRR  Respiratory: coarse bilateral breath sounds  Abdomen: S/NT/ND/_BS  Extremities: trace bilateral edema   Neurologic:  Non-focal  Data Reviewed: Basic Metabolic Panel:  Recent Labs Lab 07/29/2015 1715 07/15/15 0615 07/16/15 0602  NA 140 140 141  K 4.6 4.4 4.6  CL 103 103 105  CO2 28 30 27   GLUCOSE 152* 109* 112*  BUN 38* 36* 38*  CREATININE 1.97* 1.66* 1.42*  CALCIUM 9.6 9.1 9.3   Liver  Function Tests: No results for input(s): AST, ALT, ALKPHOS, BILITOT, PROT, ALBUMIN in the last 168 hours. No results for input(s): LIPASE, AMYLASE in the last 168 hours. No results for input(s): AMMONIA in the last 168 hours. CBC:  Recent Labs Lab 07/11/2015 1715 07/15/15 0615 07/16/15 0602  WBC 9.3 7.8 9.7  HGB 13.9 13.0 14.2  HCT 41.4 39.4 43.9  MCV 95.2 95.9 96.1  PLT 267 261 335   Cardiac Enzymes:  Recent Labs Lab 08/07/2015 1715 07/15/2015 2310 07/15/15 0615 07/15/15 1058  TROPONINI <0.03 <0.03 <0.03 <0.03   BNP (last 3 results)  Recent Labs  07/20/2015 1715  BNP 133.0*    ProBNP (last 3 results) No results for input(s): PROBNP in the last 8760 hours.  CBG: No results for input(s): GLUCAP in the last 168 hours.  Recent Results (from the past 240 hour(s))  Blood Culture (routine x 2)     Status: None (Preliminary result)   Collection Time: 08/05/2015  6:33 PM  Result Value Ref Range Status   Specimen Description BLOOD RIGHT ARM  Final   Special Requests BOTTLES DRAWN AEROBIC AND ANAEROBIC 6CC  Final   Culture NO GROWTH 2 DAYS  Final   Report Status PENDING  Incomplete  Blood Culture (routine x 2)     Status: None (Preliminary result)   Collection Time: 07/19/2015  6:37 PM  Result Value Ref Range  Status   Specimen Description BLOOD RIGHT HAND  Final   Special Requests BOTTLES DRAWN AEROBIC AND ANAEROBIC 6CC  Final   Culture NO GROWTH 2 DAYS  Final   Report Status PENDING  Incomplete     Studies: Dg Chest Portable 1 View  07/28/2015   CLINICAL DATA:  Chest pain for 1 week, cough, history of head neck carcinoma  EXAM: PORTABLE CHEST - 1 VIEW  COMPARISON:  Chest x-ray of 06/25/2013  FINDINGS: The lungs are poorly aerated. There is haziness at both lung bases right greater than left and possibly in the medial left upper lung field suspicious for multifocal pneumonia as well as volume loss. No definite pleural effusion is seen. Heart size is stable and a dual lead  permanent pacemaker remains. Median sternotomy sutures are noted.  IMPRESSION: Poor inspiration with bibasilar opacities and possible medial left upper lung opacity. Some of these changes may be due to atelectasis but pneumonia cannot be excluded.   Electronically Signed   By: Ivar Drape M.D.   On: 07/17/2015 17:21    Scheduled Meds: . amoxicillin-clavulanate  1 tablet Oral Q12H  . brimonidine  1 drop Both Eyes Q12H  . docusate sodium  100 mg Oral Q12H  . donepezil  10 mg Oral Daily  . feeding supplement (ENSURE ENLIVE)  237 mL Oral BID BM  . finasteride  5 mg Oral Daily  . latanoprost  1 drop Both Eyes QHS  . memantine  10 mg Oral BID  . simvastatin  40 mg Oral QHS  . timolol  1 drop Both Eyes Q12H   Continuous Infusions: . sodium chloride 75 mL/hr at 07/16/15 1444    Principal Problem:   PNA (pneumonia) Active Problems:   Atrial fibrillation   Coronary artery disease  s/p CABG EF 55% 2011   Pacemaker-single-chamber St. Jude's   Cancer of parotid gland   Chronic anticoagulation   Chest pain   Dementia   Confusion    Time spent: 25 minutes. Greater than 50% of this time was spent in direct contact with the patient coordinating care.    Lelon Frohlich  [facility] Pager [phone]  If 7PM-7AM, please contact night-coverage at [email or web address], password [facility] 07/16/2015, 3:35 PM  LOS: 2 days

## 2015-07-16 NOTE — Care Management Note (Signed)
Case Management Note  Patient Details  Name: Ricky [name] MRN: [id number] Date of Birth: 02-20-1931  Expected Discharge Date:  07/15/15               Expected Discharge Plan:  [place]  In-House Referral:  NA  Discharge planning Services  CM Consult  Post Acute Care Choice:    Choice offered to:     DME Arranged:    DME Agency:     HH Arranged:    HH Agency:     Status of Service:  In process, will continue to follow  Medicare Important Message Given:    Date Medicare IM Given:    Medicare IM give by:    Date Additional Medicare IM Given:    Additional Medicare Important Message give by:     If discussed at [place] of Stay Meetings, dates discussed:    Additional Comments: Admitted with PNA. Pt is from home, lives with wife and requires assistance with ADL's. Pt has dementia and is confused at baseline. Pt ambulates with a cane at baseline but has walker if needed. Pt's wife is elderly and says she struggles take care of him. Wife requesting assistance. CM discussed options with wife, HH vs PD care vs SNF vs ALF. PT will eval pt in AM. Pt has used HH in the past but wife can not remember what agency was used. Pt's wife does not want pt go to go ALF as they wish to stay together and pt's wife given a list of PD agencies. CM will cont to follow for DC planning after PT eval.  Sherald Barge, RN 07/16/2015, 4:42 PM

## 2015-07-17 ENCOUNTER — Inpatient Hospital Stay (HOSPITAL_COMMUNITY)
Admission: EM | Admit: 2015-07-17 | Discharge: 2015-08-08 | Disposition: E | Payer: Medicare Other | Source: Home / Self Care | Attending: Internal Medicine | Admitting: Internal Medicine

## 2015-07-17 ENCOUNTER — Inpatient Hospital Stay (HOSPITAL_COMMUNITY): Payer: Medicare Other

## 2015-07-17 ENCOUNTER — Emergency Department (HOSPITAL_COMMUNITY): Payer: Medicare Other

## 2015-07-17 ENCOUNTER — Encounter (HOSPITAL_COMMUNITY): Payer: Self-pay | Admitting: *Deleted

## 2015-07-17 DIAGNOSIS — Z515 Encounter for palliative care: Secondary | ICD-10-CM | POA: Insufficient documentation

## 2015-07-17 DIAGNOSIS — J9602 Acute respiratory failure with hypercapnia: Secondary | ICD-10-CM

## 2015-07-17 DIAGNOSIS — G4733 Obstructive sleep apnea (adult) (pediatric): Secondary | ICD-10-CM | POA: Diagnosis present

## 2015-07-17 DIAGNOSIS — F0390 Unspecified dementia without behavioral disturbance: Secondary | ICD-10-CM | POA: Diagnosis present

## 2015-07-17 DIAGNOSIS — R739 Hyperglycemia, unspecified: Secondary | ICD-10-CM | POA: Diagnosis present

## 2015-07-17 DIAGNOSIS — G9340 Encephalopathy, unspecified: Secondary | ICD-10-CM

## 2015-07-17 DIAGNOSIS — I509 Heart failure, unspecified: Secondary | ICD-10-CM

## 2015-07-17 DIAGNOSIS — I5031 Acute diastolic (congestive) heart failure: Secondary | ICD-10-CM | POA: Insufficient documentation

## 2015-07-17 DIAGNOSIS — Z7189 Other specified counseling: Secondary | ICD-10-CM | POA: Insufficient documentation

## 2015-07-17 DIAGNOSIS — J9690 Respiratory failure, unspecified, unspecified whether with hypoxia or hypercapnia: Secondary | ICD-10-CM

## 2015-07-17 DIAGNOSIS — J189 Pneumonia, unspecified organism: Secondary | ICD-10-CM

## 2015-07-17 DIAGNOSIS — N183 Chronic kidney disease, stage 3 unspecified: Secondary | ICD-10-CM | POA: Diagnosis present

## 2015-07-17 DIAGNOSIS — Z7901 Long term (current) use of anticoagulants: Secondary | ICD-10-CM

## 2015-07-17 DIAGNOSIS — I4891 Unspecified atrial fibrillation: Secondary | ICD-10-CM | POA: Diagnosis present

## 2015-07-17 DIAGNOSIS — J9601 Acute respiratory failure with hypoxia: Secondary | ICD-10-CM | POA: Diagnosis present

## 2015-07-17 DIAGNOSIS — I2510 Atherosclerotic heart disease of native coronary artery without angina pectoris: Secondary | ICD-10-CM | POA: Diagnosis present

## 2015-07-17 DIAGNOSIS — J9600 Acute respiratory failure, unspecified whether with hypoxia or hypercapnia: Secondary | ICD-10-CM | POA: Diagnosis present

## 2015-07-17 DIAGNOSIS — G9341 Metabolic encephalopathy: Secondary | ICD-10-CM | POA: Diagnosis present

## 2015-07-17 LAB — CBC WITH DIFFERENTIAL/PLATELET
BASOS PCT: 0 % (ref 0–1)
Basophils Absolute: 0 10*3/uL (ref 0.0–0.1)
EOS ABS: 0 10*3/uL (ref 0.0–0.7)
Eosinophils Relative: 0 % (ref 0–5)
HCT: 46.6 % (ref 39.0–52.0)
Hemoglobin: 15.1 g/dL (ref 13.0–17.0)
LYMPHS ABS: 0.8 10*3/uL (ref 0.7–4.0)
Lymphocytes Relative: 7 % — ABNORMAL LOW (ref 12–46)
MCH: 32.2 pg (ref 26.0–34.0)
MCHC: 32.4 g/dL (ref 30.0–36.0)
MCV: 99.4 fL (ref 78.0–100.0)
MONO ABS: 0.7 10*3/uL (ref 0.1–1.0)
MONOS PCT: 6 % (ref 3–12)
Neutro Abs: 10.9 10*3/uL — ABNORMAL HIGH (ref 1.7–7.7)
Neutrophils Relative %: 87 % — ABNORMAL HIGH (ref 43–77)
Platelets: 464 10*3/uL — ABNORMAL HIGH (ref 150–400)
RBC: 4.69 MIL/uL (ref 4.22–5.81)
RDW: 13.6 % (ref 11.5–15.5)
WBC: 12.4 10*3/uL — ABNORMAL HIGH (ref 4.0–10.5)

## 2015-07-17 LAB — COMPREHENSIVE METABOLIC PANEL
ALBUMIN: 2.8 g/dL — AB (ref 3.5–5.0)
ALK PHOS: 91 U/L (ref 38–126)
ALT: 12 U/L — ABNORMAL LOW (ref 17–63)
ANION GAP: 7 (ref 5–15)
AST: 27 U/L (ref 15–41)
BILIRUBIN TOTAL: 0.6 mg/dL (ref 0.3–1.2)
BUN: 45 mg/dL — ABNORMAL HIGH (ref 6–20)
CALCIUM: 9.4 mg/dL (ref 8.9–10.3)
CO2: 27 mmol/L (ref 22–32)
Chloride: 109 mmol/L (ref 101–111)
Creatinine, Ser: 1.41 mg/dL — ABNORMAL HIGH (ref 0.61–1.24)
GFR calc non Af Amer: 45 mL/min — ABNORMAL LOW (ref >60)
GFR, EST AFRICAN AMERICAN: 52 mL/min — AB (ref >60)
GLUCOSE: 170 mg/dL — AB (ref 65–99)
POTASSIUM: 4.8 mmol/L (ref 3.5–5.1)
SODIUM: 143 mmol/L (ref 135–145)
TOTAL PROTEIN: 7.1 g/dL (ref 6.5–8.1)

## 2015-07-17 LAB — BLOOD GAS, ARTERIAL
ACID-BASE EXCESS: 1.2 mmol/L (ref 0.0–2.0)
Acid-Base Excess: 0.8 mmol/L (ref 0.0–2.0)
BICARBONATE: 28.7 meq/L — AB (ref 20.0–24.0)
BICARBONATE: 26.4 meq/L — AB (ref 20.0–24.0)
DELIVERY SYSTEMS: POSITIVE
Drawn by: 22179
EXPIRATORY PAP: 7
FIO2: 100
FIO2: 100
Inspiratory PAP: 14
O2 SAT: 89.8 %
O2 Saturation: 99.4 %
PATIENT TEMPERATURE: 37
PCO2 ART: 78.3 mmHg — AB (ref 35.0–45.0)
PO2 ART: 75.3 mmHg — AB (ref 80.0–100.0)
Patient temperature: 37
RATE: 12 resp/min
TCO2: 26.4 mmol/L (ref 0–100)
TCO2: 23.7 mmol/L (ref 0–100)
pCO2 arterial: 54.8 mmHg — ABNORMAL HIGH (ref 35.0–45.0)
pH, Arterial: 7.19 — CL (ref 7.350–7.450)
pH, Arterial: 7.304 — ABNORMAL LOW (ref 7.350–7.450)
pO2, Arterial: 264 mmHg — ABNORMAL HIGH (ref 80.0–100.0)

## 2015-07-17 LAB — URINE MICROSCOPIC-ADD ON

## 2015-07-17 LAB — TROPONIN I
Troponin I: 0.08 ng/mL — ABNORMAL HIGH (ref <0.031)
Troponin I: 0.08 ng/mL — ABNORMAL HIGH (ref <0.031)

## 2015-07-17 LAB — URINALYSIS, ROUTINE W REFLEX MICROSCOPIC
BILIRUBIN URINE: NEGATIVE
Glucose, UA: NEGATIVE mg/dL
KETONES UR: NEGATIVE mg/dL
LEUKOCYTES UA: NEGATIVE
NITRITE: NEGATIVE
PH: 5.5 (ref 5.0–8.0)
Protein, ur: 100 mg/dL — AB
Specific Gravity, Urine: >1.03 — ABNORMAL HIGH (ref 1.005–1.030)
UROBILINOGEN UA: 0.2 mg/dL (ref 0.0–1.0)

## 2015-07-17 LAB — LACTIC ACID, PLASMA: LACTIC ACID, VENOUS: 2.3 mmol/L — AB (ref 0.5–2.0)

## 2015-07-17 LAB — I-STAT CG4 LACTIC ACID, ED
Lactic Acid, Venous: 2.43 mmol/L (ref 0.5–2.0)
Lactic Acid, Venous: 2.17 mmol/L (ref 0.5–2.0)

## 2015-07-17 LAB — PROTIME-INR
INR: 3.99 — ABNORMAL HIGH (ref 0.00–1.49)
INR: 4.09 — ABNORMAL HIGH (ref 0.00–1.49)
PROTHROMBIN TIME: 37.9 s — AB (ref 11.6–15.2)
PROTHROMBIN TIME: 38.6 s — AB (ref 11.6–15.2)

## 2015-07-17 LAB — BRAIN NATRIURETIC PEPTIDE: B Natriuretic Peptide: 1580 pg/mL — ABNORMAL HIGH (ref 0.0–100.0)

## 2015-07-17 LAB — I-STAT TROPONIN, ED: Troponin i, poc: 0.09 ng/mL (ref 0.00–0.08)

## 2015-07-17 MED ORDER — ONDANSETRON HCL 4 MG/2ML IJ SOLN: 4.0000 mg | Freq: Four times a day (QID) | INTRAMUSCULAR | Status: DC | PRN

## 2015-07-17 MED ORDER — SODIUM CHLORIDE 0.9 % IJ SOLN
3.0000 mL | Freq: Two times a day (BID) | INTRAMUSCULAR | Status: DC
  Administered 2015-07-17 – 2015-07-20 (×5): 3 mL via INTRAVENOUS

## 2015-07-17 MED ORDER — FUROSEMIDE 10 MG/ML IJ SOLN
40.0000 mg | Freq: Once | INTRAMUSCULAR | Status: AC
  Administered 2015-07-17: 40 mg via INTRAVENOUS

## 2015-07-17 MED ORDER — MEMANTINE HCL 10 MG PO TABS
10.0000 mg | ORAL_TABLET | Freq: Two times a day (BID) | ORAL | Status: DC
  Administered 2015-07-18 – 2015-07-21 (×6): 10 mg via ORAL
  Filled 2015-07-17 (×6): qty 1

## 2015-07-17 MED ORDER — PIPERACILLIN-TAZOBACTAM 3.375 G IVPB 30 MIN
3.3750 g | Freq: Once | INTRAVENOUS | Status: AC
  Administered 2015-07-17: 3.375 g via INTRAVENOUS
  Filled 2015-07-17: qty 50

## 2015-07-17 MED ORDER — DOCUSATE SODIUM 100 MG PO CAPS
100.0000 mg | ORAL_CAPSULE | Freq: Two times a day (BID) | ORAL | Status: DC
  Administered 2015-07-18 – 2015-07-21 (×6): 100 mg via ORAL
  Filled 2015-07-17 (×6): qty 1

## 2015-07-17 MED ORDER — BRIMONIDINE TARTRATE-TIMOLOL 0.2-0.5 % OP SOLN: 1.0000 [drp] | Freq: Two times a day (BID) | OPHTHALMIC | Status: DC

## 2015-07-17 MED ORDER — PIPERACILLIN-TAZOBACTAM 3.375 G IVPB
3.3750 g | Freq: Three times a day (TID) | INTRAVENOUS | Status: DC
  Administered 2015-07-18 – 2015-07-21 (×11): 3.375 g via INTRAVENOUS
  Filled 2015-07-17 (×14): qty 50

## 2015-07-17 MED ORDER — ONDANSETRON HCL 4 MG PO TABS: 4.0000 mg | ORAL_TABLET | Freq: Four times a day (QID) | ORAL | Status: DC | PRN

## 2015-07-17 MED ORDER — SIMVASTATIN 20 MG PO TABS
40.0000 mg | ORAL_TABLET | Freq: Every day | ORAL | Status: DC
  Administered 2015-07-18 – 2015-07-20 (×3): 40 mg via ORAL
  Filled 2015-07-17 (×3): qty 2

## 2015-07-17 MED ORDER — TIMOLOL MALEATE 0.5 % OP SOLN
1.0000 [drp] | Freq: Two times a day (BID) | OPHTHALMIC | Status: DC
  Administered 2015-07-18 – 2015-07-21 (×7): 1 [drp] via OPHTHALMIC
  Filled 2015-07-17: qty 5

## 2015-07-17 MED ORDER — FINASTERIDE 5 MG PO TABS
5.0000 mg | ORAL_TABLET | Freq: Every day | ORAL | Status: DC
  Administered 2015-07-19 – 2015-07-21 (×3): 5 mg via ORAL
  Filled 2015-07-17 (×5): qty 1

## 2015-07-17 MED ORDER — BRIMONIDINE TARTRATE 0.2 % OP SOLN
1.0000 [drp] | Freq: Two times a day (BID) | OPHTHALMIC | Status: DC
  Administered 2015-07-18 – 2015-07-21 (×7): 1 [drp] via OPHTHALMIC
  Filled 2015-07-17: qty 5

## 2015-07-17 MED ORDER — FUROSEMIDE 10 MG/ML IJ SOLN
40.0000 mg | Freq: Once | INTRAMUSCULAR | Status: DC
  Filled 2015-07-17: qty 4

## 2015-07-17 MED ORDER — DONEPEZIL HCL 5 MG PO TABS
10.0000 mg | ORAL_TABLET | Freq: Every day | ORAL | Status: DC
  Administered 2015-07-19 – 2015-07-21 (×3): 10 mg via ORAL
  Filled 2015-07-17 (×3): qty 2

## 2015-07-17 MED ORDER — ACETAMINOPHEN 325 MG PO TABS: 650.0000 mg | ORAL_TABLET | Freq: Four times a day (QID) | ORAL | Status: DC | PRN

## 2015-07-17 MED ORDER — SODIUM CHLORIDE 0.9 % IV SOLN
INTRAVENOUS | Status: DC
  Administered 2015-07-17: 19:00:00 via INTRAVENOUS
  Administered 2015-07-17: 10 mL/h via INTRAVENOUS

## 2015-07-17 MED ORDER — DM-GUAIFENESIN ER 30-600 MG PO TB12: 1.0000 | ORAL_TABLET | Freq: Two times a day (BID) | ORAL | Status: DC | PRN

## 2015-07-17 MED ORDER — WARFARIN - PHARMACIST DOSING INPATIENT
Status: DC
  Administered 2015-07-19: 16:00:00

## 2015-07-17 MED ORDER — SODIUM CHLORIDE 0.9 % IV SOLN: INTRAVENOUS | Status: DC

## 2015-07-17 MED ORDER — VANCOMYCIN HCL IN DEXTROSE 1-5 GM/200ML-% IV SOLN
1000.0000 mg | Freq: Once | INTRAVENOUS | Status: AC
  Administered 2015-07-17: 1000 mg via INTRAVENOUS
  Filled 2015-07-17: qty 200

## 2015-07-17 MED ORDER — QUETIAPINE FUMARATE 25 MG PO TABS
50.0000 mg | ORAL_TABLET | Freq: Two times a day (BID) | ORAL | Status: DC
  Administered 2015-07-18 – 2015-07-21 (×6): 50 mg via ORAL
  Filled 2015-07-17 (×5): qty 2
  Filled 2015-07-17 (×6): qty 1

## 2015-07-17 MED ORDER — FUROSEMIDE 10 MG/ML IJ SOLN
40.0000 mg | Freq: Every day | INTRAMUSCULAR | Status: DC
  Administered 2015-07-18: 40 mg via INTRAVENOUS
  Filled 2015-07-17: qty 4

## 2015-07-17 MED ORDER — LATANOPROST 0.005 % OP SOLN
1.0000 [drp] | Freq: Every day | OPHTHALMIC | Status: DC
  Administered 2015-07-18 – 2015-07-20 (×3): 1 [drp] via OPHTHALMIC
  Filled 2015-07-17: qty 2.5

## 2015-07-17 MED ORDER — ACETAMINOPHEN 650 MG RE SUPP: 650.0000 mg | Freq: Four times a day (QID) | RECTAL | Status: DC | PRN

## 2015-07-17 MED ORDER — VANCOMYCIN HCL IN DEXTROSE 750-5 MG/150ML-% IV SOLN
750.0000 mg | Freq: Two times a day (BID) | INTRAVENOUS | Status: DC
  Administered 2015-07-18: 750 mg via INTRAVENOUS
  Filled 2015-07-17 (×5): qty 150

## 2015-07-17 MED ORDER — AMOXICILLIN-POT CLAVULANATE 875-125 MG PO TABS: 1.0000 | ORAL_TABLET | Freq: Two times a day (BID) | ORAL | Status: AC

## 2015-07-17 NOTE — ED Provider Notes (Signed)
CSN: [id number]     Arrival date & time 07/29/2015  1623 History   First MD Initiated Contact with Patient 07/27/2015 1639     Chief Complaint  Patient presents with  . Respiratory Distress     (Consider location/radiation/quality/duration/timing/severity/associated sxs/prior Treatment) The history is provided by the EMS personnel and the spouse. The history is limited by the condition of the patient.   level V caveat applies due to patient's history of dimension and decreased mental status.  Patient brought in by EMS from [facility] a nursing facility where he was just being checked in. Was just discharged approximately 30 minutes prior from [facility] after being in the hospital for healthcare acquired pneumonia. Patient was found by the paramedics to have oxygen saturation in the low 80s. Was started on her percent 9 rebreather and transported here. Patient's blood sugar was fine. There initially was some concerns about perhaps a stroke patient was initially listed as a code stroke as there was facial droop however patient's wife states that that is baseline for him. Patient has a history of significant dementia. Patient is a DO NOT RESUSCITATE. Patient clearly with respiratory distress with breathing rates as high as 40.  Past Medical History  Diagnosis Date  . Obstructive sleep apnea     noncompliant with CPAP  . Ischemic cardiomyopathy   . Atrial fibrillation     on anticoag  . AV block, complete s/p ablation   . Pacemaker [place]   . Cerebrovascular disease   . Hypercholesterolemia   . Hiatal hernia   . Gastritis   . Diverticulosis   . Colonic polyp   . BPH (benign prostatic hypertrophy)   . DJD (degenerative joint disease)   . Memory loss   . Anxiety   . Anemia   . Shingles   . Carotid artery occlusion     s/p R CEA 08/2003, s/p L CEA 06/2013  . Hypertension   . Pneumonia     hx of  . Parkinsonian features   . CAD (coronary artery disease) 2000    s/p CABG x  4 07/1999  . Glaucoma   . Radiation 11/20/14-01/07/15    right face/neck 60 gray  . 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 Pacemaker insertion    . Coronary artery bypass graft  07/1999    x4 by Dr. Jobie Quaker 9/00  . Carotid endarterectomy Right 08/2003    right 10/04 Dr. Scot Dock  . Anal fissure repair    . Hiatal hernia repair      incarcerated stomach with takedown and repair of hiatus and gastropexy  . Insert / replace / remove pacemaker    . Endarterectomy Left 06/27/2013    Procedure: Left Carotid Endarterectomy with Finesse patch angioplasty;  Surgeon: Angelia Mould, MD;  Location: [facility] OR;  Service: Vascular;  Laterality: Left;  . Hernia repair     Family History  Problem Relation Age of Onset  . Coronary artery disease Other    Social History  Substance Use Topics  . Smoking status: Former Smoker -- 1.50 packs/day for 50 years    Types: Cigarettes    Quit date: 11/08/1995  . Smokeless tobacco: Never Used  . Alcohol Use: No     Comment: quit drinking back in 1977.    Review of Systems  Unable to perform ROS  level V caveat applies due to the patient's  history of dementia and altered mental status.    Allergies  Review of patient's allergies indicates no known allergies.  Home Medications   Prior to Admission medications   Medication Sig Start Date End Date Taking? Authorizing Provider  brimonidine-timolol (COMBIGAN) 0.2-0.5 % ophthalmic solution Place 1 drop into both eyes every 12 (twelve) hours.   Yes Historical Provider, MD  docusate sodium (COLACE) 100 MG capsule Take 1 capsule (100 mg total) by mouth every 12 (twelve) hours. 07/05/13  Yes [name], MD  donepezil (ARICEPT) 10 MG tablet Take 1 tablet (10 mg total) by mouth every morning. 05/12/15  Yes [name], MD  finasteride (PROSCAR) 5 MG tablet Take 5 mg by mouth every morning.    Yes Historical Provider, MD  latanoprost  (XALATAN) 0.005 % ophthalmic solution Place 1 drop into both eyes at bedtime.  01/18/13  Yes Historical Provider, MD  memantine (NAMENDA) 10 MG tablet Take 1 tablet (10 mg total) by mouth 2 (two) times daily. 01/20/15  Yes Carmen Dohmeier, MD  QUEtiapine (SEROQUEL) 50 MG tablet TAKE 2 TABLETS BY MOUTH DAILY AS DIRECTED Patient taking differently: TAKE ONE TABLET BY MOUTH TWICE DAILY 07/10/15  Yes [name], MD  simvastatin (ZOCOR) 40 MG tablet Take 1 tablet (40 mg total) by mouth at bedtime. 05/12/15  Yes [name], MD  warfarin (COUMADIN) 5 MG tablet TAKE AS INSTRUCTED BY ANTICOAGULATION CLINIC Patient taking differently: TAKE AS INSTRUCTED BY ANTICOAGULATION CLINIC: 2.5MG  BY MOUTH ON TUESDAYS, THURSDAYS, AND SATURDAYS, THEN TAKE 5MG  ON ALL OTHER DAYS 07/10/15  Yes [name], MD  acetaminophen (TYLENOL) 500 MG tablet Take 500 mg by mouth every 6 (six) hours as needed for pain.     Historical Provider, MD  amoxicillin-clavulanate (AUGMENTIN) 875-125 MG per tablet Take 1 tablet by mouth 2 (two) times daily. 07/28/2015   Erline Hau, MD  dextromethorphan-guaiFENesin [facility] DM) 30-600 MG per 12 hr tablet Take 1 tablet by mouth 2 (two) times daily as needed for cough.     Historical Provider, MD   BP 112/76 mmHg  Pulse 70  Temp(Src) 98.6 F (37 C)  Resp 25  SpO2 98% Physical Exam  Constitutional: He appears well-developed and well-nourished. He appears distressed.  HENT:  Head: Normocephalic and atraumatic.  Eyes: Conjunctivae are normal. Pupils are equal, round, and reactive to light.  Neck: Normal range of motion.  Cardiovascular: Normal rate, regular rhythm and normal heart sounds.   No murmur heard. Pulmonary/Chest: He is in respiratory distress. He has rales.  Abdominal: Soft. Bowel sounds are normal. There is no tenderness.  Musculoskeletal: He exhibits no edema.  Neurological:  Somnolent but will await to just move legs. Does not follow all commands.  Skin:  Skin is warm. No erythema.  Nursing note and vitals reviewed.   ED Course  Procedures (including critical care time) Labs Review Labs Reviewed  CBC WITH DIFFERENTIAL/PLATELET - Abnormal; Notable for the following:    WBC 12.4 (*)    Platelets 464 (*)    Neutrophils Relative % 87 (*)    Neutro Abs 10.9 (*)    Lymphocytes Relative 7 (*)    All other components within normal limits  COMPREHENSIVE METABOLIC PANEL - Abnormal; Notable for the following:    Glucose, Bld 170 (*)    BUN 45 (*)    Creatinine, Ser 1.41 (*)    Albumin 2.8 (*)    ALT 12 (*)    GFR calc non Af Wyvonnia Lora  45 (*)    GFR calc Af Amer 52 (*)    All other components within normal limits  URINALYSIS, ROUTINE W REFLEX MICROSCOPIC (NOT AT [facility]) - Abnormal; Notable for the following:    Specific Gravity, Urine >1.030 (*)    Hgb urine dipstick LARGE (*)    Protein, ur 100 (*)    All other components within normal limits  BLOOD GAS, ARTERIAL - Abnormal; Notable for the following:    pH, Arterial 7.190 (*)    pCO2 arterial 78.3 (*)    pO2, Arterial 75.3 (*)    Bicarbonate 28.7 (*)    Allens test (pass/fail) NOT INDICATED (*)    All other components within normal limits  BRAIN NATRIURETIC PEPTIDE - Abnormal; Notable for the following:    B Natriuretic Peptide 1580.0 (*)    All other components within normal limits  PROTIME-INR - Abnormal; Notable for the following:    Prothrombin Time 38.6 (*)    INR 4.09 (*)    All other components within normal limits  BLOOD GAS, ARTERIAL - Abnormal; Notable for the following:    pH, Arterial 7.304 (*)    pCO2 arterial 54.8 (*)    pO2, Arterial 264 (*)    Bicarbonate 26.4 (*)    All other components within normal limits  I-STAT CG4 LACTIC ACID, ED - Abnormal; Notable for the following:    Lactic Acid, Venous 2.43 (*)    All other components within normal limits  I-STAT TROPOININ, ED - Abnormal; Notable for the following:    Troponin i, poc 0.09 (*)    All other components  within normal limits  CULTURE, BLOOD (ROUTINE X 2)  CULTURE, BLOOD (ROUTINE X 2)  URINE CULTURE  TROPONIN I  URINE MICROSCOPIC-ADD ON  I-STAT CG4 LACTIC ACID, ED   Results for orders placed or performed during the hospital encounter of 07/25/2015  CBC with Differential/Platelet  Result Value Ref Range   WBC 12.4 (H) 4.0 - 10.5 K/uL   RBC 4.69 4.22 - 5.81 MIL/uL   Hemoglobin 15.1 13.0 - 17.0 g/dL   HCT 46.6 39.0 - 52.0 %   MCV 99.4 78.0 - 100.0 fL   MCH 32.2 26.0 - 34.0 pg   MCHC 32.4 30.0 - 36.0 g/dL   RDW 13.6 11.5 - 15.5 %   Platelets 464 (H) 150 - 400 K/uL   Neutrophils Relative % 87 (H) 43 - 77 %   Neutro Abs 10.9 (H) 1.7 - 7.7 K/uL   Lymphocytes Relative 7 (L) 12 - 46 %   Lymphs Abs 0.8 0.7 - 4.0 K/uL   Monocytes Relative 6 3 - 12 %   Monocytes Absolute 0.7 0.1 - 1.0 K/uL   Eosinophils Relative 0 0 - 5 %   Eosinophils Absolute 0.0 0.0 - 0.7 K/uL   Basophils Relative 0 0 - 1 %   Basophils Absolute 0.0 0.0 - 0.1 K/uL  Comprehensive metabolic panel  Result Value Ref Range   Sodium 143 135 - 145 mmol/L   Potassium 4.8 3.5 - 5.1 mmol/L   Chloride 109 101 - 111 mmol/L   CO2 27 22 - 32 mmol/L   Glucose, Bld 170 (H) 65 - 99 mg/dL   BUN 45 (H) 6 - 20 mg/dL   Creatinine, Ser 1.41 (H) 0.61 - 1.24 mg/dL   Calcium 9.4 8.9 - 10.3 mg/dL   Total Protein 7.1 6.5 - 8.1 g/dL   Albumin 2.8 (L) 3.5 - 5.0 g/dL   AST  27 15 - 41 U/L   ALT 12 (L) 17 - 63 U/L   Alkaline Phosphatase 91 38 - 126 U/L   Total Bilirubin 0.6 0.3 - 1.2 mg/dL   GFR calc non Af Amer 45 (L) >60 mL/min   GFR calc Af Amer 52 (L) >60 mL/min   Anion gap 7 5 - 15  Urinalysis, Routine w reflex microscopic (not at [facility])  Result Value Ref Range   Color, Urine YELLOW YELLOW   APPearance CLEAR CLEAR   Specific Gravity, Urine >1.030 (H) 1.005 - 1.030   pH 5.5 5.0 - 8.0   Glucose, UA NEGATIVE NEGATIVE mg/dL   Hgb urine dipstick LARGE (A) NEGATIVE   Bilirubin Urine NEGATIVE NEGATIVE   Ketones, ur NEGATIVE NEGATIVE mg/dL    Protein, ur 100 (A) NEGATIVE mg/dL   Urobilinogen, UA 0.2 0.0 - 1.0 mg/dL   Nitrite NEGATIVE NEGATIVE   Leukocytes, UA NEGATIVE NEGATIVE  Blood gas, arterial ([facility] & [facility] ONLY)  Result Value Ref Range   FIO2 100.00    Delivery systems OXYGEN MASK    pH, Arterial 7.190 (LL) 7.350 - 7.450   pCO2 arterial 78.3 (HH) 35.0 - 45.0 mmHg   pO2, Arterial 75.3 (L) 80.0 - 100.0 mmHg   Bicarbonate 28.7 (H) 20.0 - 24.0 mEq/L   TCO2 26.4 0 - 100 mmol/L   Acid-Base Excess 1.2 0.0 - 2.0 mmol/L   O2 Saturation 89.8 %   Patient temperature 37.0    Collection site BRACHIAL ARTERY    Drawn by COLLECTED BY RT    Sample type ARTERIAL    Allens test (pass/fail) NOT INDICATED (A) PASS  Brain natriuretic peptide  Result Value Ref Range   B Natriuretic Peptide 1580.0 (H) 0.0 - 100.0 pg/mL  Protime-INR  Result Value Ref Range   Prothrombin Time 38.6 (H) 11.6 - 15.2 seconds   INR 4.09 (H) 0.00 - 1.49  Blood gas, arterial ([facility] ONLY)  Result Value Ref Range   FIO2 100.00    Delivery systems BILEVEL POSITIVE AIRWAY PRESSURE    LHR 12 resp/min   Inspiratory PAP 14    Expiratory PAP 7    pH, Arterial 7.304 (L) 7.350 - 7.450   pCO2 arterial 54.8 (H) 35.0 - 45.0 mmHg   pO2, Arterial 264 (H) 80.0 - 100.0 mmHg   Bicarbonate 26.4 (H) 20.0 - 24.0 mEq/L   TCO2 23.7 0 - 100 mmol/L   Acid-Base Excess 0.8 0.0 - 2.0 mmol/L   O2 Saturation 99.4 %   Patient temperature 37.0    Collection site RIGHT RADIAL    Drawn by [id number]    Sample type ARTERIAL    Allens test (pass/fail) PASS PASS  Urine microscopic-add on  Result Value Ref Range   Squamous Epithelial / LPF FEW (A) RARE   WBC, UA 0-2 <3 WBC/hpf   RBC / HPF TOO NUMEROUS TO COUNT <3 RBC/hpf   Bacteria, UA FEW (A) RARE   Casts GRANULAR CAST (A) NEGATIVE  I-Stat CG4 Lactic Acid, ED  Result Value Ref Range   Lactic Acid, Venous 2.43 (HH) 0.5 - 2.0 mmol/L  I-Stat Troponin, ED (not at [facility])  Result Value Ref Range   Troponin i, poc 0.09 (HH) 0.00 - 0.08  ng/mL   Comment 3             Imaging Review Dg Chest Port 1 View  07/22/2015   CLINICAL DATA:  Shortness of breath with decreased oxygen saturation  EXAM: PORTABLE  CHEST - 1 VIEW  COMPARISON:  July 14, 2015  FINDINGS: There is airspace consolidation in both lung bases, essentially stable. There is a small effusion on the right. Heart is mildly enlarged with pulmonary vascularity within normal limits. Pacemaker leads are attached to the right atrium and right ventricle. No adenopathy. Patient is status post median sternotomy.  IMPRESSION: Bibasilar airspace consolidation. Cardiomegaly with small effusion. All of these findings may be indicative of congestive heart failure. Superimposed pneumonia in the bases, however, cannot be excluded. Both entities may exist concurrently.   Electronically Signed   By: Lowella Grip III M.D.   On: 07/24/2015 17:29   I have personally reviewed and evaluated these images and lab results as part of my medical decision-making.   EKG Interpretation   Date/Time:  Friday July 17 2015 16:25:38 EDT Ventricular Rate:  70 PR Interval:  65 QRS Duration: 145 QT Interval:  525 QTC Calculation: 567 R Axis:   -89 Text Interpretation:  A-V dual-paced rhythm with some inhibition No  further analysis attempted due to paced rhythm Baseline wander in lead(s)  II III aVF V6 Confirmed by Shameca Landen  MD, Trea Carnegie [id number]) on 07/16/2015  5:10:36 PM      CRITICAL CARE Performed by: Fredia Sorrow Total critical care time: 30 Critical care time was exclusive of separately billable procedures and treating other patients. Critical care was necessary to treat or prevent imminent or life-threatening deterioration. Critical care was time spent personally by me on the following activities: development of treatment plan with patient and/or surrogate as well as nursing, discussions with consultants, evaluation of patient's response to treatment, examination of patient,  obtaining history from patient or surrogate, ordering and performing treatments and interventions, ordering and review of laboratory studies, ordering and review of radiographic studies, pulse oximetry and re-evaluation of patient's condition.      MDM   Final diagnoses:  HCAP (healthcare-associated pneumonia)  Acute respiratory failure with hypercapnia   Patient just discharged today from the hospital to the nursing facility [facility]. When he got there he was less responsive than usual was noted to be in respiratory distress. Paramedics called oxygen saturation was in the low 80s. Patient started on 100% nonrebreather sats with that were about 89%. Patient started on BiPAP here. Patient is a DO NOT RESUSCITATE. Patient was just relieved discharge for pneumonia. Chest x-ray shows evidence of bilateral of airspace disease and perhaps pulmonary edema. Patient started on broad-spectrum anabiotic's that'll cover healthcare acquired pneumonia or any other serious infection. Based on the sepsis protocol. Patient's lactic acid was slightly elevated. Patient without any hypotension and not tachycardic however patient does have a paced rhythm. Troponin was slightly elevated this was pointed care with the troponin will be ordered. Patient will be admitted by the hospitalist service to the stepdown unit on BiPAP.  Initial blood gas showed evidence of significant acidosis and elevated carbon dioxide. After being on the BiPAP for a period of time pH improved and the PCO2 came down into the 50s. Overall patient showing signs of improvement.     Fredia Sorrow, MD 08/05/2015 2008

## 2015-07-17 NOTE — ED Notes (Signed)
Gordon/Son: cell [phone] Bonnie/Daughter: [phone] Please call wife first but if unable to reach wife, may call above numbers.

## 2015-07-17 NOTE — Progress Notes (Signed)
Pharmacy Note:  Initial antibiotics for Vancomycin and Zosyn ordered by EDP for Sepsis.  Estimated Creatinine Clearance: 45.1 mL/min (by C-G formula based on Cr of 1.41).   No Known Allergies  Filed Vitals:   07/15/2015 1815  BP: 156/87  Pulse: 70  Temp: 98.1 F (36.7 C)  Resp: 32    Anti-infectives    Start     Dose/Rate Route Frequency Ordered Stop   08/06/2015 1815  piperacillin-tazobactam (ZOSYN) IVPB 3.375 g     3.375 g 100 mL/hr over 30 Minutes Intravenous  Once 08/07/2015 1801     07/16/2015 1815  vancomycin (VANCOCIN) IVPB 1000 mg/200 mL premix     1,000 mg 200 mL/hr over 60 Minutes Intravenous  Once 07/11/2015 1801        Plan: Initial doses of Vancomycin and Zosyn  X 1 ordered. F/U admission orders for further dosing if therapy continued.  Pricilla Larsson, [facility] 07/16/2015 6:26 PM

## 2015-07-17 NOTE — H&P (Signed)
[facility] History and Physical  Ricky [name] JJK:[id number] DOB: [birth year] DOA: 07/20/2015  Referring physician: Dr. Rogene Houston. PCP: Olga Millers, MD  Specialists: None.  Chief Complaint: Shortness of breath and altered mental status.  HPI: Ricky [name] is a 81 y.o. male with history of dementia, CAD status post CABG, OSA, chronic atrial fibrillation, pacemaker placement who was discharged to nursing home yesterday was brought back to the ER immediately upon reaching the nursing facility as patient was found to be in acute respiratory distress with altered mental status. In the ER ABG shows severe respiratory acidosis and was placed on BiPAP with repeat ABG showing improvement. Patient was found to be confused. CT head did not show anything acute. Chest x-ray shows bilateral infiltrates concerning for CHF versus pneumonia. Patient was given Lasix and started on empiric antibiotics and admitted for further management of acute hypercarbic and hypoxic respiratory failure. On my exam patient is confused and lethargic. But family states he has improved from his arrival. Patient has been the family did not complain of any chest pain last evening or did not have any nausea vomiting abdominal pain or diarrhea.   Review of Systems: As presented in the history of presenting illness, rest negative.  Past Medical History  Diagnosis Date  . Obstructive sleep apnea     noncompliant with CPAP  . Ischemic cardiomyopathy   . Atrial fibrillation     on anticoag  . AV block, complete s/p ablation   . Pacemaker [place]   . Cerebrovascular disease   . Hypercholesterolemia   . Hiatal hernia   . Gastritis   . Diverticulosis   . Colonic polyp   . BPH (benign prostatic hypertrophy)   . DJD (degenerative joint disease)   . Memory loss   . Anxiety   . Anemia   . Shingles   . Carotid artery occlusion     s/p R CEA 08/2003, s/p L CEA 06/2013  . Hypertension   . Pneumonia     hx of  .  Parkinsonian features   . CAD (coronary artery disease) 2000    s/p CABG x 4 07/1999  . Glaucoma   . Radiation 11/20/14-01/07/15    right face/neck 60 gray  . 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 Pacemaker insertion    . Coronary artery bypass graft  07/1999    x4 by Dr. Jobie Quaker 9/00  . Carotid endarterectomy Right 08/2003    right 10/04 Dr. Scot Dock  . Anal fissure repair    . Hiatal hernia repair      incarcerated stomach with takedown and repair of hiatus and gastropexy  . Insert / replace / remove pacemaker    . Endarterectomy Left 06/27/2013    Procedure: Left Carotid Endarterectomy with Finesse patch angioplasty;  Surgeon: Angelia Mould, MD;  Location: [facility];  Service: Vascular;  Laterality: Left;  . Hernia repair     Social History:  reports that he quit smoking about 19 years ago. His smoking use included Cigarettes. He has a 75 pack-year smoking history. He has never used smokeless tobacco. He reports that he does not drink alcohol or use illicit drugs. Where does patient live home. Can patient participate in ADLs? Not sure.  No Known Allergies  Family History:  Family History  Problem Relation Age of Onset  . Coronary artery disease Other  Prior to Admission medications   Medication Sig Start Date End Date Taking? Authorizing Provider  brimonidine-timolol (COMBIGAN) 0.2-0.5 % ophthalmic solution Place 1 drop into both eyes every 12 (twelve) hours.   Yes Historical Provider, MD  docusate sodium (COLACE) 100 MG capsule Take 1 capsule (100 mg total) by mouth every 12 (twelve) hours. 07/05/13  Yes [name], MD  donepezil (ARICEPT) 10 MG tablet Take 1 tablet (10 mg total) by mouth every morning. 05/12/15  Yes [name], MD  finasteride (PROSCAR) 5 MG tablet Take 5 mg by mouth every morning.    Yes Historical Provider, MD  latanoprost (XALATAN) 0.005 % ophthalmic solution Place 1 drop into  both eyes at bedtime.  01/18/13  Yes Historical Provider, MD  memantine (NAMENDA) 10 MG tablet Take 1 tablet (10 mg total) by mouth 2 (two) times daily. 01/20/15  Yes Carmen Dohmeier, MD  QUEtiapine (SEROQUEL) 50 MG tablet TAKE 2 TABLETS BY MOUTH DAILY AS DIRECTED Patient taking differently: TAKE ONE TABLET BY MOUTH TWICE DAILY 07/10/15  Yes [name], MD  simvastatin (ZOCOR) 40 MG tablet Take 1 tablet (40 mg total) by mouth at bedtime. 05/12/15  Yes [name], MD  warfarin (COUMADIN) 5 MG tablet TAKE AS INSTRUCTED BY ANTICOAGULATION CLINIC Patient taking differently: TAKE AS INSTRUCTED BY ANTICOAGULATION CLINIC: 2.5MG  BY MOUTH ON TUESDAYS, THURSDAYS, AND SATURDAYS, THEN TAKE 5MG  ON ALL OTHER DAYS 07/10/15  Yes [name], MD  acetaminophen (TYLENOL) 500 MG tablet Take 500 mg by mouth every 6 (six) hours as needed for pain.     Historical Provider, MD  amoxicillin-clavulanate (AUGMENTIN) 875-125 MG per tablet Take 1 tablet by mouth 2 (two) times daily. 07/20/2015   Erline Hau, MD  dextromethorphan-guaiFENesin [facility] DM) 30-600 MG per 12 hr tablet Take 1 tablet by mouth 2 (two) times daily as needed for cough.     Historical Provider, MD    Physical Exam: Filed Vitals:   07/22/2015 1945 07/27/2015 2000 07/23/2015 2013 07/09/2015 2015  BP: 112/76  109/69 109/69  Pulse: 70 70 70 70  Temp: 98.6 F (37 C) 98.5 F (36.9 C) 98.6 F (37 C) 98.6 F (37 C)  Resp: 25 30 29 29   SpO2: 98% 99% 99% 100%     General:  Moderately built and nourished.  Eyes: Anicteric. No pallor.  ENT: No discharge from the ears eyes nose and mouth.  Neck: JVD is elevated. No mass felt.  Cardiovascular: S1-S2 heard.  Respiratory: No rhonchi or crepitations.  Abdomen: Soft nontender bowel sounds present.  Skin: No rash.  Musculoskeletal: No edema.  Psychiatric: Patient is lethargic.  Neurologic: Patient is lethargic.  Labs on Admission:  Basic Metabolic Panel:  Recent Labs Lab  07/18/2015 1715 07/15/15 0615 07/16/15 0602 07/30/2015 1705  NA 140 140 141 143  K 4.6 4.4 4.6 4.8  CL 103 103 105 109  CO2 28 30 27 27   GLUCOSE 152* 109* 112* 170*  BUN 38* 36* 38* 45*  CREATININE 1.97* 1.66* 1.42* 1.41*  CALCIUM 9.6 9.1 9.3 9.4   Liver Function Tests:  Recent Labs Lab 07/12/2015 1705  AST 27  ALT 12*  ALKPHOS 91  BILITOT 0.6  PROT 7.1  ALBUMIN 2.8*   No results for input(s): LIPASE, AMYLASE in the last 168 hours. No results for input(s): AMMONIA in the last 168 hours. CBC:  Recent Labs Lab 07/31/2015 1715 07/15/15 0615 07/16/15 0602 07/18/2015 1705  WBC 9.3 7.8 9.7 12.4*  NEUTROABS  --   --   --  10.9*  HGB 13.9 13.0 14.2 15.1  HCT 41.4 39.4 43.9 46.6  MCV 95.2 95.9 96.1 99.4  PLT 267 261 335 464*   Cardiac Enzymes:  Recent Labs Lab 07/31/2015 1715 07/11/2015 2310 07/15/15 0615 07/15/15 1058 07/16/2015 1705  TROPONINI <0.03 <0.03 <0.03 <0.03 0.08*    BNP (last 3 results)  Recent Labs  07/28/2015 1715 07/16/2015 1705  BNP 133.0* 1580.0*    ProBNP (last 3 results) No results for input(s): PROBNP in the last 8760 hours.  CBG: No results for input(s): GLUCAP in the last 168 hours.  Radiological Exams on Admission: Dg Chest Port 1 View  07/19/2015   CLINICAL DATA:  Shortness of breath with decreased oxygen saturation  EXAM: PORTABLE CHEST - 1 VIEW  COMPARISON:  July 14, 2015  FINDINGS: There is airspace consolidation in both lung bases, essentially stable. There is a small effusion on the right. Heart is mildly enlarged with pulmonary vascularity within normal limits. Pacemaker leads are attached to the right atrium and right ventricle. No adenopathy. Patient is status post median sternotomy.  IMPRESSION: Bibasilar airspace consolidation. Cardiomegaly with small effusion. All of these findings may be indicative of congestive heart failure. Superimposed pneumonia in the bases, however, cannot be excluded. Both entities may exist concurrently.    Electronically Signed   By: Lowella Grip III M.D.   On: 08/03/2015 17:29    EKG: Independently reviewed. Paced rhythm.  Assessment/Plan Principal Problem:   Acute respiratory failure with hypoxia and hypercarbia Active Problems:   Obstructive sleep apnea   Atrial fibrillation   Coronary artery disease  s/p CABG EF 55% 2011   Hyperglycemia   HCAP (healthcare-associated pneumonia)   Dementia   CKD (chronic kidney disease) stage 3, GFR 30-59 ml/min   Acute encephalopathy   Acute respiratory failure   CHF (congestive heart failure)   1. Acute hypoxic and hypercarbic respiratory failure - probably a combination of CHF/pneumonia/OSA. At this time we will continue on BiPAP. Patient is a DO NOT RESUSCITATE and this was confirmed with patient's wife and son-in-law at the bedside. Closely observe stepdown unit. Patient was given Lasix 40 mg IV one dose following with patient's respiratory status is improved with FiO2 decreased from 100% to 80%. I have also place patient on empiric antibiotics. Follow cultures. Check pro-calcitonin levels and lactic acid levels. Unfortunately at this time since patient has CHF with cannot hydrate aggressively. 2. Acute encephalopathy - probably secondary to hypercarbia and developing sepsis. Closely observe. 3. Possible sepsis - follow cultures and lactic acid levels. Unfortunately due to CHF we cannot aggressively hydrate. 4. CAD status post CABG - troponins are mildly elevated. We will cycle cardiac markers. Patient is on Coumadin. 5. Chronic atrial fibrillation - chads 2 vasc score is 5. Patient is on Coumadin. If patient cannot take oral medications due to encephalopathy then may have to change to heparin if INR is less than 2. 6. Chronic kidney disease stage III - creatinine appears to be at baseline. 7. Dementia.  I have reviewed patient's old chart and labs and possibly do an EKG and chest x-ray.  At this time I had an extensive discussion with  patient's wife and son-in-law. I have also consulted palliative care. Patient's prognosis looks poor at this time.   DVT Prophylaxis Coumadin.  Code Status: DO NOT RESUSCITATE.  Family Communication: Discussed with patient's wife and son-in-law.  Disposition Plan: Admit to inpatient.    KAKRAKANDY,ARSHAD N. [facility] Pager [phone].  If 7PM-7AM, please contact  night-coverage [email or web address] Password [facility] 07/22/2015, 8:42 PM

## 2015-07-17 NOTE — Progress Notes (Signed)
ANTIBIOTIC CONSULT NOTE - FOLLOW UP  Pharmacy Consult for Vancomycin and Zosyn  Indication: rule out sepsis  No Known Allergies  Patient Measurements:     Vital Signs: Temp: 98.8 F (37.1 C) (09/09 2100) BP: 135/89 mmHg (09/09 2100) Pulse Rate: 70 (09/09 2100) Intake/Output from previous day:   Intake/Output from this shift:    Labs:  Recent Labs  07/15/15 0615 07/16/15 0602 07/30/2015 1705  WBC 7.8 9.7 12.4*  HGB 13.0 14.2 15.1  PLT 261 335 464*  CREATININE 1.66* 1.42* 1.41*   Estimated Creatinine Clearance: 45.1 mL/min (by C-G formula based on Cr of 1.41). No results for input(s): VANCOTROUGH, VANCOPEAK, VANCORANDOM, GENTTROUGH, GENTPEAK, GENTRANDOM, TOBRATROUGH, TOBRAPEAK, TOBRARND, AMIKACINPEAK, AMIKACINTROU, AMIKACIN in the last 72 hours.   Microbiology: Recent Results (from the past 720 hour(s))  Blood Culture (routine x 2)     Status: None (Preliminary result)   Collection Time: 07/09/2015  6:33 PM  Result Value Ref Range Status   Specimen Description BLOOD RIGHT ARM  Final   Special Requests BOTTLES DRAWN AEROBIC AND ANAEROBIC 6CC  Final   Culture NO GROWTH 3 DAYS  Final   Report Status PENDING  Incomplete  Blood Culture (routine x 2)     Status: None (Preliminary result)   Collection Time: 07/20/2015  6:37 PM  Result Value Ref Range Status   Specimen Description BLOOD RIGHT HAND  Final   Special Requests BOTTLES DRAWN AEROBIC AND ANAEROBIC 6CC  Final   Culture NO GROWTH 3 DAYS  Final   Report Status PENDING  Incomplete    Anti-infectives    Start     Dose/Rate Route Frequency Ordered Stop   07/20/2015 1815  piperacillin-tazobactam (ZOSYN) IVPB 3.375 g     3.375 g 100 mL/hr over 30 Minutes Intravenous  Once 07/11/2015 1801 07/18/2015 1904   07/09/2015 1815  vancomycin (VANCOCIN) IVPB 1000 mg/200 mL premix     1,000 mg 200 mL/hr over 60 Minutes Intravenous  Once 07/28/2015 1801 07/31/2015 2004      Assessment: Okay for Protocol, recent admission.  Initial doses  given in ED.  Goal of Therapy:  Vancomycin trough level 15-20 mcg/ml  Plan:  Zosyn 3.375gm IV every 8 hours. Follow-up micro data, labs, vitals.  Vancomycin 750mg  IV every 12 hours. Measure antibiotic drug levels at steady state Follow up culture results  Pricilla Larsson 07/31/2015,9:42 PM

## 2015-07-17 NOTE — Care Management Note (Signed)
Case Management Note  Patient Details  Name: Ricky [name] MRN: [id number] Date of Birth: 11-Sep-1931  Expected Discharge Date:  07/15/15               Expected Discharge Plan:  [place]  In-House Referral:  Clinical Social Work  Discharge planning Services  CM Consult  Post Acute Care Choice:    Choice offered to:     DME Arranged:    DME Agency:     HH Arranged:    [place] Agency:     Status of Service:  Completed, signed off  Medicare Important Message Given:  Yes-second notification given Date Medicare IM Given:    Medicare IM give by:    Date Additional Medicare IM Given:    Additional Medicare Important Message give by:     If discussed at [place] of Stay Meetings, dates discussed:    Additional Comments: PT evaluated pt this morning and recommended SNF. CSW saw pt and wife and has arranged for placement at [facility].  Pt discharged earlier today to SNF. No CM needs noted.  Sherald Barge, RN 08/07/2015, 3:48 PM

## 2015-07-17 NOTE — Progress Notes (Signed)
Report given to Melina Fiddler, RN from [facility].  Voiced to her due to the patient being under paltive care there had not been any vital signs since last pm, she stated they were okay to use.  Voiced to her that the patient had been on O2 at 2LPM since [place] from physical therapy stated this a around 0900 that she had teste the sats on room air and they were 89%, she added the 2L which he is now sating >/= 90% She verbalized understanding.  [facility] arrived to pick up the patient for transport.  The patient left the floor with [facility] staff and family recently cleaned in stable condition.

## 2015-07-17 NOTE — Clinical Social Work Placement (Signed)
   CLINICAL SOCIAL WORK PLACEMENT  NOTE  Date:  07/26/2015  Patient Details  Name: Ricky [name] MRN: [id number] Date of Birth: 06-03-1931  Clinical Social Work is seeking post-discharge placement for this patient at the [place] level of care (*CSW will initial, date and re-position this form in  chart as items are completed):  Yes   Patient/family provided with [facility] Work Department's list of facilities offering this level of care within the geographic area requested by the patient (or if unable, by the patient's family).  Yes   Patient/family informed of their freedom to choose among providers that offer the needed level of care, that participate in Medicare, Medicaid or managed care program needed by the patient, have an available bed and are willing to accept the patient.  Yes   Patient/family informed of [facility]'s ownership interest in [facility] and [facility], as well as of the fact that they are under no obligation to receive care at these facilities.  PASRR submitted to EDS on 07/14/2015     PASRR number received on 08/02/2015     Existing PASRR number confirmed on       FL2 transmitted to all facilities in geographic area requested by pt/family on       FL2 transmitted to all facilities within larger geographic area on 07/20/2015     Patient informed that his/her managed care company has contracts with or will negotiate with certain facilities, including the following:            Patient/family informed of bed offers received.  Patient chooses bed at       Physician recommends and patient chooses bed at      Patient to be transferred to   on  .  Patient to be transferred to facility by       Patient family notified on   of transfer.  Name of family member notified:        PHYSICIAN       Additional Comment:    _______________________________________________ Ihor Gully, LCSW 07/11/2015, 11:54  AM [phone]

## 2015-07-17 NOTE — Clinical Social Work Note (Signed)
Clinical Social Work Assessment  Patient Details  Name: Ricky [name] MRN: [id number] Date of Birth: Jul 02, 1931  Date of referral:  07/23/2015               Reason for consult:  Facility Placement                Permission sought to share information with:    Permission granted to share information::     Name::        Agency::     Relationship::     Contact Information:     Housing/Transportation Living arrangements for the past 2 months:  Single Family Home Source of Information:  Patient, Spouse Patient Interpreter Needed:  None Criminal Activity/Legal Involvement Pertinent to Current Situation/Hospitalization:  No - Comment as needed Significant Relationships:  Spouse Lives with:  Spouse Do you feel safe going back to the place where you live?  Yes Need for family participation in patient care:  Yes (Comment)  Care giving concerns:  None identified at this time.    Social Worker assessment / plan:  CSW met with patient whose wife, Said Rueb, was at bedside.  Mrs. Marsalis advised that patient uses a cane and a walker in the home.  She advised that a home health aid comes in twice a week and assist patient with bathing.  Mrs. Robben indicated that she assist patient with all ADL's.  CSW discussed the SNF recommendation. Mrs. Coldwell stated that she preferred [facility] as they were only about three miles from her home.   Patient wife became tearful and stated "I just love him so much." CSW provided supportive counseling.    Employment status:  Retired Nurse, adult PT Recommendations:  [place] / Referral to community resources:  [place]  Patient/Family's Response to care:  Patient's wife is agreeable for patient to go to SNF.  Patient/Family's Understanding of and Emotional Response to Diagnosis, Current Treatment, and Prognosis:  Patient's wife understands his diagnosis, treatment and prognosis and as a  result is agreeable for patient to go to SNF for rehab.   Emotional Assessment Appearance:  Developmentally appropriate Attitude/Demeanor/Rapport:  Inconsistent Affect (typically observed):  Calm Orientation:  Oriented to Self Alcohol / Substance use:  Not Applicable Psych involvement (Current and /or in the community):  No (Comment)  Discharge Needs  Concerns to be addressed:  Discharge Planning Concerns Readmission within the last 30 days:  No Current discharge risk:  None Barriers to Discharge:  No Barriers Identified   Ihor Gully, LCSW 08/05/2015, 11:30 AM [phone]

## 2015-07-17 NOTE — Evaluation (Signed)
Physical Therapy Evaluation Patient Details Name: Ricky [name] MRN: [id number] DOB: [birth year] Today's Date: 07/26/2015   History of Present Illness  81 yo male h/o stage 4 cancer parotid gland (completed radiation), afib on coumadin, dementia comes in with wife for complaints of chest pain and sob. History obtained from wife and daughter as pt has mild to moderate dementia. No fevers. No n/v/d. Has been sob, and coughing some. No swelling in legs. The last 3 days complaints of his chest hurting and having difficulty breathing but it is difficult for them to tell what is going on because of his dementia. He has been more confused than normal, and more weak than normal since Friday (over 3 days ). Today found to have pna on cxr, referred for admission for pna. Pt normally ambulates with a cane and his level of assistance at home is unknown.  Clinical Impression   Pt was seen for evaluation.  He was cooperative but disoriented to time, place and situation.  He was able to follow simple directions.  He was found to have significant generalized weakness and decreased sitting balance.  He is unable to sit unsupported and needs max assist to transfer bed to chair.  I am recommending SNF due to this significant loss of mobility.    Follow Up Recommendations SNF    Equipment Recommendations  None recommended by PT    Recommendations for Other Services   none    Precautions / Restrictions Precautions Precautions: Fall Restrictions Weight Bearing Restrictions: No      Mobility  Bed Mobility Overal bed mobility: Needs Assistance Bed Mobility: Supine to Sit     Supine to sit: Max assist     General bed mobility comments: Pt had difficulty transferring weight forward to get to the seated position and once seated, fell backwards and had to be pulled forward by therapist.  Transfers Overall transfer level: Needs assistance Equipment used: Rolling walker (2 wheeled) Transfers: Sit  to/from [employer] Sit to Stand: Max assist   Squat pivot transfers: Max assist     General transfer comment: pt was unable to achieve full stance to a walker  Ambulation/Gait             General Gait Details: unable to ambulate                      Balance Overall balance assessment: Needs assistance Sitting-balance support: No upper extremity supported;Feet supported Sitting balance-Leahy Scale: Poor         Standing balance comment: unable to assess                             Pertinent Vitals/Pain Pain Assessment: No/denies pain    Home Living Family/patient expects to be discharged to:: Skilled nursing facility                      Prior Function Level of Independence: Independent with assistive device(s)         Comments: pt ambulated with a cane independently             Extremity/Trunk Assessment               Lower Extremity Assessment: Generalized weakness      Cervical / Trunk Assessment: Kyphotic  Communication   Communication: No difficulties  Cognition Arousal/Alertness: Awake/alert Behavior During Therapy: WFL for tasks assessed/performed Overall Cognitive Status:  History of cognitive impairments - at baseline                                    Assessment/Plan    PT Assessment Patient needs continued PT services  PT Diagnosis Difficulty walking;Generalized weakness   PT Problem List Decreased strength;Decreased activity tolerance;Decreased balance;Decreased mobility;Decreased cognition;Decreased knowledge of use of DME  PT Treatment Interventions Functional mobility training;Therapeutic exercise;Gait training;Balance training   PT Goals (Current goals can be found in the Care Plan section) Acute Rehab PT Goals Patient Stated Goal: none stated PT Goal Formulation: Patient unable to participate in goal setting Time For Goal Achievement: 07/31/15 Potential to  Achieve Goals: Fair    Frequency Min 3X/week   Barriers to discharge   none known                   End of Session Equipment Utilized During Treatment: Gait belt Activity Tolerance: Patient tolerated treatment well Patient left: in chair;with call bell/phone within reach;with chair alarm set Nurse Communication: Mobility status         Time: 0822-0900 PT Time Calculation (min) (ACUTE ONLY): 38 min   Charges:   PT Evaluation $Initial PT Evaluation Tier I: 1 Procedure     PT G CodesDemetrios Isaacs L  PT 07/16/2015, 12:01 PM [phone]

## 2015-07-17 NOTE — ED Notes (Signed)
CRITICAL VALUE ALERT  Critical value received:  PH 7.190                                        pCO2 78.3                                        pO2  75.4                                        Bicarb 28.7                                        Sat. 89%                                        Non-rebreather  Date of notification: 07/25/2015  Time of notification:  1713  Critical value read back: Yes, Cherly Anderson  Nurse who received alert: Charlies Silvers, RN  MD notified (1st page): [place]  Time of first page:  [phone]

## 2015-07-17 NOTE — Care Management Important Message (Signed)
Important Message  Patient Details  Name: Ricky [name] MRN: [id number] Date of Birth: [birth year]   Medicare Important Message Given:  Yes-second notification given    Sherald Barge, RN 07/14/2015, 3:48 PM

## 2015-07-17 NOTE — Discharge Summary (Signed)
Physician Discharge Summary  Ricky [name] STM:[id number] DOB: 1931-09-04 DOA: 07/16/2015  PCP: Olga Millers, MD  Admit date: 07/30/2015 Discharge date: 07/18/2015  Time spent: 45 minutes  Recommendations for Outpatient Follow-up:  -Will be discharged to SNF today. -Has 7 days remaining of Augmentin for HCAP. -Please follow INR to determine when appropriate to restart coumadin. -Recheck renal function in 3 days.   Discharge Diagnoses:  Principal Problem:   PNA (pneumonia) Active Problems:   Atrial fibrillation   Coronary artery disease  s/p CABG EF 55% 2011   Pacemaker-single-chamber [employer]   Cancer of parotid gland   Chronic anticoagulation   Chest pain   Dementia   Confusion   Discharge Condition: Stable and improved  Filed Weights   07/27/2015 1652  Weight: 86.637 kg (191 lb)    History of present illness:  As per Dr. 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 History obtained from wife and daughter as pt has mild to moderate dementia. No fevers. No n/v/d. Has been sob, and coughing some. No swelling in legs. The last 3 days complaints of his chest hurting and having difficulty breathing but it is difficult for them to tell what is going on because of his dementia. He has been more confused than normal, and more weak than normal since Friday (over 3 days ). Today found to have pna on cxr, referred for admission for pna.   Hospital Course:   Acute Encephalopathy -Presumed 2/2 CAP on top of baseline dementia. -Much improved today. -Per wife, close to baseline.  CAP -Cx data remains negative to date. -CP is much improved and presumed related to PNA. -Will transition to augmentin with 7 days remaining at time of DC.  Generalized Weakness -Presumed 2/2 CAP and deconditioning with age and dementia. -PT recommends SNF.  Atrial  Fibrillation -Rate controlled. -Continue coumadin. -INR remains supratherapeutic at 3.99 on DC.  H/o Parotid Gland Cancer -Follow up with oncology as an OP.  ARF -Improving with IVF. -Cr down to 1.42 down from 1.9 on admission. -Recheck in 3 days.  Procedures:  None   Consultations:  None  Discharge Instructions  Discharge Instructions    Diet - low sodium heart healthy    Complete by:  As directed      Increase activity slowly    Complete by:  As directed             Medication List    STOP taking these medications        lisinopril 20 MG tablet  Commonly known as:  PRINIVIL,ZESTRIL      TAKE these medications        acetaminophen 500 MG tablet  Commonly known as:  TYLENOL  Take 500 mg by mouth every 6 (six) hours as needed for pain.     amoxicillin-clavulanate 875-125 MG per tablet  Commonly known as:  AUGMENTIN  Take 1 tablet by mouth 2 (two) times daily.     COMBIGAN 0.2-0.5 % ophthalmic solution  Generic drug:  brimonidine-timolol  Place 1 drop into both eyes every 12 (twelve) hours.     dextromethorphan-guaiFENesin 30-600 MG per 12 hr tablet  Commonly known as:  MUCINEX DM  Take 1 tablet by mouth 2 (two) times daily as needed for cough.     docusate sodium 100 MG capsule  Commonly known as:  COLACE  Take 1 capsule (100 mg total) by mouth every 12 (twelve) hours.     donepezil 10 MG tablet  Commonly known as:  ARICEPT  Take 1 tablet (10 mg total) by mouth every morning.     finasteride 5 MG tablet  Commonly known as:  PROSCAR  Take 5 mg by mouth every morning.     latanoprost 0.005 % ophthalmic solution  Commonly known as:  XALATAN  Place 1 drop into both eyes at bedtime.     memantine 10 MG tablet  Commonly known as:  NAMENDA  Take 1 tablet (10 mg total) by mouth 2 (two) times daily.     QUEtiapine 50 MG tablet  Commonly known as:  SEROQUEL  TAKE 2 TABLETS BY MOUTH DAILY AS DIRECTED     simvastatin 40 MG tablet  Commonly known  as:  ZOCOR  Take 1 tablet (40 mg total) by mouth at bedtime.     warfarin 5 MG tablet  Commonly known as:  COUMADIN  TAKE AS INSTRUCTED BY ANTICOAGULATION CLINIC       No Known Allergies    The results of significant diagnostics from this hospitalization (including imaging, microbiology, ancillary and laboratory) are listed below for reference.    Significant Diagnostic Studies: Dg Chest Portable 1 View  08/06/2015   CLINICAL DATA:  Chest pain for 1 week, cough, history of head neck carcinoma  EXAM: PORTABLE CHEST - 1 VIEW  COMPARISON:  Chest x-ray of 06/25/2013  FINDINGS: The lungs are poorly aerated. There is haziness at both lung bases right greater than left and possibly in the medial left upper lung field suspicious for multifocal pneumonia as well as volume loss. No definite pleural effusion is seen. Heart size is stable and a dual lead permanent pacemaker remains. Median sternotomy sutures are noted.  IMPRESSION: Poor inspiration with bibasilar opacities and possible medial left upper lung opacity. Some of these changes may be due to atelectasis but pneumonia cannot be excluded.   Electronically Signed   By: Ivar Drape M.D.   On: 07/12/2015 17:21    Microbiology: Recent Results (from the past 240 hour(s))  Blood Culture (routine x 2)     Status: None (Preliminary result)   Collection Time: 07/25/2015  6:33 PM  Result Value Ref Range Status   Specimen Description BLOOD RIGHT ARM  Final   Special Requests BOTTLES DRAWN AEROBIC AND ANAEROBIC 6CC  Final   Culture NO GROWTH 3 DAYS  Final   Report Status PENDING  Incomplete  Blood Culture (routine x 2)     Status: None (Preliminary result)   Collection Time: 07/13/2015  6:37 PM  Result Value Ref Range Status   Specimen Description BLOOD RIGHT HAND  Final   Special Requests BOTTLES DRAWN AEROBIC AND ANAEROBIC 6CC  Final   Culture NO GROWTH 3 DAYS  Final   Report Status PENDING  Incomplete     Labs: Basic Metabolic Panel:  Recent  Labs Lab 07/16/2015 1715 07/15/15 0615 07/16/15 0602  NA 140 140 141  K 4.6 4.4 4.6  CL 103 103 105  CO2 28 30 27   GLUCOSE 152* 109* 112*  BUN 38* 36* 38*  CREATININE 1.97* 1.66* 1.42*  CALCIUM 9.6 9.1 9.3   Liver Function Tests: No results for input(s): AST, ALT, ALKPHOS, BILITOT, PROT, ALBUMIN in the last 168 hours. No results for input(s): LIPASE, AMYLASE in the last 168 hours. No results for input(s): AMMONIA in the last 168 hours. CBC:  Recent Labs Lab 07/23/2015 1715 07/15/15 0615 07/16/15 0602  WBC 9.3 7.8 9.7  HGB 13.9 13.0 14.2  HCT 41.4 39.4 43.9  MCV 95.2 95.9 96.1  PLT 267 261 335   Cardiac Enzymes:  Recent Labs Lab 07/23/2015 1715 07/19/2015 2310 07/15/15 0615 07/15/15 1058  TROPONINI <0.03 <0.03 <0.03 <0.03   BNP: BNP (last 3 results)  Recent Labs  07/10/2015 1715  BNP 133.0*    ProBNP (last 3 results) No results for input(s): PROBNP in the last 8760 hours.  CBG: No results for input(s): GLUCAP in the last 168 hours.     SignedLelon Frohlich  [facility] Pager: [phone] 07/09/2015, 11:49 AM

## 2015-07-17 NOTE — ED Notes (Signed)
Family at the bedside, pt has right sided face drooping, baseline per family

## 2015-07-17 NOTE — ED Notes (Signed)
Called RT, MD at the bedside, will be ordering Bipap

## 2015-07-17 NOTE — ED Notes (Signed)
Pt discharged from inpatient unit ~2 hour prior to this arrival. Resident of [facility]. Low O2 sats.NRB in place PTA . Pt was 84% on 2L at facility. AMS as well which began PTA as well.

## 2015-07-17 NOTE — Progress Notes (Signed)
ANTICOAGULATION CONSULT NOTE - Follow Up Consult  Pharmacy Consult for Coumadin (chronic Rx PTA) Indication: atrial fibrillation  No Known Allergies  Patient Measurements: Height: 6' (182.9 cm) Weight: 191 lb (86.637 kg) IBW/kg (Calculated) : 77.6  Vital Signs:    Labs:  Recent Labs  08/03/2015 1715 07/27/2015 2310 07/15/15 0615 07/15/15 1058 07/16/15 0602 07/16/15 1210 07/16/2015 0608  HGB 13.9  --  13.0  --  14.2  --   --   HCT 41.4  --  39.4  --  43.9  --   --   PLT 267  --  261  --  335  --   --   LABPROT 36.3*  --   --   --   --  42.2* 37.9*  INR 3.77*  --   --   --   --  4.60* 3.99*  CREATININE 1.97*  --  1.66*  --  1.42*  --   --   TROPONINI <0.03 <0.03 <0.03 <0.03  --   --   --    Estimated Creatinine Clearance: 44.8 mL/min (by C-G formula based on Cr of 1.42).  Medications:  Prescriptions prior to admission  Medication Sig Dispense Refill Last Dose  . acetaminophen (TYLENOL) 500 MG tablet Take 500 mg by mouth every 6 (six) hours as needed for pain.    Past Week at Unknown time  . brimonidine-timolol (COMBIGAN) 0.2-0.5 % ophthalmic solution Place 1 drop into both eyes every 12 (twelve) hours.   07/12/2015 at Unknown time  . dextromethorphan-guaiFENesin (MUCINEX DM) 30-600 MG per 12 hr tablet Take 1 tablet by mouth 2 (two) times daily as needed for cough.    Past Week at Unknown time  . docusate sodium (COLACE) 100 MG capsule Take 1 capsule (100 mg total) by mouth every 12 (twelve) hours. 60 capsule 0 07/19/2015 at Unknown time  . donepezil (ARICEPT) 10 MG tablet Take 1 tablet (10 mg total) by mouth every morning. 90 tablet 3 07/28/2015 at Unknown time  . finasteride (PROSCAR) 5 MG tablet Take 5 mg by mouth every morning.    07/15/2015 at Unknown time  . latanoprost (XALATAN) 0.005 % ophthalmic solution Place 1 drop into both eyes at bedtime.    07/13/2015 at Unknown time  . lisinopril (PRINIVIL,ZESTRIL) 20 MG tablet Take 1 tablet (20 mg total) by mouth daily. 90 tablet 3  08/07/2015 at Unknown time  . memantine (NAMENDA) 10 MG tablet Take 1 tablet (10 mg total) by mouth 2 (two) times daily. 180 tablet 3 07/10/2015 at Unknown time  . QUEtiapine (SEROQUEL) 50 MG tablet TAKE 2 TABLETS BY MOUTH DAILY AS DIRECTED (Patient taking differently: TAKE ONE TABLET BY MOUTH TWICE DAILY) 60 tablet 0 08/07/2015 at Unknown time  . simvastatin (ZOCOR) 40 MG tablet Take 1 tablet (40 mg total) by mouth at bedtime. 90 tablet 3 07/13/2015 at Unknown time  . warfarin (COUMADIN) 5 MG tablet TAKE AS INSTRUCTED BY ANTICOAGULATION CLINIC (Patient taking differently: TAKE AS INSTRUCTED BY ANTICOAGULATION CLINIC: 2.5MG  BY MOUTH ON TUESDAYS, THURSDAYS, AND SATURDAYS, THEN TAKE 5MG  ON ALL OTHER DAYS) 35 tablet 3 07/13/2015 at 1800    Assessment: 81yo male on chronic Coumadin PTA for h/o afib.  INR SUPRAtherapeutic on admission and remains elevated despite no Coumadin given while in hospital.   Records from Coumadin clinic noted.    Anticoagulation Monitoring 07/08/2015  INR goal 2.0-3.0  Assoc. INR Date 07/08/2015  Associated INR 3.3  Pt. deviation No  Current weekly dose 27.5  mg  Sunday dose 5 mg  Monday dose 5 mg  Tuesday dose 2.5 mg  Wednesday dose 5 mg  Thursday dose 2.5 mg  Friday dose 5 mg  Saturday dose 2.5 mg  Weekly dose 27.5 mg  Dose description Skip today's dose, then continue taking 1 tablet (5mg ) every day except 1/2 tablet (2.5mg ) on Tuesdays Thursdays and Saturdays. Recheck in 3 weeks.  Return date 07/30/2015  VISIT REPORT     Goal of Therapy:  INR 2-3 Monitor platelets by anticoagulation protocol: Yes   Plan:  HOLD coumadin again today INR daily  Hart Robinsons A 07/24/2015,10:56 AM

## 2015-07-18 ENCOUNTER — Inpatient Hospital Stay (HOSPITAL_COMMUNITY): Payer: Medicare Other

## 2015-07-18 DIAGNOSIS — N183 Chronic kidney disease, stage 3 (moderate): Secondary | ICD-10-CM

## 2015-07-18 DIAGNOSIS — I509 Heart failure, unspecified: Secondary | ICD-10-CM

## 2015-07-18 LAB — COMPREHENSIVE METABOLIC PANEL
ALK PHOS: 84 U/L (ref 38–126)
ALT: 10 U/L — ABNORMAL LOW (ref 17–63)
ANION GAP: 11 (ref 5–15)
AST: 24 U/L (ref 15–41)
Albumin: 2.8 g/dL — ABNORMAL LOW (ref 3.5–5.0)
BILIRUBIN TOTAL: 0.5 mg/dL (ref 0.3–1.2)
BUN: 49 mg/dL — ABNORMAL HIGH (ref 6–20)
CALCIUM: 9.6 mg/dL (ref 8.9–10.3)
CO2: 26 mmol/L (ref 22–32)
Chloride: 107 mmol/L (ref 101–111)
Creatinine, Ser: 1.59 mg/dL — ABNORMAL HIGH (ref 0.61–1.24)
GFR calc Af Amer: 45 mL/min — ABNORMAL LOW (ref >60)
GFR, EST NON AFRICAN AMERICAN: 39 mL/min — AB (ref >60)
GLUCOSE: 127 mg/dL — AB (ref 65–99)
POTASSIUM: 4.5 mmol/L (ref 3.5–5.1)
Sodium: 144 mmol/L (ref 135–145)
TOTAL PROTEIN: 6.5 g/dL (ref 6.5–8.1)

## 2015-07-18 LAB — PROCALCITONIN: Procalcitonin: 0.19 ng/mL

## 2015-07-18 LAB — CBC WITH DIFFERENTIAL/PLATELET
BASOS PCT: 0 % (ref 0–1)
Basophils Absolute: 0 10*3/uL (ref 0.0–0.1)
EOS PCT: 0 % (ref 0–5)
Eosinophils Absolute: 0 10*3/uL (ref 0.0–0.7)
HCT: 43.7 % (ref 39.0–52.0)
Hemoglobin: 13.8 g/dL (ref 13.0–17.0)
LYMPHS PCT: 7 % — AB (ref 12–46)
Lymphs Abs: 0.9 10*3/uL (ref 0.7–4.0)
MCH: 31.1 pg (ref 26.0–34.0)
MCHC: 31.6 g/dL (ref 30.0–36.0)
MCV: 98.4 fL (ref 78.0–100.0)
MONO ABS: 1.1 10*3/uL — AB (ref 0.1–1.0)
Monocytes Relative: 9 % (ref 3–12)
NEUTROS ABS: 10.6 10*3/uL — AB (ref 1.7–7.7)
Neutrophils Relative %: 84 % — ABNORMAL HIGH (ref 43–77)
PLATELETS: 405 10*3/uL — AB (ref 150–400)
RBC: 4.44 MIL/uL (ref 4.22–5.81)
RDW: 13.5 % (ref 11.5–15.5)
WBC: 12.7 10*3/uL — AB (ref 4.0–10.5)

## 2015-07-18 LAB — GLUCOSE, CAPILLARY
GLUCOSE-CAPILLARY: 143 mg/dL — AB (ref 65–99)
Glucose-Capillary: 111 mg/dL — ABNORMAL HIGH (ref 65–99)
Glucose-Capillary: 121 mg/dL — ABNORMAL HIGH (ref 65–99)
Glucose-Capillary: 123 mg/dL — ABNORMAL HIGH (ref 65–99)

## 2015-07-18 LAB — PROTIME-INR
INR: 4.23 — ABNORMAL HIGH (ref 0.00–1.49)
Prothrombin Time: 39.7 seconds — ABNORMAL HIGH (ref 11.6–15.2)

## 2015-07-18 LAB — TROPONIN I
TROPONIN I: 0.04 ng/mL — AB (ref <0.031)
TROPONIN I: 0.04 ng/mL — AB (ref <0.031)

## 2015-07-18 LAB — LACTIC ACID, PLASMA: LACTIC ACID, VENOUS: 2.2 mmol/L — AB (ref 0.5–2.0)

## 2015-07-18 LAB — MRSA PCR SCREENING: MRSA BY PCR: NEGATIVE

## 2015-07-18 MED ORDER — ALBUTEROL SULFATE (2.5 MG/3ML) 0.083% IN NEBU
INHALATION_SOLUTION | RESPIRATORY_TRACT | Status: AC
  Administered 2015-07-18: 2.5 mg
  Filled 2015-07-18: qty 3

## 2015-07-18 MED ORDER — FUROSEMIDE 10 MG/ML IJ SOLN
40.0000 mg | Freq: Two times a day (BID) | INTRAMUSCULAR | Status: DC
  Administered 2015-07-18: 40 mg via INTRAVENOUS
  Filled 2015-07-18: qty 4

## 2015-07-18 MED ORDER — VANCOMYCIN HCL IN DEXTROSE 1-5 GM/200ML-% IV SOLN
1000.0000 mg | INTRAVENOUS | Status: DC
  Administered 2015-07-19 – 2015-07-21 (×3): 1000 mg via INTRAVENOUS
  Filled 2015-07-18 (×4): qty 200

## 2015-07-18 MED ORDER — CHLORHEXIDINE GLUCONATE 0.12 % MT SOLN
15.0000 mL | Freq: Two times a day (BID) | OROMUCOSAL | Status: DC
  Administered 2015-07-18 – 2015-07-20 (×6): 15 mL via OROMUCOSAL
  Filled 2015-07-18 (×6): qty 15

## 2015-07-18 MED ORDER — INSULIN ASPART 100 UNIT/ML ~~LOC~~ SOLN
0.0000 [IU] | SUBCUTANEOUS | Status: DC
  Administered 2015-07-18 – 2015-07-19 (×4): 1 [IU] via SUBCUTANEOUS
  Administered 2015-07-19: 2 [IU] via SUBCUTANEOUS
  Administered 2015-07-19: 1 [IU] via SUBCUTANEOUS
  Administered 2015-07-19: 2 [IU] via SUBCUTANEOUS
  Administered 2015-07-20 (×4): 1 [IU] via SUBCUTANEOUS
  Administered 2015-07-21: 2 [IU] via SUBCUTANEOUS
  Administered 2015-07-21: 1 [IU] via SUBCUTANEOUS
  Administered 2015-07-21: 2 [IU] via SUBCUTANEOUS
  Administered 2015-07-21: 1 [IU] via SUBCUTANEOUS

## 2015-07-18 MED ORDER — CETYLPYRIDINIUM CHLORIDE 0.05 % MT LIQD
7.0000 mL | Freq: Two times a day (BID) | OROMUCOSAL | Status: DC
  Administered 2015-07-18 – 2015-07-21 (×7): 7 mL via OROMUCOSAL

## 2015-07-18 MED ORDER — PIPERACILLIN-TAZOBACTAM 3.375 G IVPB
INTRAVENOUS | Status: AC
  Filled 2015-07-18: qty 50

## 2015-07-18 MED ORDER — METHYLPREDNISOLONE SODIUM SUCC 40 MG IJ SOLR
40.0000 mg | Freq: Four times a day (QID) | INTRAMUSCULAR | Status: DC
  Administered 2015-07-18 – 2015-07-21 (×13): 40 mg via INTRAVENOUS
  Filled 2015-07-18 (×13): qty 1

## 2015-07-18 NOTE — Progress Notes (Addendum)
[facility] PROGRESS NOTE  Ricky [name] OFB:[id number] DOB: 1931/10/16 DOA: 07/19/2015 PCP: Olga Millers, MD  Assessment/Plan: 1. Acute respiratory failure with hypoxia and hypercapnia. Patient was recently discharged from the hospital after being treated for pneumonia. He had to return to the hospital the same day with worsening respiratory distress. ABG showed respiratory acidosis and hypoxia. He was started on BiPAP with improvement of ABG parameters although he still has a significant oxygen requirement. He still has significant hypoxia and increased work of breathing. Chest x-ray shows possible pneumonia versus CHF. Will request pulmonology input. 2. Healthcare associated pneumonia. Patient started on vancomycin and Zosyn. We'll de-escalate antibiotic since his clinical condition improves. Question if he had an episode of aspiration with sudden decline in respiratory status. Will check swallow evaluation. 3. Acute CHF, likely diastolic. Patient has elevated BNP. He has been started on intravenous Lasix. Will have to monitor fluid status closely. Check echocardiogram to assess ejection fraction. Continue to monitor intake and output. 4. Acute encephalopathy. Likely related to hypercapnia. Appears to be improving with BiPAP therapy. Continue current treatments. 5. Dementia. Patient has baseline dementia. Will continue Aricept and Namenda. 6. Chronic atrial fibrillation. Status post pacemaker. He is anticoagulated with Coumadin. 7. CKD stage III. Creatinine appears to be her baseline. Continue to follow the setting of diuresis. 8. Demand ischemia. Mild elevation of troponins in the setting of acute illness. Echocardiogram has been ordered. 9. 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 Patient completed radiation therapy. Wife reports that he was not a candidate for chemotherapy.  Code Status: DNR Family Communication: discussed with wife at the  bedside Disposition Plan: pending hospital course   Consultants:    Procedures:    Antibiotics:  Vancomycin 9/19>>  Zosyn 9/9>>  HPI/Subjective: Patient is on bipap. Denies any pain. History limited.  Objective: Filed Vitals:   07/18/15 0858  BP:   Pulse: 78  Temp:   Resp: 29    Intake/Output Summary (Last 24 hours) at 07/18/15 0955 Last data filed at 07/18/15 0425  Gross per 24 hour  Intake     53 ml  Output    603 ml  Net   -550 ml   Filed Weights   07/27/2015 2230  Weight: 78.1 kg (172 lb 2.9 oz)    Exam:   General:  On bipap, increased respiratory effort  Cardiovascular: s1, s2, rrr  Respiratory: coarse breath sounds at bases  Abdomen: soft, nt, nd, bs+  Musculoskeletal: no edema b/l   Data Reviewed: Basic Metabolic Panel:  Recent Labs Lab 08/01/2015 1715 07/15/15 0615 07/16/15 0602 07/23/2015 1705 07/18/15 0509  NA 140 140 141 143 144  K 4.6 4.4 4.6 4.8 4.5  CL 103 103 105 109 107  CO2 28 30 27 27 26   GLUCOSE 152* 109* 112* 170* 127*  BUN 38* 36* 38* 45* 49*  CREATININE 1.97* 1.66* 1.42* 1.41* 1.59*  CALCIUM 9.6 9.1 9.3 9.4 9.6   Liver Function Tests:  Recent Labs Lab 07/23/2015 1705 07/18/15 0509  AST 27 24  ALT 12* 10*  ALKPHOS 91 84  BILITOT 0.6 0.5  PROT 7.1 6.5  ALBUMIN 2.8* 2.8*   No results for input(s): LIPASE, AMYLASE in the last 168 hours. No results for input(s): AMMONIA in the last 168 hours. CBC:  Recent Labs Lab 07/19/2015 1715 07/15/15 0615 07/16/15 0602 07/28/2015 1705 07/18/15 0509  WBC 9.3 7.8 9.7 12.4* 12.7*  NEUTROABS  --   --   --  10.9* 10.6*  HGB 13.9 13.0 14.2 15.1 13.8  HCT 41.4 39.4 43.9 46.6 43.7  MCV 95.2 95.9 96.1 99.4 98.4  PLT 267 261 335 464* 405*   Cardiac Enzymes:  Recent Labs Lab 07/15/15 0615 07/15/15 1058 07/16/2015 1705 07/30/2015 2233 07/18/15 0509  TROPONINI <0.03 <0.03 0.08* 0.08* 0.04*   BNP (last 3 results)  Recent Labs  07/26/2015 1715 08/02/2015 1705  BNP 133.0*  1580.0*    ProBNP (last 3 results) No results for input(s): PROBNP in the last 8760 hours.  CBG:  Recent Labs Lab 07/18/15 0800  GLUCAP 111*    Recent Results (from the past 240 hour(s))  Blood Culture (routine x 2)     Status: None (Preliminary result)   Collection Time: 07/25/2015  6:33 PM  Result Value Ref Range Status   Specimen Description BLOOD RIGHT ARM  Final   Special Requests BOTTLES DRAWN AEROBIC AND ANAEROBIC 6CC  Final   Culture NO GROWTH 4 DAYS  Final   Report Status PENDING  Incomplete  Blood Culture (routine x 2)     Status: None (Preliminary result)   Collection Time: 07/31/2015  6:37 PM  Result Value Ref Range Status   Specimen Description BLOOD RIGHT HAND  Final   Special Requests BOTTLES DRAWN AEROBIC AND ANAEROBIC 6CC  Final   Culture NO GROWTH 4 DAYS  Final   Report Status PENDING  Incomplete  Culture, blood (routine x 2)     Status: None (Preliminary result)   Collection Time: 07/20/2015  5:05 PM  Result Value Ref Range Status   Specimen Description BLOOD RIGHT ARM  Final   Special Requests   Final    BOTTLES DRAWN AEROBIC AND ANAEROBIC AEB 6CC ANA 4CC   Culture NO GROWTH < 24 HOURS  Final   Report Status PENDING  Incomplete  Culture, blood (routine x 2)     Status: None (Preliminary result)   Collection Time: 07/15/2015  5:10 PM  Result Value Ref Range Status   Specimen Description BLOOD RIGHT HAND  Final   Special Requests   Final    BOTTLES DRAWN AEROBIC AND ANAEROBIC AEB 6CC ANA 4CC   Culture NO GROWTH < 24 HOURS  Final   Report Status PENDING  Incomplete     Studies: Ct Head Wo Contrast  07/19/2015   CLINICAL DATA:  Patient was in the hospital for health care acquired pneumonia and was discharged 30 minutes prior to admission. Patient was found by the paramedics to have low oxygen saturation and altered level of consciousness. Acute encephalopathy.  EXAM: CT HEAD WITHOUT CONTRAST  TECHNIQUE: Contiguous axial images were obtained from the base of the  skull through the vertex without intravenous contrast.  COMPARISON:  12/19/2013  FINDINGS: Diffuse cerebral atrophy. Ventricular dilatation consistent with central atrophy. Low-attenuation changes throughout the deep white matter consistent with small vessel ischemia. No mass effect or midline shift. No abnormal extra-axial fluid collections. Gray-white matter junctions are distinct. Basal cisterns are not effaced. No evidence of acute intracranial hemorrhage. No depressed skull fractures. Opacification of the right mastoid air cells. Mucosal thickening in the paranasal sinuses. Vascular calcifications. Subcutaneous scalp hematoma over the right frontotemporal region.  IMPRESSION: No acute intracranial abnormalities. Chronic atrophy and small vessel ischemic changes. Opacification of the right mastoid air cells.   Electronically Signed   By: Lucienne Capers M.D.   On: 07/10/2015 22:17   Dg Chest Port 1 View  08/06/2015   CLINICAL DATA:  Shortness of breath  with decreased oxygen saturation  EXAM: PORTABLE CHEST - 1 VIEW  COMPARISON:  July 14, 2015  FINDINGS: There is airspace consolidation in both lung bases, essentially stable. There is a small effusion on the right. Heart is mildly enlarged with pulmonary vascularity within normal limits. Pacemaker leads are attached to the right atrium and right ventricle. No adenopathy. Patient is status post median sternotomy.  IMPRESSION: Bibasilar airspace consolidation. Cardiomegaly with small effusion. All of these findings may be indicative of congestive heart failure. Superimposed pneumonia in the bases, however, cannot be excluded. Both entities may exist concurrently.   Electronically Signed   By: Lowella Grip III M.D.   On: 08/05/2015 17:29    Scheduled Meds: . antiseptic oral rinse  7 mL Mouth Rinse q12n4p  . brimonidine  1 drop Both Eyes Q12H   And  . timolol  1 drop Both Eyes BID  . chlorhexidine  15 mL Mouth Rinse BID  . docusate sodium  100 mg  Oral BID  . donepezil  10 mg Oral Daily  . finasteride  5 mg Oral Daily  . furosemide  40 mg Intravenous Daily  . latanoprost  1 drop Both Eyes QHS  . memantine  10 mg Oral BID  . piperacillin-tazobactam (ZOSYN)  IV  3.375 g Intravenous Q8H  . QUEtiapine  50 mg Oral BID  . simvastatin  40 mg Oral QHS  . sodium chloride  3 mL Intravenous Q12H  . vancomycin  750 mg Intravenous Q12H  . Warfarin - Pharmacist Dosing Inpatient   Does not apply Q24H   Continuous Infusions:   Principal Problem:   Acute respiratory failure with hypoxia and hypercarbia Active Problems:   Obstructive sleep apnea   Atrial fibrillation   Coronary artery disease  s/p CABG EF 55% 2011   Hyperglycemia   HCAP (healthcare-associated pneumonia)   Dementia   CKD (chronic kidney disease) stage 3, GFR 30-59 ml/min   Acute encephalopathy   Acute respiratory failure   CHF (congestive heart failure)    Time spent: 51mins    Sharde Gover  [facility] Pager [phone]. If 7PM-7AM, please contact night-coverage at [email or web address], password [email or web address] 07/18/2015, 9:55 AM  LOS: 1 day

## 2015-07-18 NOTE — Progress Notes (Signed)
PT IS DROPPING HIS SATS W/O AN EXCERTION OR SPEECH. R.T. CALLED. O2 SATS ONLY UP TO 90'S WHEN FIO2 INCREASED TO 100%, ECHO PREFORMED. WIFE REMAINS AT BEDSIDE.

## 2015-07-18 NOTE — Progress Notes (Signed)
  Echocardiogram 2D Echocardiogram has been performed.  Lysle Rubens 07/18/2015, 3:35 PM

## 2015-07-18 NOTE — Progress Notes (Signed)
ANTICOAGULATION CONSULT NOTE - Follow Up Consult  Pharmacy Consult for Coumadin (chronic Rx PTA) Indication: atrial fibrillation  No Known Allergies  Patient Measurements: Height: 6' (182.9 cm) Weight: 172 lb 2.9 oz (78.1 kg) IBW/kg (Calculated) : 77.6  Vital Signs: Temp: 98.6 F (37 C) (09/10 0700) Temp Source: Axillary (09/10 0001) BP: 115/68 mmHg (09/10 0700) Pulse Rate: 78 (09/10 0858)  Labs:  Recent Labs  07/16/15 0602  07/19/2015 0608 07/25/2015 1705 07/23/2015 2233 07/18/15 0509  HGB 14.2  --   --  15.1  --  13.8  HCT 43.9  --   --  46.6  --  43.7  PLT 335  --   --  464*  --  405*  LABPROT  --   < > 37.9* 38.6*  --  39.7*  INR  --   < > 3.99* 4.09*  --  4.23*  CREATININE 1.42*  --   --  1.41*  --  1.59*  TROPONINI  --   --   --  0.08* 0.08* 0.04*  < > = values in this interval not displayed. Estimated Creatinine Clearance: 40 mL/min (by C-G formula based on Cr of 1.59).  Medications:  Prescriptions prior to admission  Medication Sig Dispense Refill Last Dose  . brimonidine-timolol (COMBIGAN) 0.2-0.5 % ophthalmic solution Place 1 drop into both eyes every 12 (twelve) hours.   Past Week at Unknown time  . docusate sodium (COLACE) 100 MG capsule Take 1 capsule (100 mg total) by mouth every 12 (twelve) hours. 60 capsule 0 Past Week at Unknown time  . donepezil (ARICEPT) 10 MG tablet Take 1 tablet (10 mg total) by mouth every morning. 90 tablet 3 Past Week at Unknown time  . finasteride (PROSCAR) 5 MG tablet Take 5 mg by mouth every morning.    Past Week at Unknown time  . latanoprost (XALATAN) 0.005 % ophthalmic solution Place 1 drop into both eyes at bedtime.    Past Week at Unknown time  . memantine (NAMENDA) 10 MG tablet Take 1 tablet (10 mg total) by mouth 2 (two) times daily. 180 tablet 3 Past Week at Unknown time  . QUEtiapine (SEROQUEL) 50 MG tablet TAKE 2 TABLETS BY MOUTH DAILY AS DIRECTED (Patient taking differently: TAKE ONE TABLET BY MOUTH TWICE DAILY) 60 tablet  0 Past Week at Unknown time  . simvastatin (ZOCOR) 40 MG tablet Take 1 tablet (40 mg total) by mouth at bedtime. 90 tablet 3 Past Week at Unknown time  . warfarin (COUMADIN) 5 MG tablet TAKE AS INSTRUCTED BY ANTICOAGULATION CLINIC (Patient taking differently: TAKE AS INSTRUCTED BY ANTICOAGULATION CLINIC: 2.5MG  BY MOUTH ON TUESDAYS, THURSDAYS, AND SATURDAYS, THEN TAKE 5MG  ON ALL OTHER DAYS) 35 tablet 3 Past Week at Unknown time  . acetaminophen (TYLENOL) 500 MG tablet Take 500 mg by mouth every 6 (six) hours as needed for pain.    unknown  . amoxicillin-clavulanate (AUGMENTIN) 875-125 MG per tablet Take 1 tablet by mouth 2 (two) times daily. 14 tablet 0 never  . dextromethorphan-guaiFENesin (MUCINEX DM) 30-600 MG per 12 hr tablet Take 1 tablet by mouth 2 (two) times daily as needed for cough.    unknown    Assessment: 81yo male on chronic Coumadin PTA for h/o afib.  INR SUPRAtherapeutic on admission and remains elevated despite no Coumadin given while in hospital.   Records from Coumadin clinic noted.    Anticoagulation Monitoring 07/08/2015  INR goal 2.0-3.0  Assoc. INR Date 07/08/2015  Associated INR 3.3  Pt. deviation No  Current weekly dose 27.5 mg  Sunday dose 5 mg  Monday dose 5 mg  Tuesday dose 2.5 mg  Wednesday dose 5 mg  Thursday dose 2.5 mg  Friday dose 5 mg  Saturday dose 2.5 mg  Weekly dose 27.5 mg  Dose description Skip today's dose, then continue taking 1 tablet (5mg ) every day except 1/2 tablet (2.5mg ) on Tuesdays Thursdays and Saturdays. Recheck in 3 weeks.  Return date 07/30/2015  VISIT REPORT     Goal of Therapy:  INR 2-3   Plan:  HOLD coumadin again today INR daily  Pricilla Larsson 07/18/2015,10:38 AM

## 2015-07-18 NOTE — Progress Notes (Signed)
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 Brought from NH with ARDS and AMS. Chest xray shows CHF vs PNA. Poor prognosis  RD operating remotely. Spoke with family and pt on Wednesday. At that time pt/family reported very good intake and a normal weight of 180 lbs.  They reported no n/v/c/d at that time  His weight at that time was 191. Yesterday he was readmitted at 172. This would indicate a severe fluid loss.  He has lost 11.3% of his bw in the past year,  Likely related to his cancer/cancer treatments. His weight seems to have stabilized in the past 6 months, with the exception of his weight on last admission.   Diet Order:  Diet Heart Room service appropriate?: Yes; Fluid consistency:: Thin  Skin:  MSAD to sacrum. Ecchymosis, and abrasions  Last BM:  9/8  Height:  Ht Readings from Last 1 Encounters:  07/14/2015 6' (1.829 m)   Weight:  Wt Readings from Last 1 Encounters:  07/29/2015 172 lb 2.9 oz (78.1 kg)   Wt Readings from Last 10 Encounters:  08/01/2015 172 lb 2.9 oz (78.1 kg)  07/19/2015 191 lb (86.637 kg)  05/28/15 175 lb 9.6 oz (79.652 kg)  05/27/15 179 lb (81.194 kg)  05/18/15 176 lb (79.833 kg)  02/26/15 170 lb 3.2 oz (77.202 kg)  02/12/15 170 lb 8 oz (77.338 kg)  01/20/15  171 lb 3.2 oz (77.656 kg)  01/06/15 176 lb (79.833 kg)  12/30/14 176 lb (79.833 kg)   Ideal Body Weight:  72 kg  BMI:  Body mass index is 23.35 kg/(m^2).  Estimated Nutritional Needs:  Kcal:  1650-1800 (19-21 kcal/kg) Protein:  81-97 (1-1.2 g/kg) Fluid:  1.7-1.8 liters  EDUCATION NEEDS:  No education needs identified at this time  Burtis Junes RD, LDN Nutrition Pager: [phone] 07/18/2015 8:26 AM

## 2015-07-18 NOTE — Progress Notes (Signed)
ANTIBIOTIC CONSULT NOTE - FOLLOW UP  Pharmacy Consult for Vancomycin and Zosyn  Indication: rule out sepsis  / HCAP  No Known Allergies  Patient Measurements: Height: 6' (182.9 cm) Weight: 172 lb 2.9 oz (78.1 kg) IBW/kg (Calculated) : 77.6   Vital Signs: Temp: 98.6 F (37 C) (09/10 0700) Temp Source: Axillary (09/10 0001) BP: 115/68 mmHg (09/10 0700) Pulse Rate: 78 (09/10 0858) Intake/Output from previous day: 09/09 0701 - 09/10 0700 In: 53 [I.V.:3; IV Piggyback:50] Out: 603 [Urine:603] Intake/Output from this shift:    Labs:  Recent Labs  07/16/15 0602 07/16/2015 1705 07/18/15 0509  WBC 9.7 12.4* 12.7*  HGB 14.2 15.1 13.8  PLT 335 464* 405*  CREATININE 1.42* 1.41* 1.59*   Estimated Creatinine Clearance: 40 mL/min (by C-G formula based on Cr of 1.59). No results for input(s): VANCOTROUGH, VANCOPEAK, VANCORANDOM, GENTTROUGH, GENTPEAK, GENTRANDOM, TOBRATROUGH, TOBRAPEAK, TOBRARND, AMIKACINPEAK, AMIKACINTROU, AMIKACIN in the last 72 hours.   Microbiology: Recent Results (from the past 720 hour(s))  Blood Culture (routine x 2)     Status: None (Preliminary result)   Collection Time: 08/06/2015  6:33 PM  Result Value Ref Range Status   Specimen Description BLOOD RIGHT ARM  Final   Special Requests BOTTLES DRAWN AEROBIC AND ANAEROBIC 6CC  Final   Culture NO GROWTH 4 DAYS  Final   Report Status PENDING  Incomplete  Blood Culture (routine x 2)     Status: None (Preliminary result)   Collection Time: 07/19/2015  6:37 PM  Result Value Ref Range Status   Specimen Description BLOOD RIGHT HAND  Final   Special Requests BOTTLES DRAWN AEROBIC AND ANAEROBIC 6CC  Final   Culture NO GROWTH 4 DAYS  Final   Report Status PENDING  Incomplete  Culture, blood (routine x 2)     Status: None (Preliminary result)   Collection Time: 07/31/2015  5:05 PM  Result Value Ref Range Status   Specimen Description BLOOD RIGHT ARM  Final   Special Requests   Final    BOTTLES DRAWN AEROBIC AND  ANAEROBIC AEB 6CC ANA 4CC   Culture NO GROWTH < 24 HOURS  Final   Report Status PENDING  Incomplete  Culture, blood (routine x 2)     Status: None (Preliminary result)   Collection Time: 08/06/2015  5:10 PM  Result Value Ref Range Status   Specimen Description BLOOD RIGHT HAND  Final   Special Requests   Final    BOTTLES DRAWN AEROBIC AND ANAEROBIC AEB 6CC ANA 4CC   Culture NO GROWTH < 24 HOURS  Final   Report Status PENDING  Incomplete    Anti-infectives    Start     Dose/Rate Route Frequency Ordered Stop   07/19/15 1000  vancomycin (VANCOCIN) IVPB 1000 mg/200 mL premix     1,000 mg 200 mL/hr over 60 Minutes Intravenous Every 24 hours 07/18/15 1043     07/18/15 0900  vancomycin (VANCOCIN) IVPB 750 mg/150 ml premix  Status:  Discontinued     750 mg 150 mL/hr over 60 Minutes Intravenous Every 12 hours 07/12/2015 2220 07/18/15 1043   07/18/15 0400  piperacillin-tazobactam (ZOSYN) IVPB 3.375 g     3.375 g 12.5 mL/hr over 240 Minutes Intravenous Every 8 hours 07/12/2015 2220     08/05/2015 1815  piperacillin-tazobactam (ZOSYN) IVPB 3.375 g     3.375 g 100 mL/hr over 30 Minutes Intravenous  Once 07/27/2015 1801 07/16/2015 1904   07/31/2015 1815  vancomycin (VANCOCIN) IVPB 1000 mg/200 mL premix  1,000 mg 200 mL/hr over 60 Minutes Intravenous  Once 07/14/2015 1801 07/16/2015 2004      Assessment: Okay for Protocol, recent admission.  Initial doses given in ED.  Renal labs a little worse this AM.  Goal of Therapy:  Vancomycin trough level 15-20 mcg/ml  Plan:  Zosyn 3.375gm IV every 8 hours. Follow-up micro data, labs, vitals.  Decrease Vancomycin to 1000mg  IV every 24 hours. Measure antibiotic drug levels at steady state Follow up culture results  Biagio Quint R 07/18/2015,10:45 AM

## 2015-07-18 NOTE — Consult Note (Signed)
Consult requested by: [facility] Consult requested for acute on chronic respiratory failure:  HPI: This is an 81 year old who is a resident of a skilled care facility and who was in the hospital with pneumonia and was being sent back to the skilled care facility when he had acute deterioration, respiratory distress and came back to the emergency department. In the emergency department he was noted to have respiratory failure was placed on BiPAP. Chest x-ray shows what may be a combination of pneumonia and heart failure and heme may have aspirated. He remains on BiPAP and still has some respiratory distress despite that although his blood gas has improved.  Past Medical History  Diagnosis Date  . Obstructive sleep apnea     noncompliant with CPAP  . Ischemic cardiomyopathy   . Atrial fibrillation     on anticoag  . AV block, complete s/p ablation   . Pacemaker [place]   . Cerebrovascular disease   . Hypercholesterolemia   . Hiatal hernia   . Gastritis   . Diverticulosis   . Colonic polyp   . BPH (benign prostatic hypertrophy)   . DJD (degenerative joint disease)   . Memory loss   . Anxiety   . Anemia   . Shingles   . Carotid artery occlusion     s/p R CEA 08/2003, s/p L CEA 06/2013  . Hypertension   . Pneumonia     hx of  . Parkinsonian features   . CAD (coronary artery disease) 2000    s/p CABG x 4 07/1999  . Glaucoma   . Radiation 11/20/14-01/07/15    right face/neck 60 gray  . Cancer of parotid gland 09/2014 approx    right     Family History  Problem Relation Age of Onset  . Coronary artery disease Other      Social History   Social History  . Marital Status: Married    Spouse Name: arrie  . Number of Children: 1  . Years of Education: N/A   Occupational History  . retired   .     Social History Main Topics  . Smoking status: Former Smoker -- 1.50 packs/day for 50 years    Types: Cigarettes    Quit date: 11/08/1995  . Smokeless tobacco: Never  Used  . Alcohol Use: No     Comment: quit drinking back in 1977.  . Drug Use: No  . Sexual Activity: Not Currently   Other Topics Concern  . None   Social History Narrative   Married, wife Bonnita Levan, 7yrs   1 daughter from 1st marriage who lives nearby   Son died 18-Mar-2010 in tractor accident.     ROS: Not obtainable    Objective: Vital signs in last 24 hours: Temp:  [97.4 F (36.3 C)-99 F (37.2 C)] 98.3 F (36.8 C) (09/10 1000) Pulse Rate:  [66-79] 77 (09/10 1000) Resp:  [17-39] 25 (09/10 1000) BP: (83-161)/(51-92) 138/75 mmHg (09/10 1000) SpO2:  [86 %-100 %] 86 % (09/10 1007) FiO2 (%):  [10 %-55 %] 50 % (09/10 1007) Weight:  [78.1 kg (172 lb 2.9 oz)] 78.1 kg (172 lb 2.9 oz) (09/09 2230) Weight change:  Last BM Date: 07/16/15  Intake/Output from previous day: 09/09 0701 - 09/10 0700 In: 53 [I.V.:3; IV Piggyback:50] Out: 603 [Urine:603]  PHYSICAL EXAM He is on BiPAP. He does respond but seems confused. His pupils react. Nose and throat are clear. Neck is supple. His chest shows marked bilateral rhonchi and he  has some disconjugate breathing. His heart is irregular without gallop. His abdomen is soft without masses. He does not have any clubbing. Central nervous system examination shows that he is confused but he moves all 4 extremities  Lab Results: Basic Metabolic Panel:  Recent Labs  07/19/2015 1705 07/18/15 0509  NA 143 144  K 4.8 4.5  CL 109 107  CO2 27 26  GLUCOSE 170* 127*  BUN 45* 49*  CREATININE 1.41* 1.59*  CALCIUM 9.4 9.6   Liver Function Tests:  Recent Labs  07/26/2015 1705 07/18/15 0509  AST 27 24  ALT 12* 10*  ALKPHOS 91 84  BILITOT 0.6 0.5  PROT 7.1 6.5  ALBUMIN 2.8* 2.8*   No results for input(s): LIPASE, AMYLASE in the last 72 hours. No results for input(s): AMMONIA in the last 72 hours. CBC:  Recent Labs  08/02/2015 1705 07/18/15 0509  WBC 12.4* 12.7*  NEUTROABS 10.9* 10.6*  HGB 15.1 13.8  HCT 46.6 43.7  MCV 99.4 98.4  PLT 464*  405*   Cardiac Enzymes:  Recent Labs  07/11/2015 1705 07/12/2015 2233 07/18/15 0509  TROPONINI 0.08* 0.08* 0.04*   BNP: No results for input(s): PROBNP in the last 72 hours. D-Dimer: No results for input(s): DDIMER in the last 72 hours. CBG:  Recent Labs  07/18/15 0800  GLUCAP 111*   Hemoglobin A1C: No results for input(s): HGBA1C in the last 72 hours. Fasting Lipid Panel: No results for input(s): CHOL, HDL, LDLCALC, TRIG, CHOLHDL, LDLDIRECT in the last 72 hours. Thyroid Function Tests: No results for input(s): TSH, T4TOTAL, FREET4, T3FREE, THYROIDAB in the last 72 hours. Anemia Panel: No results for input(s): VITAMINB12, FOLATE, FERRITIN, TIBC, IRON, RETICCTPCT in the last 72 hours. Coagulation:  Recent Labs  07/29/2015 1705 07/18/15 0509  LABPROT 38.6* 39.7*  INR 4.09* 4.23*   Urine Drug Screen: Drugs of Abuse  No results found for: LABOPIA, COCAINSCRNUR, LABBENZ, AMPHETMU, THCU, LABBARB  Alcohol Level: No results for input(s): ETH in the last 72 hours. Urinalysis:  Recent Labs  07/15/2015 1813  COLORURINE YELLOW  LABSPEC >1.030*  PHURINE 5.5  GLUCOSEU NEGATIVE  HGBUR LARGE*  BILIRUBINUR NEGATIVE  KETONESUR NEGATIVE  PROTEINUR 100*  UROBILINOGEN 0.2  NITRITE NEGATIVE  LEUKOCYTESUR NEGATIVE   Misc. Labs:   ABGS:  Recent Labs  08/05/2015 1850  PHART 7.304*  PO2ART 264*  TCO2 23.7  HCO3 26.4*     MICROBIOLOGY: Recent Results (from the past 240 hour(s))  Blood Culture (routine x 2)     Status: None (Preliminary result)   Collection Time: 07/13/2015  6:33 PM  Result Value Ref Range Status   Specimen Description BLOOD RIGHT ARM  Final   Special Requests BOTTLES DRAWN AEROBIC AND ANAEROBIC 6CC  Final   Culture NO GROWTH 4 DAYS  Final   Report Status PENDING  Incomplete  Blood Culture (routine x 2)     Status: None (Preliminary result)   Collection Time: 08/02/2015  6:37 PM  Result Value Ref Range Status   Specimen Description BLOOD RIGHT HAND   Final   Special Requests BOTTLES DRAWN AEROBIC AND ANAEROBIC 6CC  Final   Culture NO GROWTH 4 DAYS  Final   Report Status PENDING  Incomplete  Culture, blood (routine x 2)     Status: None (Preliminary result)   Collection Time: 07/16/2015  5:05 PM  Result Value Ref Range Status   Specimen Description BLOOD RIGHT ARM  Final   Special Requests   Final  BOTTLES DRAWN AEROBIC AND ANAEROBIC AEB 6CC ANA 4CC   Culture NO GROWTH < 24 HOURS  Final   Report Status PENDING  Incomplete  Culture, blood (routine x 2)     Status: None (Preliminary result)   Collection Time: 08/07/2015  5:10 PM  Result Value Ref Range Status   Specimen Description BLOOD RIGHT HAND  Final   Special Requests   Final    BOTTLES DRAWN AEROBIC AND ANAEROBIC AEB 6CC ANA 4CC   Culture NO GROWTH < 24 HOURS  Final   Report Status PENDING  Incomplete    Studies/Results: Ct Head Wo Contrast  08/03/2015   CLINICAL DATA:  Patient was in the hospital for health care acquired pneumonia and was discharged 30 minutes prior to admission. Patient was found by the paramedics to have low oxygen saturation and altered level of consciousness. Acute encephalopathy.  EXAM: CT HEAD WITHOUT CONTRAST  TECHNIQUE: Contiguous axial images were obtained from the base of the skull through the vertex without intravenous contrast.  COMPARISON:  12/19/2013  FINDINGS: Diffuse cerebral atrophy. Ventricular dilatation consistent with central atrophy. Low-attenuation changes throughout the deep white matter consistent with small vessel ischemia. No mass effect or midline shift. No abnormal extra-axial fluid collections. Gray-white matter junctions are distinct. Basal cisterns are not effaced. No evidence of acute intracranial hemorrhage. No depressed skull fractures. Opacification of the right mastoid air cells. Mucosal thickening in the paranasal sinuses. Vascular calcifications. Subcutaneous scalp hematoma over the right frontotemporal region.  IMPRESSION: No  acute intracranial abnormalities. Chronic atrophy and small vessel ischemic changes. Opacification of the right mastoid air cells.   Electronically Signed   By: Lucienne Capers M.D.   On: 08/05/2015 22:17   Dg Chest Port 1 View  07/19/2015   CLINICAL DATA:  Shortness of breath with decreased oxygen saturation  EXAM: PORTABLE CHEST - 1 VIEW  COMPARISON:  July 14, 2015  FINDINGS: There is airspace consolidation in both lung bases, essentially stable. There is a small effusion on the right. Heart is mildly enlarged with pulmonary vascularity within normal limits. Pacemaker leads are attached to the right atrium and right ventricle. No adenopathy. Patient is status post median sternotomy.  IMPRESSION: Bibasilar airspace consolidation. Cardiomegaly with small effusion. All of these findings may be indicative of congestive heart failure. Superimposed pneumonia in the bases, however, cannot be excluded. Both entities may exist concurrently.   Electronically Signed   By: Lowella Grip III M.D.   On: 07/09/2015 17:29    Medications:  Prior to Admission:  Prescriptions prior to admission  Medication Sig Dispense Refill Last Dose  . brimonidine-timolol (COMBIGAN) 0.2-0.5 % ophthalmic solution Place 1 drop into both eyes every 12 (twelve) hours.   Past Week at Unknown time  . docusate sodium (COLACE) 100 MG capsule Take 1 capsule (100 mg total) by mouth every 12 (twelve) hours. 60 capsule 0 Past Week at Unknown time  . donepezil (ARICEPT) 10 MG tablet Take 1 tablet (10 mg total) by mouth every morning. 90 tablet 3 Past Week at Unknown time  . finasteride (PROSCAR) 5 MG tablet Take 5 mg by mouth every morning.    Past Week at Unknown time  . latanoprost (XALATAN) 0.005 % ophthalmic solution Place 1 drop into both eyes at bedtime.    Past Week at Unknown time  . memantine (NAMENDA) 10 MG tablet Take 1 tablet (10 mg total) by mouth 2 (two) times daily. 180 tablet 3 Past Week at Unknown time  .  QUEtiapine  (SEROQUEL) 50 MG tablet TAKE 2 TABLETS BY MOUTH DAILY AS DIRECTED (Patient taking differently: TAKE ONE TABLET BY MOUTH TWICE DAILY) 60 tablet 0 Past Week at Unknown time  . simvastatin (ZOCOR) 40 MG tablet Take 1 tablet (40 mg total) by mouth at bedtime. 90 tablet 3 Past Week at Unknown time  . warfarin (COUMADIN) 5 MG tablet TAKE AS INSTRUCTED BY ANTICOAGULATION CLINIC (Patient taking differently: TAKE AS INSTRUCTED BY ANTICOAGULATION CLINIC: 2.5MG  BY MOUTH ON TUESDAYS, THURSDAYS, AND SATURDAYS, THEN TAKE 5MG  ON ALL OTHER DAYS) 35 tablet 3 Past Week at Unknown time  . acetaminophen (TYLENOL) 500 MG tablet Take 500 mg by mouth every 6 (six) hours as needed for pain.    unknown  . amoxicillin-clavulanate (AUGMENTIN) 875-125 MG per tablet Take 1 tablet by mouth 2 (two) times daily. 14 tablet 0 never  . dextromethorphan-guaiFENesin (MUCINEX DM) 30-600 MG per 12 hr tablet Take 1 tablet by mouth 2 (two) times daily as needed for cough.    unknown   Scheduled: . antiseptic oral rinse  7 mL Mouth Rinse q12n4p  . brimonidine  1 drop Both Eyes Q12H   And  . timolol  1 drop Both Eyes BID  . chlorhexidine  15 mL Mouth Rinse BID  . docusate sodium  100 mg Oral BID  . donepezil  10 mg Oral Daily  . finasteride  5 mg Oral Daily  . furosemide  40 mg Intravenous Daily  . latanoprost  1 drop Both Eyes QHS  . memantine  10 mg Oral BID  . piperacillin-tazobactam (ZOSYN)  IV  3.375 g Intravenous Q8H  . QUEtiapine  50 mg Oral BID  . simvastatin  40 mg Oral QHS  . sodium chloride  3 mL Intravenous Q12H  . [START ON 07/19/2015] vancomycin  1,000 mg Intravenous Q24H  . Warfarin - Pharmacist Dosing Inpatient   Does not apply Q24H   Continuous:  [facility]:[id number] **OR** acetaminophen, dextromethorphan-guaiFENesin, ondansetron **OR** ondansetron (ZOFRAN) IV  Assesment: He has acute hypoxic hyper Respiratory failure. I agree he should be treated for healthcare associated pneumonia and he is on appropriate treatment  for that. There may be an element of heart failure and he very well may have aspirated. Although he has improved he still has significant work of breathing despite BiPAP. He is still critically ill and requires close monitoring. Principal Problem:   Acute respiratory failure with hypoxia and hypercarbia Active Problems:   Obstructive sleep apnea   Atrial fibrillation   Coronary artery disease  s/p CABG EF 55% 2011   Hyperglycemia   HCAP (healthcare-associated pneumonia)   Dementia   CKD (chronic kidney disease) stage 3, GFR 30-59 ml/min   Encephalopathy, metabolic   Acute respiratory failure   CHF (congestive heart failure)    Plan: I would continue BiPAP for now. Continue with IV antibiotics. He's on diuretics. His antibiotics are appropriate and I would go ahead and add steroids.    LOS: 1 day   Ameet Sandy L 07/18/2015, 10:56 AM

## 2015-07-19 ENCOUNTER — Inpatient Hospital Stay (HOSPITAL_COMMUNITY): Payer: Medicare Other

## 2015-07-19 LAB — GLUCOSE, CAPILLARY
GLUCOSE-CAPILLARY: 110 mg/dL — AB (ref 65–99)
GLUCOSE-CAPILLARY: 193 mg/dL — AB (ref 65–99)
GLUCOSE-CAPILLARY: 184 mg/dL — AB (ref 65–99)
GLUCOSE-CAPILLARY: 125 mg/dL — AB (ref 65–99)
Glucose-Capillary: 101 mg/dL — ABNORMAL HIGH (ref 65–99)
Glucose-Capillary: 128 mg/dL — ABNORMAL HIGH (ref 65–99)

## 2015-07-19 LAB — PROTIME-INR
INR: 5.85 (ref 0.00–1.49)
Prothrombin Time: 50.6 seconds — ABNORMAL HIGH (ref 11.6–15.2)

## 2015-07-19 LAB — CULTURE, BLOOD (ROUTINE X 2)
CULTURE: NO GROWTH
Culture: NO GROWTH

## 2015-07-19 LAB — BASIC METABOLIC PANEL
ANION GAP: 8 (ref 5–15)
BUN: 56 mg/dL — ABNORMAL HIGH (ref 6–20)
CO2: 28 mmol/L (ref 22–32)
Calcium: 8.8 mg/dL — ABNORMAL LOW (ref 8.9–10.3)
Chloride: 109 mmol/L (ref 101–111)
Creatinine, Ser: 1.97 mg/dL — ABNORMAL HIGH (ref 0.61–1.24)
GFR calc Af Amer: 35 mL/min — ABNORMAL LOW (ref >60)
GFR calc non Af Amer: 30 mL/min — ABNORMAL LOW (ref >60)
GLUCOSE: 111 mg/dL — AB (ref 65–99)
POTASSIUM: 4 mmol/L (ref 3.5–5.1)
Sodium: 145 mmol/L (ref 135–145)

## 2015-07-19 MED ORDER — LEVALBUTEROL HCL 0.63 MG/3ML IN NEBU
0.6300 mg | INHALATION_SOLUTION | Freq: Four times a day (QID) | RESPIRATORY_TRACT | Status: DC
Start: 2015-07-19 — End: 2015-07-22
  Administered 2015-07-19 – 2015-07-21 (×7): 0.63 mg via RESPIRATORY_TRACT
  Filled 2015-07-19 (×8): qty 3

## 2015-07-19 MED ORDER — SODIUM CHLORIDE 0.9 % IV BOLUS (SEPSIS)
500.0000 mL | Freq: Once | INTRAVENOUS | Status: AC
  Administered 2015-07-19: 500 mL via INTRAVENOUS

## 2015-07-19 MED ORDER — SODIUM CHLORIDE 0.9 % IV BOLUS (SEPSIS)
750.0000 mL | Freq: Once | INTRAVENOUS | Status: AC
  Administered 2015-07-19: 750 mL via INTRAVENOUS

## 2015-07-19 MED ORDER — SODIUM CHLORIDE 0.9 % IV BOLUS (SEPSIS)
750.0000 mL | Freq: Once | INTRAVENOUS | Status: AC
  Administered 2015-07-19: 750 mL via INTRAVENOUS
  Administered 2015-07-19: 03:00:00 via INTRAVENOUS

## 2015-07-19 NOTE — Progress Notes (Signed)
ANTICOAGULATION CONSULT NOTE - Follow Up Consult  Pharmacy Consult for Coumadin (chronic Rx PTA) Indication: atrial fibrillation  No Known Allergies  Patient Measurements: Height: 6' (182.9 cm) Weight: 174 lb 6.1 oz (79.1 kg) IBW/kg (Calculated) : 77.6  Vital Signs: Temp: 97.3 F (36.3 C) (09/11 0800) Temp Source: Axillary (09/11 0001) BP: 111/71 mmHg (09/11 0800) Pulse Rate: 77 (09/11 0800)  Labs:  Recent Labs  07/31/2015 1705 08/05/2015 2233 07/18/15 0509 07/18/15 1040 07/19/15 0531  HGB 15.1  --  13.8  --   --   HCT 46.6  --  43.7  --   --   PLT 464*  --  405*  --   --   LABPROT 38.6*  --  39.7*  --  50.6*  INR 4.09*  --  4.23*  --  5.85*  CREATININE 1.41*  --  1.59*  --  1.97*  TROPONINI 0.08* 0.08* 0.04* 0.04*  --    Estimated Creatinine Clearance: 32.3 mL/min (by C-G formula based on Cr of 1.97).  Medications:  Prescriptions prior to admission  Medication Sig Dispense Refill Last Dose  . brimonidine-timolol (COMBIGAN) 0.2-0.5 % ophthalmic solution Place 1 drop into both eyes every 12 (twelve) hours.   Past Week at Unknown time  . docusate sodium (COLACE) 100 MG capsule Take 1 capsule (100 mg total) by mouth every 12 (twelve) hours. 60 capsule 0 Past Week at Unknown time  . donepezil (ARICEPT) 10 MG tablet Take 1 tablet (10 mg total) by mouth every morning. 90 tablet 3 Past Week at Unknown time  . finasteride (PROSCAR) 5 MG tablet Take 5 mg by mouth every morning.    Past Week at Unknown time  . latanoprost (XALATAN) 0.005 % ophthalmic solution Place 1 drop into both eyes at bedtime.    Past Week at Unknown time  . memantine (NAMENDA) 10 MG tablet Take 1 tablet (10 mg total) by mouth 2 (two) times daily. 180 tablet 3 Past Week at Unknown time  . QUEtiapine (SEROQUEL) 50 MG tablet TAKE 2 TABLETS BY MOUTH DAILY AS DIRECTED (Patient taking differently: TAKE ONE TABLET BY MOUTH TWICE DAILY) 60 tablet 0 Past Week at Unknown time  . simvastatin (ZOCOR) 40 MG tablet Take 1  tablet (40 mg total) by mouth at bedtime. 90 tablet 3 Past Week at Unknown time  . warfarin (COUMADIN) 5 MG tablet TAKE AS INSTRUCTED BY ANTICOAGULATION CLINIC (Patient taking differently: TAKE AS INSTRUCTED BY ANTICOAGULATION CLINIC: 2.5MG  BY MOUTH ON TUESDAYS, THURSDAYS, AND SATURDAYS, THEN TAKE 5MG  ON ALL OTHER DAYS) 35 tablet 3 Past Week at Unknown time  . acetaminophen (TYLENOL) 500 MG tablet Take 500 mg by mouth every 6 (six) hours as needed for pain.    unknown  . amoxicillin-clavulanate (AUGMENTIN) 875-125 MG per tablet Take 1 tablet by mouth 2 (two) times daily. 14 tablet 0 never  . dextromethorphan-guaiFENesin (MUCINEX DM) 30-600 MG per 12 hr tablet Take 1 tablet by mouth 2 (two) times daily as needed for cough.    unknown    Assessment: 81yo male on chronic Coumadin PTA for h/o afib.  INR SUPRAtherapeutic on admission and remains elevated despite no Coumadin given while in hospital.   Records from Coumadin clinic noted.    Anticoagulation Monitoring 07/08/2015  INR goal 2.0-3.0  Assoc. INR Date 07/08/2015  Associated INR 3.3  Pt. deviation No  Current weekly dose 27.5 mg  Sunday dose 5 mg  Monday dose 5 mg  Tuesday dose 2.5 mg  Wednesday  dose 5 mg  Thursday dose 2.5 mg  Friday dose 5 mg  Saturday dose 2.5 mg  Weekly dose 27.5 mg  Dose description Skip today's dose, then continue taking 1 tablet (5mg ) every day except 1/2 tablet (2.5mg ) on Tuesdays Thursdays and Saturdays. Recheck in 3 weeks.  Return date 07/30/2015  VISIT REPORT     Goal of Therapy:  INR 2-3   Plan:  HOLD coumadin again today INR daily  Pricilla [name] 07/19/2015,11:12 AM

## 2015-07-19 NOTE — Progress Notes (Signed)
Dr Arnoldo Morale paged about low blood pressure after first bolus; order received for second fluid bolus of 735ml

## 2015-07-19 NOTE — Progress Notes (Signed)
Subjective: He looks much better today. He is off BiPAP and on nasal oxygen at this point.  Objective: Vital signs in last 24 hours: Temp:  [97 F (36.1 C)-99 F (37.2 C)] 97.3 F (36.3 C) (09/11 0800) Pulse Rate:  [67-81] 77 (09/11 0800) Resp:  [13-27] 20 (09/11 0800) BP: (64-144)/(16-75) 111/71 mmHg (09/11 0800) SpO2:  [83 %-100 %] 95 % (09/11 0800) FiO2 (%):  [50 %-100 %] 75 % (09/11 0400) Weight:  [79.1 kg (174 lb 6.1 oz)] 79.1 kg (174 lb 6.1 oz) (09/11 0500) Weight change: 1 kg (2 lb 3.3 oz) Last BM Date: 07/16/15  Intake/Output from previous day: 09/10 0701 - 09/11 0700 In: 1923 [P.O.:120; I.V.:3; IV Piggyback:1800] Out: 1350 [Urine:1350]  PHYSICAL EXAM General appearance: alert and mild distress Resp: rhonchi bilaterally Cardio: irregularly irregular rhythm GI: soft, non-tender; bowel sounds normal; no masses,  no organomegaly Extremities: extremities normal, atraumatic, no cyanosis or edema  Lab Results:  Results for orders placed or performed during the hospital encounter of 07/10/2015 (from the past 48 hour(s))  Blood gas, arterial ([facility] ONLY)     Status: Abnormal   Collection Time: 07/28/2015  5:00 PM  Result Value Ref Range   FIO2 100.00    Delivery systems OXYGEN MASK    pH, Arterial 7.190 (LL) 7.350 - 7.450    Comment: CRITICAL RESULT CALLED TO, READ BACK BY AND VERIFIED WITH: CASEY VICK RN,AT 1710 BY VANESSA LAWSON RRT,RCP ON 07/22/2015    pCO2 arterial 78.3 (HH) 35.0 - 45.0 mmHg    Comment: CRITICAL RESULT CALLED TO, READ BACK BY AND VERIFIED WITH: CASEY VICK RN,AT 1710 BY VANESSA LAWSON RRT,RCP ON 08/01/2015    pO2, Arterial 75.3 (L) 80.0 - 100.0 mmHg   Bicarbonate 28.7 (H) 20.0 - 24.0 mEq/L   TCO2 26.4 0 - 100 mmol/L   Acid-Base Excess 1.2 0.0 - 2.0 mmol/L   O2 Saturation 89.8 %   Patient temperature 37.0    Collection site BRACHIAL ARTERY    Drawn by COLLECTED BY RT    Sample type ARTERIAL    Allens test (pass/fail) NOT INDICATED (A) PASS  CBC  with Differential/Platelet     Status: Abnormal   Collection Time: 07/10/2015  5:05 PM  Result Value Ref Range   WBC 12.4 (H) 4.0 - 10.5 K/uL   RBC 4.69 4.22 - 5.81 MIL/uL   Hemoglobin 15.1 13.0 - 17.0 g/dL   HCT 46.6 39.0 - 52.0 %   MCV 99.4 78.0 - 100.0 fL   MCH 32.2 26.0 - 34.0 pg   MCHC 32.4 30.0 - 36.0 g/dL   RDW 13.6 11.5 - 15.5 %   Platelets 464 (H) 150 - 400 K/uL   Neutrophils Relative % 87 (H) 43 - 77 %   Neutro Abs 10.9 (H) 1.7 - 7.7 K/uL   Lymphocytes Relative 7 (L) 12 - 46 %   Lymphs Abs 0.8 0.7 - 4.0 K/uL   Monocytes Relative 6 3 - 12 %   Monocytes Absolute 0.7 0.1 - 1.0 K/uL   Eosinophils Relative 0 0 - 5 %   Eosinophils Absolute 0.0 0.0 - 0.7 K/uL   Basophils Relative 0 0 - 1 %   Basophils Absolute 0.0 0.0 - 0.1 K/uL  Comprehensive metabolic panel     Status: Abnormal   Collection Time: 07/13/2015  5:05 PM  Result Value Ref Range   Sodium 143 135 - 145 mmol/L   Potassium 4.8 3.5 - 5.1 mmol/L  Chloride 109 101 - 111 mmol/L   CO2 27 22 - 32 mmol/L   Glucose, Bld 170 (H) 65 - 99 mg/dL   BUN 45 (H) 6 - 20 mg/dL   Creatinine, Ser 1.41 (H) 0.61 - 1.24 mg/dL   Calcium 9.4 8.9 - 10.3 mg/dL   Total Protein 7.1 6.5 - 8.1 g/dL   Albumin 2.8 (L) 3.5 - 5.0 g/dL   AST 27 15 - 41 U/L   ALT 12 (L) 17 - 63 U/L   Alkaline Phosphatase 91 38 - 126 U/L   Total Bilirubin 0.6 0.3 - 1.2 mg/dL   GFR calc non Af Amer 45 (L) >60 mL/min   GFR calc Af Amer 52 (L) >60 mL/min    Comment: (NOTE) The eGFR has been calculated using the CKD EPI equation. This calculation has not been validated in all clinical situations. eGFR's persistently <60 mL/min signify possible Chronic Kidney Disease.    Anion gap 7 5 - 15  Brain natriuretic peptide     Status: Abnormal   Collection Time: 07/27/2015  5:05 PM  Result Value Ref Range   B Natriuretic Peptide 1580.0 (H) 0.0 - 100.0 pg/mL  Culture, blood (routine x 2)     Status: None (Preliminary result)   Collection Time: 07/22/2015  5:05 PM  Result  Value Ref Range   Specimen Description BLOOD RIGHT ARM    Special Requests      BOTTLES DRAWN AEROBIC AND ANAEROBIC AEB 6CC ANA 4CC   Culture NO GROWTH < 24 HOURS    Report Status PENDING   Protime-INR     Status: Abnormal   Collection Time: 07/22/2015  5:05 PM  Result Value Ref Range   Prothrombin Time 38.6 (H) 11.6 - 15.2 seconds   INR 4.09 (H) 0.00 - 1.49  Troponin I     Status: Abnormal   Collection Time: 07/25/2015  5:05 PM  Result Value Ref Range   Troponin I 0.08 (H) <0.031 ng/mL    Comment:        PERSISTENTLY INCREASED TROPONIN VALUES IN THE RANGE OF 0.04-0.49 ng/mL CAN BE SEEN IN:       -UNSTABLE ANGINA       -CONGESTIVE HEART FAILURE       -MYOCARDITIS       -CHEST TRAUMA       -ARRYHTHMIAS       -LATE PRESENTING MYOCARDIAL INFARCTION       -COPD   CLINICAL FOLLOW-UP RECOMMENDED.   Culture, blood (routine x 2)     Status: None (Preliminary result)   Collection Time: 07/13/2015  5:10 PM  Result Value Ref Range   Specimen Description BLOOD RIGHT HAND    Special Requests      BOTTLES DRAWN AEROBIC AND ANAEROBIC AEB 6CC ANA 4CC   Culture NO GROWTH < 24 HOURS    Report Status PENDING   I-Stat CG4 Lactic Acid, ED     Status: Abnormal   Collection Time: 07/16/2015  5:27 PM  Result Value Ref Range   Lactic Acid, Venous 2.43 (HH) 0.5 - 2.0 mmol/L  Urinalysis, Routine w reflex microscopic (not at [facility])     Status: Abnormal   Collection Time: 07/26/2015  6:13 PM  Result Value Ref Range   Color, Urine YELLOW YELLOW   APPearance CLEAR CLEAR   Specific Gravity, Urine >1.030 (H) 1.005 - 1.030   pH 5.5 5.0 - 8.0   Glucose, UA NEGATIVE NEGATIVE mg/dL   Hgb urine dipstick LARGE (  A) NEGATIVE   Bilirubin Urine NEGATIVE NEGATIVE   Ketones, ur NEGATIVE NEGATIVE mg/dL   Protein, ur 100 (A) NEGATIVE mg/dL   Urobilinogen, UA 0.2 0.0 - 1.0 mg/dL   Nitrite NEGATIVE NEGATIVE   Leukocytes, UA NEGATIVE NEGATIVE  Urine culture     Status: None (Preliminary result)   Collection Time:  08/04/2015  6:13 PM  Result Value Ref Range   Specimen Description URINE, CATHETERIZED    Special Requests NONE    Culture      NO GROWTH < 12 HOURS Performed at [facility]    Report Status PENDING   Urine microscopic-add on     Status: Abnormal   Collection Time: 08/05/2015  6:13 PM  Result Value Ref Range   Squamous Epithelial / LPF FEW (A) RARE   WBC, UA 0-2 <3 WBC/hpf   RBC / HPF TOO NUMEROUS TO COUNT <3 RBC/hpf   Bacteria, UA FEW (A) RARE   Casts GRANULAR CAST (A) NEGATIVE  I-Stat Troponin, ED (not at [facility])     Status: Abnormal   Collection Time: 07/20/2015  6:21 PM  Result Value Ref Range   Troponin i, poc 0.09 (HH) 0.00 - 0.08 ng/mL   Comment 3            Comment: Due to the release kinetics of cTnI, a negative result within the first hours of the onset of symptoms does not rule out myocardial infarction with certainty. If myocardial infarction is still suspected, repeat the test at appropriate intervals.   Blood gas, arterial ([facility] ONLY)     Status: Abnormal   Collection Time: 07/22/2015  6:50 PM  Result Value Ref Range   FIO2 100.00    Delivery systems BILEVEL POSITIVE AIRWAY PRESSURE    LHR 12 resp/min   Inspiratory PAP 14    Expiratory PAP 7    pH, Arterial 7.304 (L) 7.350 - 7.450   pCO2 arterial 54.8 (H) 35.0 - 45.0 mmHg   pO2, Arterial 264 (H) 80.0 - 100.0 mmHg   Bicarbonate 26.4 (H) 20.0 - 24.0 mEq/L   TCO2 23.7 0 - 100 mmol/L   Acid-Base Excess 0.8 0.0 - 2.0 mmol/L   O2 Saturation 99.4 %   Patient temperature 37.0    Collection site RIGHT RADIAL    Drawn by [id number]    Sample type ARTERIAL    Allens test (pass/fail) PASS PASS  I-Stat CG4 Lactic Acid, ED     Status: Abnormal   Collection Time: 08/03/2015  8:21 PM  Result Value Ref Range   Lactic Acid, Venous 2.17 (HH) 0.5 - 2.0 mmol/L  Troponin I (q 6hr x 3)     Status: Abnormal   Collection Time: 07/23/2015 10:33 PM  Result Value Ref Range   Troponin I 0.08 (H) <0.031 ng/mL    Comment:         PERSISTENTLY INCREASED TROPONIN VALUES IN THE RANGE OF 0.04-0.49 ng/mL CAN BE SEEN IN:       -UNSTABLE ANGINA       -CONGESTIVE HEART FAILURE       -MYOCARDITIS       -CHEST TRAUMA       -ARRYHTHMIAS       -LATE PRESENTING MYOCARDIAL INFARCTION       -COPD   CLINICAL FOLLOW-UP RECOMMENDED.   Lactic acid, plasma     Status: Abnormal   Collection Time: 08/03/2015 10:33 PM  Result Value Ref Range   Lactic Acid, Venous 2.3 (HH)  0.5 - 2.0 mmol/L    Comment: CRITICAL RESULT CALLED TO, READ BACK BY AND VERIFIED WITH: WAGNOR,R AT 2335 ON 07/31/2015 BY ISLEY,B   Procalcitonin     Status: None   Collection Time: 07/18/2015 10:33 PM  Result Value Ref Range   Procalcitonin 0.19 ng/mL    Comment:        Interpretation: PCT (Procalcitonin) <= 0.5 ng/mL: Systemic infection (sepsis) is not likely. Local bacterial infection is possible. (NOTE)         ICU PCT Algorithm               Non ICU PCT Algorithm    ----------------------------     ------------------------------         PCT < 0.25 ng/mL                 PCT < 0.1 ng/mL     Stopping of antibiotics            Stopping of antibiotics       strongly encouraged.               strongly encouraged.    ----------------------------     ------------------------------       PCT level decrease by               PCT < 0.25 ng/mL       >= 80% from peak PCT       OR PCT 0.25 - 0.5 ng/mL          Stopping of antibiotics                                             encouraged.     Stopping of antibiotics           encouraged.    ----------------------------     ------------------------------       PCT level decrease by              PCT >= 0.25 ng/mL       < 80% from peak PCT        AND PCT >= 0.5 ng/mL            Continuin g antibiotics                                              encouraged.       Continuing antibiotics            encouraged.    ----------------------------     ------------------------------     PCT level increase compared           PCT > 0.5 ng/mL         with peak PCT AND          PCT >= 0.5 ng/mL             Escalation of antibiotics                                          strongly encouraged.      Escalation of antibiotics        strongly encouraged.  Lactic acid, plasma     Status: Abnormal   Collection Time: 07/18/15  1:21 AM  Result Value Ref Range   Lactic Acid, Venous 2.2 (HH) 0.5 - 2.0 mmol/L    Comment: CRITICAL RESULT CALLED TO, READ BACK BY AND VERIFIED WITH: WAGNOR,R AT 0200 ON 07/18/15 BY ISLEY,B   Protime-INR     Status: Abnormal   Collection Time: 07/18/15  5:09 AM  Result Value Ref Range   Prothrombin Time 39.7 (H) 11.6 - 15.2 seconds   INR 4.23 (H) 0.00 - 1.49  Troponin I (q 6hr x 3)     Status: Abnormal   Collection Time: 07/18/15  5:09 AM  Result Value Ref Range   Troponin I 0.04 (H) <0.031 ng/mL    Comment:        PERSISTENTLY INCREASED TROPONIN VALUES IN THE RANGE OF 0.04-0.49 ng/mL CAN BE SEEN IN:       -UNSTABLE ANGINA       -CONGESTIVE HEART FAILURE       -MYOCARDITIS       -CHEST TRAUMA       -ARRYHTHMIAS       -LATE PRESENTING MYOCARDIAL INFARCTION       -COPD   CLINICAL FOLLOW-UP RECOMMENDED.   Comprehensive metabolic panel     Status: Abnormal   Collection Time: 07/18/15  5:09 AM  Result Value Ref Range   Sodium 144 135 - 145 mmol/L   Potassium 4.5 3.5 - 5.1 mmol/L   Chloride 107 101 - 111 mmol/L   CO2 26 22 - 32 mmol/L   Glucose, Bld 127 (H) 65 - 99 mg/dL   BUN 49 (H) 6 - 20 mg/dL   Creatinine, Ser 1.59 (H) 0.61 - 1.24 mg/dL   Calcium 9.6 8.9 - 10.3 mg/dL   Total Protein 6.5 6.5 - 8.1 g/dL   Albumin 2.8 (L) 3.5 - 5.0 g/dL   AST 24 15 - 41 U/L   ALT 10 (L) 17 - 63 U/L   Alkaline Phosphatase 84 38 - 126 U/L   Total Bilirubin 0.5 0.3 - 1.2 mg/dL   GFR calc non Af Amer 39 (L) >60 mL/min   GFR calc Af Amer 45 (L) >60 mL/min    Comment: (NOTE) The eGFR has been calculated using the CKD EPI equation. This calculation has not been validated in all clinical  situations. eGFR's persistently <60 mL/min signify possible Chronic Kidney Disease.    Anion gap 11 5 - 15  CBC WITH DIFFERENTIAL     Status: Abnormal   Collection Time: 07/18/15  5:09 AM  Result Value Ref Range   WBC 12.7 (H) 4.0 - 10.5 K/uL   RBC 4.44 4.22 - 5.81 MIL/uL   Hemoglobin 13.8 13.0 - 17.0 g/dL   HCT 43.7 39.0 - 52.0 %   MCV 98.4 78.0 - 100.0 fL   MCH 31.1 26.0 - 34.0 pg   MCHC 31.6 30.0 - 36.0 g/dL   RDW 13.5 11.5 - 15.5 %   Platelets 405 (H) 150 - 400 K/uL   Neutrophils Relative % 84 (H) 43 - 77 %   Neutro Abs 10.6 (H) 1.7 - 7.7 K/uL   Lymphocytes Relative 7 (L) 12 - 46 %   Lymphs Abs 0.9 0.7 - 4.0 K/uL   Monocytes Relative 9 3 - 12 %   Monocytes Absolute 1.1 (H) 0.1 - 1.0 K/uL   Eosinophils Relative 0 0 - 5 %   Eosinophils Absolute 0.0 0.0 - 0.7 K/uL  Basophils Relative 0 0 - 1 %   Basophils Absolute 0.0 0.0 - 0.1 K/uL  Glucose, capillary     Status: Abnormal   Collection Time: 07/18/15  8:00 AM  Result Value Ref Range   Glucose-Capillary 111 (H) 65 - 99 mg/dL   Comment 1 Notify RN    Comment 2 Document in Chart   Troponin I (q 6hr x 3)     Status: Abnormal   Collection Time: 07/18/15 10:40 AM  Result Value Ref Range   Troponin I 0.04 (H) <0.031 ng/mL    Comment:        PERSISTENTLY INCREASED TROPONIN VALUES IN THE RANGE OF 0.04-0.49 ng/mL CAN BE SEEN IN:       -UNSTABLE ANGINA       -CONGESTIVE HEART FAILURE       -MYOCARDITIS       -CHEST TRAUMA       -ARRYHTHMIAS       -LATE PRESENTING MYOCARDIAL INFARCTION       -COPD   CLINICAL FOLLOW-UP RECOMMENDED.   Glucose, capillary     Status: Abnormal   Collection Time: 07/18/15 11:27 AM  Result Value Ref Range   Glucose-Capillary 143 (H) 65 - 99 mg/dL   Comment 1 Notify RN    Comment 2 Document in Chart   MRSA PCR Screening     Status: None   Collection Time: 07/18/15 12:00 PM  Result Value Ref Range   MRSA by PCR NEGATIVE NEGATIVE    Comment:        The GeneXpert MRSA Assay (FDA approved for  NASAL specimens only), is one component of a comprehensive MRSA colonization surveillance program. It is not intended to diagnose MRSA infection nor to guide or monitor treatment for MRSA infections.   Glucose, capillary     Status: Abnormal   Collection Time: 07/18/15  4:36 PM  Result Value Ref Range   Glucose-Capillary 121 (H) 65 - 99 mg/dL   Comment 1 Notify RN    Comment 2 Document in Chart   Glucose, capillary     Status: Abnormal   Collection Time: 07/18/15  7:54 PM  Result Value Ref Range   Glucose-Capillary 123 (H) 65 - 99 mg/dL  Glucose, capillary     Status: Abnormal   Collection Time: 07/19/15 12:25 AM  Result Value Ref Range   Glucose-Capillary 128 (H) 65 - 99 mg/dL  Glucose, capillary     Status: Abnormal   Collection Time: 07/19/15  5:26 AM  Result Value Ref Range   Glucose-Capillary 101 (H) 65 - 99 mg/dL  Basic metabolic panel     Status: Abnormal   Collection Time: 07/19/15  5:31 AM  Result Value Ref Range   Sodium 145 135 - 145 mmol/L   Potassium 4.0 3.5 - 5.1 mmol/L   Chloride 109 101 - 111 mmol/L   CO2 28 22 - 32 mmol/L   Glucose, Bld 111 (H) 65 - 99 mg/dL   BUN 56 (H) 6 - 20 mg/dL   Creatinine, Ser 1.97 (H) 0.61 - 1.24 mg/dL   Calcium 8.8 (L) 8.9 - 10.3 mg/dL   GFR calc non Af Amer 30 (L) >60 mL/min   GFR calc Af Amer 35 (L) >60 mL/min    Comment: (NOTE) The eGFR has been calculated using the CKD EPI equation. This calculation has not been validated in all clinical situations. eGFR's persistently <60 mL/min signify possible Chronic Kidney Disease.    Anion gap 8 5 -  15  Glucose, capillary     Status: Abnormal   Collection Time: 07/19/15  7:34 AM  Result Value Ref Range   Glucose-Capillary 110 (H) 65 - 99 mg/dL   Comment 1 Notify RN    Comment 2 Document in Chart     ABGS  Recent Labs  08/02/2015 1850  PHART 7.304*  PO2ART 264*  TCO2 23.7  HCO3 26.4*   CULTURES Recent Results (from the past 240 hour(s))  Blood Culture (routine x 2)      Status: None (Preliminary result)   Collection Time: 07/13/2015  6:33 PM  Result Value Ref Range Status   Specimen Description BLOOD RIGHT ARM  Final   Special Requests BOTTLES DRAWN AEROBIC AND ANAEROBIC 6CC  Final   Culture NO GROWTH 4 DAYS  Final   Report Status PENDING  Incomplete  Blood Culture (routine x 2)     Status: None (Preliminary result)   Collection Time: 07/28/2015  6:37 PM  Result Value Ref Range Status   Specimen Description BLOOD RIGHT HAND  Final   Special Requests BOTTLES DRAWN AEROBIC AND ANAEROBIC 6CC  Final   Culture NO GROWTH 4 DAYS  Final   Report Status PENDING  Incomplete  Culture, blood (routine x 2)     Status: None (Preliminary result)   Collection Time: 07/29/2015  5:05 PM  Result Value Ref Range Status   Specimen Description BLOOD RIGHT ARM  Final   Special Requests   Final    BOTTLES DRAWN AEROBIC AND ANAEROBIC AEB 6CC ANA 4CC   Culture NO GROWTH < 24 HOURS  Final   Report Status PENDING  Incomplete  Culture, blood (routine x 2)     Status: None (Preliminary result)   Collection Time: 07/31/2015  5:10 PM  Result Value Ref Range Status   Specimen Description BLOOD RIGHT HAND  Final   Special Requests   Final    BOTTLES DRAWN AEROBIC AND ANAEROBIC AEB 6CC ANA 4CC   Culture NO GROWTH < 24 HOURS  Final   Report Status PENDING  Incomplete  Urine culture     Status: None (Preliminary result)   Collection Time: 08/03/2015  6:13 PM  Result Value Ref Range Status   Specimen Description URINE, CATHETERIZED  Final   Special Requests NONE  Final   Culture   Final    NO GROWTH < 12 HOURS Performed at [facility]    Report Status PENDING  Incomplete  MRSA PCR Screening     Status: None   Collection Time: 07/18/15 12:00 PM  Result Value Ref Range Status   MRSA by PCR NEGATIVE NEGATIVE Final    Comment:        The GeneXpert MRSA Assay (FDA approved for NASAL specimens only), is one component of a comprehensive MRSA colonization surveillance program.  It is not intended to diagnose MRSA infection nor to guide or monitor treatment for MRSA infections.    Studies/Results: Ct Head Wo Contrast  07/09/2015   CLINICAL DATA:  Patient was in the hospital for health care acquired pneumonia and was discharged 30 minutes prior to admission. Patient was found by the paramedics to have low oxygen saturation and altered level of consciousness. Acute encephalopathy.  EXAM: CT HEAD WITHOUT CONTRAST  TECHNIQUE: Contiguous axial images were obtained from the base of the skull through the vertex without intravenous contrast.  COMPARISON:  12/19/2013  FINDINGS: Diffuse cerebral atrophy. Ventricular dilatation consistent with central atrophy. Low-attenuation changes throughout the deep white  matter consistent with small vessel ischemia. No mass effect or midline shift. No abnormal extra-axial fluid collections. Gray-white matter junctions are distinct. Basal cisterns are not effaced. No evidence of acute intracranial hemorrhage. No depressed skull fractures. Opacification of the right mastoid air cells. Mucosal thickening in the paranasal sinuses. Vascular calcifications. Subcutaneous scalp hematoma over the right frontotemporal region.  IMPRESSION: No acute intracranial abnormalities. Chronic atrophy and small vessel ischemic changes. Opacification of the right mastoid air cells.   Electronically Signed   By: Lucienne Capers M.D.   On: 08/07/2015 22:17   Dg Chest Port 1 View  07/13/2015   CLINICAL DATA:  Shortness of breath with decreased oxygen saturation  EXAM: PORTABLE CHEST - 1 VIEW  COMPARISON:  July 14, 2015  FINDINGS: There is airspace consolidation in both lung bases, essentially stable. There is a small effusion on the right. Heart is mildly enlarged with pulmonary vascularity within normal limits. Pacemaker leads are attached to the right atrium and right ventricle. No adenopathy. Patient is status post median sternotomy.  IMPRESSION: Bibasilar airspace  consolidation. Cardiomegaly with small effusion. All of these findings may be indicative of congestive heart failure. Superimposed pneumonia in the bases, however, cannot be excluded. Both entities may exist concurrently.   Electronically Signed   By: Lowella Grip III M.D.   On: 08/04/2015 17:29    Medications:  Prior to Admission:  Prescriptions prior to admission  Medication Sig Dispense Refill Last Dose  . brimonidine-timolol (COMBIGAN) 0.2-0.5 % ophthalmic solution Place 1 drop into both eyes every 12 (twelve) hours.   Past Week at Unknown time  . docusate sodium (COLACE) 100 MG capsule Take 1 capsule (100 mg total) by mouth every 12 (twelve) hours. 60 capsule 0 Past Week at Unknown time  . donepezil (ARICEPT) 10 MG tablet Take 1 tablet (10 mg total) by mouth every morning. 90 tablet 3 Past Week at Unknown time  . finasteride (PROSCAR) 5 MG tablet Take 5 mg by mouth every morning.    Past Week at Unknown time  . latanoprost (XALATAN) 0.005 % ophthalmic solution Place 1 drop into both eyes at bedtime.    Past Week at Unknown time  . memantine (NAMENDA) 10 MG tablet Take 1 tablet (10 mg total) by mouth 2 (two) times daily. 180 tablet 3 Past Week at Unknown time  . QUEtiapine (SEROQUEL) 50 MG tablet TAKE 2 TABLETS BY MOUTH DAILY AS DIRECTED (Patient taking differently: TAKE ONE TABLET BY MOUTH TWICE DAILY) 60 tablet 0 Past Week at Unknown time  . simvastatin (ZOCOR) 40 MG tablet Take 1 tablet (40 mg total) by mouth at bedtime. 90 tablet 3 Past Week at Unknown time  . warfarin (COUMADIN) 5 MG tablet TAKE AS INSTRUCTED BY ANTICOAGULATION CLINIC (Patient taking differently: TAKE AS INSTRUCTED BY ANTICOAGULATION CLINIC: 2.5MG BY MOUTH ON TUESDAYS, THURSDAYS, AND SATURDAYS, THEN TAKE 5MG ON ALL OTHER DAYS) 35 tablet 3 Past Week at Unknown time  . acetaminophen (TYLENOL) 500 MG tablet Take 500 mg by mouth every 6 (six) hours as needed for pain.    unknown  . amoxicillin-clavulanate (AUGMENTIN)  875-125 MG per tablet Take 1 tablet by mouth 2 (two) times daily. 14 tablet 0 never  . dextromethorphan-guaiFENesin (MUCINEX DM) 30-600 MG per 12 hr tablet Take 1 tablet by mouth 2 (two) times daily as needed for cough.    unknown   Scheduled: . antiseptic oral rinse  7 mL Mouth Rinse q12n4p  . brimonidine  1 drop Both Eyes  Q12H   And  . timolol  1 drop Both Eyes BID  . chlorhexidine  15 mL Mouth Rinse BID  . docusate sodium  100 mg Oral BID  . donepezil  10 mg Oral Daily  . finasteride  5 mg Oral Daily  . insulin aspart  0-9 Units Subcutaneous 6 times per day  . latanoprost  1 drop Both Eyes QHS  . levalbuterol  0.63 mg Nebulization Q6H WA  . memantine  10 mg Oral BID  . methylPREDNISolone (SOLU-MEDROL) injection  40 mg Intravenous Q6H  . piperacillin-tazobactam (ZOSYN)  IV  3.375 g Intravenous Q8H  . QUEtiapine  50 mg Oral BID  . simvastatin  40 mg Oral QHS  . sodium chloride  3 mL Intravenous Q12H  . vancomycin  1,000 mg Intravenous Q24H  . Warfarin - Pharmacist Dosing Inpatient   Does not apply Q24H   Continuous:  OFP:[id number] **OR** acetaminophen, dextromethorphan-guaiFENesin, ondansetron **OR** ondansetron (ZOFRAN) IV  Assesment: He was admitted with acute hypoxic and hypercarbic respiratory failure and it appears that he may have aspirated. He is being treated for healthcare associated pneumonia and his antibiotics are appropriate.  In addition to that he has acute diastolic heart failure which is getting better. He has positive troponins which are felt to be due to demand ischemia  At baseline he has chronic atrial fibrillation which is stable  At baseline he has obstructive sleep apnea  He had metabolic encephalopathy which I think is better but does have baseline dementia Principal Problem:   Acute respiratory failure with hypoxia and hypercarbia Active Problems:   Obstructive sleep apnea   Atrial fibrillation   Coronary artery disease  s/p CABG EF 55% 2011    Hyperglycemia   HCAP (healthcare-associated pneumonia)   Dementia   CKD (chronic kidney disease) stage 3, GFR 30-59 ml/min   Encephalopathy, metabolic   Acute respiratory failure   Acute diastolic CHF (congestive heart failure)    Plan: Continue current treatments. He remains  critically ill with multi-system failure although he has improved    LOS: 2 days   Anairis Knick L 07/19/2015, 9:46 AM

## 2015-07-19 NOTE — Progress Notes (Signed)
PT has done well placed on BiPAP for sleep.

## 2015-07-19 NOTE — Progress Notes (Signed)
DR MEMON CALLED LABS" INR 5.85 AND PROTIME 50.6. WE WILL MONITOR PT FOR ANY SX OF BLEEDING.

## 2015-07-19 NOTE — Progress Notes (Signed)
Patient placed on 2.5lpm/[place] . He has been ordered Xopenex 0.63mg  for wheezes and rhonchi. BiPAP still in room as patient does have history of sleep apnea.

## 2015-07-19 NOTE — Plan of Care (Signed)
Problem: Phase I Progression Outcomes Goal: O2 sats > or equal 90% or at baseline Outcome: Progressing O2 SATS MAINTAINING @94 % ON O2 AT  3L/MIN VIA [place] Goal: Dyspnea controlled at rest Outcome: Progressing PT IS LESS SOB W/ EXCERTTION  TODAY THAN YESTER DAY 07/18/15 Goal: Progress activity as tolerated unless otherwise ordered Outcome: Progressing PT ASSISTING NOW W/ REPOSITIONING. Goal: Tolerating diet Outcome: Not Progressing PT IS REFUSING TO EAT, BUT WILL DRINK ORANGE JUICE,WATER,  BREEZE SUPPLEMENT AND ORANGE SHERBERT.

## 2015-07-19 NOTE — Progress Notes (Signed)
PT IS MUCH IMPROVED THIS AM. AWAKE AND ALERT. O2 VIA [place] @ 3L/MIN W/ O2 SAT 96%. Marland Kitchen N

## 2015-07-19 NOTE — Progress Notes (Signed)
[facility] PROGRESS NOTE  Ricky [name] [id number]:[id number] DOB: 07-19-1931 DOA: 07/16/2015 PCP: Olga Millers, MD  Assessment/Plan: 1. Acute respiratory failure with hypoxia and hypercapnia. Patient was recently discharged from the hospital after being treated for pneumonia. He had to return to the hospital the same day with worsening respiratory distress. ABG showed respiratory acidosis and hypoxia. He was started on BiPAP with improvement of ABG parameters. Clinically he is improving and has been weaned off bipap. Appreciate pulmonology assistance. Continue current treatments. 2. Healthcare associated pneumonia. Patient started on vancomycin and Zosyn. We'll de-escalate antibiotic as his clinical condition improves. ? if he had an episode of aspiration with sudden decline in respiratory status. Will check swallow evaluation. 3. Acute diastolic CHF. Patient has elevated BNP. He was started on IV lasix, but became hypotensive and has rising creatinine. Clinically, his volume status appears to be better. Will have to monitor fluid status closely. Hold off on further lasix for now. Continue to monitor intake and output. 4. Acute encephalopathy. Likely related to hypercapnia. Appears to be improving with BiPAP therapy. Continue current treatments. 5. Dementia. Patient has baseline dementia. Will continue Aricept and Namenda. 6. Chronic atrial fibrillation. Status post pacemaker. He is anticoagulated with Coumadin. 7. CKD stage III. Creatinine appears to be her baseline. Creatinine is trending up. Will hold off on further lasix. Continue to monitor. 8. Demand ischemia. Mild elevation of troponins in the setting of acute illness. Echocardiogram is reassuring. 9. 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 Patient completed radiation therapy. Wife reports that he was not a candidate for chemotherapy.  Code Status: DNR Family Communication: discussed with wife at the  bedside Disposition Plan: pending hospital course   Consultants:  Pulmonology  Procedures:  Echo: - Left ventricle: The cavity size was normal. Systolic function was normal. The estimated ejection fraction was in the range of 50% to 55%. Wall motion was normal; there were no regional wall motion abnormalities. - Aortic valve: There was mild regurgitation. - Mitral valve: There was mild regurgitation. - Tricuspid valve: There was moderate regurgitation.  Antibiotics:  Vancomycin 9/9>>  Zosyn 9/9>>  HPI/Subjective: Patient taken off bipap this morning. He has a cough. He is feeling better today. Breathing improving.  Objective: Filed Vitals:   07/19/15 0800  BP: 111/71  Pulse: 77  Temp: 97.3 F (36.3 C)  Resp: 20    Intake/Output Summary (Last 24 hours) at 07/19/15 0904 Last data filed at 07/19/15 0800  Gross per 24 hour  Intake   2523 ml  Output   1350 ml  Net   1173 ml   Filed Weights   08/06/2015 2230 07/19/15 0500  Weight: 78.1 kg (172 lb 2.9 oz) 79.1 kg (174 lb 6.1 oz)    Exam:   General:  Sitting up in bed, no distress, on nasal cannula  Cardiovascular: s1, s2, regular rate  Respiratory: coarse breath sounds mostly in upper airways  Abdomen: soft, nt, nd, bs+  Musculoskeletal: no edema b/l   Data Reviewed: Basic Metabolic Panel:  Recent Labs Lab 07/15/15 0615 07/16/15 0602 08/03/2015 1705 07/18/15 0509 07/19/15 0531  NA 140 141 143 144 145  K 4.4 4.6 4.8 4.5 4.0  CL 103 105 109 107 109  CO2 30 27 27 26 28   GLUCOSE 109* 112* 170* 127* 111*  BUN 36* 38* 45* 49* 56*  CREATININE 1.66* 1.42* 1.41* 1.59* 1.97*  CALCIUM 9.1 9.3 9.4 9.6 8.8*   Liver Function Tests:  Recent Labs  Lab 07/29/2015 1705 07/18/15 0509  AST 27 24  ALT 12* 10*  ALKPHOS 91 84  BILITOT 0.6 0.5  PROT 7.1 6.5  ALBUMIN 2.8* 2.8*   No results for input(s): LIPASE, AMYLASE in the last 168 hours. No results for input(s): AMMONIA in the last 168  hours. CBC:  Recent Labs Lab 07/15/2015 1715 07/15/15 0615 07/16/15 0602 07/13/2015 1705 07/18/15 0509  WBC 9.3 7.8 9.7 12.4* 12.7*  NEUTROABS  --   --   --  10.9* 10.6*  HGB 13.9 13.0 14.2 15.1 13.8  HCT 41.4 39.4 43.9 46.6 43.7  MCV 95.2 95.9 96.1 99.4 98.4  PLT 267 261 335 464* 405*   Cardiac Enzymes:  Recent Labs Lab 07/15/15 1058 07/25/2015 1705 07/18/2015 2233 07/18/15 0509 07/18/15 1040  TROPONINI <0.03 0.08* 0.08* 0.04* 0.04*   BNP (last 3 results)  Recent Labs  07/18/2015 1715 07/19/2015 1705  BNP 133.0* 1580.0*    ProBNP (last 3 results) No results for input(s): PROBNP in the last 8760 hours.  CBG:  Recent Labs Lab 07/18/15 1636 07/18/15 1954 07/19/15 0025 07/19/15 0526 07/19/15 0734  GLUCAP 121* 123* 128* 101* 110*    Recent Results (from the past 240 hour(s))  Blood Culture (routine x 2)     Status: None (Preliminary result)   Collection Time: 08/02/2015  6:33 PM  Result Value Ref Range Status   Specimen Description BLOOD RIGHT ARM  Final   Special Requests BOTTLES DRAWN AEROBIC AND ANAEROBIC 6CC  Final   Culture NO GROWTH 4 DAYS  Final   Report Status PENDING  Incomplete  Blood Culture (routine x 2)     Status: None (Preliminary result)   Collection Time: 07/12/2015  6:37 PM  Result Value Ref Range Status   Specimen Description BLOOD RIGHT HAND  Final   Special Requests BOTTLES DRAWN AEROBIC AND ANAEROBIC 6CC  Final   Culture NO GROWTH 4 DAYS  Final   Report Status PENDING  Incomplete  Culture, blood (routine x 2)     Status: None (Preliminary result)   Collection Time: 07/31/2015  5:05 PM  Result Value Ref Range Status   Specimen Description BLOOD RIGHT ARM  Final   Special Requests   Final    BOTTLES DRAWN AEROBIC AND ANAEROBIC AEB 6CC ANA 4CC   Culture NO GROWTH < 24 HOURS  Final   Report Status PENDING  Incomplete  Culture, blood (routine x 2)     Status: None (Preliminary result)   Collection Time: 08/04/2015  5:10 PM  Result Value Ref Range  Status   Specimen Description BLOOD RIGHT HAND  Final   Special Requests   Final    BOTTLES DRAWN AEROBIC AND ANAEROBIC AEB 6CC ANA 4CC   Culture NO GROWTH < 24 HOURS  Final   Report Status PENDING  Incomplete  Urine culture     Status: None (Preliminary result)   Collection Time: 07/14/2015  6:13 PM  Result Value Ref Range Status   Specimen Description URINE, CATHETERIZED  Final   Special Requests NONE  Final   Culture   Final    NO GROWTH < 12 HOURS Performed at [facility]    Report Status PENDING  Incomplete  MRSA PCR Screening     Status: None   Collection Time: 07/18/15 12:00 PM  Result Value Ref Range Status   MRSA by PCR NEGATIVE NEGATIVE Final    Comment:        The GeneXpert MRSA Assay (FDA  approved for NASAL specimens only), is one component of a comprehensive MRSA colonization surveillance program. It is not intended to diagnose MRSA infection nor to guide or monitor treatment for MRSA infections.      Studies: Ct Head Wo Contrast  07/14/2015   CLINICAL DATA:  Patient was in the hospital for health care acquired pneumonia and was discharged 30 minutes prior to admission. Patient was found by the paramedics to have low oxygen saturation and altered level of consciousness. Acute encephalopathy.  EXAM: CT HEAD WITHOUT CONTRAST  TECHNIQUE: Contiguous axial images were obtained from the base of the skull through the vertex without intravenous contrast.  COMPARISON:  12/19/2013  FINDINGS: Diffuse cerebral atrophy. Ventricular dilatation consistent with central atrophy. Low-attenuation changes throughout the deep white matter consistent with small vessel ischemia. No mass effect or midline shift. No abnormal extra-axial fluid collections. Gray-white matter junctions are distinct. Basal cisterns are not effaced. No evidence of acute intracranial hemorrhage. No depressed skull fractures. Opacification of the right mastoid air cells. Mucosal thickening in the paranasal  sinuses. Vascular calcifications. Subcutaneous scalp hematoma over the right frontotemporal region.  IMPRESSION: No acute intracranial abnormalities. Chronic atrophy and small vessel ischemic changes. Opacification of the right mastoid air cells.   Electronically Signed   By: Lucienne Capers M.D.   On: 08/07/2015 22:17   Dg Chest Port 1 View  07/19/2015   CLINICAL DATA:  Shortness of breath with decreased oxygen saturation  EXAM: PORTABLE CHEST - 1 VIEW  COMPARISON:  July 14, 2015  FINDINGS: There is airspace consolidation in both lung bases, essentially stable. There is a small effusion on the right. Heart is mildly enlarged with pulmonary vascularity within normal limits. Pacemaker leads are attached to the right atrium and right ventricle. No adenopathy. Patient is status post median sternotomy.  IMPRESSION: Bibasilar airspace consolidation. Cardiomegaly with small effusion. All of these findings may be indicative of congestive heart failure. Superimposed pneumonia in the bases, however, cannot be excluded. Both entities may exist concurrently.   Electronically Signed   By: Lowella Grip III M.D.   On: 07/18/2015 17:29    Scheduled Meds: . antiseptic oral rinse  7 mL Mouth Rinse q12n4p  . brimonidine  1 drop Both Eyes Q12H   And  . timolol  1 drop Both Eyes BID  . chlorhexidine  15 mL Mouth Rinse BID  . docusate sodium  100 mg Oral BID  . donepezil  10 mg Oral Daily  . finasteride  5 mg Oral Daily  . insulin aspart  0-9 Units Subcutaneous 6 times per day  . latanoprost  1 drop Both Eyes QHS  . levalbuterol  0.63 mg Nebulization Q6H WA  . memantine  10 mg Oral BID  . methylPREDNISolone (SOLU-MEDROL) injection  40 mg Intravenous Q6H  . piperacillin-tazobactam (ZOSYN)  IV  3.375 g Intravenous Q8H  . QUEtiapine  50 mg Oral BID  . simvastatin  40 mg Oral QHS  . sodium chloride  3 mL Intravenous Q12H  . vancomycin  1,000 mg Intravenous Q24H  . Warfarin - Pharmacist Dosing Inpatient    Does not apply Q24H   Continuous Infusions:   Principal Problem:   Acute respiratory failure with hypoxia and hypercarbia Active Problems:   Obstructive sleep apnea   Atrial fibrillation   Coronary artery disease  s/p CABG EF 55% 2011   Hyperglycemia   HCAP (healthcare-associated pneumonia)   Dementia   CKD (chronic kidney disease) stage 3, GFR 30-59 ml/min  Encephalopathy, metabolic   Acute respiratory failure   CHF (congestive heart failure)    Time spent: 36mins    Gertrude Tarbet  [facility] Pager [phone]. If 7PM-7AM, please contact night-coverage at [email or web address], password [facility] 07/19/2015, 9:04 AM  LOS: 2 days

## 2015-07-19 NOTE — Progress Notes (Signed)
Pt continues to refuse po food and liquids. Will only drink w/ repeated encouragement

## 2015-07-19 NOTE — Progress Notes (Signed)
Dr Arnoldo Morale called about low Bp; orders received for NS bolus of 739mls

## 2015-07-20 ENCOUNTER — Encounter (HOSPITAL_COMMUNITY): Payer: Self-pay | Admitting: Primary Care

## 2015-07-20 ENCOUNTER — Inpatient Hospital Stay (HOSPITAL_COMMUNITY): Payer: Medicare Other

## 2015-07-20 DIAGNOSIS — Z7189 Other specified counseling: Secondary | ICD-10-CM | POA: Insufficient documentation

## 2015-07-20 DIAGNOSIS — J9601 Acute respiratory failure with hypoxia: Secondary | ICD-10-CM

## 2015-07-20 DIAGNOSIS — G9340 Encephalopathy, unspecified: Secondary | ICD-10-CM

## 2015-07-20 DIAGNOSIS — Z515 Encounter for palliative care: Secondary | ICD-10-CM | POA: Insufficient documentation

## 2015-07-20 DIAGNOSIS — I482 Chronic atrial fibrillation: Secondary | ICD-10-CM

## 2015-07-20 LAB — CBC
HCT: 39 % (ref 39.0–52.0)
Hemoglobin: 12.9 g/dL — ABNORMAL LOW (ref 13.0–17.0)
MCH: 31.9 pg (ref 26.0–34.0)
MCHC: 33.1 g/dL (ref 30.0–36.0)
MCV: 96.5 fL (ref 78.0–100.0)
PLATELETS: 314 10*3/uL (ref 150–400)
RBC: 4.04 MIL/uL — AB (ref 4.22–5.81)
RDW: 13.4 % (ref 11.5–15.5)
WBC: 9.7 10*3/uL (ref 4.0–10.5)

## 2015-07-20 LAB — URINE CULTURE: Culture: NO GROWTH

## 2015-07-20 LAB — BASIC METABOLIC PANEL
Anion gap: 7 (ref 5–15)
BUN: 62 mg/dL — AB (ref 6–20)
CO2: 28 mmol/L (ref 22–32)
CREATININE: 1.63 mg/dL — AB (ref 0.61–1.24)
Calcium: 8.6 mg/dL — ABNORMAL LOW (ref 8.9–10.3)
Chloride: 109 mmol/L (ref 101–111)
GFR, EST AFRICAN AMERICAN: 44 mL/min — AB (ref >60)
GFR, EST NON AFRICAN AMERICAN: 38 mL/min — AB (ref >60)
Glucose, Bld: 133 mg/dL — ABNORMAL HIGH (ref 65–99)
Potassium: 4 mmol/L (ref 3.5–5.1)
SODIUM: 144 mmol/L (ref 135–145)

## 2015-07-20 LAB — PROTIME-INR
INR: 6.67 (ref 0.00–1.49)
Prothrombin Time: 55.8 seconds — ABNORMAL HIGH (ref 11.6–15.2)

## 2015-07-20 LAB — GLUCOSE, CAPILLARY
GLUCOSE-CAPILLARY: 134 mg/dL — AB (ref 65–99)
GLUCOSE-CAPILLARY: 107 mg/dL — AB (ref 65–99)
Glucose-Capillary: 150 mg/dL — ABNORMAL HIGH (ref 65–99)
Glucose-Capillary: 141 mg/dL — ABNORMAL HIGH (ref 65–99)
Glucose-Capillary: 132 mg/dL — ABNORMAL HIGH (ref 65–99)

## 2015-07-20 MED ORDER — VITAMIN K1 1 MG/0.5ML IJ SOLN
INTRAMUSCULAR | Status: AC
  Filled 2015-07-20: qty 0.5

## 2015-07-20 MED ORDER — VITAMIN K1 10 MG/ML IJ SOLN
1.0000 mg | Freq: Once | INTRAVENOUS | Status: AC
  Administered 2015-07-20: 1 mg via INTRAVENOUS
  Filled 2015-07-20: qty 0.1

## 2015-07-20 NOTE — Progress Notes (Signed)
Subjective: He says he feels better. He has no complaints. He is on treatment for healthcare associated pneumonia. He had hypoxic and hypercapnic respiratory failure but has been able to come off BiPAP  Objective: Vital signs in last 24 hours: Temp:  [96.6 F (35.9 C)-98.3 F (36.8 C)] 97.6 F (36.4 C) (09/12 0700) Pulse Rate:  [69-78] 78 (09/12 0700) Resp:  [16-32] 30 (09/12 0700) BP: (88-161)/(48-89) 139/69 mmHg (09/12 0700) SpO2:  [89 %-98 %] 96 % (09/12 0813) FiO2 (%):  [40 %] 40 % (09/12 0311) Weight:  [79.8 kg (175 lb 14.8 oz)] 79.8 kg (175 lb 14.8 oz) (09/12 0500) Weight change: 0.7 kg (1 lb 8.7 oz) Last BM Date: 07/16/15  Intake/Output from previous day: 09/11 0701 - 09/12 0700 In: 2793 [P.O.:1500; I.V.:443; IV Piggyback:850] Out: 1125 [Urine:1125]  PHYSICAL EXAM General appearance: alert and mild distress Resp: rhonchi bilaterally Cardio: irregularly irregular rhythm GI: soft, non-tender; bowel sounds normal; no masses,  no organomegaly Extremities: extremities normal, atraumatic, no cyanosis or edema  Lab Results:  Results for orders placed or performed during the hospital encounter of 07/19/2015 (from the past 48 hour(s))  Troponin I (q 6hr x 3)     Status: Abnormal   Collection Time: 07/18/15 10:40 AM  Result Value Ref Range   Troponin I 0.04 (H) <0.031 ng/mL    Comment:        PERSISTENTLY INCREASED TROPONIN VALUES IN THE RANGE OF 0.04-0.49 ng/mL CAN BE SEEN IN:       -UNSTABLE ANGINA       -CONGESTIVE HEART FAILURE       -MYOCARDITIS       -CHEST TRAUMA       -ARRYHTHMIAS       -LATE PRESENTING MYOCARDIAL INFARCTION       -COPD   CLINICAL FOLLOW-UP RECOMMENDED.   Glucose, capillary     Status: Abnormal   Collection Time: 07/18/15 11:27 AM  Result Value Ref Range   Glucose-Capillary 143 (H) 65 - 99 mg/dL   Comment 1 Notify RN    Comment 2 Document in Chart   MRSA PCR Screening     Status: None   Collection Time: 07/18/15 12:00 PM  Result Value Ref  Range   MRSA by PCR NEGATIVE NEGATIVE    Comment:        The GeneXpert MRSA Assay (FDA approved for NASAL specimens only), is one component of a comprehensive MRSA colonization surveillance program. It is not intended to diagnose MRSA infection nor to guide or monitor treatment for MRSA infections.   Glucose, capillary     Status: Abnormal   Collection Time: 07/18/15  4:36 PM  Result Value Ref Range   Glucose-Capillary 121 (H) 65 - 99 mg/dL   Comment 1 Notify RN    Comment 2 Document in Chart   Glucose, capillary     Status: Abnormal   Collection Time: 07/18/15  7:54 PM  Result Value Ref Range   Glucose-Capillary 123 (H) 65 - 99 mg/dL  Glucose, capillary     Status: Abnormal   Collection Time: 07/19/15 12:25 AM  Result Value Ref Range   Glucose-Capillary 128 (H) 65 - 99 mg/dL  Glucose, capillary     Status: Abnormal   Collection Time: 07/19/15  5:26 AM  Result Value Ref Range   Glucose-Capillary 101 (H) 65 - 99 mg/dL  Protime-INR     Status: Abnormal   Collection Time: 07/19/15  5:31 AM  Result Value Ref Range  Prothrombin Time 50.6 (H) 11.6 - 15.2 seconds   INR 5.85 (HH) 0.00 - 1.49    Comment: CRITICAL RESULT CALLED TO, READ BACK BY AND VERIFIED WITH: PHILLIPS, C AT 1106 BY WOODS, M   Basic metabolic panel     Status: Abnormal   Collection Time: 07/19/15  5:31 AM  Result Value Ref Range   Sodium 145 135 - 145 mmol/L   Potassium 4.0 3.5 - 5.1 mmol/L   Chloride 109 101 - 111 mmol/L   CO2 28 22 - 32 mmol/L   Glucose, Bld 111 (H) 65 - 99 mg/dL   BUN 56 (H) 6 - 20 mg/dL   Creatinine, Ser 1.97 (H) 0.61 - 1.24 mg/dL   Calcium 8.8 (L) 8.9 - 10.3 mg/dL   GFR calc non Af Amer 30 (L) >60 mL/min   GFR calc Af Amer 35 (L) >60 mL/min    Comment: (NOTE) The eGFR has been calculated using the CKD EPI equation. This calculation has not been validated in all clinical situations. eGFR's persistently <60 mL/min signify possible Chronic Kidney Disease.    Anion gap 8 5 - 15   Glucose, capillary     Status: Abnormal   Collection Time: 07/19/15  7:34 AM  Result Value Ref Range   Glucose-Capillary 110 (H) 65 - 99 mg/dL   Comment 1 Notify RN    Comment 2 Document in Chart   Glucose, capillary     Status: Abnormal   Collection Time: 07/19/15 11:47 AM  Result Value Ref Range   Glucose-Capillary 193 (H) 65 - 99 mg/dL  Glucose, capillary     Status: Abnormal   Collection Time: 07/19/15  4:26 PM  Result Value Ref Range   Glucose-Capillary 184 (H) 65 - 99 mg/dL   Comment 1 Notify RN    Comment 2 Document in Chart   Glucose, capillary     Status: Abnormal   Collection Time: 07/19/15  9:48 PM  Result Value Ref Range   Glucose-Capillary 125 (H) 65 - 99 mg/dL  Glucose, capillary     Status: Abnormal   Collection Time: 07/20/15  3:46 AM  Result Value Ref Range   Glucose-Capillary 134 (H) 65 - 99 mg/dL  Protime-INR     Status: Abnormal   Collection Time: 07/20/15  4:23 AM  Result Value Ref Range   Prothrombin Time 55.8 (H) 11.6 - 15.2 seconds    Comment: RESULT REPEATED AND VERIFIED   INR 6.67 (HH) 0.00 - 1.49    Comment: RESULT REPEATED AND VERIFIED CRITICAL RESULT CALLED TO, READ BACK BY AND VERIFIED WITH: Carron Curie RN ON [id number] AT 0510 BY RESSEGGER R   CBC     Status: Abnormal   Collection Time: 07/20/15  4:23 AM  Result Value Ref Range   WBC 9.7 4.0 - 10.5 K/uL   RBC 4.04 (L) 4.22 - 5.81 MIL/uL   Hemoglobin 12.9 (L) 13.0 - 17.0 g/dL   HCT 39.0 39.0 - 52.0 %   MCV 96.5 78.0 - 100.0 fL   MCH 31.9 26.0 - 34.0 pg   MCHC 33.1 30.0 - 36.0 g/dL   RDW 13.4 11.5 - 15.5 %   Platelets 314 150 - 400 K/uL  Basic metabolic panel     Status: Abnormal   Collection Time: 07/20/15  4:23 AM  Result Value Ref Range   Sodium 144 135 - 145 mmol/L   Potassium 4.0 3.5 - 5.1 mmol/L   Chloride 109 101 - 111 mmol/L  CO2 28 22 - 32 mmol/L   Glucose, Bld 133 (H) 65 - 99 mg/dL   BUN 62 (H) 6 - 20 mg/dL   Creatinine, Ser 1.63 (H) 0.61 - 1.24 mg/dL   Calcium 8.6 (L)  8.9 - 10.3 mg/dL   GFR calc non Af Amer 38 (L) >60 mL/min   GFR calc Af Amer 44 (L) >60 mL/min    Comment: (NOTE) The eGFR has been calculated using the CKD EPI equation. This calculation has not been validated in all clinical situations. eGFR's persistently <60 mL/min signify possible Chronic Kidney Disease.    Anion gap 7 5 - 15  Glucose, capillary     Status: Abnormal   Collection Time: 07/20/15  7:49 AM  Result Value Ref Range   Glucose-Capillary 107 (H) 65 - 99 mg/dL    ABGS  Recent Labs  07/18/2015 1850  PHART 7.304*  PO2ART 264*  TCO2 23.7  HCO3 26.4*   CULTURES Recent Results (from the past 240 hour(s))  Blood Culture (routine x 2)     Status: None   Collection Time: 08/05/2015  6:33 PM  Result Value Ref Range Status   Specimen Description BLOOD RIGHT ARM  Final   Special Requests BOTTLES DRAWN AEROBIC AND ANAEROBIC 6CC  Final   Culture NO GROWTH 5 DAYS  Final   Report Status 07/19/2015 FINAL  Final  Blood Culture (routine x 2)     Status: None   Collection Time: 07/16/2015  6:37 PM  Result Value Ref Range Status   Specimen Description BLOOD RIGHT HAND  Final   Special Requests BOTTLES DRAWN AEROBIC AND ANAEROBIC 6CC  Final   Culture NO GROWTH 5 DAYS  Final   Report Status 07/19/2015 FINAL  Final  Culture, blood (routine x 2)     Status: None (Preliminary result)   Collection Time: 07/10/2015  5:05 PM  Result Value Ref Range Status   Specimen Description BLOOD RIGHT ARM  Final   Special Requests   Final    BOTTLES DRAWN AEROBIC AND ANAEROBIC AEB 6CC ANA 4CC   Culture NO GROWTH 2 DAYS  Final   Report Status PENDING  Incomplete  Culture, blood (routine x 2)     Status: None (Preliminary result)   Collection Time: 07/11/2015  5:10 PM  Result Value Ref Range Status   Specimen Description BLOOD RIGHT HAND  Final   Special Requests   Final    BOTTLES DRAWN AEROBIC AND ANAEROBIC AEB 6CC ANA 4CC   Culture NO GROWTH 2 DAYS  Final   Report Status PENDING  Incomplete   Urine culture     Status: None (Preliminary result)   Collection Time: 07/30/2015  6:13 PM  Result Value Ref Range Status   Specimen Description URINE, CATHETERIZED  Final   Special Requests NONE  Final   Culture   Final    NO GROWTH < 24 HOURS Performed at [facility]    Report Status PENDING  Incomplete  MRSA PCR Screening     Status: None   Collection Time: 07/18/15 12:00 PM  Result Value Ref Range Status   MRSA by PCR NEGATIVE NEGATIVE Final    Comment:        The GeneXpert MRSA Assay (FDA approved for NASAL specimens only), is one component of a comprehensive MRSA colonization surveillance program. It is not intended to diagnose MRSA infection nor to guide or monitor treatment for MRSA infections.    Studies/Results: Dg Chest Port [address]  07/19/2015   CLINICAL DATA:  81 year old male with a history of respiratory failure  EXAM: PORTABLE CHEST - 1 VIEW  COMPARISON:  08/01/2015, 07/15/2015  FINDINGS: Cardiomediastinal silhouette unchanged, obscured by overlying lung/pleural disease.  Surgical changes of median sternotomy.  Dense opacity at the right base obscuring the right hemidiaphragm in the right heart border.  Persistent left basilar opacity obscuring the left hemidiaphragm and portions of the left heart border.  Unchanged pacing device on left chest wall.  IMPRESSION: Similar appearance of the chest x-ray with right greater than left basilar opacity, potentially a combination of pleural effusion, consolidation, and/ or atelectasis.  Surgical changes of median sternotomy.  Signed,  Dulcy Fanny. Earleen Newport, DO  [facility]  [facility]   Electronically Signed   By: Corrie Mckusick D.O.   On: 07/19/2015 11:23    Medications:  Prior to Admission:  Prescriptions prior to admission  Medication Sig Dispense Refill Last Dose  . brimonidine-timolol (COMBIGAN) 0.2-0.5 % ophthalmic solution Place 1 drop into both eyes every 12 (twelve)  hours.   Past Week at Unknown time  . docusate sodium (COLACE) 100 MG capsule Take 1 capsule (100 mg total) by mouth every 12 (twelve) hours. 60 capsule 0 Past Week at Unknown time  . donepezil (ARICEPT) 10 MG tablet Take 1 tablet (10 mg total) by mouth every morning. 90 tablet 3 Past Week at Unknown time  . finasteride (PROSCAR) 5 MG tablet Take 5 mg by mouth every morning.    Past Week at Unknown time  . latanoprost (XALATAN) 0.005 % ophthalmic solution Place 1 drop into both eyes at bedtime.    Past Week at Unknown time  . memantine (NAMENDA) 10 MG tablet Take 1 tablet (10 mg total) by mouth 2 (two) times daily. 180 tablet 3 Past Week at Unknown time  . QUEtiapine (SEROQUEL) 50 MG tablet TAKE 2 TABLETS BY MOUTH DAILY AS DIRECTED (Patient taking differently: TAKE ONE TABLET BY MOUTH TWICE DAILY) 60 tablet 0 Past Week at Unknown time  . simvastatin (ZOCOR) 40 MG tablet Take 1 tablet (40 mg total) by mouth at bedtime. 90 tablet 3 Past Week at Unknown time  . warfarin (COUMADIN) 5 MG tablet TAKE AS INSTRUCTED BY ANTICOAGULATION CLINIC (Patient taking differently: TAKE AS INSTRUCTED BY ANTICOAGULATION CLINIC: 2.5MG BY MOUTH ON TUESDAYS, THURSDAYS, AND SATURDAYS, THEN TAKE 5MG ON ALL OTHER DAYS) 35 tablet 3 Past Week at Unknown time  . acetaminophen (TYLENOL) 500 MG tablet Take 500 mg by mouth every 6 (six) hours as needed for pain.    unknown  . amoxicillin-clavulanate (AUGMENTIN) 875-125 MG per tablet Take 1 tablet by mouth 2 (two) times daily. 14 tablet 0 never  . dextromethorphan-guaiFENesin (MUCINEX DM) 30-600 MG per 12 hr tablet Take 1 tablet by mouth 2 (two) times daily as needed for cough.    unknown   Scheduled: . antiseptic oral rinse  7 mL Mouth Rinse q12n4p  . brimonidine  1 drop Both Eyes Q12H   And  . timolol  1 drop Both Eyes BID  . chlorhexidine  15 mL Mouth Rinse BID  . docusate sodium  100 mg Oral BID  . donepezil  10 mg Oral Daily  . finasteride  5 mg Oral Daily  . insulin  aspart  0-9 Units Subcutaneous 6 times per day  . latanoprost  1 drop Both Eyes QHS  . levalbuterol  0.63 mg Nebulization Q6H WA  . memantine  10 mg Oral BID  .  methylPREDNISolone (SOLU-MEDROL) injection  40 mg Intravenous Q6H  . piperacillin-tazobactam (ZOSYN)  IV  3.375 g Intravenous Q8H  . QUEtiapine  50 mg Oral BID  . simvastatin  40 mg Oral QHS  . sodium chloride  3 mL Intravenous Q12H  . vancomycin  1,000 mg Intravenous Q24H  . Warfarin - Pharmacist Dosing Inpatient   Does not apply Q24H   Continuous:  [facility]:MOCAREQJEADGN **OR** acetaminophen, dextromethorphan-guaiFENesin, ondansetron **OR** ondansetron (ZOFRAN) IV  Assesment: He was admitted with acute hypoxic/hypercarbic respiratory failure and has improved. He had been on BiPAP but does not need that at this point. However he does have obstructive sleep apnea. He also appeared to have acute diastolic heart failure and he has chronic atrial fibrillation. He has healthcare associated pneumonia on broad-spectrum antibiotics and generally seems to be improving Principal Problem:   Acute respiratory failure with hypoxia and hypercarbia Active Problems:   Obstructive sleep apnea   Atrial fibrillation   Coronary artery disease  s/p CABG EF 55% 2011   Hyperglycemia   HCAP (healthcare-associated pneumonia)   Dementia   CKD (chronic kidney disease) stage 3, GFR 30-59 ml/min   Encephalopathy, metabolic   Acute respiratory failure   Acute diastolic CHF (congestive heart failure)    Plan: Continue with current treatments    LOS: 3 days   Tatym Schermer L 07/20/2015, 8:16 AM

## 2015-07-20 NOTE — Progress Notes (Signed)
ANTIBIOTIC CONSULT NOTE - FOLLOW UP  Pharmacy Consult for Vancomycin and Zosyn  Indication: rule out sepsis  No Known Allergies  Patient Measurements: Height: 6' (182.9 cm) Weight: 175 lb 14.8 oz (79.8 kg) IBW/kg (Calculated) : 77.6  Vital Signs: Temp: 97.8 F (36.6 C) (09/12 1000) BP: 156/76 mmHg (09/12 0800) Pulse Rate: 75 (09/12 1000) Intake/Output from previous day: 09/11 0701 - 09/12 0700 In: 2793 [P.O.:1500; I.V.:443; IV Piggyback:850] Out: 1125 [Urine:1125] Intake/Output from this shift: Total I/O In: -  Out: 400 [Urine:400]  Labs:  Recent Labs  08/01/2015 1705 07/18/15 0509 07/19/15 0531 07/20/15 0423  WBC 12.4* 12.7*  --  9.7  HGB 15.1 13.8  --  12.9*  PLT 464* 405*  --  314  CREATININE 1.41* 1.59* 1.97* 1.63*   Estimated Creatinine Clearance: 39 mL/min (by C-G formula based on Cr of 1.63). No results for input(s): VANCOTROUGH, VANCOPEAK, VANCORANDOM, GENTTROUGH, GENTPEAK, GENTRANDOM, TOBRATROUGH, TOBRAPEAK, TOBRARND, AMIKACINPEAK, AMIKACINTROU, AMIKACIN in the last 72 hours.   Microbiology: Recent Results (from the past 720 hour(s))  Blood Culture (routine x 2)     Status: None   Collection Time: 07/22/2015  6:33 PM  Result Value Ref Range Status   Specimen Description BLOOD RIGHT ARM  Final   Special Requests BOTTLES DRAWN AEROBIC AND ANAEROBIC 6CC  Final   Culture NO GROWTH 5 DAYS  Final   Report Status 07/19/2015 FINAL  Final  Blood Culture (routine x 2)     Status: None   Collection Time: 08/07/2015  6:37 PM  Result Value Ref Range Status   Specimen Description BLOOD RIGHT HAND  Final   Special Requests BOTTLES DRAWN AEROBIC AND ANAEROBIC 6CC  Final   Culture NO GROWTH 5 DAYS  Final   Report Status 07/19/2015 FINAL  Final  Culture, blood (routine x 2)     Status: None (Preliminary result)   Collection Time: 07/14/2015  5:05 PM  Result Value Ref Range Status   Specimen Description BLOOD RIGHT ARM  Final   Special Requests   Final    BOTTLES DRAWN  AEROBIC AND ANAEROBIC AEB 6CC ANA 4CC   Culture NO GROWTH 3 DAYS  Final   Report Status PENDING  Incomplete  Culture, blood (routine x 2)     Status: None (Preliminary result)   Collection Time: 07/29/2015  5:10 PM  Result Value Ref Range Status   Specimen Description BLOOD RIGHT HAND  Final   Special Requests   Final    BOTTLES DRAWN AEROBIC AND ANAEROBIC AEB [place] ANA 4CC   Culture NO GROWTH 3 DAYS  Final   Report Status PENDING  Incomplete  Urine culture     Status: None   Collection Time: 07/12/2015  6:13 PM  Result Value Ref Range Status   Specimen Description URINE, CATHETERIZED  Final   Special Requests NONE  Final   Culture   Final    NO GROWTH 2 DAYS Performed at [facility]    Report Status 07/20/2015 FINAL  Final  MRSA PCR Screening     Status: None   Collection Time: 07/18/15 12:00 PM  Result Value Ref Range Status   MRSA by PCR NEGATIVE NEGATIVE Final    Comment:        The GeneXpert MRSA Assay (FDA approved for NASAL specimens only), is one component of a comprehensive MRSA colonization surveillance program. It is not intended to diagnose MRSA infection nor to guide or monitor treatment for MRSA infections.  Anti-infectives    Start     Dose/Rate Route Frequency Ordered Stop   07/19/15 1000  vancomycin (VANCOCIN) IVPB 1000 mg/200 mL premix     1,000 mg 200 mL/hr over 60 Minutes Intravenous Every 24 hours 07/18/15 1043     07/18/15 0900  vancomycin (VANCOCIN) IVPB 750 mg/150 ml premix  Status:  Discontinued     750 mg 150 mL/hr over 60 Minutes Intravenous Every 12 hours 08/04/2015 2220 07/18/15 1043   07/18/15 0400  piperacillin-tazobactam (ZOSYN) IVPB 3.375 g     3.375 g 12.5 mL/hr over 240 Minutes Intravenous Every 8 hours 07/12/2015 2220     08/07/2015 1815  piperacillin-tazobactam (ZOSYN) IVPB 3.375 g     3.375 g 100 mL/hr over 30 Minutes Intravenous  Once 07/11/2015 1801 07/13/2015 1904   08/05/2015 1815  vancomycin (VANCOCIN) IVPB 1000 mg/200 mL premix      1,000 mg 200 mL/hr over 60 Minutes Intravenous  Once 08/01/2015 1801 07/18/2015 2004      Assessment: 81yo male with acute respiratory failure and HCAP.  Pt on Zosyn and Vancomycin.  Currently afebrile and WBC normalized.  Renal fxn stable.  Pt was recently discharge from hospital after treatment for pna.  Cultures (-) so far.   Goal of Therapy:  Vancomycin trough level 15-20 mcg/ml   Plan:   Zosyn 3.375gm IV every 8 hours. Follow-up micro data, labs, vitals.  Vancomycin 1000mg  IV q24hrs. Deescalate ABX when improved / appropriate Measure antibiotic drug levels at steady state Follow up culture results  Hart Robinsons A 07/20/2015,11:06 AM
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
## 2015-07-20 NOTE — Procedures (Signed)
Objective Swallowing Evaluation: Other (Comment) (MBSS)  Patient Details  Name: [name] MRN: [id number] Date of Birth: 1931-04-17  Today's Date: 07/20/2015 Time: SLP Start Time (ACUTE ONLY): 1403-SLP Stop Time (ACUTE ONLY): 1437 SLP Time Calculation (min) (ACUTE ONLY): 34 min  Past Medical History:  Past Medical History  Diagnosis Date  . Obstructive sleep apnea     noncompliant with CPAP  . Ischemic cardiomyopathy   . Atrial fibrillation     on anticoag  . AV block, complete s/p ablation   . Pacemaker [place]   . Cerebrovascular disease   . Hypercholesterolemia   . Hiatal hernia   . Gastritis   . Diverticulosis   . Colonic polyp   . BPH (benign prostatic hypertrophy)   . DJD (degenerative joint disease)   . Memory loss   . Anxiety   . Anemia   . Shingles   . Carotid artery occlusion     s/p R CEA 08/2003, s/p L CEA 06/2013  . Hypertension   . Pneumonia     hx of  . Parkinsonian features   . CAD (coronary artery disease) 2000    s/p CABG x 4 07/1999  . Glaucoma   . Radiation 11/20/14-01/07/15    right face/neck 60 gray  . 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 Pacemaker insertion    . Coronary artery bypass graft  07/1999    x4 by Dr. Jobie Quaker 9/00  . Carotid endarterectomy Right 08/2003    right 10/04 Dr. Scot Dock  . Anal fissure repair    . Hiatal hernia repair      incarcerated stomach with takedown and repair of hiatus and gastropexy  . Insert / replace / remove pacemaker    . Endarterectomy Left 06/27/2013    Procedure: Left Carotid Endarterectomy with [place]ines[place] patch angioplasty;  Surgeon: Angelia Mould, MD;  Location: [facility];  Service: Vascular;  Laterality: Left;  . Hernia repair     HPI:  Other Pertinent Information: [name] is a 81 y.o. male with history of dementia, CAD status post CABG, OSA, chronic atrial fibrillation, pacemaker placement who was  discharged to nursing home yesterday was brought back to the ER immediately upon reaching the nursing facility as patient was found to be in acute respiratory distress with altered mental status. In the ER ABG shows severe respiratory acidosis and was placed on BiPAP with repeat ABG showing improvement. Patient was found to be confused. CT head did not show anything acute. Chest x-ray shows bilateral infiltrates concerning for CHF versus pneumonia. Patient was given Lasix and started on empiric antibiotics and admitted for further management of acute hypercarbic and hypoxic respiratory failure. Pt has a history of right parotid cancer s/p radiation therapy completed in March of 2016 per wife. He also has an extensive history of esophageal dysphagia with dilation. SLP asked to evaluate swallow.  No Data Recorded  Assessment / Plan / Recommendation CHL IP CLINICAL IMPRESSIONS 07/20/2015  Therapy Diagnosis Mild oral phase dysphagia;Mild pharyngeal phase dysphagia  Clinical Impression Pt presents with overal mild to mild/moderate oropharyngeal phase dysphagia characterized by weak lingual movement for bolus manipulation, inefficient anterior posterior oral transit, delay in swallow initiation triggered after sitting in valleculae for several seconds, decreased tongue base retraction resulting in penetration of thins during the swallow (more significant with mixed consistencies) and penetration of nectars from residuals in  valleculae after the swallow. Although gross aspiration was not observed today, pt is at risk for aspiration given decreased respiratory support, weak cough, pharyngeal weakness with decreased sensation, and history of esophageal dysphagia. Pt is most at risk for aspirating thin liquids when presented as a mixed consistency (water with pills, cold cereal with milk, watermelon, broth soups with solid pieces, etc) and after the swallow from pooled residuals in the valleculae. Recommend D3/mech soft  with thin liquids with aspiration precautions; feed slowly and swallow 2x after each bite/sip. Unable to review results of MBS with family today, however risks for aspiration and negative sequelae of radiation therapy to neck discussed with pt/wife earlier today. Will follow pt for education and compensatory strategies during stay.       CHL IP TREATMENT RECOMMENDATION 07/20/2015  Treatment Recommendations Therapy as outlined in treatment plan below     CHL IP DIET RECOMMENDATION 07/20/2015  SLP Diet Recommendations Dysphagia 3 (Mech soft);Thin  Liquid Administration via Cup; no straw  Medication Administration Whole meds with puree  Compensations Multiple dry swallows after each bite/sip;Effortful swallow  Postural Changes and/or Swallow Maneuvers (None)     CHL IP OTHER RECOMMENDATIONS 07/20/2015  Recommended Consults (None)  Oral Care Recommendations Oral care BID  Other Recommendations Clarify dietary restrictions     No flowsheet data found.   CHL IP FREQUENCY AND DURATION 07/20/2015  Speech Therapy Frequency (ACUTE ONLY) min 2x/week  Treatment Duration 1 week     Pertinent Vitals/Pain VSS    SLP Swallow Goals Pt will demonstrate safe and efficient consumption of least restrictive diet with use of strategies as needed.   No flowsheet data found.    CHL IP REASON FOR REFERRAL 07/20/2015  Reason for Referral Objectively evaluate swallowing function     CHL IP ORAL PHASE 07/20/2015  Lips (None)  Tongue (None)  Mucous membranes (None)  Nutritional status (None)  Other (None)  Oxygen therapy (None)  Oral Phase Impaired  Oral - Pudding Teaspoon (None)  Oral - Pudding Cup (None)  Oral - Honey Teaspoon (None)  Oral - Honey Cup (None)  Oral - Honey Syringe (None)  Oral - Nectar Teaspoon (None)  Oral - Nectar Cup (None)  Oral - Nectar Straw (None)  Oral - Nectar Syringe (None)  Oral - Ice Chips (None)  Oral - Thin Teaspoon (None)  Oral - Thin Cup (None)  Oral - Thin Straw  (None)  Oral - Thin Syringe (None)  Oral - Puree (None)  Oral - Mechanical Soft (None)  Oral - Regular (None)  Oral - Multi-consistency (None)  Oral - Pill (None)  Oral Phase - Comment (None)      CHL IP PHARYNGEAL PHASE 07/20/2015  Pharyngeal Phase Impaired  Pharyngeal - Pudding Teaspoon (None)  Penetration/Aspiration details (pudding teaspoon) (None)  Pharyngeal - Pudding Cup (None)  Penetration/Aspiration details (pudding cup) (None)  Pharyngeal - Honey Teaspoon (None)  Penetration/Aspiration details (honey teaspoon) (None)  Pharyngeal - Honey Cup (None)  Penetration/Aspiration details (honey cup) (None)  Pharyngeal - Honey Syringe (None)  Penetration/Aspiration details (honey syringe) (None)  Pharyngeal - Nectar Teaspoon (None)  Penetration/Aspiration details (nectar teaspoon) (None)  Pharyngeal - Nectar Cup (None)  Penetration/Aspiration details (nectar cup) (None)  Pharyngeal - Nectar Straw (None)  Penetration/Aspiration details (nectar straw) (None)  Pharyngeal - Nectar Syringe (None)  Penetration/Aspiration details (nectar syringe) (None)  Pharyngeal - Ice Chips (None)  Penetration/Aspiration details (ice chips) (None)  Pharyngeal - Thin Teaspoon (None)  Penetration/Aspiration details (thin teaspoon) (None)  Pharyngeal - Thin Cup (None)  Penetration/Aspiration details (thin cup) (None)  Pharyngeal - Thin Straw (None)  Penetration/Aspiration details (thin straw) (None)  Pharyngeal - Thin Syringe (None)  Penetration/Aspiration details (thin syringe') (None)  Pharyngeal - Puree (None)  Penetration/Aspiration details (puree) (None)  Pharyngeal - Mechanical Soft (None)  Penetration/Aspiration details (mechanical soft) (None)  Pharyngeal - Regular (None)  Penetration/Aspiration details (regular) (None)  Pharyngeal - Multi-consistency (None)  Penetration/Aspiration details (multi-consistency) (None)  Pharyngeal - Pill (None)  Penetration/Aspiration details (pill)  (None)  Pharyngeal Comment (None)      CHL IP CERVICAL ESOPHAGEAL PHASE 07/20/2015  Cervical Esophageal Phase WFL  Pudding Teaspoon (None)  Pudding Cup (None)  Honey Teaspoon (None)  Honey Cup (None)  Honey Straw (None)  Nectar Teaspoon (None)  Nectar Cup (None)  Nectar Straw (None)  Nectar [place] Cup (None)  Thin Teaspoon (None)  Thin Cup (None)  Thin Straw (None)  Thin [place] Cup (None)  Cervical Esophageal Comment (None)   Thank you,  Genene Churn, [place]         Anjeanette Petzold 07/20/2015, 5:09 PM

## 2015-07-20 NOTE — Progress Notes (Signed)
CRITICAL VALUE ALERT  Critical value received:  PT/INR 55.8/6.67  Date of notification:  07/20/15  Time of notification: 0515  Critical value read back: yes  Nurse who received alert:  Candiss Norse RN  MD notified (1st page):  Dr. Sheran Luz   Time of first page:  0520  MD notified (2nd page):  Time of second page:  Responding MD:  Dr. Sheran Luz  Time MD responded:  [phone]  Hold off on Vitamin K and call Dr. Roderic Palau in the AM with results

## 2015-07-20 NOTE — Progress Notes (Signed)
Pt alert with family visiting at the bedside. VSS. IV patent. No complaints of any distress. Report called to Lattie Haw, Therapist, sports. Pt to be transferred to room 302 via bed with nursing staff.

## 2015-07-20 NOTE — Progress Notes (Signed)
[facility] PROGRESS NOTE  KOBEN DAMAN NWG:[id number] DOB: Nov 16, 1930 DOA: 07/12/2015 PCP: Olga Millers, MD  Assessment/Plan: 1. Acute respiratory failure with hypoxia and hypercapnia. Patient was recently discharged from the hospital after being treated for pneumonia. He had to return to the hospital the same day with worsening respiratory distress. ABG showed respiratory acidosis and hypoxia. He was started on BiPAP with improvement of ABG parameters. Clinically he is improving and has been weaned off bipap. Appreciate pulmonology assistance. Will continue current treatments. He will likely need QHS BiPAP on discharge. 2. Healthcare associated pneumonia/aspiration PNA. Patient started on vancomycin and Zosyn. We'll de-escalate antibiotic as his clinical condition improves. ? if he had an episode of aspiration with sudden decline in respiratory status. Will check swallow evaluation. 3. Acute diastolic CHF. Patient has elevated BNP. He was started on IV lasix, but became hypotensive and has rising creatinine. Clinically, his volume status appears to be better. Will have to monitor fluid status closely. Continue to hold off on further lasix for now. Continue to monitor intake and output. 4. Acute encephalopathy. Likely related to hypercapnia. Appears to be improving with BiPAP therapy. Continue current treatments.  5. Dementia. Patient has baseline dementia. Will continue Aricept and Namenda. 6. Chronic atrial fibrillation. Status post pacemaker. He is anticoagulated with Coumadin. 7. Acute on chronic CKD stage III. Creatinine is trending up with lasix but is now improving. Will continue to hold off on further lasix. Continue to monitor. 8. Demand ischemia. Mild elevation of troponins in the setting of acute illness. Echocardiogram is reassuring. 9. 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 Patient completed radiation therapy. Wife reports that he was not a  candidate for chemotherapy. 10. Coagulopathy. Patient is on coumadin and INR is supratherapeutic. No evidence of bleeding. Will continue to monitor. 11. Discussion. Will request palliative care consultation to discuss goals of care since he may have significant dysphagia, has weak cough effort and will need QHS BiPAP which will greatly impact his quality of life.  Code Status: DNR DVT Prophylaxis Coumadin Family Communication: discussed plan in detail with patient  Disposition Plan: pending hospital course   Consultants:  Pulmonology, Dr. Luan Pulling  Procedures:  Echo: - Left ventricle: The cavity size was normal. Systolic function was normal. The estimated ejection fraction was in the range of 50% to 55%. Wall motion was normal; there were no regional wall motion abnormalities. - Aortic valve: There was mild regurgitation. - Mitral valve: There was mild regurgitation. - Tricuspid valve: There was moderate regurgitation.  Antibiotics:  Vancomycin 9/9>>  Zosyn 9/9>>  HPI/Subjective: Feeling better. Unproductive cough. No chest pain.   Objective: Filed Vitals:   07/20/15 0600  BP: 101/56  Pulse: 70  Temp: 97.7 F (36.5 C)  Resp: 21    Intake/Output Summary (Last 24 hours) at 07/20/15 0643 Last data filed at 07/20/15 0600  Gross per 24 hour  Intake   2793 ml  Output   1125 ml  Net   1668 ml   Filed Weights   07/26/2015 2230 07/19/15 0500 07/20/15 0500  Weight: 78.1 kg (172 lb 2.9 oz) 79.1 kg (174 lb 6.1 oz) 79.8 kg (175 lb 14.8 oz)    Exam:   General:  Sitting up in bed, no distress, on nasal cannula  Cardiovascular: s1, s2, regular rate  Respiratory: coarse breath sounds bases bilaterally  Abdomen: soft, non tender, non distended, positive bowel sounds  Musculoskeletal: no pedal edema    Data Reviewed: Basic Metabolic  Panel:  Recent Labs Lab 07/16/15 0602 07/12/2015 1705 07/18/15 0509 07/19/15 0531 07/20/15 0423  NA 141 143 144 145 144  K  4.6 4.8 4.5 4.0 4.0  CL 105 109 107 109 109  CO2 27 27 26 28 28   GLUCOSE 112* 170* 127* 111* 133*  BUN 38* 45* 49* 56* 62*  CREATININE 1.42* 1.41* 1.59* 1.97* 1.63*  CALCIUM 9.3 9.4 9.6 8.8* 8.6*   Liver Function Tests:  Recent Labs Lab 07/30/2015 1705 07/18/15 0509  AST 27 24  ALT 12* 10*  ALKPHOS 91 84  BILITOT 0.6 0.5  PROT 7.1 6.5  ALBUMIN 2.8* 2.8*   CBC:  Recent Labs Lab 07/15/15 0615 07/16/15 0602 07/12/2015 1705 07/18/15 0509 07/20/15 0423  WBC 7.8 9.7 12.4* 12.7* 9.7  NEUTROABS  --   --  10.9* 10.6*  --   HGB 13.0 14.2 15.1 13.8 12.9*  HCT 39.4 43.9 46.6 43.7 39.0  MCV 95.9 96.1 99.4 98.4 96.5  PLT 261 335 464* 405* 314   Cardiac Enzymes:  Recent Labs Lab 07/15/15 1058 08/02/2015 1705 07/31/2015 2233 07/18/15 0509 07/18/15 1040  TROPONINI <0.03 0.08* 0.08* 0.04* 0.04*   BNP (last 3 results)  Recent Labs  07/10/2015 1715 07/18/2015 1705  BNP 133.0* 1580.0*    ProBNP (last 3 results)  CBG:  Recent Labs Lab 07/19/15 0734 07/19/15 1147 07/19/15 1626 07/19/15 2148 07/20/15 0346  GLUCAP 110* 193* 184* 125* 134*    Recent Results (from the past 240 hour(s))  Blood Culture (routine x 2)     Status: None   Collection Time: 07/09/2015  6:33 PM  Result Value Ref Range Status   Specimen Description BLOOD RIGHT ARM  Final   Special Requests BOTTLES DRAWN AEROBIC AND ANAEROBIC 6CC  Final   Culture NO GROWTH 5 DAYS  Final   Report Status 07/19/2015 FINAL  Final  Blood Culture (routine x 2)     Status: None   Collection Time: 08/05/2015  6:37 PM  Result Value Ref Range Status   Specimen Description BLOOD RIGHT HAND  Final   Special Requests BOTTLES DRAWN AEROBIC AND ANAEROBIC 6CC  Final   Culture NO GROWTH 5 DAYS  Final   Report Status 07/19/2015 FINAL  Final  Culture, blood (routine x 2)     Status: None (Preliminary result)   Collection Time: 07/16/2015  5:05 PM  Result Value Ref Range Status   Specimen Description BLOOD RIGHT ARM  Final   Special  Requests   Final    BOTTLES DRAWN AEROBIC AND ANAEROBIC AEB 6CC ANA 4CC   Culture NO GROWTH 2 DAYS  Final   Report Status PENDING  Incomplete  Culture, blood (routine x 2)     Status: None (Preliminary result)   Collection Time: 07/26/2015  5:10 PM  Result Value Ref Range Status   Specimen Description BLOOD RIGHT HAND  Final   Special Requests   Final    BOTTLES DRAWN AEROBIC AND ANAEROBIC AEB 6CC ANA 4CC   Culture NO GROWTH 2 DAYS  Final   Report Status PENDING  Incomplete  Urine culture     Status: None (Preliminary result)   Collection Time: 07/16/2015  6:13 PM  Result Value Ref Range Status   Specimen Description URINE, CATHETERIZED  Final   Special Requests NONE  Final   Culture   Final    NO GROWTH < 24 HOURS Performed at [facility]    Report Status PENDING  Incomplete  MRSA PCR  Screening     Status: None   Collection Time: 07/18/15 12:00 PM  Result Value Ref Range Status   MRSA by PCR NEGATIVE NEGATIVE Final    Comment:        The GeneXpert MRSA Assay (FDA approved for NASAL specimens only), is one component of a comprehensive MRSA colonization surveillance program. It is not intended to diagnose MRSA infection nor to guide or monitor treatment for MRSA infections.      Studies: Dg Chest Port 1 View  07/19/2015   CLINICAL DATA:  81 year old male with a history of respiratory failure  EXAM: PORTABLE CHEST - 1 VIEW  COMPARISON:  08/05/2015, 07/28/2015  FINDINGS: Cardiomediastinal silhouette unchanged, obscured by overlying lung/pleural disease.  Surgical changes of median sternotomy.  Dense opacity at the right base obscuring the right hemidiaphragm in the right heart border.  Persistent left basilar opacity obscuring the left hemidiaphragm and portions of the left heart border.  Unchanged pacing device on left chest wall.  IMPRESSION: Similar appearance of the chest x-ray with right greater than left basilar opacity, potentially a combination of pleural effusion,  consolidation, and/ or atelectasis.  Surgical changes of median sternotomy.  Signed,  Dulcy Fanny. Earleen Newport, DO  [facility]  [facility]   Electronically Signed   By: Corrie Mckusick D.O.   On: 07/19/2015 11:23    Scheduled Meds: . antiseptic oral rinse  7 mL Mouth Rinse q12n4p  . brimonidine  1 drop Both Eyes Q12H   And  . timolol  1 drop Both Eyes BID  . chlorhexidine  15 mL Mouth Rinse BID  . docusate sodium  100 mg Oral BID  . donepezil  10 mg Oral Daily  . finasteride  5 mg Oral Daily  . insulin aspart  0-9 Units Subcutaneous 6 times per day  . latanoprost  1 drop Both Eyes QHS  . levalbuterol  0.63 mg Nebulization Q6H WA  . memantine  10 mg Oral BID  . methylPREDNISolone (SOLU-MEDROL) injection  40 mg Intravenous Q6H  . phytonadione (VITAMIN K) IV  1 mg Intravenous Once  . piperacillin-tazobactam (ZOSYN)  IV  3.375 g Intravenous Q8H  . QUEtiapine  50 mg Oral BID  . simvastatin  40 mg Oral QHS  . sodium chloride  3 mL Intravenous Q12H  . vancomycin  1,000 mg Intravenous Q24H  . Warfarin - Pharmacist Dosing Inpatient   Does not apply Q24H   Continuous Infusions:   Principal Problem:   Acute respiratory failure with hypoxia and hypercarbia Active Problems:   Obstructive sleep apnea   Atrial fibrillation   Coronary artery disease  s/p CABG EF 55% 2011   Hyperglycemia   HCAP (healthcare-associated pneumonia)   Dementia   CKD (chronic kidney disease) stage 3, GFR 30-59 ml/min   Encephalopathy, metabolic   Acute respiratory failure   Acute diastolic CHF (congestive heart failure)    Time spent: 35 minutes  By signing my name below, I, Rhett Bannister attest that this documentation has been prepared under the direction and in the presence of Dr. Kathie Dike, M.D.   Electronically signed: Rhett Bannister  07/20/2015   Kathie Dike, M.D.  [facility] Pager [phone]. If 7PM-7AM, please contact night-coverage at  [email or web address], password [facility] 07/20/2015, 6:43 AM  LOS: 3 days     I have reviewed the above documentation for accuracy and completeness, and I agree with the above.  Nyasiah Moffet

## 2015-07-20 NOTE — Progress Notes (Signed)
Patient has bipap ordered for QHS. Patient was recently moved to floor, asked patient if he wanted to try to wear a machine and he said no. I took a cpap machine to him and he said he would try to wear. Patient put on a auto cpap with a max of 10 and a min of 5. 3L of 02 is being bled through. Rt will continue to monitor.

## 2015-07-20 NOTE — Progress Notes (Signed)
ANTICOAGULATION CONSULT NOTE - Follow Up   Pharmacy Consult for Coumadin (chronic Rx PTA) Indication: atrial fibrillation  No Known Allergies  Patient Measurements: Height: 6' (182.9 cm) Weight: 175 lb 14.8 oz (79.8 kg) IBW/kg (Calculated) : 77.6  Vital Signs: Temp: 97.8 F (36.6 C) (09/12 1000) BP: 156/76 mmHg (09/12 0800) Pulse Rate: 75 (09/12 1000)  Labs:  Recent Labs  07/29/2015 1705 07/18/2015 2233 07/18/15 0509 07/18/15 1040 07/19/15 0531 07/20/15 0423  HGB 15.1  --  13.8  --   --  12.9*  HCT 46.6  --  43.7  --   --  39.0  PLT 464*  --  405*  --   --  314  LABPROT 38.6*  --  39.7*  --  50.6* 55.8*  INR 4.09*  --  4.23*  --  5.85* 6.67*  CREATININE 1.41*  --  1.59*  --  1.97* 1.63*  TROPONINI 0.08* 0.08* 0.04* 0.04*  --   --    Estimated Creatinine Clearance: 39 mL/min (by C-G formula based on Cr of 1.63).  Medications:  Prescriptions prior to admission  Medication Sig Dispense Refill Last Dose  . brimonidine-timolol (COMBIGAN) 0.2-0.5 % ophthalmic solution Place 1 drop into both eyes every 12 (twelve) hours.   Past Week at Unknown time  . docusate sodium (COLACE) 100 MG capsule Take 1 capsule (100 mg total) by mouth every 12 (twelve) hours. 60 capsule 0 Past Week at Unknown time  . donepezil (ARICEPT) 10 MG tablet Take 1 tablet (10 mg total) by mouth every morning. 90 tablet 3 Past Week at Unknown time  . finasteride (PROSCAR) 5 MG tablet Take 5 mg by mouth every morning.    Past Week at Unknown time  . latanoprost (XALATAN) 0.005 % ophthalmic solution Place 1 drop into both eyes at bedtime.    Past Week at Unknown time  . memantine (NAMENDA) 10 MG tablet Take 1 tablet (10 mg total) by mouth 2 (two) times daily. 180 tablet 3 Past Week at Unknown time  . QUEtiapine (SEROQUEL) 50 MG tablet TAKE 2 TABLETS BY MOUTH DAILY AS DIRECTED (Patient taking differently: TAKE ONE TABLET BY MOUTH TWICE DAILY) 60 tablet 0 Past Week at Unknown time  . simvastatin (ZOCOR) 40 MG tablet  Take 1 tablet (40 mg total) by mouth at bedtime. 90 tablet 3 Past Week at Unknown time  . warfarin (COUMADIN) 5 MG tablet TAKE AS INSTRUCTED BY ANTICOAGULATION CLINIC (Patient taking differently: TAKE AS INSTRUCTED BY ANTICOAGULATION CLINIC: 2.5MG  BY MOUTH ON TUESDAYS, THURSDAYS, AND SATURDAYS, THEN TAKE 5MG  ON ALL OTHER DAYS) 35 tablet 3 Past Week at Unknown time  . acetaminophen (TYLENOL) 500 MG tablet Take 500 mg by mouth every 6 (six) hours as needed for pain.    unknown  . amoxicillin-clavulanate (AUGMENTIN) 875-125 MG per tablet Take 1 tablet by mouth 2 (two) times daily. 14 tablet 0 never  . dextromethorphan-guaiFENesin (MUCINEX DM) 30-600 MG per 12 hr tablet Take 1 tablet by mouth 2 (two) times daily as needed for cough.    unknown    Assessment: 81yo male on chronic Coumadin PTA for h/o afib.  INR SUPRAtherapeutic on admission and remains elevated despite no Coumadin given while in hospital.   CBC stable, MD aware of INR. Records from Coumadin clinic noted.    Anticoagulation Monitoring 07/08/2015  INR goal 2.0-3.0  Assoc. INR Date 07/08/2015  Associated INR 3.3  Pt. deviation No  Current weekly dose 27.5 mg  Sunday dose 5 mg  Monday dose 5 mg  Tuesday dose 2.5 mg  Wednesday dose 5 mg  Thursday dose 2.5 mg  Friday dose 5 mg  Saturday dose 2.5 mg  Weekly dose 27.5 mg  Dose description Skip today's dose, then continue taking 1 tablet (5mg ) every day except 1/2 tablet (2.5mg ) on Tuesdays Thursdays and Saturdays. Recheck in 3 weeks.  Return date 07/30/2015  VISIT REPORT     Goal of Therapy:  INR 2-3   Plan:  HOLD coumadin again today INR daily  Hart Robinsons A 07/20/2015,11:03 AM

## 2015-07-20 NOTE — Progress Notes (Signed)
OFF BiPAP on 3lpm /[place] patient did well.

## 2015-07-20 NOTE — Evaluation (Signed)
Clinical/Bedside Swallow Evaluation Patient Details  Name: Ricky [name] MRN: [id number] Date of Birth: 1931/08/13  Today's Date: 07/20/2015 Time: SLP Start Time (ACUTE ONLY): 6644 SLP Stop Time (ACUTE ONLY): 1340 SLP Time Calculation (min) (ACUTE ONLY): 36 min  Past Medical History:  Past Medical History  Diagnosis Date  . Obstructive sleep apnea     noncompliant with CPAP  . Ischemic cardiomyopathy   . Atrial fibrillation     on anticoag  . AV block, complete s/p ablation   . Pacemaker [place]   . Cerebrovascular disease   . Hypercholesterolemia   . Hiatal hernia   . Gastritis   . Diverticulosis   . Colonic polyp   . BPH (benign prostatic hypertrophy)   . DJD (degenerative joint disease)   . Memory loss   . Anxiety   . Anemia   . Shingles   . Carotid artery occlusion     s/p R CEA 08/2003, s/p L CEA 06/2013  . Hypertension   . Pneumonia     hx of  . Parkinsonian features   . CAD (coronary artery disease) 2000    s/p CABG x 4 07/1999  . Glaucoma   . Radiation 11/20/14-01/07/15    right face/neck 60 gray  . 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 Pacemaker insertion    . Coronary artery bypass graft  07/1999    x4 by Dr. Jobie Quaker 9/00  . Carotid endarterectomy Right 08/2003    right 10/04 Dr. Scot Dock  . Anal fissure repair    . Hiatal hernia repair      incarcerated stomach with takedown and repair of hiatus and gastropexy  . Insert / replace / remove pacemaker    . Endarterectomy Left 06/27/2013    Procedure: Left Carotid Endarterectomy with Finesse patch angioplasty;  Surgeon: Angelia Mould, MD;  Location: [facility];  Service: Vascular;  Laterality: Left;  . Hernia repair     HPI:  Ricky [name] is a 81 y.o. male with history of dementia, CAD status post CABG, OSA, chronic atrial fibrillation, pacemaker placement who was discharged to nursing home yesterday was brought  back to the ER immediately upon reaching the nursing facility as patient was found to be in acute respiratory distress with altered mental status. In the ER ABG shows severe respiratory acidosis and was placed on BiPAP with repeat ABG showing improvement. Patient was found to be confused. CT head did not show anything acute. Chest x-ray shows bilateral infiltrates concerning for CHF versus pneumonia. Patient was given Lasix and started on empiric antibiotics and admitted for further management of acute hypercarbic and hypoxic respiratory failure. Pt has a history of right parotid cancer s/p radiation therapy completed in March of 2016 per wife. He also has an extensive history of esophageal dysphagia with dilation. SLP asked to evaluate swallow.   Assessment / Plan / Recommendation Clinical Impression  [name] likely presents with suspected multifactorial oropharyngeal dysphagia due to history of esophageal dysphagia with dilation, hiatal hernia, decreased respiratory support, and s/p radiation therapy to right neck resulting in reduced facial movement on the right side, suspected reduced hyolaryngeal excursion, delay in swallow initiation, and suspected overall weak pharyngeal response. Pt with pooling of secretions and audible congestion with throat clearing, coughing, and during respiration. Recommend MBSS to identify appropriate diet and strategies due to suspected high risk for aspiration. Family  in agreement. MBSS will be completed this afternoon.     Aspiration Risk  Moderate    Diet Recommendation  Pending MBSS       Other  Recommendations     Follow Up Recommendations       Frequency and Duration        Pertinent Vitals/Pain O2 via      Swallow Study Prior Functional Status       General Date of Onset: 07/22/2015 Other Pertinent Information: Ricky [name] is a 81 y.o. male with history of dementia, CAD status post CABG, OSA, chronic atrial fibrillation, pacemaker placement who  was discharged to nursing home yesterday was brought back to the ER immediately upon reaching the nursing facility as patient was found to be in acute respiratory distress with altered mental status. In the ER ABG shows severe respiratory acidosis and was placed on BiPAP with repeat ABG showing improvement. Patient was found to be confused. CT head did not show anything acute. Chest x-ray shows bilateral infiltrates concerning for CHF versus pneumonia. Patient was given Lasix and started on empiric antibiotics and admitted for further management of acute hypercarbic and hypoxic respiratory failure. Pt has a history of right parotid cancer s/p radiation therapy completed in March of 2016 per wife. He also has an extensive history of esophageal dysphagia with dilation. SLP asked to evaluate swallow. Type of Study: Bedside swallow evaluation Previous Swallow Assessment: None on record Diet Prior to this Study: Regular;Thin liquids Temperature Spikes Noted: No Respiratory Status: Supplemental O2 delivered via (comment) (also using Bi-PAP) History of Recent Intubation: No Behavior/Cognition: Alert;Cooperative;Requires cueing Oral Cavity - Dentition:  (dentures) Self-Feeding Abilities: Needs assist Patient Positioning: Upright in bed Baseline Vocal Quality: Wet;Low vocal intensity Volitional Cough: Weak;Congested Volitional Swallow: Able to elicit    Oral/Motor/Sensory Function Overall Oral Motor/Sensory Function: Impaired (s/p radiation therapy right side) Labial ROM: Reduced right Labial Symmetry: Abnormal symmetry right Labial Strength: Reduced Labial Sensation: Within Functional Limits Lingual ROM: Within Functional Limits Lingual Symmetry: Within Functional Limits Lingual Strength: Within Functional Limits Lingual Sensation: Within Functional Limits Facial ROM: Reduced right Facial Symmetry: Within Functional Limits Facial Strength: Reduced Facial Sensation: Within Functional Limits Velum:  Within Functional Limits Mandible: Within Functional Limits   Ice Chips Ice chips: Within functional limits Presentation: Spoon   Thin Liquid Thin Liquid: Impaired Presentation: Cup;Straw Pharyngeal  Phase Impairments: Suspected delayed Swallow;Throat Clearing - Delayed;Cough - Delayed    Nectar Thick Nectar Thick Liquid: Within functional limits Presentation: Cup;Straw   Honey Thick Honey Thick Liquid: Not tested   Puree Puree: Impaired Presentation: Self Fed;Spoon Pharyngeal Phase Impairments: Suspected delayed Swallow;Throat Clearing - Delayed;Cough - Delayed   Solid   Thank you,  Genene Churn, CCC-SLP [phone]     Solid: Not tested       Carrieanne Kleen 07/20/2015,2:21 PM

## 2015-07-21 DIAGNOSIS — I5031 Acute diastolic (congestive) heart failure: Secondary | ICD-10-CM

## 2015-07-21 DIAGNOSIS — F0390 Unspecified dementia without behavioral disturbance: Secondary | ICD-10-CM

## 2015-07-21 LAB — GLUCOSE, CAPILLARY
GLUCOSE-CAPILLARY: 120 mg/dL — AB (ref 65–99)
Glucose-Capillary: 124 mg/dL — ABNORMAL HIGH (ref 65–99)
Glucose-Capillary: 129 mg/dL — ABNORMAL HIGH (ref 65–99)
Glucose-Capillary: 158 mg/dL — ABNORMAL HIGH (ref 65–99)
Glucose-Capillary: 195 mg/dL — ABNORMAL HIGH (ref 65–99)

## 2015-07-21 LAB — BASIC METABOLIC PANEL
Anion gap: 9 (ref 5–15)
BUN: 60 mg/dL — AB (ref 6–20)
CALCIUM: 9.1 mg/dL (ref 8.9–10.3)
CHLORIDE: 107 mmol/L (ref 101–111)
CO2: 30 mmol/L (ref 22–32)
CREATININE: 1.61 mg/dL — AB (ref 0.61–1.24)
GFR calc non Af Amer: 38 mL/min — ABNORMAL LOW (ref >60)
GFR, EST AFRICAN AMERICAN: 45 mL/min — AB (ref >60)
GLUCOSE: 129 mg/dL — AB (ref 65–99)
Potassium: 4.3 mmol/L (ref 3.5–5.1)
Sodium: 146 mmol/L — ABNORMAL HIGH (ref 135–145)

## 2015-07-21 LAB — PROTIME-INR
INR: 1.35 (ref 0.00–1.49)
PROTHROMBIN TIME: 16.8 s — AB (ref 11.6–15.2)

## 2015-07-21 MED ORDER — WARFARIN SODIUM 5 MG PO TABS: 5.0000 mg | ORAL_TABLET | Freq: Once | ORAL | Status: DC

## 2015-07-22 LAB — CULTURE, BLOOD (ROUTINE X 2)
Culture: NO GROWTH
Culture: NO GROWTH

## 2015-07-23 ENCOUNTER — Ambulatory Visit: Payer: Medicare Other | Admitting: Nurse Practitioner

## 2015-07-24 ENCOUNTER — Ambulatory Visit: Payer: Medicare Other | Admitting: Adult Health

## 2015-07-30 ENCOUNTER — Ambulatory Visit: Payer: Medicare Other | Admitting: Radiation Oncology

## 2015-08-05 ENCOUNTER — Telehealth: Payer: Self-pay | Admitting: *Deleted

## 2015-08-06 NOTE — Telephone Encounter (Signed)
  Oncology Nurse Navigator Documentation   Navigator Encounter Type: Telephone (08/05/15 1449)     Spoke with patient's wife, expressed condolences at the recent passing of her husband. She recapped events leading up to and during his recent hospitalization. She noted his dementia seemed to be progressively worsening over the past months, indicated this made it more challenging for her to care for him.  "I did the best I could for him." I recognized the loving support she provided during his weeks of RT. She expressed appreciation for the care provided at [facility], my navigation, my phone call.  Gayleen Orem, RN, BSN, [place] [facility] [phone]                     Time Spent with Patient: 45 (08/05/15 1449)

## 2015-08-08 NOTE — Discharge Summary (Addendum)
Death Summary  CORTAVIUS MONTESINOS JGG:[id number] DOB: 05/29/1931 DOA: 2015-08-12  PCP: Olga Millers, MD  Admit date: 2015-08-12 Date of Death: 16-Aug-2015  Final Diagnoses:  Principal Problem:   Acute respiratory failure with hypoxia and hypercarbia Active Problems:   Obstructive sleep apnea   Atrial fibrillation   Coronary artery disease  s/p CABG EF 55% 2011   Hyperglycemia   Aspiration pneumonia   Dementia   CKD (chronic kidney disease) stage 3, GFR 30-59 ml/min   Encephalopathy, metabolic   Acute respiratory failure   Acute diastolic CHF (congestive heart failure)   Acute encephalopathy   Palliative care encounter   DNR (do not resuscitate) discussion   History of present illness:  This is an 81 y/o male with a history of dementia, who was recently discharged to SNF after treatment for pneumonia. He had been doing well at the time of discharge, but on arrival to SNF was noted to be in respiratory distress and was brought back to ED for evaluation. He was noted to be in acute respiratory failure with hypoxia and hypercapnea and required bipap.  Hospital Course:  It was felt that his acute deterioration in respiratory status was caused by aspiration. He was started on broad spectrum antibiotics and was followed by pulmonology. He initially improved with bipap therapy and was able to tolerate time off bipap. He was seen by speech therapy and noted to have significant dysphagia. Recommendations were mechanical soft diet with thin liquids. Palliative care followed patient to discuss goals of care. With his high risk of aspiration, bipap qhs and modified diet, quality of life was discussed. His wife and son in law elected to pursue a comfort approach and requested that the patient be discharged to residential hospice. On 16-Aug-2023, patient was eating ice cream and acute aspirated. He became increasingly short of breath and hypoxic. Attempts were made for suctioning, but patient did not improve. His  respiratory distress progressed until he went into cardiopulmonary arrest. He was not resuscitated since he was a DNR. He was pronounced dead at 17:20. Family was offered support.   Time: 43mins  Signed:  Antoney Biven  [facility] 08-16-2015, 6:26 PM

## 2015-08-08 NOTE — Progress Notes (Signed)
ANTICOAGULATION CONSULT NOTE - Follow Up   Pharmacy Consult for Coumadin (chronic Rx PTA) Indication: atrial fibrillation  No Known Allergies  Patient Measurements: Height: 6' (182.9 cm) Weight: 174 lb 11.2 oz (79.243 kg) IBW/kg (Calculated) : 77.6  Vital Signs: Temp: 98 F (36.7 C) (09/13 0456) Temp Source: Oral (09/13 0456) BP: 164/87 mmHg (09/13 0456)  Labs:  Recent Labs  07/18/15 1040 07/19/15 0531 07/20/15 0423 08-01-15 0533  HGB  --   --  12.9*  --   HCT  --   --  39.0  --   PLT  --   --  314  --   LABPROT  --  50.6* 55.8* 16.8*  INR  --  5.85* 6.67* 1.35  CREATININE  --  1.97* 1.63* 1.61*  TROPONINI 0.04*  --   --   --    Estimated Creatinine Clearance: 39.5 mL/min (by C-G formula based on Cr of 1.61).  Medications:  Prescriptions prior to admission  Medication Sig Dispense Refill Last Dose  . brimonidine-timolol (COMBIGAN) 0.2-0.5 % ophthalmic solution Place 1 drop into both eyes every 12 (twelve) hours.   Past Week at Unknown time  . docusate sodium (COLACE) 100 MG capsule Take 1 capsule (100 mg total) by mouth every 12 (twelve) hours. 60 capsule 0 Past Week at Unknown time  . donepezil (ARICEPT) 10 MG tablet Take 1 tablet (10 mg total) by mouth every morning. 90 tablet 3 Past Week at Unknown time  . finasteride (PROSCAR) 5 MG tablet Take 5 mg by mouth every morning.    Past Week at Unknown time  . latanoprost (XALATAN) 0.005 % ophthalmic solution Place 1 drop into both eyes at bedtime.    Past Week at Unknown time  . memantine (NAMENDA) 10 MG tablet Take 1 tablet (10 mg total) by mouth 2 (two) times daily. 180 tablet 3 Past Week at Unknown time  . QUEtiapine (SEROQUEL) 50 MG tablet TAKE 2 TABLETS BY MOUTH DAILY AS DIRECTED (Patient taking differently: TAKE ONE TABLET BY MOUTH TWICE DAILY) 60 tablet 0 Past Week at Unknown time  . simvastatin (ZOCOR) 40 MG tablet Take 1 tablet (40 mg total) by mouth at bedtime. 90 tablet 3 Past Week at Unknown time  . warfarin  (COUMADIN) 5 MG tablet TAKE AS INSTRUCTED BY ANTICOAGULATION CLINIC (Patient taking differently: TAKE AS INSTRUCTED BY ANTICOAGULATION CLINIC: 2.5MG  BY MOUTH ON TUESDAYS, THURSDAYS, AND SATURDAYS, THEN TAKE 5MG  ON ALL OTHER DAYS) 35 tablet 3 Past Week at Unknown time  . acetaminophen (TYLENOL) 500 MG tablet Take 500 mg by mouth every 6 (six) hours as needed for pain.    unknown  . amoxicillin-clavulanate (AUGMENTIN) 875-125 MG per tablet Take 1 tablet by mouth 2 (two) times daily. 14 tablet 0 never  . dextromethorphan-guaiFENesin (MUCINEX DM) 30-600 MG per 12 hr tablet Take 1 tablet by mouth 2 (two) times daily as needed for cough.    unknown    Assessment: 81yo male on chronic Coumadin PTA for h/o afib.  INR SUPRAtherapeutic on admission.  INR today is 1.35 after Vit K 1mg  IV given yesterday   Goal: INR 2-3   Plan:  Coumadin 5 mg po today INR daily  Jeffery Bachmeier [place] 08/01/2015,9:29 AM

## 2015-08-08 NOTE — Care Management Note (Signed)
Case Management Note  Patient Details  Name: Ricky [name] MRN: [id number] Date of Birth: 05-03-1931   Expected Discharge Date:    07/23/2015              Expected Discharge Plan:  [place]  In-House Referral:  Clinical Social Work, Hospice / Palliative Care  Discharge planning Services  CM Consult  Post Acute Care Choice:    Choice offered to:     DME Arranged:    DME Agency:     HH Arranged:    HH Agency:     Status of Service:  In process, will continue to follow  Medicare Important Message Given:  Yes-second notification given Date Medicare IM Given:    Medicare IM give by:    Date Additional Medicare IM Given:    Additional Medicare Important Message give by:     If discussed at [place] of Stay Meetings, dates discussed:    Additional Comments: Pt recently discharged from hospital to [facility] and readmitted same day for resp failure. CSW is aware and plan for return to facility at DC. Palliative care has been consulted and working with family. DC to hospice medical facility is possible, family has not yet made decision. CSW and Palliative cont to work with patient to determine most appropriate DC plan. No CM needs noted.  Sherald Barge, RN July 24, 2015, 12:45 PM

## 2015-08-08 NOTE — Progress Notes (Signed)
Daily Progress Note   Patient Name: Ricky [name]       Date: 08-12-2015 DOB: 22-Jul-1931  Age: 81 y.o. MRN#: [id number] Attending Physician: Kathie Dike, MD Primary Care Physician: Olga Millers, MD Admit Date: 07/16/2015  Reason for Consultation/Follow-up: Establishing goals of care and Psychosocial/spiritual support  Subjective: Ricky [name] is resting quietly in bed today.  He looks at me when I enter.  We talk briefly about his swallow study and my worries about his breathing.  He tells me that he has "been waiting for them to come get me".   I ask if he means to go to heaven, and he says "yes".  We talk about the Hospice home in [place] and I ask if he would like to go there.  He states, "yeah, I guess."  I tell him this is a big decision.  His wife, Avelino Leeds, and son in law, Araceli Bouche arrive at this time.  I share with them what [name] has said.  He tells his wife that he is "tired and wore out".  Araceli Bouche states his goal is comfort and that he would like to see [name] go to the Hospice home.  We talk about stopping antibiotics and starting medications to help with pain and dyspnea.  Family agrees.  Mrs. Wintle states she also wants comfort and wants [name] to go to the Hospice home.  I share with them that SW will talk with them and Hospice will possibly admit him tomorrow.    Length of Stay: 4 days  Current Medications: Scheduled Meds:  . antiseptic oral rinse  7 mL Mouth Rinse q12n4p  . brimonidine  1 drop Both Eyes Q12H   And  . timolol  1 drop Both Eyes BID  . chlorhexidine  15 mL Mouth Rinse BID  . docusate sodium  100 mg Oral BID  . donepezil  10 mg Oral Daily  . finasteride  5 mg Oral Daily  . insulin aspart  0-9 Units Subcutaneous 6 times per day  . latanoprost  1 drop Both Eyes QHS  . levalbuterol  0.63 mg Nebulization Q6H WA  . memantine  10 mg Oral BID  . methylPREDNISolone (SOLU-MEDROL) injection  40 mg Intravenous Q6H  . piperacillin-tazobactam (ZOSYN)  IV   3.375 g Intravenous Q8H  . QUEtiapine  50 mg Oral BID  . simvastatin  40 mg Oral QHS  . sodium chloride  3 mL Intravenous Q12H  . vancomycin  1,000 mg Intravenous Q24H  . warfarin  5 mg Oral Once  . Warfarin - Pharmacist Dosing Inpatient   Does not apply Q24H    Continuous Infusions:    PRN Meds: acetaminophen **OR** acetaminophen, dextromethorphan-guaiFENesin, ondansetron **OR** ondansetron (ZOFRAN) IV  Palliative Performance Scale: 30%     Vital Signs: BP 167/102 mmHg  Pulse 77  Temp(Src) 97.8 F (36.6 C) (Oral)  Resp 20  Ht 6' (1.829 m)  Wt 79.243 kg (174 lb 11.2 oz)  BMI 23.69 kg/m2  SpO2 96% SpO2: SpO2: 96 % O2 Device: O2 Device: Nasal Cannula O2 Flow Rate: O2 Flow Rate (L/min): 3 L/min  Intake/output summary:  Intake/Output Summary (Last 24 hours) at August 12, 2015 1526 Last data filed at 2015/08/12 1335  Gross per 24 hour  Intake    590 ml  Output    650 ml  Net    -60 ml   LBM:   Baseline Weight: Weight: 78.1 kg (172 lb 2.9 oz) Most recent weight: Weight: 79.243  kg (174 lb 11.2 oz)  Physical Exam: Constitutional: Elderly frail, lying in bed.  Resp: Some work of breathing noted. Weak, productive cough.  Abd: Soft, rounded. No grimace with palpation.             Additional Data Reviewed: Recent Labs     07/20/15  0423  20-Aug-2015  0533  WBC  9.7   --   HGB  12.9*   --   PLT  314   --   NA  144  146*  BUN  62*  60*  CREATININE  1.63*  1.61*     Problem List:  Patient Active Problem List   Diagnosis Date Noted  . Acute encephalopathy   . Palliative care encounter   . DNR (do not resuscitate) discussion   . Respiratory failure 07/25/2015  . Acute respiratory failure with hypoxia and hypercarbia 07/13/2015  . CKD (chronic kidney disease) stage 3, GFR 30-59 ml/min 07/11/2015  . Encephalopathy, metabolic 17/00/1749  . Acute respiratory failure 07/10/2015  . Acute diastolic CHF (congestive heart failure) 08/03/2015  . HCAP (healthcare-associated  pneumonia) 07/17/2015  . PNA (pneumonia) 07/19/2015  . Chronic anticoagulation 07/28/2015  . Chest pain 07/09/2015  . Dementia 07/24/2015  . Confusion 08/07/2015  . Cancer of parotid gland 11/14/2014  . Hyperglycemia 04/07/2014  . 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 Occlusion and stenosis of carotid artery without mention of cerebral infarction 11/21/2012  . Hypertension 08/31/2012  . Pacemaker-single-chamber [employer] 08/31/2011  . Coronary artery disease  s/p CABG EF 55% 2011 06/27/2011  . BENIGN PROSTATIC HYPERTROPHY, WITH OBSTRUCTION 08/14/2009  . Atrial fibrillation 05/12/2009  . CEREBROVASCULAR DISEASE 05/25/2008  . ANEMIA 11/08/2007  . HYPERCHOLESTEROLEMIA 09/24/2007  . Obstructive sleep apnea 09/24/2007  . DEGENERATIVE JOINT DISEASE 09/24/2007     Palliative Care Assessment & Plan    Code Status:  DNR  Goals of Care:  Hospice in patient  Symptom Management:  Tylenol 650 mg PO/PR Q 6 hours PRN, Zofran 4 mg PO/IV Q 6 hours PRN.    Palliative Prophylaxis:   Colace 100 mg PO BID.  Psycho-social/Spiritual:  Desire for further Chaplaincy support:no   Prognosis: < 2 weeks Discharge Planning: Hospice facility   Care plan was discussed with nursing staff, CM, SW and Dr. Roderic Palau.   Thank you for allowing the Palliative Medicine Team to assist in the care of this patient.   Time In:  1350 Time Out: 1430 Total Time  40 minutes Prolonged Time Billed  no     Greater than 50%  of this time was spent counseling and coordinating care related to the above assessment and plan.   Drue Novel, NP  08-20-2015, 3:26 PM  Please contact Palliative Medicine Team phone at [phone] for questions and concerns.

## 2015-08-08 NOTE — Clinical Social Work Note (Signed)
CSW left a message for Mrs. Dowding to return contact.   Ihor Gully, [place] [phone]

## 2015-08-08 NOTE — Care Management Important Message (Signed)
Important Message  Patient Details  Name: Ricky [name] MRN: [id number] Date of Birth: 20-Jan-1931   Medicare Important Message Given:  Yes-second notification given    Sherald Barge, RN 2015-07-23, 12:45 PM

## 2015-08-08 NOTE — Progress Notes (Signed)
Called to patient's room by wife who says patient is having trouble breathing, patient found lethargic, but arousable with gurgling sounds, patient suctioned with minimal returns of thick, white sputum, wife stated she just fed him a container of vanilla ice cream and he started coughing.  Dr. Roderic Palau arrived in the room to assess patient, oxygen level 38% on 3L/[place], patient's respirations went agonal within 5 minutes after suctioning, emotional support given to wife at bedside, patient expired, Dr. Roderic Palau pronounced.

## 2015-08-08 DEATH — deceased

## 2015-11-17 ENCOUNTER — Ambulatory Visit: Payer: Self-pay | Admitting: Internal Medicine

## 2015-12-07 IMAGING — CR DG CHEST 1V PORT
1 series · 1 of 1 positions shown · non-contrast
Comparison: Chest x-ray of 06/25/2013

CLINICAL DATA: Chest pain for 1 week, cough, history of head neck
carcinoma

EXAM:
PORTABLE CHEST - 1 VIEW

[ap portable]
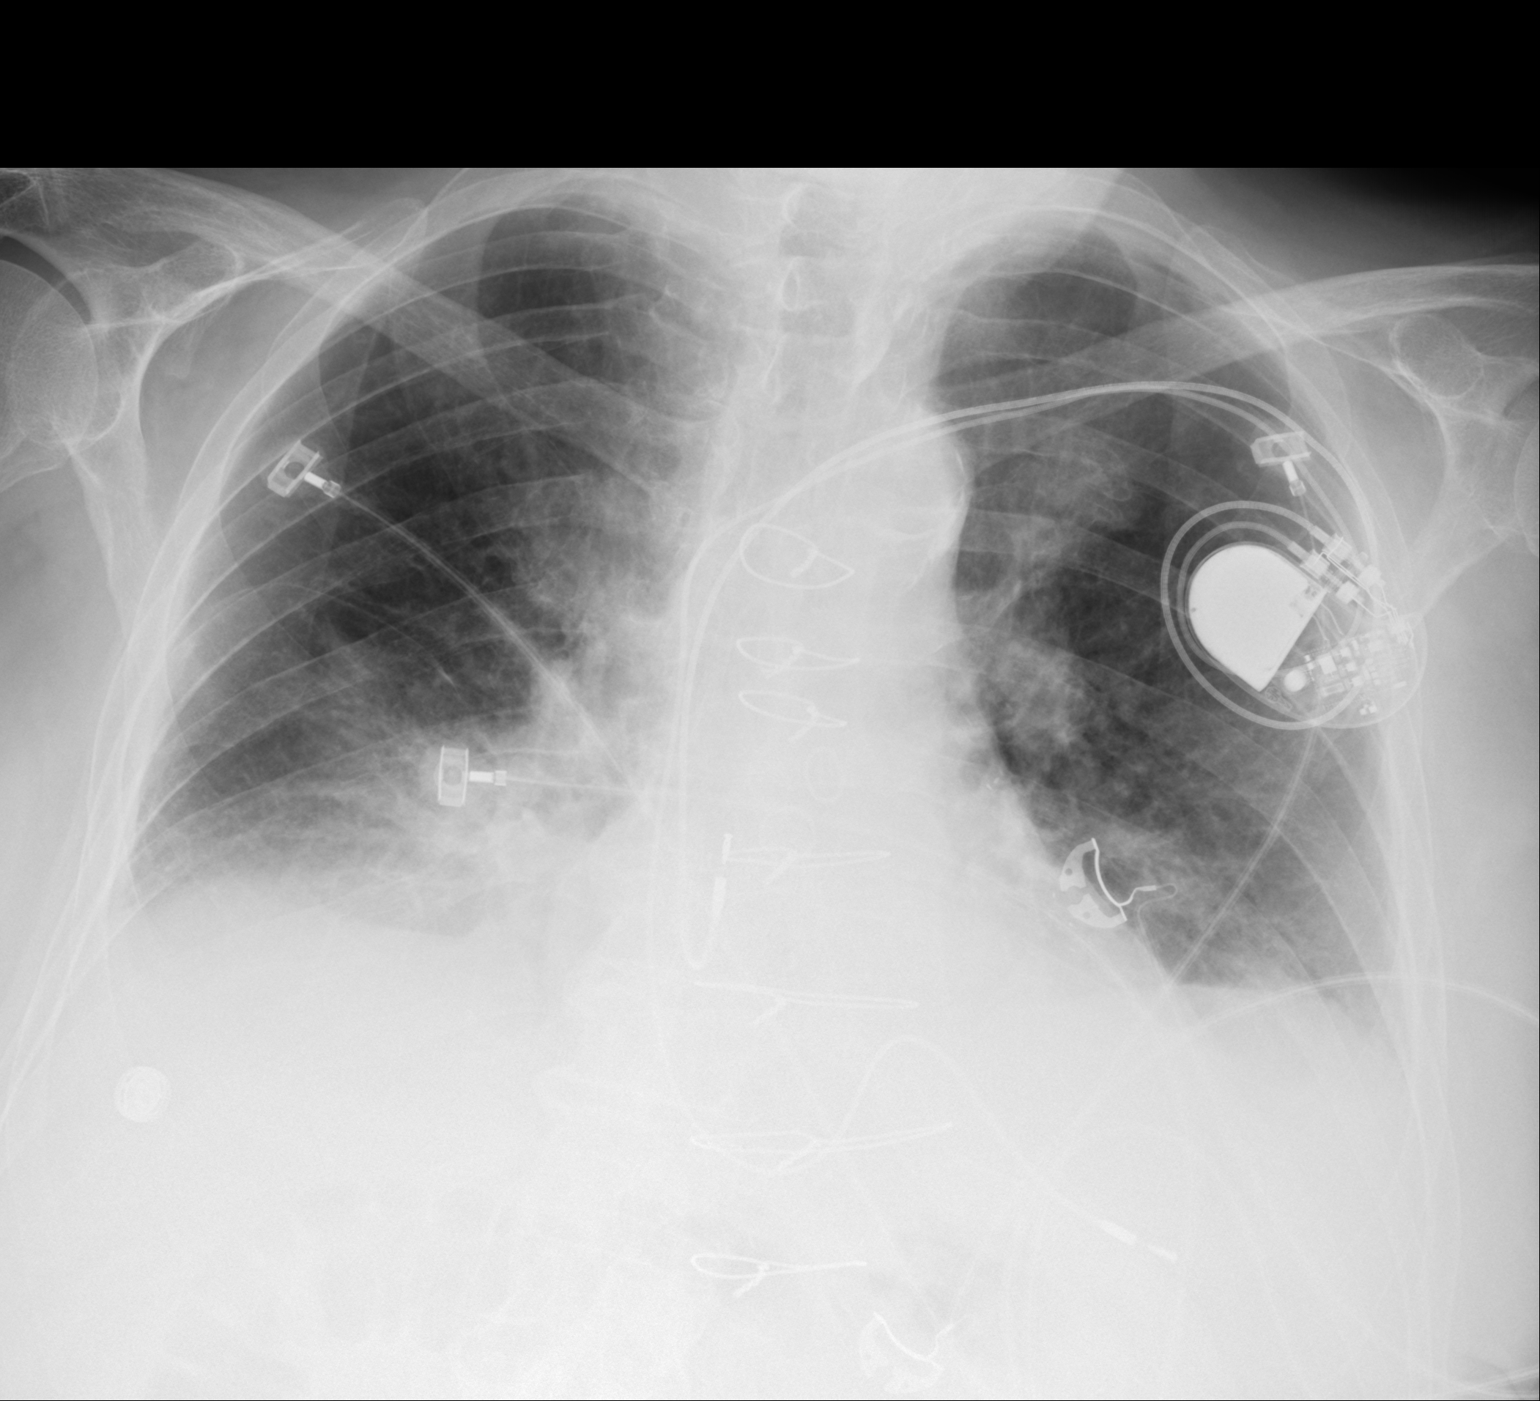

[1 of 1 positions shown; findings below may reference images not displayed]

FINDINGS: The lungs are poorly aerated. There is haziness at both lung bases
right greater than left and possibly in the medial left upper lung
field suspicious for multifocal pneumonia as well as volume loss. No
definite pleural effusion is seen. Heart size is stable and a dual
lead permanent pacemaker remains. Median sternotomy sutures are
noted.
IMPRESSION: Poor inspiration with bibasilar opacities and possible medial left
upper lung opacity. Some of these changes may be due to atelectasis
but pneumonia cannot be excluded.

## 2015-12-10 IMAGING — CR DG CHEST 1V PORT
1 series · 1 of 1 positions shown · non-contrast
Comparison: July 14, 2015

CLINICAL DATA: Shortness of breath with decreased oxygen saturation

EXAM:
PORTABLE CHEST - 1 VIEW

[ap portable]
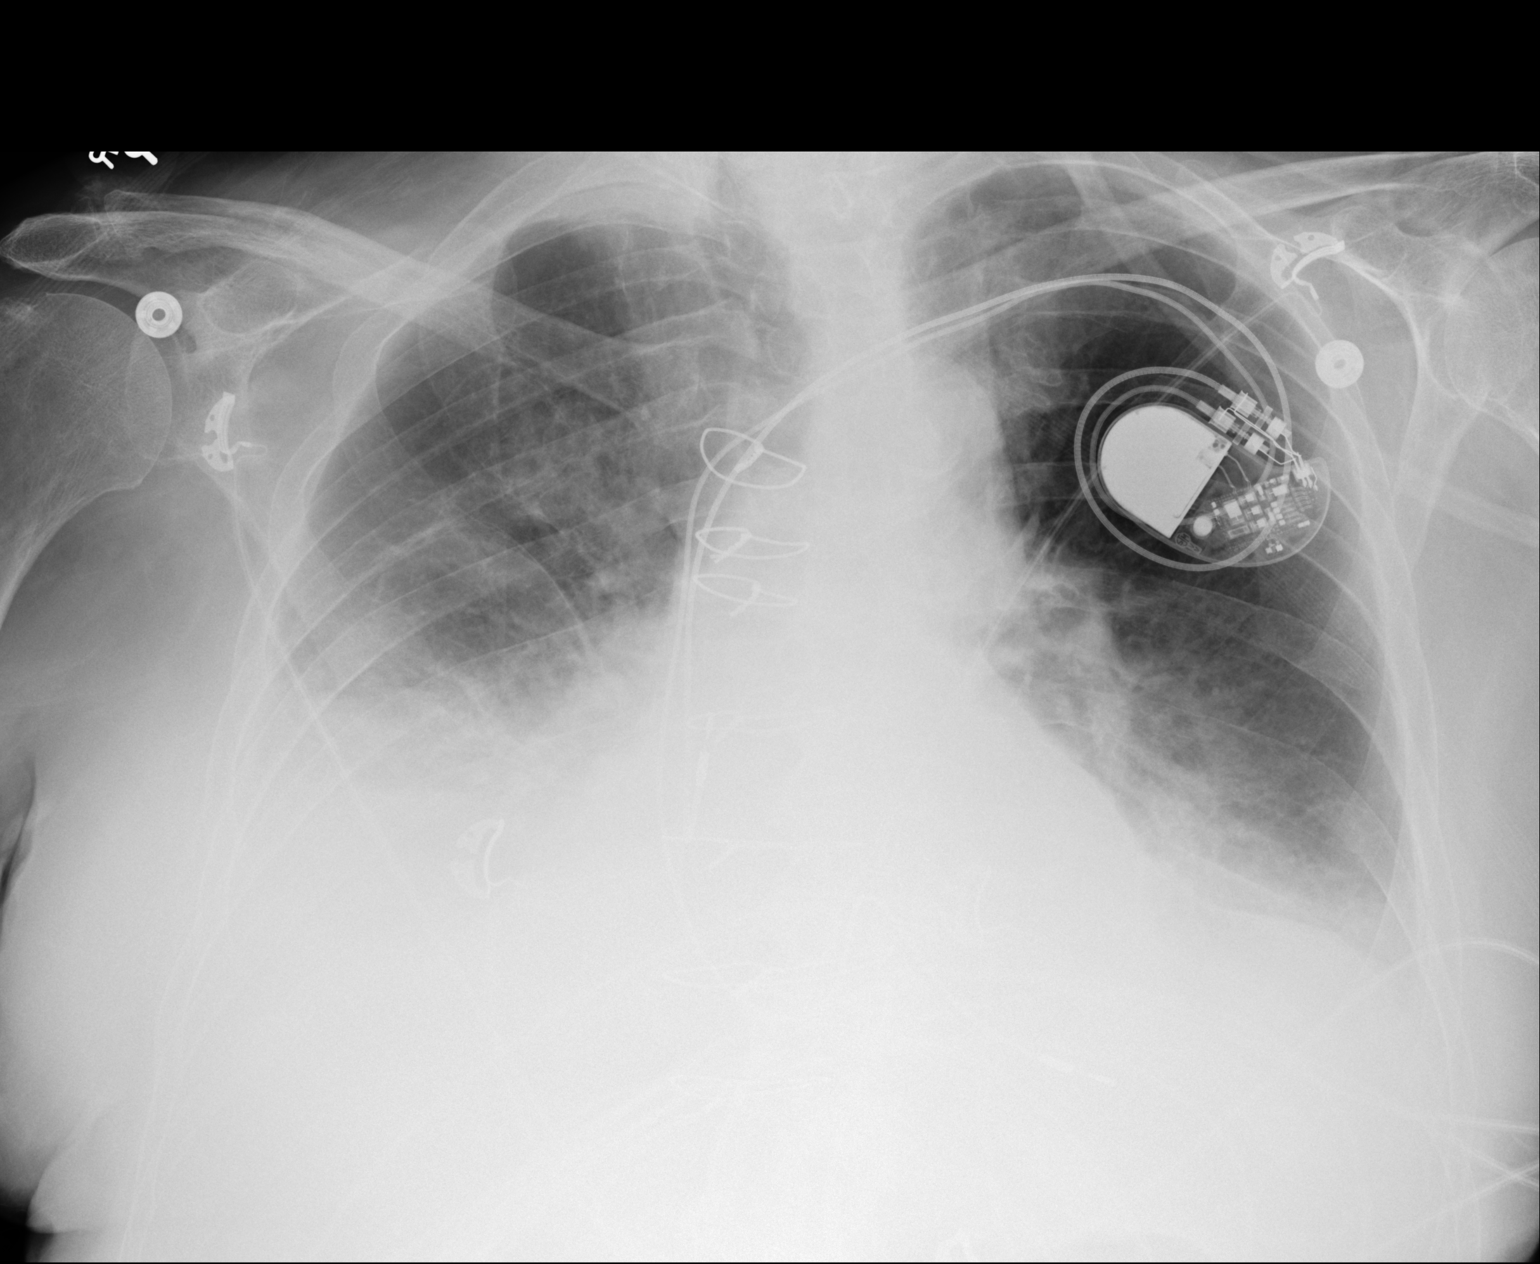

[1 of 1 positions shown; findings below may reference images not displayed]

FINDINGS: There is airspace consolidation in both lung bases, essentially
stable. There is a small effusion on the right. Heart is mildly
enlarged with pulmonary vascularity within normal limits. Pacemaker
leads are attached to the right atrium and right ventricle. No
adenopathy. Patient is status post median sternotomy.
IMPRESSION: Bibasilar airspace consolidation. Cardiomegaly with small effusion.
All of these findings may be indicative of congestive heart failure.
Superimposed pneumonia in the bases, however, cannot be excluded.
Both entities may exist concurrently.

## 2015-12-12 IMAGING — CR DG CHEST 1V PORT
1 series · 1 of 1 positions shown · non-contrast
Comparison: 07/17/2015, 07/14/2015

CLINICAL DATA: 83-year-old male with a history of respiratory
failure

EXAM:
PORTABLE CHEST - 1 VIEW

[ap portable]
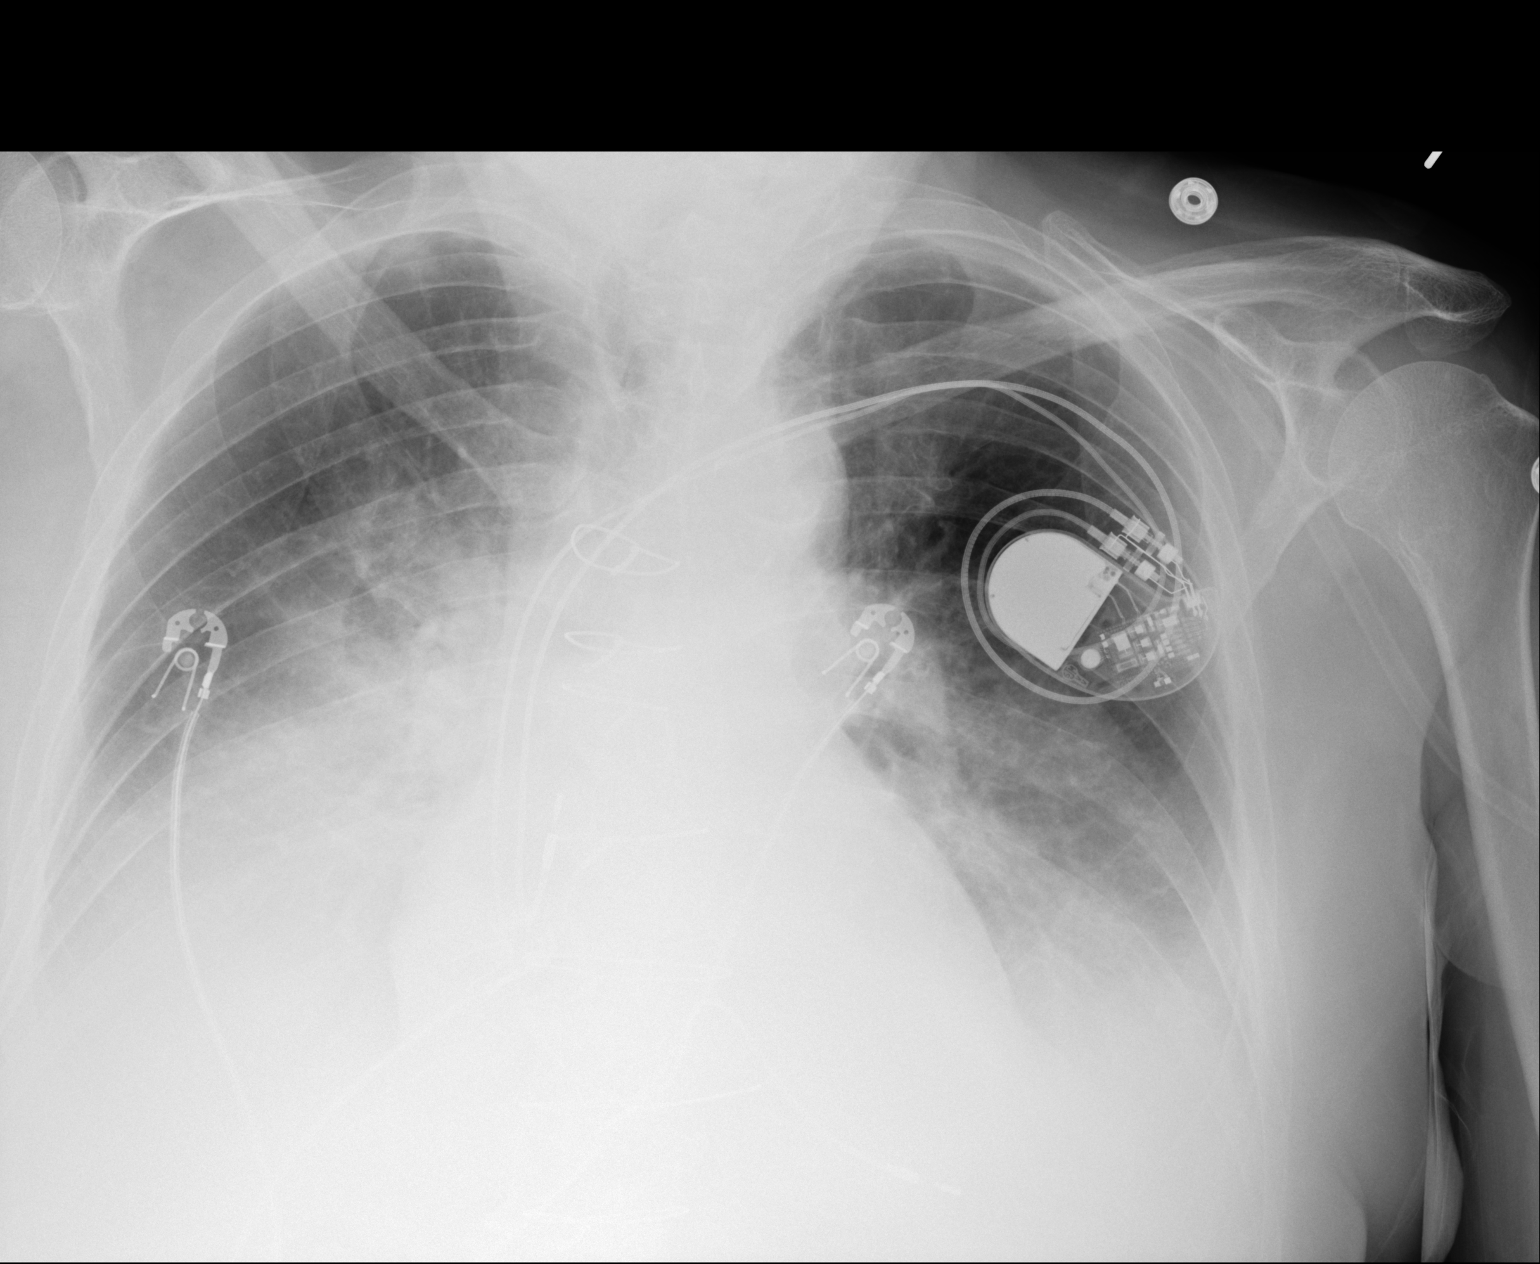

[1 of 1 positions shown; findings below may reference images not displayed]

FINDINGS: Cardiomediastinal silhouette unchanged, obscured by overlying
lung/pleural disease.

Surgical changes of median sternotomy.

Dense opacity at the right base obscuring the right hemidiaphragm in
the right heart border.

Persistent left basilar opacity obscuring the left hemidiaphragm and
portions of the left heart border.

Unchanged pacing device on left chest wall.
IMPRESSION: Similar appearance of the chest x-ray with right greater than left
basilar opacity, potentially a combination of pleural effusion,
consolidation, and/ or atelectasis.

Surgical changes of median sternotomy.
